# Patient Record
Sex: Male | Born: 1955 | Race: White | Hispanic: No | Marital: Married | Smoking: Former smoker
Health system: Southern US, Community
[De-identification: ages and names within clinical notes are randomized; demographics above are authoritative.]

## PROBLEM LIST (undated history)

## (undated) DIAGNOSIS — N2889 Other specified disorders of kidney and ureter: Secondary | ICD-10-CM

## (undated) DIAGNOSIS — C801 Malignant (primary) neoplasm, unspecified: Secondary | ICD-10-CM

## (undated) DIAGNOSIS — Z8739 Personal history of other diseases of the musculoskeletal system and connective tissue: Secondary | ICD-10-CM

## (undated) DIAGNOSIS — K5730 Diverticulosis of large intestine without perforation or abscess without bleeding: Secondary | ICD-10-CM

## (undated) DIAGNOSIS — Z9889 Other specified postprocedural states: Secondary | ICD-10-CM

## (undated) DIAGNOSIS — J410 Simple chronic bronchitis: Secondary | ICD-10-CM

## (undated) DIAGNOSIS — R112 Nausea with vomiting, unspecified: Secondary | ICD-10-CM

## (undated) DIAGNOSIS — R300 Dysuria: Secondary | ICD-10-CM

## (undated) DIAGNOSIS — Z973 Presence of spectacles and contact lenses: Secondary | ICD-10-CM

## (undated) DIAGNOSIS — R319 Hematuria, unspecified: Secondary | ICD-10-CM

## (undated) DIAGNOSIS — Z972 Presence of dental prosthetic device (complete) (partial): Secondary | ICD-10-CM

## (undated) HISTORY — PX: WISDOM TOOTH EXTRACTION: SHX21

## (undated) HISTORY — PX: HERNIA REPAIR: SHX51

## (undated) HISTORY — PX: CARPAL TUNNEL RELEASE: SHX101

---

## 1995-07-10 HISTORY — PX: SHOULDER SURGERY: SHX246

## 2006-01-07 ENCOUNTER — Ambulatory Visit (HOSPITAL_COMMUNITY): Admission: RE | Admit: 2006-01-07 | Discharge: 2006-01-07 | Payer: Self-pay | Admitting: Orthopaedic Surgery

## 2007-09-18 ENCOUNTER — Ambulatory Visit (HOSPITAL_COMMUNITY): Admission: RE | Admit: 2007-09-18 | Discharge: 2007-09-18 | Payer: Self-pay | Admitting: Family Medicine

## 2010-11-24 NOTE — Op Note (Signed)
NAME:  Michael Brady, Michael Brady NO.:  [id number]   MEDICAL RECORD NO.:  [id number]          PATIENT TYPE:  AMB   LOCATION:  SDS                          FACILITY:  [facility]   PHYSICIAN:  Mark C. Ophelia Charter, M.D.    DATE OF BIRTH:  May 06, 1956   DATE OF PROCEDURE:  01/07/2006  DATE OF DISCHARGE:                                 OPERATIVE REPORT   PREOPERATIVE DIAGNOSIS:  Left hand crush injury with FPL laceration.   POSTOPERATIVE DIAGNOSIS:  Stenosing tenosynovitis left thumb.   PROCEDURE:  Tenolysis, A1 pulley release, and carpal tunnel release, left  hand.   SURGEON:  Mark C. Ophelia Charter, M.D.   ANESTHESIA:  GOT.   ASSISTANT:  Ninfa Linden, P.A.-C.   TOURNIQUET TIME:  Less than 30 minutes.   This 55 year old male was injured on December 24, 2005, when he had a severe  crush injury and had a fracture of the distal metacarpal, second, involving  the articular surface which was reduced and fixed with percutaneous screw  fixation.  He was sent to the hand therapist for a splint.  It was noted at  that time the patient did not have FPL excursion.  Just presented with the  laceration over the thenar eminence, had cut the FPL somehow outside the  machinery.  He had been examined previously by me but it was not noted that  the FPL ws not working.  Neurovascularly he was intact with intact two-point  sensation and closure was done at an outpatient urgent care who had not  noticed significant bleeding, which should be expected with superficial arch  connection off the radial artery above the FPL tendon due to the location of  the laceration.   The hand was prepped up to the tourniquet in preparation for possible tendon  transfer if needed.  Carpal incision was done first.  Median nerve was  evaluated.  There was a distal branch at the takeoff of the thenar motor  branch.  There was extensive scar tissue present in the canal and FPL was  identified, pulled on what they closed, hemostat  looping around it and  immediately there was excursion at the IP joint of the thumb.  This was  pulled slightly harder, there was some slight triggering or catching  sensation.  Incision was made over the A1 pulley with a V-shaped incision,  and A1 pulley was released.  There was some scar tissue pressure on the  tendon and once this was performed, when the FPL was pulled in the carpal  canal, the thumb would flex at the IP joint and then would extend on its own  without catching or triggering.  There was noted scar tissue around the  index profundus also with the wrist flexed and extended.  It appeared that  the DIP of the index finger remains straight.  This tendon was grasped by  hooking underneath it, the rag now applying tension without crushing or  grabbing the tendon and the DIP joint of the index finger had normal  excursion.  After this was pulled and some scar  tissue was released from the  adjacent tendon, the fingers all had a normal cascade.  The wounds were  irrigated.  The old thenar laceration did not have to be opened.  The wounds  were irrigated and tourniquet deflated.  Hemostasis had been obtained going  in with bipolar cautery and limited bipolar __________ operative field.  Simple nylon sutures were placed in the skin.  Xeroform, Klinger's and a  splint with the wrist in extension.  MCPs at the long and index finger were  flexed at 90 degrees, DIP straight and thumb was left so he could have  excursion.  In the recovery room, the patient was able to demonstrate some  excursion, although the FPL still had some mild difficulty flexing more than  40 degrees.  He appeared to still have some problems with initiation  secondary to the crush.  The patient will be discharged home and followed up  with the hand therapist later this week.      Mark C. Ophelia Charter, M.D.  Electronically Signed     MCY/MEDQ  D:  01/07/2006  T:  01/07/2006  Job:  [id number]

## 2014-06-08 DIAGNOSIS — Z8739 Personal history of other diseases of the musculoskeletal system and connective tissue: Secondary | ICD-10-CM

## 2014-06-08 HISTORY — DX: Personal history of other diseases of the musculoskeletal system and connective tissue: Z87.39

## 2014-06-30 ENCOUNTER — Emergency Department (HOSPITAL_COMMUNITY)
Admission: EM | Admit: 2014-06-30 | Discharge: 2014-07-01 | Disposition: A | Discharge status: Home / Self Care | Payer: 59 | Attending: Emergency Medicine | Admitting: Emergency Medicine

## 2014-06-30 ENCOUNTER — Emergency Department (HOSPITAL_COMMUNITY): Payer: 59

## 2014-06-30 ENCOUNTER — Encounter (HOSPITAL_COMMUNITY): Payer: Self-pay | Admitting: *Deleted

## 2014-06-30 DIAGNOSIS — R52 Pain, unspecified: Secondary | ICD-10-CM

## 2014-06-30 DIAGNOSIS — Y9389 Activity, other specified: Secondary | ICD-10-CM | POA: Insufficient documentation

## 2014-06-30 DIAGNOSIS — Y998 Other external cause status: Secondary | ICD-10-CM | POA: Insufficient documentation

## 2014-06-30 DIAGNOSIS — Y92019 Unspecified place in single-family (private) house as the place of occurrence of the external cause: Secondary | ICD-10-CM | POA: Insufficient documentation

## 2014-06-30 DIAGNOSIS — Z880 Allergy status to penicillin: Secondary | ICD-10-CM | POA: Diagnosis not present

## 2014-06-30 DIAGNOSIS — S4992XA Unspecified injury of left shoulder and upper arm, initial encounter: Secondary | ICD-10-CM | POA: Diagnosis present

## 2014-06-30 DIAGNOSIS — Z791 Long term (current) use of non-steroidal anti-inflammatories (NSAID): Secondary | ICD-10-CM | POA: Diagnosis not present

## 2014-06-30 DIAGNOSIS — S43015A Anterior dislocation of left humerus, initial encounter: Secondary | ICD-10-CM

## 2014-06-30 DIAGNOSIS — S43016A Anterior dislocation of unspecified humerus, initial encounter: Secondary | ICD-10-CM | POA: Insufficient documentation

## 2014-06-30 DIAGNOSIS — Z720 Tobacco use: Secondary | ICD-10-CM | POA: Insufficient documentation

## 2014-06-30 DIAGNOSIS — W19XXXA Unspecified fall, initial encounter: Secondary | ICD-10-CM

## 2014-06-30 DIAGNOSIS — W108XXA Fall (on) (from) other stairs and steps, initial encounter: Secondary | ICD-10-CM | POA: Insufficient documentation

## 2014-06-30 LAB — CBC WITH DIFFERENTIAL/PLATELET
Basophils Absolute: 0 10*3/uL (ref 0.0–0.1)
Basophils Relative: 0 % (ref 0–1)
EOS PCT: 1 % (ref 0–5)
Eosinophils Absolute: 0.1 10*3/uL (ref 0.0–0.7)
HEMATOCRIT: 45.4 % (ref 39.0–52.0)
HEMOGLOBIN: 15.1 g/dL (ref 13.0–17.0)
LYMPHS ABS: 3.9 10*3/uL (ref 0.7–4.0)
LYMPHS PCT: 36 % (ref 12–46)
MCH: 31 pg (ref 26.0–34.0)
MCHC: 33.3 g/dL (ref 30.0–36.0)
MCV: 93.2 fL (ref 78.0–100.0)
MONO ABS: 0.9 10*3/uL (ref 0.1–1.0)
Monocytes Relative: 8 % (ref 3–12)
Neutro Abs: 5.8 10*3/uL (ref 1.7–7.7)
Neutrophils Relative %: 55 % (ref 43–77)
Platelets: 274 10*3/uL (ref 150–400)
RBC: 4.87 MIL/uL (ref 4.22–5.81)
RDW: 13.2 % (ref 11.5–15.5)
WBC: 10.7 10*3/uL — AB (ref 4.0–10.5)

## 2014-06-30 LAB — BASIC METABOLIC PANEL
Anion gap: 7 (ref 5–15)
BUN: 18 mg/dL (ref 6–23)
CALCIUM: 9.1 mg/dL (ref 8.4–10.5)
CO2: 29 mmol/L (ref 19–32)
CREATININE: 1.12 mg/dL (ref 0.50–1.35)
Chloride: 101 mEq/L (ref 96–112)
GFR calc Af Amer: 83 mL/min — ABNORMAL LOW (ref >90)
GFR calc non Af Amer: 72 mL/min — ABNORMAL LOW (ref >90)
GLUCOSE: 176 mg/dL — AB (ref 70–99)
Potassium: 3.9 mmol/L (ref 3.5–5.1)
SODIUM: 137 mmol/L (ref 135–145)

## 2014-06-30 MED ORDER — SODIUM CHLORIDE 0.9 % IV BOLUS (SEPSIS)
500.0000 mL | Freq: Once | INTRAVENOUS | Status: AC
  Administered 2014-06-30: 500 mL via INTRAVENOUS

## 2014-06-30 MED ORDER — FENTANYL CITRATE 0.05 MG/ML IJ SOLN
50.0000 ug | Freq: Once | INTRAMUSCULAR | Status: AC
  Administered 2014-06-30: 50 ug via INTRAVENOUS

## 2014-06-30 MED ORDER — OXYCODONE-ACETAMINOPHEN 5-325 MG PO TABS: 1.0000 | ORAL_TABLET | ORAL | Status: DC | PRN

## 2014-06-30 MED ORDER — FENTANYL CITRATE 0.05 MG/ML IJ SOLN
50.0000 ug | Freq: Once | INTRAMUSCULAR | Status: AC
  Administered 2014-06-30: 50 ug via INTRAVENOUS
  Filled 2014-06-30: qty 2

## 2014-06-30 MED ORDER — ONDANSETRON HCL 4 MG/2ML IJ SOLN
4.0000 mg | Freq: Once | INTRAMUSCULAR | Status: AC
  Administered 2014-06-30: 4 mg via INTRAMUSCULAR
  Filled 2014-06-30: qty 2

## 2014-06-30 MED ORDER — MIDAZOLAM HCL 5 MG/5ML IJ SOLN
4.0000 mg | Freq: Once | INTRAMUSCULAR | Status: AC
  Administered 2014-06-30: 4 mg via INTRAVENOUS
  Filled 2014-06-30: qty 5

## 2014-06-30 MED ORDER — PROPOFOL 10 MG/ML IV BOLUS
1.0000 mg/kg | Freq: Once | INTRAVENOUS | Status: AC
  Administered 2014-06-30: 42 mg via INTRAVENOUS

## 2014-06-30 MED ORDER — HYDROMORPHONE HCL 1 MG/ML IJ SOLN
1.0000 mg | Freq: Once | INTRAMUSCULAR | Status: AC
  Administered 2014-06-30 (×2): 1 mg via INTRAVENOUS

## 2014-06-30 MED ORDER — PROPOFOL 10 MG/ML IV BOLUS
INTRAVENOUS | Status: AC | PRN
  Administered 2014-06-30: 42 ug via INTRAVENOUS

## 2014-06-30 MED ORDER — HYDROMORPHONE HCL 1 MG/ML IJ SOLN
INTRAMUSCULAR | Status: AC
  Administered 2014-06-30: 1 mg via INTRAVENOUS
  Filled 2014-06-30: qty 1

## 2014-06-30 MED ORDER — FENTANYL CITRATE 0.05 MG/ML IJ SOLN
INTRAMUSCULAR | Status: AC
  Administered 2014-06-30: 50 ug via INTRAVENOUS
  Filled 2014-06-30: qty 2

## 2014-06-30 MED ORDER — HYDROMORPHONE HCL 1 MG/ML IJ SOLN
1.0000 mg | Freq: Once | INTRAMUSCULAR | Status: DC
  Filled 2014-06-30: qty 1

## 2014-06-30 MED ORDER — PROPOFOL 10 MG/ML IV BOLUS
0.5000 mg/kg | Freq: Once | INTRAVENOUS | Status: DC
  Filled 2014-06-30: qty 1

## 2014-06-30 MED ORDER — SODIUM CHLORIDE 0.9 % IV SOLN
INTRAVENOUS | Status: DC
  Administered 2014-06-30: 19:00:00 via INTRAVENOUS

## 2014-06-30 NOTE — Discharge Instructions (Signed)
Keep sling on  Remove sling for sling but keep arm close to body

## 2014-06-30 NOTE — ED Notes (Signed)
Pt slipped on steps today and fell, landed left shoulder, left shoulder deformity noted

## 2014-06-30 NOTE — ED Notes (Signed)
Pt. Alert. Updated pt. No distress noted.

## 2014-06-30 NOTE — Consult Note (Signed)
Reason for Consult: Unreducible left shoulder dislocation Referring Physician: Dr. Janeal Holmes is an 58 y.o. male.  HPI: This 58 year old male with no previous history of shoulder problems on the left side fell injured his left shoulder with or dislocation. He had a closed reduction under conscious sedation in the emergency room but was unsuccessful. He complained of some numbness in his fingers initially but that resolved. Just complains of pain when I saw him and deformity.  Review of systems unremarkable  Note right shoulder history of dislocation as a teenager which was followed by electrocution and subsequent need for surgery for stabilization    History reviewed. No pertinent past medical history.  Past Surgical History  Procedure Laterality Date  . Shoulder surgery      History reviewed. No pertinent family history.  Social History:  reports that he has been smoking Cigarettes.  He has been smoking about 0.00 packs per day. He does not have any smokeless tobacco history on file. He reports that he does not drink alcohol or use illicit drugs.  Allergies:  Allergies  Allergen Reactions  . Penicillins     Medications: I have reviewed the patient's current medications.  Results for orders placed or performed during the hospital encounter of 06/30/14 (from the past 48 hour(s))  CBC with Differential     Status: Abnormal   Collection Time: 06/30/14  6:48 PM  Result Value Ref Range   WBC 10.7 (H) 4.0 - 10.5 K/uL   RBC 4.87 4.22 - 5.81 MIL/uL   Hemoglobin 15.1 13.0 - 17.0 g/dL   HCT 45.4 39.0 - 52.0 %   MCV 93.2 78.0 - 100.0 fL   MCH 31.0 26.0 - 34.0 pg   MCHC 33.3 30.0 - 36.0 g/dL   RDW 13.2 11.5 - 15.5 %   Platelets 274 150 - 400 K/uL   Neutrophils Relative % 55 43 - 77 %   Neutro Abs 5.8 1.7 - 7.7 K/uL   Lymphocytes Relative 36 12 - 46 %   Lymphs Abs 3.9 0.7 - 4.0 K/uL   Monocytes Relative 8 3 - 12 %   Monocytes Absolute 0.9 0.1 - 1.0 K/uL   Eosinophils  Relative 1 0 - 5 %   Eosinophils Absolute 0.1 0.0 - 0.7 K/uL   Basophils Relative 0 0 - 1 %   Basophils Absolute 0.0 0.0 - 0.1 K/uL  Basic metabolic panel     Status: Abnormal   Collection Time: 06/30/14  6:48 PM  Result Value Ref Range   Sodium 137 135 - 145 mmol/L    Comment: Please note change in reference range.   Potassium 3.9 3.5 - 5.1 mmol/L    Comment: Please note change in reference range.   Chloride 101 96 - 112 mEq/L   CO2 29 19 - 32 mmol/L   Glucose, Bld 176 (H) 70 - 99 mg/dL   BUN 18 6 - 23 mg/dL   Creatinine, Ser 1.12 0.50 - 1.35 mg/dL   Calcium 9.1 8.4 - 10.5 mg/dL   GFR calc non Af Amer 72 (L) >90 mL/min   GFR calc Af Amer 83 (L) >90 mL/min    Comment: (NOTE) The eGFR has been calculated using the CKD EPI equation. This calculation has not been validated in all clinical situations. eGFR's persistently <90 mL/min signify possible Chronic Kidney Disease.    Anion gap 7 5 - 15    Dg Shoulder Left  06/30/2014   CLINICAL DATA:  [address]  downstairs coming out of the house. Unable to elevate arm.  EXAM: LEFT SHOULDER - 2+ VIEW  COMPARISON:  None.  FINDINGS: Anterior inferior LEFT shoulder dislocation. No fracture deformity. No destructive bony lesions. Soft tissue planes are nonsuspicious.  IMPRESSION: LEFT anterior inferior shoulder dislocation without fracture deformity.   Electronically Signed   By: Elon Alas   On: 06/30/2014 18:31    ROS Blood pressure 134/83, pulse 91, temperature 98.2 F (36.8 C), temperature source Oral, resp. rate 16, height '5\' 9"'  (1.753 m), weight 185 lb (83.915 kg), SpO2 96 %. Physical Exam He is awake alert and oriented 3 most of the sedative medicine had worn off and I saw him. His general appearance with normal. His grooming and hygiene were normal.  He was not ambulatory but that was because of his injury.  He had no neurovascular deficits. Lymph nodes were negative in the cervical and axillary regions.  His lower extremities  were normal. His right upper extremity was normal.  In deformity of the left shoulder painful range of motion neurovascular exam again intact muscle tone normal.   Assessment/Plan: Left shoulder dislocation   Closed reduction under sedation   F/u Monday 28th at [place] 06/30/2014, 9:57 PM

## 2014-06-30 NOTE — ED Provider Notes (Signed)
CSN: [id number]     Arrival date & time 06/30/14  1738 History   First MD Initiated Contact with Patient 06/30/14 1753     Chief Complaint  Patient presents with  . Shoulder Injury     (Consider location/radiation/quality/duration/timing/severity/associated sxs/prior Treatment) Patient is a 58 y.o. male presenting with shoulder injury. The history is provided by the patient.  Shoulder Injury Pertinent negatives include no chest pain, no abdominal pain, no headaches and no shortness of breath.   patient with a fall outside on some steps. The landed on left shoulder instant pain and deformity to left shoulder. Patient's had a history of right shoulders locations in the past his pressure is dislocated. Pain is 10 out of 10. Did no other injuries. No loss of consciousness. Pain is sharp and aching nature. No numbness to his arm.  History reviewed. No pertinent past medical history. Past Surgical History  Procedure Laterality Date  . Shoulder surgery     History reviewed. No pertinent family history. History  Substance Use Topics  . Smoking status: Current Every Day Smoker    Types: Cigarettes  . Smokeless tobacco: Not on file  . Alcohol Use: No    Review of Systems  Constitutional: Negative for fever.  HENT: Negative for congestion.   Eyes: Negative for visual disturbance.  Respiratory: Negative for shortness of breath.   Cardiovascular: Negative for chest pain.  Gastrointestinal: Negative for abdominal pain.  Genitourinary: Negative for dysuria.  Musculoskeletal: Negative for back pain.  Skin: Negative for rash.  Neurological: Negative for headaches.  Hematological: Does not bruise/bleed easily.  Psychiatric/Behavioral: Negative for confusion.      Allergies  Penicillins  Home Medications   Prior to Admission medications   Medication Sig Start Date End Date Taking? Authorizing Provider  Multiple Vitamins-Minerals (MENS MULTIVITAMIN PLUS PO) Take 1 tablet by mouth  daily.   Yes Historical Provider, MD  naproxen sodium (ALEVE) 220 MG tablet Take 220 mg by mouth daily.   Yes Historical Provider, MD  oxyCODONE-acetaminophen (ROXICET) 5-325 MG per tablet Take 1 tablet by mouth every 4 (four) hours as needed for severe pain. 06/30/14   Carole Civil, MD   BP 134/83 mmHg  Pulse 91  Temp(Src) 98.2 F (36.8 C) (Oral)  Resp 16  Ht 5\' 9"  (1.753 m)  Wt 185 lb (83.915 kg)  BMI 27.31 kg/m2  SpO2 96% Physical Exam  Constitutional: He is oriented to person, place, and time. He appears well-developed and well-nourished. He appears distressed.  HENT:  Head: Normocephalic and atraumatic.  Eyes: Conjunctivae and EOM are normal. Pupils are equal, round, and reactive to light.  Neck: Normal range of motion.  Cardiovascular: Normal rate, regular rhythm and normal heart sounds.   No murmur heard. Pulmonary/Chest: Effort normal and breath sounds normal. No respiratory distress.  Abdominal: Soft. Bowel sounds are normal. There is no tenderness.  Musculoskeletal: He exhibits tenderness.  Deformity to the left shoulder no numbness distally no numbness to the lateral aspect of the arm. Good radial pulse 2+. Sensation the fingers intact. Good movement of fingers and elbow but the pain with movement at the shoulder and obvious deformity.  Neurological: He is alert and oriented to person, place, and time. No cranial nerve deficit. He exhibits normal muscle tone. Coordination normal.  Skin: Skin is warm. No rash noted.  Nursing note and vitals reviewed.   ED Course  Procedures (including critical care time) Labs Review Labs Reviewed  CBC WITH DIFFERENTIAL - Abnormal; Notable  for the following:    WBC 10.7 (*)    All other components within normal limits  BASIC METABOLIC PANEL - Abnormal; Notable for the following:    Glucose, Bld 176 (*)    GFR calc non Af Amer 72 (*)    GFR calc Af Amer 83 (*)    All other components within normal limits   Results for orders  placed or performed during the hospital encounter of 06/30/14  CBC with Differential  Result Value Ref Range   WBC 10.7 (H) 4.0 - 10.5 K/uL   RBC 4.87 4.22 - 5.81 MIL/uL   Hemoglobin 15.1 13.0 - 17.0 g/dL   HCT 45.4 39.0 - 52.0 %   MCV 93.2 78.0 - 100.0 fL   MCH 31.0 26.0 - 34.0 pg   MCHC 33.3 30.0 - 36.0 g/dL   RDW 13.2 11.5 - 15.5 %   Platelets 274 150 - 400 K/uL   Neutrophils Relative % 55 43 - 77 %   Neutro Abs 5.8 1.7 - 7.7 K/uL   Lymphocytes Relative 36 12 - 46 %   Lymphs Abs 3.9 0.7 - 4.0 K/uL   Monocytes Relative 8 3 - 12 %   Monocytes Absolute 0.9 0.1 - 1.0 K/uL   Eosinophils Relative 1 0 - 5 %   Eosinophils Absolute 0.1 0.0 - 0.7 K/uL   Basophils Relative 0 0 - 1 %   Basophils Absolute 0.0 0.0 - 0.1 K/uL  Basic metabolic panel  Result Value Ref Range   Sodium 137 135 - 145 mmol/L   Potassium 3.9 3.5 - 5.1 mmol/L   Chloride 101 96 - 112 mEq/L   CO2 29 19 - 32 mmol/L   Glucose, Bld 176 (H) 70 - 99 mg/dL   BUN 18 6 - 23 mg/dL   Creatinine, Ser 1.12 0.50 - 1.35 mg/dL   Calcium 9.1 8.4 - 10.5 mg/dL   GFR calc non Af Amer 72 (L) >90 mL/min   GFR calc Af Amer 83 (L) >90 mL/min   Anion gap 7 5 - 15     Imaging Review Dg Shoulder Left  06/30/2014   CLINICAL DATA:  [address] downstairs coming out of the house. Unable to elevate arm.  EXAM: LEFT SHOULDER - 2+ VIEW  COMPARISON:  None.  FINDINGS: Anterior inferior LEFT shoulder dislocation. No fracture deformity. No destructive bony lesions. Soft tissue planes are nonsuspicious.  IMPRESSION: LEFT anterior inferior shoulder dislocation without fracture deformity.   Electronically Signed   By: Elon Alas   On: 06/30/2014 18:31     EKG Interpretation   Date/Time:  Wednesday June 30 2014 18:57:45 EST Ventricular Rate:  64 PR Interval:  159 QRS Duration: 75 QT Interval:  392 QTC Calculation: 404 R Axis:   32 Text Interpretation:  Sinus rhythm No previous ECGs available Confirmed by  Falon Flinchum  MD, Khaden Gater [phone]) on  06/30/2014 7:11:19 PM       Reduction of dislocation Date/Time: 10:14 PM Performed by: Fredia Sorrow Authorized by: Fredia Sorrow Consent: Verbal consent obtained. Risks and benefits: risks, benefits and alternatives were discussed Consent given by: patient Required items: required blood products, implants, devices, and special equipment available Time out: Immediately prior to procedure a "time out" was called to verify the correct patient, procedure, equipment, support staff and site/side marked as required.  Patient sedated: with propofol.   Vitals: Vital signs were monitored during sedation. Patient tolerance: Patient tolerated the procedure well with no immediate complications. Joint: left shoulder Reduction technique:  reduction was not successful. Plan to contact orthopedics we will redo conscious sedation with me doing the conscious sedation part and have orthopedics do the reduction.     Procedural sedation Performed by: Fredia Sorrow Consent: Verbal consent obtained. Risks and benefits: risks, benefits and alternatives were discussed Required items: required blood products, implants, devices, and special equipment available Patient identity confirmed: arm band and provided demographic data Time out: Immediately prior to procedure a "time out" was called to verify the correct patient, procedure, equipment, support staff and site/side marked as required.  Sedation type: moderate (conscious) sedation NPO time confirmed and considedered  Sedatives: PROPOFOL  Physician Time at Bedside: 45  Vitals: Vital signs were monitored during sedation. Cardiac Monitor, pulse oximeter Patient tolerance: PatieAs stated below procedure sedation was done with 2 rounds. Total bedside time was 45 minutes. Initially patient received 84 mg total of propofol. Bed with a reduction of the shoulder was not successful. Patient was then allowed to recover. He returned back to baseline. And  then patient was given a total of 2 mg of hydromorphone and 4 mg of Versed and then 42 mg of propofol became very relaxed and the reduction of the left shoulder by orthopedics was done with with these. Patient tolerated the procedure fine.nt tolerated the procedure well with no immediate complications. Comments: Pt with uneventful recovered. Returned to pre-procedural sedation baseline     MDM   Final diagnoses:  Pain  Anterior shoulder dislocation, left, initial encounter    Patient with left shoulder dislocation anterior inferior. Initial try with propofol conscious sedation after having fentanyl onboard without any success patient wasn't very relaxed. At one point it almost went in but didn't stay in. Called the orthopedic surgery Dr. Aline Brochure we redid the conscious sedation this time gave for Versed he had a total of 2 of hydromorphone. And then he got just of 42 mg of propofol. Patient became extremely relaxed shoulder went in without any difficulty. Patient never dropped his oxygen saturations. He was on of 4 L nasal cannula oxygen. Blood pressure never dropped. Patient was placed in a sling. Follow-up with Dr. Aline Brochure from orthopedics arranged.    Fredia Sorrow, MD 06/30/14 2215

## 2014-06-30 NOTE — ED Notes (Signed)
Left hand warm and radial pulses stong. Ice applied to shoulder

## 2014-06-30 NOTE — ED Notes (Signed)
Pt. Moved from 4L to 2L O2. Will continue to monitor pt.

## 2014-07-01 NOTE — ED Notes (Signed)
Pt. Placed on room air. Will continue to monitor.

## 2014-07-05 ENCOUNTER — Ambulatory Visit: Payer: 59 | Admitting: Orthopedic Surgery

## 2014-07-08 ENCOUNTER — Ambulatory Visit (INDEPENDENT_AMBULATORY_CARE_PROVIDER_SITE_OTHER): Payer: Self-pay | Admitting: Orthopedic Surgery

## 2014-07-08 VITALS — BP 132/93 | Ht 69.0 in | Wt 185.0 lb

## 2014-07-08 DIAGNOSIS — M5489 Other dorsalgia: Secondary | ICD-10-CM

## 2014-07-08 DIAGNOSIS — S43005D Unspecified dislocation of left shoulder joint, subsequent encounter: Secondary | ICD-10-CM

## 2014-07-08 NOTE — Progress Notes (Signed)
Patient ID: Michael Brady, male   DOB: 1956/03/12, 58 y.o.   MRN: [id number] Chief Complaint  Patient presents with  . Follow-up    hosp follow up Left shoulder dislocation, DOI 06/30/14     The patient closed reduction under anesthesia left shoulder for first-time dislocation. Age over 61. Doing well. Complains of some left lower thoracic back pain. No evidence of respiratory distress or shortness of breath.    passive range of motion of the shoulder normal clinically shoulder remains reduced  Neurovascular exam intact   recommend Codman exercises. He can remove his sling. Low risk of dislocating again.  Chance of rotator cuff tear. Patient made aware. Recheck for rotator cuff injury 4 weeks.

## 2014-08-05 ENCOUNTER — Ambulatory Visit (INDEPENDENT_AMBULATORY_CARE_PROVIDER_SITE_OTHER): Payer: Self-pay | Admitting: Orthopedic Surgery

## 2014-08-05 ENCOUNTER — Encounter: Payer: Self-pay | Admitting: Orthopedic Surgery

## 2014-08-05 DIAGNOSIS — S43005D Unspecified dislocation of left shoulder joint, subsequent encounter: Secondary | ICD-10-CM

## 2014-08-05 MED ORDER — HYDROCODONE-ACETAMINOPHEN 5-325 MG PO TABS: 1.0000 | ORAL_TABLET | Freq: Four times a day (QID) | ORAL | Status: DC | PRN

## 2014-08-05 NOTE — Progress Notes (Signed)
Patient ID: Michael Brady, male   DOB: 30-Mar-1956, 59 y.o.   MRN: [id number] Recheck left shoulder dislocation  hosp follow up Left shoulder dislocation, DOI 06/30/14      The patient closed reduction under anesthesia left shoulder for first-time dislocation. Age over 72. Doing well. Complains of some left lower thoracic back pain. No evidence of respiratory distress or shortness of breath.    The patient notes he was placing his arm behind him about 2 days ago and felt something give in the shoulder and then developed a bruise over the proximal humeral area but he's had a previous biceps tendon rupture so we know that that has already been done and this is something new perhaps coracobrachialis. He has a bruise over the skin in that area.  No other complaints other than mild shoulder discomfort  At this point his exam shows he stable in abduction external rotation his rotator cuff strength is equal to his opposite shoulder his passive range of motion is normal he's neurovascularly intact skin with bruises as stated.  Shoulder dislocation no rotator cuff tear clinically  Follow-up as needed  Okay to refill Norco for last time. If he needs more medication Tylenol or Advil.  Meds ordered this encounter  Medications  . HYDROcodone-acetaminophen (NORCO/VICODIN) 5-325 MG per tablet    Sig: Take 1 tablet by mouth every 6 (six) hours as needed for moderate pain.    Dispense:  60 tablet    Refill:  0

## 2017-03-14 ENCOUNTER — Telehealth: Payer: Self-pay | Admitting: Family Medicine

## 2017-03-14 DIAGNOSIS — Z0000 Encounter for general adult medical examination without abnormal findings: Secondary | ICD-10-CM

## 2017-03-14 NOTE — Telephone Encounter (Signed)
Last labs 06/2014 bmp, cbc

## 2017-03-14 NOTE — Telephone Encounter (Signed)
CBC, lipid, liver, metabolic 7, PSA

## 2017-03-14 NOTE — Telephone Encounter (Signed)
Patient has physical on 9/26 and needing labs done. [phone]

## 2017-03-15 NOTE — Addendum Note (Signed)
Addended by: Ofilia Neas R on: 03/15/2017 10:28 AM   Modules accepted: Orders

## 2017-03-15 NOTE — Telephone Encounter (Signed)
Spoke with patient and informed him that labs have been ordered. Patient verbalized understanding.

## 2017-04-03 ENCOUNTER — Encounter: Payer: Self-pay | Admitting: Family Medicine

## 2017-04-03 ENCOUNTER — Ambulatory Visit (INDEPENDENT_AMBULATORY_CARE_PROVIDER_SITE_OTHER): Payer: 59 | Admitting: Family Medicine

## 2017-04-03 VITALS — BP 124/80 | Ht 66.5 in | Wt 172.0 lb

## 2017-04-03 DIAGNOSIS — E785 Hyperlipidemia, unspecified: Secondary | ICD-10-CM | POA: Insufficient documentation

## 2017-04-03 DIAGNOSIS — E875 Hyperkalemia: Secondary | ICD-10-CM | POA: Diagnosis not present

## 2017-04-03 DIAGNOSIS — Z0000 Encounter for general adult medical examination without abnormal findings: Secondary | ICD-10-CM

## 2017-04-03 DIAGNOSIS — Z1211 Encounter for screening for malignant neoplasm of colon: Secondary | ICD-10-CM | POA: Diagnosis not present

## 2017-04-03 LAB — CBC WITH DIFFERENTIAL/PLATELET
BASOS: 0 %
Basophils Absolute: 0 10*3/uL (ref 0.0–0.2)
EOS (ABSOLUTE): 0.1 10*3/uL (ref 0.0–0.4)
EOS: 1 %
HEMOGLOBIN: 16.9 g/dL (ref 13.0–17.7)
Hematocrit: 50.1 % (ref 37.5–51.0)
IMMATURE GRANS (ABS): 0 10*3/uL (ref 0.0–0.1)
Immature Granulocytes: 0 %
LYMPHS: 28 %
Lymphocytes Absolute: 2 10*3/uL (ref 0.7–3.1)
MCH: 31 pg (ref 26.6–33.0)
MCHC: 33.7 g/dL (ref 31.5–35.7)
MCV: 92 fL (ref 79–97)
MONOCYTES: 7 %
Monocytes Absolute: 0.5 10*3/uL (ref 0.1–0.9)
NEUTROS ABS: 4.7 10*3/uL (ref 1.4–7.0)
Neutrophils: 64 %
Platelets: 246 10*3/uL (ref 150–379)
RBC: 5.45 x10E6/uL (ref 4.14–5.80)
RDW: 13.2 % (ref 12.3–15.4)
WBC: 7.3 10*3/uL (ref 3.4–10.8)

## 2017-04-03 LAB — BASIC METABOLIC PANEL
BUN/Creatinine Ratio: 22 — ABNORMAL HIGH (ref 9–20)
BUN: 21 mg/dL (ref 6–24)
CALCIUM: 10 mg/dL (ref 8.7–10.2)
CO2: 26 mmol/L (ref 20–29)
Chloride: 100 mmol/L (ref 96–106)
Creatinine, Ser: 0.94 mg/dL (ref 0.76–1.27)
GFR, EST AFRICAN AMERICAN: 102 mL/min/{1.73_m2} (ref >59)
GFR, EST NON AFRICAN AMERICAN: 88 mL/min/{1.73_m2} (ref >59)
GLUCOSE: 94 mg/dL (ref 65–99)
POTASSIUM: 5.9 mmol/L — AB (ref 3.5–5.2)
SODIUM: 140 mmol/L (ref 134–144)

## 2017-04-03 LAB — HEPATIC FUNCTION PANEL
ALBUMIN: 5 g/dL (ref 3.5–5.5)
ALT: 16 IU/L (ref 0–44)
AST: 22 IU/L (ref 0–40)
Alkaline Phosphatase: 78 IU/L (ref 39–117)
Bilirubin Total: 0.4 mg/dL (ref 0.0–1.2)
Bilirubin, Direct: 0.11 mg/dL (ref 0.00–0.40)
TOTAL PROTEIN: 7.2 g/dL (ref 6.0–8.5)

## 2017-04-03 LAB — LIPID PANEL
Chol/HDL Ratio: 5.3 ratio — ABNORMAL HIGH (ref 0.0–5.0)
Cholesterol, Total: 232 mg/dL — ABNORMAL HIGH (ref 100–199)
HDL: 44 mg/dL (ref >39)
LDL CALC: 150 mg/dL — AB (ref 0–99)
Triglycerides: 190 mg/dL — ABNORMAL HIGH (ref 0–149)
VLDL CHOLESTEROL CAL: 38 mg/dL (ref 5–40)

## 2017-04-03 LAB — PSA: Prostate Specific Ag, Serum: 0.9 ng/mL (ref 0.0–4.0)

## 2017-04-03 MED ORDER — TRIAMCINOLONE ACETONIDE 0.1 % EX CREA: 1.0000 "application " | TOPICAL_CREAM | Freq: Two times a day (BID) | CUTANEOUS | 0 refills | Status: DC

## 2017-04-03 NOTE — Progress Notes (Signed)
Subjective:    Patient ID: Michael Brady, male    DOB: [birth year], 61 y.o.   MRN: [id number]  HPI The patient comes in today for a wellness visit.    A review of their health history was completed.  A review of medications was also completed.  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   Additional concerns: poison ivy on right hand and arm/  Smokes a pack per day  Results for orders placed or performed in visit on 03/14/17  CBC with Differential/Platelet  Result Value Ref Range   WBC 7.3 3.4 - 10.8 x10E3/uL   RBC 5.45 4.14 - 5.80 x10E6/uL   Hemoglobin 16.9 13.0 - 17.7 g/dL   Hematocrit 50.1 37.5 - 51.0 %   MCV 92 79 - 97 fL   MCH 31.0 26.6 - 33.0 pg   MCHC 33.7 31.5 - 35.7 g/dL   RDW 13.2 12.3 - 15.4 %   Platelets 246 150 - 379 x10E3/uL   Neutrophils 64 Not Estab. %   Lymphs 28 Not Estab. %   Monocytes 7 Not Estab. %   Eos 1 Not Estab. %   Basos 0 Not Estab. %   Neutrophils Absolute 4.7 1.4 - 7.0 x10E3/uL   Lymphocytes Absolute 2.0 0.7 - 3.1 x10E3/uL   Monocytes Absolute 0.5 0.1 - 0.9 x10E3/uL   EOS (ABSOLUTE) 0.1 0.0 - 0.4 x10E3/uL   Basophils Absolute 0.0 0.0 - 0.2 x10E3/uL   Immature Granulocytes 0 Not Estab. %   Immature Grans (Abs) 0.0 0.0 - 0.1 x10E3/uL  Lipid panel  Result Value Ref Range   Cholesterol, Total 232 (H) 100 - 199 mg/dL   Triglycerides 190 (H) 0 - 149 mg/dL   HDL 44 >39 mg/dL   VLDL Cholesterol Cal 38 5 - 40 mg/dL   LDL Calculated 150 (H) 0 - 99 mg/dL   Chol/HDL Ratio 5.3 (H) 0.0 - 5.0 ratio  Hepatic function panel  Result Value Ref Range   Total Protein 7.2 6.0 - 8.5 g/dL   Albumin 5.0 3.5 - 5.5 g/dL   Bilirubin Total 0.4 0.0 - 1.2 mg/dL   Bilirubin, Direct 0.11 0.00 - 0.40 mg/dL   Alkaline Phosphatase 78 39 - 117 IU/L   AST 22 0 - 40 IU/L   ALT 16 0 - 44 IU/L  Basic metabolic panel  Result Value Ref Range   Glucose 94 65 - 99 mg/dL   BUN 21 6 - 24 mg/dL   Creatinine, Ser 0.94 0.76 - 1.27 mg/dL   GFR calc non Af Amer 88 >59 mL/min/1.73   GFR calc Af Amer 102 >59 mL/min/1.73   BUN/Creatinine Ratio 22 (H) 9 - 20   Sodium 140 134 - 144 mmol/L   Potassium 5.9 (H) 3.5 - 5.2 mmol/L   Chloride 100 96 - 106 mmol/L   CO2 26 20 - 29 mmol/L   Calcium 10.0 8.7 - 10.2 mg/dL  PSA  Result Value Ref Range   Prostate Specific Ag, Serum 0.9 0.0 - 4.0 ng/mL   faather had prostate cancer in his 3s  Declines flu shot     No hx of recent meds for anything   Pt stays active and on the move, working a lot   Pt tries to eat the right  thing, grills     Declined flu vaccine.    Review of Systems  Constitutional: Negative for activity change, appetite change and fever.  HENT: Negative for congestion and rhinorrhea.   Eyes: Negative for discharge.  Respiratory: Negative for cough and wheezing.   Cardiovascular: Negative for chest pain.  Gastrointestinal: Negative for abdominal pain, blood in stool and vomiting.  Genitourinary: Negative for difficulty urinating and frequency.  Musculoskeletal: Negative for neck pain.  Skin: Negative for rash.  Allergic/Immunologic: Negative for environmental allergies and food allergies.  Neurological: Negative for weakness and headaches.  Psychiatric/Behavioral: Negative for agitation.  All other systems reviewed and are negative.      Objective:   Physical Exam  Constitutional: He appears well-developed and well-nourished.  HENT:  Head: Normocephalic and atraumatic.  Right Ear: External ear normal.  Left Ear: External ear normal.  Nose: Nose normal.  Mouth/Throat: Oropharynx is clear and moist.  Eyes: Pupils are equal, round, and reactive to light. EOM are normal.  Neck: Normal range of motion. Neck supple. No thyromegaly present.  Cardiovascular: Normal rate,  regular rhythm and normal heart sounds.   No murmur heard. Pulmonary/Chest: Effort normal and breath sounds normal. No respiratory distress. He has no wheezes.  Abdominal: Soft. Bowel sounds are normal. He exhibits no distension and no mass. There is no tenderness.  Genitourinary: Penis normal.  Genitourinary Comments: Prostate gland  Within normal limits  Musculoskeletal: Normal range of motion. He exhibits no edema.  Lymphadenopathy:    He has no cervical adenopathy.  Neurological: He is alert. He exhibits normal muscle tone.  Skin: Skin is warm and dry. No erythema.  Patchy conta dermatitis on both arms  Psychiatric: He has a normal mood and affect. His behavior is normal. Judgment normal.  Vitals reviewed.         Assessment & Plan:  Impression 1 wellness exam. Diet discussed exercise discussed. Colonoscopy due. We'll set up through Dr. Arnoldo Morale per patient request. #2 skin dermatitis #3 chronic smoker strongly encouraged to quit. Blood work reviewed. Elevated cholesterol. Educational information given. Patient to work on diet will check yearly. Potassium up, no note of hemolysis in sample, will repeat.

## 2017-04-03 NOTE — Patient Instructions (Signed)
Results for orders placed or performed in visit on 03/14/17  CBC with Differential/Platelet  Result Value Ref Range   WBC 7.3 3.4 - 10.8 x10E3/uL   RBC 5.45 4.14 - 5.80 x10E6/uL   Hemoglobin 16.9 13.0 - 17.7 g/dL   Hematocrit 50.1 37.5 - 51.0 %   MCV 92 79 - 97 fL   MCH 31.0 26.6 - 33.0 pg   MCHC 33.7 31.5 - 35.7 g/dL   RDW 13.2 12.3 - 15.4 %   Platelets 246 150 - 379 x10E3/uL   Neutrophils 64 Not Estab. %   Lymphs 28 Not Estab. %   Monocytes 7 Not Estab. %   Eos 1 Not Estab. %   Basos 0 Not Estab. %   Neutrophils Absolute 4.7 1.4 - 7.0 x10E3/uL   Lymphocytes Absolute 2.0 0.7 - 3.1 x10E3/uL   Monocytes Absolute 0.5 0.1 - 0.9 x10E3/uL   EOS (ABSOLUTE) 0.1 0.0 - 0.4 x10E3/uL   Basophils Absolute 0.0 0.0 - 0.2 x10E3/uL   Immature Granulocytes 0 Not Estab. %   Immature Grans (Abs) 0.0 0.0 - 0.1 x10E3/uL  Lipid panel  Result Value Ref Range   Cholesterol, Total 232 (H) 100 - 199 mg/dL   Triglycerides 190 (H) 0 - 149 mg/dL   HDL 44 >39 mg/dL   VLDL Cholesterol Cal 38 5 - 40 mg/dL   LDL Calculated 150 (H) 0 - 99 mg/dL   Chol/HDL Ratio 5.3 (H) 0.0 - 5.0 ratio  Hepatic function panel  Result Value Ref Range   Total Protein 7.2 6.0 - 8.5 g/dL   Albumin 5.0 3.5 - 5.5 g/dL   Bilirubin Total 0.4 0.0 - 1.2 mg/dL   Bilirubin, Direct 0.11 0.00 - 0.40 mg/dL   Alkaline Phosphatase 78 39 - 117 IU/L   AST 22 0 - 40 IU/L   ALT 16 0 - 44 IU/L  Basic metabolic panel  Result Value Ref Range   Glucose 94 65 - 99 mg/dL   BUN 21 6 - 24 mg/dL   Creatinine, Ser 0.94 0.76 - 1.27 mg/dL   GFR calc non Af Amer 88 >59 mL/min/1.73   GFR calc Af Amer 102 >59 mL/min/1.73   BUN/Creatinine Ratio 22 (H) 9 - 20   Sodium 140 134 - 144 mmol/L   Potassium 5.9 (H) 3.5 - 5.2 mmol/L   Chloride 100 96 - 106 mmol/L   CO2 26 20 - 29 mmol/L   Calcium 10.0 8.7 - 10.2 mg/dL  PSA  Result Value Ref Range   Prostate Specific Ag, Serum 0.9 0.0 - 4.0 ng/mL    Cholesterol Cholesterol is a white, waxy, fat-like  substance that is needed by the human body in small amounts. The liver makes all the cholesterol we need. Cholesterol is carried from the liver by the blood through the blood vessels. Deposits of cholesterol (plaques) may build up on blood vessel (artery) walls. Plaques make the arteries narrower and stiffer. Cholesterol plaques increase the risk for heart attack and stroke. You cannot feel your cholesterol level even if it is very high. The only way to know that it is high is to have a blood test. Once you know your cholesterol levels, you should keep a record of the test results. Work with your health care provider to keep your levels in the desired range. What do the results mean?  Total cholesterol is a rough measure of all the cholesterol in your blood.  LDL (low-density lipoprotein) is the "bad" cholesterol. This is the  type that causes plaque to build up on the artery walls. You want this level to be low.  HDL (high-density lipoprotein) is the "good" cholesterol because it cleans the arteries and carries the LDL away. You want this level to be high.  Triglycerides are fat that the body can either burn for energy or store. High levels are closely linked to heart disease. What are the desired levels of cholesterol?  Total cholesterol below 200.  LDL below 100 for people who are at risk, below 70 for people at very high risk.  HDL above 40 is good. A level of 60 or higher is considered to be protective against heart disease.  Triglycerides below 150. How can I lower my cholesterol? Diet Follow your diet program as told by your health care provider.  Choose fish or white meat chicken and [place], roasted or baked. Limit fatty cuts of red meat, fried foods, and processed meats, such as sausage and lunch meats.  Eat lots of fresh fruits and vegetables.  Choose whole grains, beans, pasta, potatoes, and cereals.  Choose olive oil, corn oil, or canola oil, and use only small  amounts.  Avoid butter, mayonnaise, shortening, or palm kernel oils.  Avoid foods with trans fats.  Drink skim or nonfat milk and eat low-fat or nonfat yogurt and cheeses. Avoid whole milk, cream, ice cream, egg yolks, and full-fat cheeses.  Healthier desserts include angel food cake, ginger snaps, animal crackers, hard candy, popsicles, and low-fat or nonfat frozen yogurt. Avoid pastries, cakes, pies, and cookies.  Exercise  Follow your exercise program as told by your health care provider. A regular program: ? Helps to decrease LDL and raise HDL. ? Helps with weight control.  Do things that increase your activity level, such as gardening, walking, and taking the stairs.  Ask your health care provider about ways that you can be more active in your daily life.  Medicine  Take over-the-counter and prescription medicines only as told by your health care provider. ? Medicine may be prescribed by your health care provider to help lower cholesterol and decrease the risk for heart disease. This is usually done if diet and exercise have failed to bring down cholesterol levels. ? If you have several risk factors, you may need medicine even if your levels are normal.  This information is not intended to replace advice given to you by your health care provider. Make sure you discuss any questions you have with your health care provider. Document Released: 03/20/2001 Document Revised: 01/21/2016 Document Reviewed: 12/24/2015 Elsevier Interactive Patient Education  2017 [employer].

## 2017-04-03 NOTE — Addendum Note (Signed)
Addended by: Dairl Ponder on: 04/03/2017 03:06 PM   Modules accepted: Orders

## 2017-04-10 LAB — BASIC METABOLIC PANEL
BUN / CREAT RATIO: 17 (ref 9–20)
BUN: 18 mg/dL (ref 6–24)
CO2: 29 mmol/L (ref 20–29)
CREATININE: 1.07 mg/dL (ref 0.76–1.27)
Calcium: 10.3 mg/dL — ABNORMAL HIGH (ref 8.7–10.2)
Chloride: 100 mmol/L (ref 96–106)
GFR, EST AFRICAN AMERICAN: 87 mL/min/{1.73_m2} (ref >59)
GFR, EST NON AFRICAN AMERICAN: 76 mL/min/{1.73_m2} (ref >59)
Glucose: 99 mg/dL (ref 65–99)
POTASSIUM: 5.6 mmol/L — AB (ref 3.5–5.2)
Sodium: 143 mmol/L (ref 134–144)

## 2017-05-02 ENCOUNTER — Ambulatory Visit (INDEPENDENT_AMBULATORY_CARE_PROVIDER_SITE_OTHER): Payer: 59 | Admitting: General Surgery

## 2017-05-02 ENCOUNTER — Encounter: Payer: Self-pay | Admitting: General Surgery

## 2017-05-02 VITALS — BP 167/92 | HR 61 | Temp 98.6°F | Resp 18 | Ht 68.0 in | Wt 174.0 lb

## 2017-05-02 DIAGNOSIS — Z1211 Encounter for screening for malignant neoplasm of colon: Secondary | ICD-10-CM | POA: Diagnosis not present

## 2017-05-02 MED ORDER — PEG 3350-KCL-NABCB-NACL-NASULF 236 G PO SOLR: 4000.0000 mL | Freq: Once | ORAL | 0 refills | Status: AC

## 2017-05-02 NOTE — H&P (Signed)
Michael Brady; [id number]; 09/11/1955   HPI Patient is a 61 year old white male who is referred to my care by Dr. Mickie Hillier for screening colonoscopy. Patient has never had a colonoscopy. He denies any family history of colon cancer. He denies any abdominal pain, weight loss, blood in stools, diarrhea, or constipation. He currently has no pain. No past medical history on file.  Past Surgical History:  Procedure Laterality Date  . SHOULDER SURGERY      No family history on file.  Current Outpatient Prescriptions on File Prior to Visit  Medication Sig Dispense Refill  . Multiple Vitamins-Minerals (MENS MULTIVITAMIN PLUS PO) Take 1 tablet by mouth daily.    Marland Kitchen triamcinolone cream (KENALOG) 0.1 % Apply 1 application topically 2 (two) times daily. (Patient not taking: Reported on 05/02/2017) 30 g 0   No current facility-administered medications on file prior to visit.     Allergies  Allergen Reactions  . Penicillins Swelling    History  Alcohol Use No    History  Smoking Status  . Current Every Day Smoker  . Types: Cigarettes  Smokeless Tobacco  . Never Used    Review of Systems  Constitutional: Negative.   HENT: Positive for sinus pain.   Eyes: Negative.   Respiratory: Positive for cough.   Cardiovascular: Negative.   Gastrointestinal: Negative.   Genitourinary: Negative.   Musculoskeletal: Negative.   Skin: Negative.   Neurological: Negative.   Endo/Heme/Allergies: Negative.   Psychiatric/Behavioral: Negative.     Objective   Vitals:   05/02/17 0924  BP: (!) 167/92  Pulse: 61  Resp: 18  Temp: 98.6 F (37 C)    Physical Exam  Constitutional: He is oriented to person, place, and time and well-developed, well-nourished, and in no distress.  HENT:  Head: Normocephalic and atraumatic.  Cardiovascular: Normal rate, regular rhythm and normal heart sounds.  Exam reveals no gallop and no friction rub.   No murmur heard. Pulmonary/Chest: Effort normal and  breath sounds normal. No respiratory distress. He has no wheezes. He has no rales.  Abdominal: Soft. Bowel sounds are normal. He exhibits no distension. There is no tenderness. There is no rebound.  Neurological: He is alert and oriented to person, place, and time.  Skin: Skin is warm and dry.  Vitals reviewed.   Assessment  Need for screening colonoscopy Plan   Patient scheduled for a screening colonoscopy on 05/07/2017. The risks and benefits of the procedure including bleeding and perforation were fully explained to the patient, who gave informed consent.

## 2017-05-02 NOTE — Progress Notes (Signed)
Michael Brady; [id number]; [birth year]   HPI Patient is a 61 year old white male who is referred to my care by Dr. Mickie Hillier for screening colonoscopy. Patient has never had a colonoscopy. He denies any family history of colon cancer. He denies any abdominal pain, weight loss, blood in stools, diarrhea, or constipation. He currently has no pain. No past medical history on file.  Past Surgical History:  Procedure Laterality Date  . SHOULDER SURGERY      No family history on file.  Current Outpatient Prescriptions on File Prior to Visit  Medication Sig Dispense Refill  . Multiple Vitamins-Minerals (MENS MULTIVITAMIN PLUS PO) Take 1 tablet by mouth daily.    Marland Kitchen triamcinolone cream (KENALOG) 0.1 % Apply 1 application topically 2 (two) times daily. (Patient not taking: Reported on 05/02/2017) 30 g 0   No current facility-administered medications on file prior to visit.     Allergies  Allergen Reactions  . Penicillins Swelling    History  Alcohol Use No    History  Smoking Status  . Current Every Day Smoker  . Types: Cigarettes  Smokeless Tobacco  . Never Used    Review of Systems  Constitutional: Negative.   HENT: Positive for sinus pain.   Eyes: Negative.   Respiratory: Positive for cough.   Cardiovascular: Negative.   Gastrointestinal: Negative.   Genitourinary: Negative.   Musculoskeletal: Negative.   Skin: Negative.   Neurological: Negative.   Endo/Heme/Allergies: Negative.   Psychiatric/Behavioral: Negative.     Objective   Vitals:   05/02/17 0924  BP: (!) 167/92  Pulse: 61  Resp: 18  Temp: 98.6 F (37 C)    Physical Exam  Constitutional: He is oriented to person, place, and time and well-developed, well-nourished, and in no distress.  HENT:  Head: Normocephalic and atraumatic.  Cardiovascular: Normal rate, regular rhythm and normal heart sounds.  Exam reveals no gallop and no friction rub.   No murmur heard. Pulmonary/Chest: Effort normal and  breath sounds normal. No respiratory distress. He has no wheezes. He has no rales.  Abdominal: Soft. Bowel sounds are normal. He exhibits no distension. There is no tenderness. There is no rebound.  Neurological: He is alert and oriented to person, place, and time.  Skin: Skin is warm and dry.  Vitals reviewed.   Assessment  Need for screening colonoscopy Plan   Patient scheduled for a screening colonoscopy on 05/07/2017. The risks and benefits of the procedure including bleeding and perforation were fully explained to the patient, who gave informed consent.

## 2017-05-02 NOTE — Patient Instructions (Signed)
Colonoscopy, Adult A colonoscopy is an exam to look at the entire large intestine. During the exam, a lubricated, bendable tube is inserted into the anus and then passed into the rectum, colon, and other parts of the large intestine. A colonoscopy is often done as a part of normal colorectal screening or in response to certain symptoms, such as anemia, persistent diarrhea, abdominal pain, and blood in the stool. The exam can help screen for and diagnose medical problems, including:  Tumors.  Polyps.  Inflammation.  Areas of bleeding.  Tell a health care provider about:  Any allergies you have.  All medicines you are taking, including vitamins, herbs, eye drops, creams, and over-the-counter medicines.  Any problems you or family members have had with anesthetic medicines.  Any blood disorders you have.  Any surgeries you have had.  Any medical conditions you have.  Any problems you have had passing stool. What are the risks? Generally, this is a safe procedure. However, problems may occur, including:  Bleeding.  A tear in the intestine.  A reaction to medicines given during the exam.  Infection (rare).  What happens before the procedure? Eating and drinking restrictions Follow instructions from your health care provider about eating and drinking, which may include:  A few days before the procedure - follow a low-fiber diet. Avoid nuts, seeds, dried fruit, raw fruits, and vegetables.  1-3 days before the procedure - follow a clear liquid diet. Drink only clear liquids, such as clear broth or bouillon, black coffee or tea, clear juice, clear soft drinks or sports drinks, gelatin dessert, and popsicles. Avoid any liquids that contain red or purple dye.  On the day of the procedure - do not eat or drink anything during the 2 hours before the procedure, or within the time period that your health care provider recommends.  Bowel prep If you were prescribed an oral bowel prep  to clean out your colon:  Take it as told by your health care provider. Starting the day before your procedure, you will need to drink a large amount of medicated liquid. The liquid will cause you to have multiple loose stools until your stool is almost clear or light green.  If your skin or anus gets irritated from diarrhea, you may use these to relieve the irritation: ? Medicated wipes, such as adult wet wipes with aloe and vitamin E. ? A skin soothing-product like petroleum jelly.  If you vomit while drinking the bowel prep, take a break for up to 60 minutes and then begin the bowel prep again. If vomiting continues and you cannot take the bowel prep without vomiting, call your health care provider.  General instructions  Ask your health care provider about changing or stopping your regular medicines. This is especially important if you are taking diabetes medicines or blood thinners.  Plan to have someone take you home from the hospital or clinic. What happens during the procedure?  An IV tube may be inserted into one of your veins.  You will be given medicine to help you relax (sedative).  To reduce your risk of infection: ? Your health care team will wash or sanitize their hands. ? Your anal area will be washed with soap.  You will be asked to lie on your side with your knees bent.  Your health care provider will lubricate a long, thin, flexible tube. The tube will have a camera and a light on the end.  The tube will be inserted into your   anus.  The tube will be gently eased through your rectum and colon.  Air will be delivered into your colon to keep it open. You may feel some pressure or cramping.  The camera will be used to take images during the procedure.  A small tissue sample may be removed from your body to be examined under a microscope (biopsy). If any potential problems are found, the tissue will be sent to a lab for testing.  If small polyps are found, your  health care provider may remove them and have them checked for cancer cells.  The tube that was inserted into your anus will be slowly removed. The procedure may vary among health care providers and hospitals. What happens after the procedure?  Your blood pressure, heart rate, breathing rate, and blood oxygen level will be monitored until the medicines you were given have worn off.  Do not drive for 24 hours after the exam.  You may have a small amount of blood in your stool.  You may pass gas and have mild abdominal cramping or bloating due to the air that was used to inflate your colon during the exam.  It is up to you to get the results of your procedure. Ask your health care provider, or the department performing the procedure, when your results will be ready. This information is not intended to replace advice given to you by your health care provider. Make sure you discuss any questions you have with your health care provider. Document Released: 06/22/2000 Document Revised: 04/25/2016 Document Reviewed: 09/06/2015 Elsevier Interactive Patient Education  2018 [employer]  

## 2017-05-07 ENCOUNTER — Ambulatory Visit (HOSPITAL_COMMUNITY)
Admission: RE | Admit: 2017-05-07 | Discharge: 2017-05-07 | Disposition: A | Discharge status: Home / Self Care | Payer: 59 | Source: Ambulatory Visit | Attending: General Surgery | Admitting: General Surgery

## 2017-05-07 ENCOUNTER — Encounter (HOSPITAL_COMMUNITY): Payer: Self-pay | Admitting: *Deleted

## 2017-05-07 ENCOUNTER — Encounter (HOSPITAL_COMMUNITY)
Admission: RE | Disposition: A | Discharge status: Home / Self Care | Payer: Self-pay | Source: Ambulatory Visit | Attending: General Surgery

## 2017-05-07 DIAGNOSIS — K5730 Diverticulosis of large intestine without perforation or abscess without bleeding: Secondary | ICD-10-CM | POA: Diagnosis not present

## 2017-05-07 DIAGNOSIS — Z1211 Encounter for screening for malignant neoplasm of colon: Secondary | ICD-10-CM | POA: Diagnosis not present

## 2017-05-07 DIAGNOSIS — Z880 Allergy status to penicillin: Secondary | ICD-10-CM | POA: Insufficient documentation

## 2017-05-07 DIAGNOSIS — F17210 Nicotine dependence, cigarettes, uncomplicated: Secondary | ICD-10-CM | POA: Insufficient documentation

## 2017-05-07 DIAGNOSIS — K644 Residual hemorrhoidal skin tags: Secondary | ICD-10-CM

## 2017-05-07 HISTORY — PX: COLONOSCOPY: SHX5424

## 2017-05-07 SURGERY — COLONOSCOPY
Anesthesia: Moderate Sedation

## 2017-05-07 MED ORDER — MEPERIDINE HCL 50 MG/ML IJ SOLN
INTRAMUSCULAR | Status: DC | PRN
  Administered 2017-05-07: 50 mg via INTRAVENOUS

## 2017-05-07 MED ORDER — STERILE WATER FOR IRRIGATION IR SOLN
Status: DC | PRN
  Administered 2017-05-07: 08:00:00

## 2017-05-07 MED ORDER — MIDAZOLAM HCL 5 MG/5ML IJ SOLN
INTRAMUSCULAR | Status: AC
  Filled 2017-05-07: qty 5

## 2017-05-07 MED ORDER — MIDAZOLAM HCL 5 MG/5ML IJ SOLN
INTRAMUSCULAR | Status: DC | PRN
  Administered 2017-05-07: 3 mg via INTRAVENOUS

## 2017-05-07 MED ORDER — SODIUM CHLORIDE 0.9 % IV SOLN
INTRAVENOUS | Status: DC
  Administered 2017-05-07: 08:00:00 via INTRAVENOUS

## 2017-05-07 MED ORDER — MEPERIDINE HCL 50 MG/ML IJ SOLN
INTRAMUSCULAR | Status: AC
  Filled 2017-05-07: qty 1

## 2017-05-07 NOTE — Discharge Instructions (Signed)
Colonoscopy, Adult, Care After This sheet gives you information about how to care for yourself after your procedure. Your health care provider may also give you more specific instructions. If you have problems or questions, contact your health care provider. What can I expect after the procedure? After the procedure, it is common to have:  A small amount of blood in your stool for 24 hours after the procedure.  Some gas.  Mild abdominal cramping or bloating.  Follow these instructions at home: General instructions   For the first 24 hours after the procedure: ? Do not drive or use machinery. ? Do not sign important documents. ? Do not drink alcohol. ? Do your regular daily activities at a slower pace than normal. ? Eat soft, easy-to-digest foods. ? Rest often.  Take over-the-counter or prescription medicines only as told by your health care provider.  It is up to you to get the results of your procedure. Ask your health care provider, or the department performing the procedure, when your results will be ready. Relieving cramping and bloating  Try walking around when you have cramps or feel bloated.  Apply heat to your abdomen as told by your health care provider. Use a heat source that your health care provider recommends, such as a moist heat pack or a heating pad. ? Place a towel between your skin and the heat source. ? Leave the heat on for 20-30 minutes. ? Remove the heat if your skin turns bright red. This is especially important if you are unable to feel pain, heat, or cold. You may have a greater risk of getting burned. Eating and drinking  Drink enough fluid to keep your urine clear or pale yellow.  Resume your normal diet as instructed by your health care provider. Avoid heavy or fried foods that are hard to digest.  Avoid drinking alcohol for as long as instructed by your health care provider. Contact a health care provider if:  You have blood in your stool 2-3  days after the procedure. Get help right away if:  You have more than a small spotting of blood in your stool.  You pass large blood clots in your stool.  Your abdomen is swollen.  You have nausea or vomiting.  You have a fever.  You have increasing abdominal pain that is not relieved with medicine. This information is not intended to replace advice given to you by your health care provider. Make sure you discuss any questions you have with your health care provider. Document Released: 02/07/2004 Document Revised: 03/19/2016 Document Reviewed: 09/06/2015 Elsevier Interactive Patient Education  2018 [employer].   Diverticulosis Diverticulosis is a condition that develops when small pouches (diverticula) form in the wall of the large intestine (colon). The colon is where water is absorbed and stool is formed. The pouches form when the inside layer of the colon pushes through weak spots in the outer layers of the colon. You may have a few pouches or many of them. What are the causes? The cause of this condition is not known. What increases the risk? The following factors may make you more likely to develop this condition:  Being older than age 94. Your risk for this condition increases with age. Diverticulosis is rare among people younger than age 82. By age 19, many people have it.  Eating a low-fiber diet.  Having frequent constipation.  Being overweight.  Not getting enough exercise.  Smoking.  Taking over-the-counter pain medicines, like aspirin and ibuprofen.  Having a family history of diverticulosis.  What are the signs or symptoms? In most people, there are no symptoms of this condition. If you do have symptoms, they may include:  Bloating.  Cramps in the abdomen.  Constipation or diarrhea.  Pain in the lower left side of the abdomen.  How is this diagnosed? This condition is most often diagnosed during an exam for other colon problems. Because  diverticulosis usually has no symptoms, it often cannot be diagnosed independently. This condition may be diagnosed by:  Using a flexible scope to examine the colon (colonoscopy).  Taking an X-ray of the colon after dye has been put into the colon (barium enema).  Doing a CT scan.  How is this treated? You may not need treatment for this condition if you have never developed an infection related to diverticulosis. If you have had an infection before, treatment may include:  Eating a high-fiber diet. This may include eating more fruits, vegetables, and grains.  Taking a fiber supplement.  Taking a live bacteria supplement (probiotic).  Taking medicine to relax your colon.  Taking antibiotic medicines.  Follow these instructions at home:  Drink 6-8 glasses of water or more each day to prevent constipation.  Try not to strain when you have a bowel movement.  If you have had an infection before: ? Eat more fiber as directed by your health care provider or your diet and nutrition specialist (dietitian). ? Take a fiber supplement or probiotic, if your health care provider approves.  Take over-the-counter and prescription medicines only as told by your health care provider.  If you were prescribed an antibiotic, take it as told by your health care provider. Do not stop taking the antibiotic even if you start to feel better.  Keep all follow-up visits as told by your health care provider. This is important. Contact a health care provider if:  You have pain in your abdomen.  You have bloating.  You have cramps.  You have not had a bowel movement in 3 days. Get help right away if:  Your pain gets worse.  Your bloating becomes very bad.  You have a fever or chills, and your symptoms suddenly get worse.  You vomit.  You have bowel movements that are bloody or black.  You have bleeding from your rectum. Summary  Diverticulosis is a condition that develops when small  pouches (diverticula) form in the wall of the large intestine (colon).  You may have a few pouches or many of them.  This condition is most often diagnosed during an exam for other colon problems.  If you have had an infection related to diverticulosis, treatment may include increasing the fiber in your diet, taking supplements, or taking medicines. This information is not intended to replace advice given to you by your health care provider. Make sure you discuss any questions you have with your health care provider. Document Released: 03/22/2004 Document Revised: 05/14/2016 Document Reviewed: 05/14/2016 Elsevier Interactive Patient Education  2017 [employer].

## 2017-05-07 NOTE — Interval H&P Note (Signed)
History and Physical Interval Note:  05/07/2017 8:26 AM  Michael Brady  has presented today for surgery, with the diagnosis of screening  The various methods of treatment have been discussed with the patient and family. After consideration of risks, benefits and other options for treatment, the patient has consented to  Procedure(s): COLONOSCOPY (N/A) as a surgical intervention .  The patient's history has been reviewed, patient examined, no change in status, stable for surgery.  I have reviewed the patient's chart and labs.  Questions were answered to the patient's satisfaction.     Aviva Signs

## 2017-05-07 NOTE — Op Note (Signed)
[facility] Patient Name: Michael Brady Procedure Date: 05/07/2017 8:04 AM MRN: [id number] Date of Birth: [birth year] Attending MD: Aviva Signs , MD CSN: [id number] Age: 61 Admit Type: Outpatient Procedure:                Colonoscopy Indications:              Screening for colorectal malignant neoplasm Providers:                Aviva Signs, MD, Otis Peak B. Gwenlyn Perking RN, RN, Aram Candela Referring MD:              Medicines:                Midazolam 3 mg IV, Meperidine 50 mg IV Complications:            No immediate complications. Estimated blood loss:                            None. Estimated Blood Loss:     Estimated blood loss: none. Procedure:                Pre-Anesthesia Assessment:                           - Prior to the procedure, a History and Physical                            was performed, and patient medications and                            allergies were reviewed. The patient is competent.                            The risks and benefits of the procedure and the                            sedation options and risks were discussed with the                            patient. All questions were answered and informed                            consent was obtained. Patient identification and                            proposed procedure were verified by the physician,                            the nurse and the technician in the procedure room.                            Mental Status Examination: normal. Airway  Examination: normal oropharyngeal airway and neck                            mobility. Respiratory Examination: clear to                            auscultation. CV Examination: RRR, no murmurs, no                            S3 or S4. Prophylactic Antibiotics: The patient                            does not require prophylactic antibiotics. Prior                            Anticoagulants: The patient has  taken no previous                            anticoagulant or antiplatelet agents. ASA Grade                            Assessment: I - A normal, healthy patient. After                            reviewing the risks and benefits, the patient was                            deemed in satisfactory condition to undergo the                            procedure. The anesthesia plan was to use moderate                            sedation / analgesia (conscious sedation).                            Immediately prior to administration of medications,                            the patient was re-assessed for adequacy to receive                            sedatives. The heart rate, respiratory rate, oxygen                            saturations, blood pressure, adequacy of pulmonary                            ventilation, and response to care were monitored                            throughout the procedure. The physical status of  the patient was re-assessed after the procedure.                           After obtaining informed consent, the colonoscope                            was passed under direct vision. Throughout the                            procedure, the patient's blood pressure, pulse, and                            oxygen saturations were monitored continuously. The                            EC-[id number] ([id number]) scope was introduced through                            the anus and advanced to the the cecum, identified                            by the appendiceal orifice, ileocecal valve and                            palpation. No anatomical landmarks were                            photographed. The colonoscopy was performed without                            difficulty. The quality of the bowel preparation                            was adequate. The patient tolerated the procedure                            well. The total duration of the procedure was 14                             minutes. Scope In: 8:29:04 AM Scope Out: 8:41:01 AM Scope Withdrawal Time: 0 hours 5 minutes 59 seconds  Total Procedure Duration: 0 hours 11 minutes 57 seconds  Findings:      The perianal exam findings include non-thrombosed external hemorrhoids.      Many large-mouthed diverticula were found in the sigmoid colon,       descending colon and transverse colon. Estimated blood loss: none.      The exam was otherwise without abnormality on direct and retroflexion       views. Impression:               - Non-thrombosed external hemorrhoids found on                            perianal exam.                           -  Diverticulosis in the sigmoid colon, in the                            descending colon and in the transverse colon.                           - The examination was otherwise normal on direct                            and retroflexion views.                           - No specimens collected. Moderate Sedation:      Moderate (conscious) sedation was administered by the endoscopy nurse       and supervised by the endoscopist. The following parameters were       monitored: oxygen saturation, heart rate, blood pressure, and response       to care. Total physician intraservice time was 14 minutes. Recommendation:           - Written discharge instructions were provided to                            the patient.                           - The signs and symptoms of potential delayed                            complications were discussed with the patient.                           - Patient has a contact number available for                            emergencies.                           - Return to normal activities tomorrow.                           - Resume previous diet.                           - Continue present medications.                           - Repeat colonoscopy in 10 years for screening                             purposes. Procedure Code(s):        --- Professional ---                           Z6109, Colorectal cancer screening; colonoscopy on                            individual not  meeting criteria for high risk                           G0500, Moderate sedation services provided by the                            same physician or other qualified health care                            professional performing a gastrointestinal                            endoscopic service that sedation supports,                            requiring the presence of an independent trained                            observer to assist in the monitoring of the                            patient's level of consciousness and physiological                            status; initial 15 minutes of intra-service time;                            patient age 61 years or older (additional time may                            be reported with [phone], as appropriate) Diagnosis Code(s):        --- Professional ---                           Z12.11, Encounter for screening for malignant                            neoplasm of colon                           K57.30, Diverticulosis of large intestine without                            perforation or abscess without bleeding                           K64.4, Residual hemorrhoidal skin tags CPT copyright 2016 American Medical Association. All rights reserved. The codes documented in this report are preliminary and upon coder review may  be revised to meet current compliance requirements. Aviva Signs, MD Aviva Signs, MD 05/07/2017 8:48:44 AM This report has been signed electronically. Number of Addenda: 0

## 2017-05-09 ENCOUNTER — Encounter (HOSPITAL_COMMUNITY): Payer: Self-pay | Admitting: General Surgery

## 2017-06-01 ENCOUNTER — Encounter (HOSPITAL_COMMUNITY): Payer: Self-pay | Admitting: Emergency Medicine

## 2017-06-01 ENCOUNTER — Other Ambulatory Visit: Payer: Self-pay

## 2017-06-01 ENCOUNTER — Emergency Department (HOSPITAL_COMMUNITY)
Admission: EM | Admit: 2017-06-01 | Discharge: 2017-06-01 | Disposition: A | Discharge status: Home / Self Care | Payer: 59 | Attending: Emergency Medicine | Admitting: Emergency Medicine

## 2017-06-01 DIAGNOSIS — Z79899 Other long term (current) drug therapy: Secondary | ICD-10-CM | POA: Diagnosis not present

## 2017-06-01 DIAGNOSIS — F17210 Nicotine dependence, cigarettes, uncomplicated: Secondary | ICD-10-CM | POA: Diagnosis not present

## 2017-06-01 DIAGNOSIS — M545 Low back pain: Secondary | ICD-10-CM | POA: Diagnosis not present

## 2017-06-01 DIAGNOSIS — R310 Gross hematuria: Secondary | ICD-10-CM

## 2017-06-01 DIAGNOSIS — R319 Hematuria, unspecified: Secondary | ICD-10-CM | POA: Diagnosis present

## 2017-06-01 LAB — URINALYSIS, ROUTINE W REFLEX MICROSCOPIC
Specific Gravity, Urine: 1.02 (ref 1.005–1.030)
pH: 7.5 (ref 5.0–8.0)

## 2017-06-01 LAB — COMPREHENSIVE METABOLIC PANEL
ALBUMIN: 4.4 g/dL (ref 3.5–5.0)
ALT: 21 U/L (ref 17–63)
ANION GAP: 7 (ref 5–15)
AST: 28 U/L (ref 15–41)
Alkaline Phosphatase: 61 U/L (ref 38–126)
BILIRUBIN TOTAL: 0.9 mg/dL (ref 0.3–1.2)
BUN: 20 mg/dL (ref 6–20)
CALCIUM: 9.7 mg/dL (ref 8.9–10.3)
CO2: 27 mmol/L (ref 22–32)
Chloride: 103 mmol/L (ref 101–111)
Creatinine, Ser: 0.83 mg/dL (ref 0.61–1.24)
GFR calc non Af Amer: >60 mL/min (ref >60)
GLUCOSE: 93 mg/dL (ref 65–99)
POTASSIUM: 4.3 mmol/L (ref 3.5–5.1)
SODIUM: 137 mmol/L (ref 135–145)
TOTAL PROTEIN: 7.3 g/dL (ref 6.5–8.1)

## 2017-06-01 LAB — CBC WITH DIFFERENTIAL/PLATELET
BASOS PCT: 0 %
Basophils Absolute: 0 10*3/uL (ref 0.0–0.1)
Eosinophils Absolute: 0.1 10*3/uL (ref 0.0–0.7)
Eosinophils Relative: 1 %
HEMATOCRIT: 44.1 % (ref 39.0–52.0)
Hemoglobin: 14.5 g/dL (ref 13.0–17.0)
LYMPHS ABS: 2.4 10*3/uL (ref 0.7–4.0)
Lymphocytes Relative: 32 %
MCH: 30.6 pg (ref 26.0–34.0)
MCHC: 32.9 g/dL (ref 30.0–36.0)
MCV: 93 fL (ref 78.0–100.0)
MONO ABS: 0.6 10*3/uL (ref 0.1–1.0)
MONOS PCT: 9 %
Neutro Abs: 4.3 10*3/uL (ref 1.7–7.7)
Neutrophils Relative %: 58 %
Platelets: 233 10*3/uL (ref 150–400)
RBC: 4.74 MIL/uL (ref 4.22–5.81)
RDW: 13.1 % (ref 11.5–15.5)
WBC: 7.4 10*3/uL (ref 4.0–10.5)

## 2017-06-01 LAB — URINALYSIS, MICROSCOPIC (REFLEX)
BACTERIA UA: NONE SEEN
SQUAMOUS EPITHELIAL / LPF: NONE SEEN

## 2017-06-01 NOTE — ED Provider Notes (Addendum)
[facility] EMERGENCY DEPARTMENT Provider Note   CSN: [id number] Arrival date & time: 06/01/17  1528     History   Chief Complaint Chief Complaint  Patient presents with  . Hematuria    HPI Michael Brady is a 61 y.o. male.  Urinated blood starting yesterday afternoon.  Also reports that he had clear urine last night but urinated blood again today.  He has no other complaint except for mild lumbar pain at midline which is worse with changing positions and improved with remaining still, typical pain he gets after lifting at work which she is done recently.  No other associated symptoms no fever no incontinence no flank pain no nausea or vomiting no abdominal pain there is no injury no treatment prior to coming here nothing makes symptoms better or worse.  No urgency or feeling of urinary retention  HPI  Past Medical History:  Diagnosis Date  . Medical history non-contributory     Patient Active Problem List   Diagnosis Date Noted  . Special screening for malignant neoplasms, colon   . Diverticulosis of large intestine without diverticulitis   . Residual hemorrhoidal skin tags   . Hyperlipidemia LDL goal <130 04/03/2017  . Anterior shoulder dislocation     Past Surgical History:  Procedure Laterality Date  . COLONOSCOPY N/A 05/07/2017   Procedure: COLONOSCOPY;  Surgeon: Aviva Signs, MD;  Location: [facility] SUITE;  Service: Gastroenterology;  Laterality: N/A;  . HAND SURGERY    . SHOULDER SURGERY         Home Medications    Prior to Admission medications   Medication Sig Start Date End Date Taking? Authorizing Provider  Multiple Vitamins-Minerals (MENS MULTIVITAMIN PLUS PO) Take 1 tablet by mouth daily.    [provider]  naproxen sodium (ANAPROX) 220 MG tablet Take 440 mg by mouth 2 (two) times daily as needed (for pain or headache).    [provider]    Family History Family History  Problem Relation Age of Onset  . Colon cancer Neg Hx      Social History Social History   Tobacco Use  . Smoking status: Current Every Day Smoker    Packs/day: 1.50    Years: 35.00    Pack years: 52.50    Types: Cigarettes  . Smokeless tobacco: Never Used  Substance Use Topics  . Alcohol use: No  . Drug use: No     Allergies   Penicillins   Review of Systems Review of Systems  Constitutional: Negative.   HENT: Negative.   Respiratory: Negative.   Cardiovascular: Negative.   Gastrointestinal: Negative.   Genitourinary: Positive for dysuria and hematuria.  Musculoskeletal: Positive for back pain.  Skin: Negative.   Neurological: Negative.   Psychiatric/Behavioral: Negative.   All other systems reviewed and are negative.    Physical Exam Updated Vital Signs BP 139/89 (BP Location: Right Arm)   Pulse 65   Temp 98.5 F (36.9 C) (Oral)   Resp 19   Ht 5\' 8"  (1.727 m)   Wt 78.9 kg (174 lb)   SpO2 97%   BMI 26.46 kg/m   Physical Exam  Constitutional: He appears well-developed and well-nourished.  HENT:  Head: Normocephalic and atraumatic.  Eyes: Conjunctivae are normal. Pupils are equal, round, and reactive to light.  Neck: Neck supple. No tracheal deviation present. No thyromegaly present.  Cardiovascular: Normal rate and regular rhythm.  No murmur heard. Pulmonary/Chest: Effort normal and breath sounds normal.  Abdominal: Soft. Bowel  sounds are normal. He exhibits no distension. There is no tenderness.  Genitourinary: Penis normal.  Musculoskeletal: Normal range of motion. He exhibits no edema or tenderness.  Neurological: He is alert. Coordination normal.  Skin: Skin is warm and dry. No rash noted.  Psychiatric: He has a normal mood and affect.  Nursing note and vitals reviewed.    ED Treatments / Results  Labs (all labs ordered are listed, but only abnormal results are displayed) Labs Reviewed - No data to display  EKG  EKG Interpretation None       Radiology No results  found.  Procedures Procedures (including critical care time)  Medications Ordered in ED Medications - No data to display Results for orders placed or performed during the hospital encounter of 06/01/17  CBC with Differential/Platelet  Result Value Ref Range   WBC 7.4 4.0 - 10.5 K/uL   RBC 4.74 4.22 - 5.81 MIL/uL   Hemoglobin 14.5 13.0 - 17.0 g/dL   HCT 44.1 39.0 - 52.0 %   MCV 93.0 78.0 - 100.0 fL   MCH 30.6 26.0 - 34.0 pg   MCHC 32.9 30.0 - 36.0 g/dL   RDW 13.1 11.5 - 15.5 %   Platelets 233 150 - 400 K/uL   Neutrophils Relative % 58 %   Neutro Abs 4.3 1.7 - 7.7 K/uL   Lymphocytes Relative 32 %   Lymphs Abs 2.4 0.7 - 4.0 K/uL   Monocytes Relative 9 %   Monocytes Absolute 0.6 0.1 - 1.0 K/uL   Eosinophils Relative 1 %   Eosinophils Absolute 0.1 0.0 - 0.7 K/uL   Basophils Relative 0 %   Basophils Absolute 0.0 0.0 - 0.1 K/uL  Comprehensive metabolic panel  Result Value Ref Range   Sodium 137 135 - 145 mmol/L   Potassium 4.3 3.5 - 5.1 mmol/L   Chloride 103 101 - 111 mmol/L   CO2 27 22 - 32 mmol/L   Glucose, Bld 93 65 - 99 mg/dL   BUN 20 6 - 20 mg/dL   Creatinine, Ser 0.83 0.61 - 1.24 mg/dL   Calcium 9.7 8.9 - 10.3 mg/dL   Total Protein 7.3 6.5 - 8.1 g/dL   Albumin 4.4 3.5 - 5.0 g/dL   AST 28 15 - 41 U/L   ALT 21 17 - 63 U/L   Alkaline Phosphatase 61 38 - 126 U/L   Total Bilirubin 0.9 0.3 - 1.2 mg/dL   GFR calc non Af Amer >60 >60 mL/min   GFR calc Af Amer >60 >60 mL/min   Anion gap 7 5 - 15  Urinalysis, Routine w reflex microscopic  Result Value Ref Range   Color, Urine RED (A) YELLOW   APPearance TURBID (A) CLEAR   Specific Gravity, Urine 1.020 1.005 - 1.030   pH 7.5 5.0 - 8.0   Glucose, UA (A) NEGATIVE mg/dL    TEST NOT REPORTED DUE TO COLOR INTERFERENCE OF URINE PIGMENT   Hgb urine dipstick (A) NEGATIVE    TEST NOT REPORTED DUE TO COLOR INTERFERENCE OF URINE PIGMENT   Bilirubin Urine (A) NEGATIVE    TEST NOT REPORTED DUE TO COLOR INTERFERENCE OF URINE PIGMENT    Ketones, ur (A) NEGATIVE mg/dL    TEST NOT REPORTED DUE TO COLOR INTERFERENCE OF URINE PIGMENT   Protein, ur (A) NEGATIVE mg/dL    TEST NOT REPORTED DUE TO COLOR INTERFERENCE OF URINE PIGMENT   Nitrite (A) NEGATIVE    TEST NOT REPORTED DUE TO COLOR INTERFERENCE OF URINE  PIGMENT   Leukocytes, UA (A) NEGATIVE    TEST NOT REPORTED DUE TO COLOR INTERFERENCE OF URINE PIGMENT  Urinalysis, Microscopic (reflex)  Result Value Ref Range   RBC / HPF TOO NUMEROUS TO COUNT 0 - 5 RBC/hpf   WBC, UA 6-30 0 - 5 WBC/hpf   Bacteria, UA NONE SEEN NONE SEEN   Squamous Epithelial / LPF NONE SEEN NONE SEEN   No results found.  Initial Impression / Assessment and Plan / ED Course  I have reviewed the triage vital signs and the nursing notes.  Pertinent labs & imaging results that were available during my care of the patient were reviewed by me and considered in my medical decision making (see chart for details).     6:20 PM patient remains asymptomatic. No signs of hemodynamic compromise.  Hemoglobin stable.  Plan referral to urology Dr.Eskridge Final Clinical Impressions(s) / ED Diagnoses  Diagnosis  Gross hematuria Final diagnoses:  None    ED Discharge Orders    None       Orlie Dakin, MD 06/01/17 [place], MD 06/01/17 Vernelle Emerald

## 2017-06-01 NOTE — Discharge Instructions (Signed)
Call Dr. Lyndal Rainbow office Monday, June 03, 2017 to schedule next available appointment.  Tell office staff that you were seen here.  Return to the emergency department if you develop lightheadedness or feel faint or are concern for any reason

## 2017-06-01 NOTE — ED Triage Notes (Signed)
Urinating blood on and off since yesterday, mild lower back pain.

## 2017-06-03 LAB — URINE CULTURE
Culture: NO GROWTH
Special Requests: NORMAL

## 2017-06-26 ENCOUNTER — Other Ambulatory Visit: Payer: Self-pay | Admitting: Urology

## 2017-07-03 ENCOUNTER — Encounter (HOSPITAL_BASED_OUTPATIENT_CLINIC_OR_DEPARTMENT_OTHER): Payer: Self-pay | Admitting: *Deleted

## 2017-07-04 ENCOUNTER — Other Ambulatory Visit: Payer: Self-pay

## 2017-07-04 ENCOUNTER — Encounter (HOSPITAL_BASED_OUTPATIENT_CLINIC_OR_DEPARTMENT_OTHER): Payer: Self-pay | Admitting: *Deleted

## 2017-07-04 NOTE — Progress Notes (Signed)
SPOKE W/ PT VIA PHONE FOR PRE-OP INTERVIEW.  NPO AFTER MN W/ EXCEPTION CLEAR LIQUIDS UNTIL 0630 (NO CREAM/ MILK PRODUCTS).  ARRIVE AT 1030.  NEEDS HG.

## 2017-07-15 ENCOUNTER — Ambulatory Visit (HOSPITAL_BASED_OUTPATIENT_CLINIC_OR_DEPARTMENT_OTHER): Payer: No Typology Code available for payment source | Admitting: Anesthesiology

## 2017-07-15 ENCOUNTER — Other Ambulatory Visit: Payer: Self-pay

## 2017-07-15 ENCOUNTER — Encounter (HOSPITAL_BASED_OUTPATIENT_CLINIC_OR_DEPARTMENT_OTHER)
Admission: RE | Disposition: A | Discharge status: Home / Self Care | Payer: Self-pay | Source: Ambulatory Visit | Attending: Urology

## 2017-07-15 ENCOUNTER — Encounter (HOSPITAL_BASED_OUTPATIENT_CLINIC_OR_DEPARTMENT_OTHER): Payer: Self-pay | Admitting: *Deleted

## 2017-07-15 ENCOUNTER — Ambulatory Visit (HOSPITAL_BASED_OUTPATIENT_CLINIC_OR_DEPARTMENT_OTHER)
Admission: RE | Admit: 2017-07-15 | Discharge: 2017-07-15 | Disposition: A | Discharge status: Home / Self Care | Payer: No Typology Code available for payment source | Source: Ambulatory Visit | Attending: Urology | Admitting: Urology

## 2017-07-15 ENCOUNTER — Other Ambulatory Visit: Payer: Self-pay | Admitting: Urology

## 2017-07-15 DIAGNOSIS — Z87891 Personal history of nicotine dependence: Secondary | ICD-10-CM | POA: Diagnosis not present

## 2017-07-15 DIAGNOSIS — Z880 Allergy status to penicillin: Secondary | ICD-10-CM | POA: Insufficient documentation

## 2017-07-15 DIAGNOSIS — N135 Crossing vessel and stricture of ureter without hydronephrosis: Secondary | ICD-10-CM | POA: Insufficient documentation

## 2017-07-15 DIAGNOSIS — R310 Gross hematuria: Secondary | ICD-10-CM | POA: Insufficient documentation

## 2017-07-15 DIAGNOSIS — N289 Disorder of kidney and ureter, unspecified: Secondary | ICD-10-CM

## 2017-07-15 HISTORY — DX: Personal history of other diseases of the musculoskeletal system and connective tissue: Z87.39

## 2017-07-15 HISTORY — DX: Simple chronic bronchitis: J41.0

## 2017-07-15 HISTORY — PX: CYSTOSCOPY/URETEROSCOPY/HOLMIUM LASER/STENT PLACEMENT: SHX6546

## 2017-07-15 HISTORY — DX: Other specified disorders of kidney and ureter: N28.89

## 2017-07-15 HISTORY — DX: Presence of spectacles and contact lenses: Z97.3

## 2017-07-15 HISTORY — DX: Diverticulosis of large intestine without perforation or abscess without bleeding: K57.30

## 2017-07-15 SURGERY — CYSTOSCOPY/URETEROSCOPY/HOLMIUM LASER/STENT PLACEMENT
Anesthesia: General | Laterality: Left

## 2017-07-15 MED ORDER — MEPERIDINE HCL 25 MG/ML IJ SOLN
6.2500 mg | INTRAMUSCULAR | Status: DC | PRN
  Filled 2017-07-15: qty 1

## 2017-07-15 MED ORDER — FENTANYL CITRATE (PF) 100 MCG/2ML IJ SOLN
25.0000 ug | INTRAMUSCULAR | Status: DC | PRN
  Filled 2017-07-15: qty 1

## 2017-07-15 MED ORDER — PROMETHAZINE HCL 25 MG/ML IJ SOLN
6.2500 mg | INTRAMUSCULAR | Status: DC | PRN
  Administered 2017-07-15: 6.25 mg via INTRAVENOUS
  Filled 2017-07-15: qty 1

## 2017-07-15 MED ORDER — SODIUM CHLORIDE 0.9 % IR SOLN
Status: DC | PRN
  Administered 2017-07-15 (×2): 3000 mL via INTRAVESICAL

## 2017-07-15 MED ORDER — PROPOFOL 10 MG/ML IV BOLUS
INTRAVENOUS | Status: DC | PRN
  Administered 2017-07-15: 150 mg via INTRAVENOUS

## 2017-07-15 MED ORDER — SUGAMMADEX SODIUM 200 MG/2ML IV SOLN
INTRAVENOUS | Status: DC | PRN
  Administered 2017-07-15: 150 mg via INTRAVENOUS

## 2017-07-15 MED ORDER — FENTANYL CITRATE (PF) 100 MCG/2ML IJ SOLN
INTRAMUSCULAR | Status: AC
  Filled 2017-07-15: qty 2

## 2017-07-15 MED ORDER — MIDAZOLAM HCL 5 MG/5ML IJ SOLN
INTRAMUSCULAR | Status: DC | PRN
  Administered 2017-07-15: 2 mg via INTRAVENOUS

## 2017-07-15 MED ORDER — SUGAMMADEX SODIUM 200 MG/2ML IV SOLN
INTRAVENOUS | Status: AC
  Filled 2017-07-15: qty 2

## 2017-07-15 MED ORDER — MIDAZOLAM HCL 2 MG/2ML IJ SOLN
0.5000 mg | Freq: Once | INTRAMUSCULAR | Status: DC | PRN
  Filled 2017-07-15: qty 2

## 2017-07-15 MED ORDER — PROPOFOL 10 MG/ML IV BOLUS
INTRAVENOUS | Status: AC
Start: 2017-07-15 — End: 2017-07-15
  Filled 2017-07-15: qty 20

## 2017-07-15 MED ORDER — LACTATED RINGERS IV SOLN
INTRAVENOUS | Status: DC
  Administered 2017-07-15 (×2): via INTRAVENOUS
  Filled 2017-07-15: qty 1000

## 2017-07-15 MED ORDER — LIDOCAINE 2% (20 MG/ML) 5 ML SYRINGE
INTRAMUSCULAR | Status: AC
  Filled 2017-07-15: qty 5

## 2017-07-15 MED ORDER — HYDROCODONE-ACETAMINOPHEN 5-325 MG PO TABS
1.0000 | ORAL_TABLET | ORAL | Status: DC | PRN
  Administered 2017-07-15: 1 via ORAL
  Filled 2017-07-15: qty 1

## 2017-07-15 MED ORDER — CIPROFLOXACIN IN D5W 400 MG/200ML IV SOLN
INTRAVENOUS | Status: AC
  Filled 2017-07-15: qty 200

## 2017-07-15 MED ORDER — CIPROFLOXACIN HCL 500 MG PO TABS: 500.0000 mg | ORAL_TABLET | Freq: Two times a day (BID) | ORAL | 0 refills | Status: AC

## 2017-07-15 MED ORDER — DEXAMETHASONE SODIUM PHOSPHATE 10 MG/ML IJ SOLN
INTRAMUSCULAR | Status: AC
  Filled 2017-07-15: qty 1

## 2017-07-15 MED ORDER — PROMETHAZINE HCL 25 MG/ML IJ SOLN
INTRAMUSCULAR | Status: AC
  Filled 2017-07-15: qty 1

## 2017-07-15 MED ORDER — DEXAMETHASONE SODIUM PHOSPHATE 4 MG/ML IJ SOLN
INTRAMUSCULAR | Status: DC | PRN
  Administered 2017-07-15: 10 mg via INTRAVENOUS

## 2017-07-15 MED ORDER — DEXAMETHASONE SODIUM PHOSPHATE 10 MG/ML IJ SOLN
INTRAMUSCULAR | Status: AC
Start: 2017-07-15 — End: 2017-07-15
  Filled 2017-07-15: qty 1

## 2017-07-15 MED ORDER — KETOROLAC TROMETHAMINE 30 MG/ML IJ SOLN
INTRAMUSCULAR | Status: AC
  Filled 2017-07-15: qty 1

## 2017-07-15 MED ORDER — ONDANSETRON HCL 4 MG/2ML IJ SOLN
INTRAMUSCULAR | Status: AC
  Filled 2017-07-15: qty 2

## 2017-07-15 MED ORDER — FENTANYL CITRATE (PF) 100 MCG/2ML IJ SOLN
INTRAMUSCULAR | Status: DC | PRN
  Administered 2017-07-15 (×2): 50 ug via INTRAVENOUS

## 2017-07-15 MED ORDER — ONDANSETRON HCL 4 MG/2ML IJ SOLN
INTRAMUSCULAR | Status: DC | PRN
  Administered 2017-07-15: 4 mg via INTRAVENOUS

## 2017-07-15 MED ORDER — CIPROFLOXACIN IN D5W 400 MG/200ML IV SOLN
400.0000 mg | INTRAVENOUS | Status: AC
  Administered 2017-07-15: 400 mg via INTRAVENOUS
  Filled 2017-07-15: qty 200

## 2017-07-15 MED ORDER — ROCURONIUM BROMIDE 50 MG/5ML IV SOSY
PREFILLED_SYRINGE | INTRAVENOUS | Status: DC | PRN
  Administered 2017-07-15: 35 mg via INTRAVENOUS

## 2017-07-15 MED ORDER — MIDAZOLAM HCL 2 MG/2ML IJ SOLN
INTRAMUSCULAR | Status: AC
  Filled 2017-07-15: qty 2

## 2017-07-15 MED ORDER — HYDROCODONE-ACETAMINOPHEN 5-325 MG PO TABS
ORAL_TABLET | ORAL | Status: AC
Start: 2017-07-15 — End: 2017-07-15
  Filled 2017-07-15: qty 1

## 2017-07-15 MED ORDER — HYDROCODONE-ACETAMINOPHEN 5-325 MG PO TABS: 1.0000 | ORAL_TABLET | ORAL | 0 refills | Status: DC | PRN

## 2017-07-15 MED ORDER — IOPAMIDOL (ISOVUE-370) INJECTION 76%
INTRAVENOUS | Status: DC | PRN
  Administered 2017-07-15: 10 mL

## 2017-07-15 MED ORDER — ARTIFICIAL TEARS OPHTHALMIC OINT
TOPICAL_OINTMENT | OPHTHALMIC | Status: AC
  Filled 2017-07-15: qty 3.5

## 2017-07-15 MED ORDER — LIDOCAINE 2% (20 MG/ML) 5 ML SYRINGE
INTRAMUSCULAR | Status: DC | PRN
  Administered 2017-07-15: 80 mg via INTRAVENOUS

## 2017-07-15 SURGICAL SUPPLY — 21 items
BAG DRAIN URO-CYSTO SKYTR STRL (DRAIN) ×3 IMPLANT
BASKET ZERO TIP NITINOL 2.4FR (BASKET) ×3 IMPLANT
CATH URET 5FR 28IN OPEN ENDED (CATHETERS) ×3 IMPLANT
CATH URET DUAL LUMEN 6-10FR 50 (CATHETERS) ×6 IMPLANT
CLOTH BEACON ORANGE TIMEOUT ST (SAFETY) ×3 IMPLANT
FIBER LASER FLEXIVA 365 (UROLOGICAL SUPPLIES) ×3 IMPLANT
FIBER LASER TRAC TIP (UROLOGICAL SUPPLIES) IMPLANT
GLOVE BIO SURGEON STRL SZ7.5 (GLOVE) ×3 IMPLANT
GOWN STRL REUS W/ TWL XL LVL3 (GOWN DISPOSABLE) ×1 IMPLANT
GOWN STRL REUS W/TWL XL LVL3 (GOWN DISPOSABLE) ×2
GUIDEWIRE STR DUAL SENSOR (WIRE) ×9 IMPLANT
INFUSOR MANOMETER BAG 3000ML (MISCELLANEOUS) ×3 IMPLANT
IV NS IRRIG 3000ML ARTHROMATIC (IV SOLUTION) ×3 IMPLANT
KIT RM TURNOVER CYSTO AR (KITS) ×3 IMPLANT
MANIFOLD NEPTUNE II (INSTRUMENTS) ×3 IMPLANT
NS IRRIG 500ML POUR BTL (IV SOLUTION) ×3 IMPLANT
PACK CYSTO (CUSTOM PROCEDURE TRAY) ×3 IMPLANT
SHEATH URETERAL 12FRX35CM (MISCELLANEOUS) ×3 IMPLANT
STENT URET 6FRX26 CONTOUR (STENTS) ×3 IMPLANT
TUBE CONNECTING 12'X1/4 (SUCTIONS) ×1
TUBE CONNECTING 12X1/4 (SUCTIONS) ×2 IMPLANT

## 2017-07-15 NOTE — H&P (View-Only) (Signed)
CC: I have blood in my urine.  HPI: Michael Brady is a 62 year-old male established patient who is here for blood in the urine.  He did see the blood in his urine. He has not seen blood clots.   He does not have a burning sensation when he urinates. He is not currently having trouble urinating.   He has not had kidney stones. He is not having pain. He has not recently had unwanted weight loss.   Patient presents today for evaluation of gross hematuria. He was seen in the emergency department recently for this. No imaging was performed. This is the first time he has ever noticed blood in his urine. It has been going on approximately 2-3 days. No clots. He denies the feeling of incomplete bladder emptying. No previous history of kidney stones. Patient is a smoker.   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     ALLERGIES: Penicillin    MEDICATIONS: Aleve  Multivitamin     GU PSH: Locm 300-399Mg /Ml Iodine,1Ml - 06/14/2017    NON-GU PSH: Dental Surgery Procedure Hand/finger Surgery, Left Shoulder Surgery (Unspecified)    GU PMH: Gross hematuria - 06/03/2017    NON-GU PMH: None   FAMILY HISTORY: None   SOCIAL HISTORY: Marital Status: Married Preferred Language: English Current Smoking Status: Patient does not smoke anymore.   Tobacco Use Assessment Completed: Used Tobacco in last 30 days? Has never drank.  Drinks 1 caffeinated drink per day. Patient's occupation Training and development officer.    REVIEW OF SYSTEMS:    GU Review Male:   Patient denies frequent urination, hard to postpone urination, burning/ pain with urination, get up at night to urinate, leakage of urine, stream starts and stops, trouble starting your stream, have to strain to urinate , erection problems, and penile pain.  Gastrointestinal (Upper):   Patient reports indigestion/ heartburn. Patient denies nausea and vomiting.  Gastrointestinal (Lower):   Patient  denies diarrhea and constipation.  Constitutional:   Patient denies fever, night sweats, weight loss, and fatigue.  Skin:   Patient denies skin rash/ lesion and itching.  Eyes:   Patient denies blurred vision and double vision.  Ears/ Nose/ Throat:   Patient reports sinus problems. Patient denies sore throat.  Hematologic/Lymphatic:   Patient denies swollen glands and easy bruising.  Cardiovascular:   Patient denies leg swelling and chest pains.  Respiratory:   Patient denies cough and shortness of breath.  Endocrine:   Patient denies excessive thirst.  Musculoskeletal:   Patient reports joint pain. Patient denies back pain.  Neurological:   Patient denies headaches and dizziness.  Psychologic:   Patient denies depression and anxiety.   VITAL SIGNS:      06/24/2017 02:57 PM  Weight 174 lb / 78.93 kg  Height 69 in / 175.26 cm  BP 128/80 mmHg  Pulse 59 /min  Temperature 98.3 F / 36.8 C  BMI 25.7 kg/m   MULTI-SYSTEM PHYSICAL EXAMINATION:    Constitutional: Well-nourished. No physical deformities. Normally developed. Good grooming.  Neck: Neck symmetrical, not swollen. Normal tracheal position.  Respiratory: No labored breathing, no use of accessory muscles.   Cardiovascular: Normal temperature, normal extremity pulses, no swelling, no varicosities.  Skin: No paleness, no jaundice, no cyanosis. No lesion, no ulcer, no rash.  Gastrointestinal: No mass, no tenderness, no rigidity, non obese abdomen.  Eyes: Normal conjunctivae. Normal eyelids.  Ears, Nose, Mouth, and Throat:  Left ear no scars, no lesions, no masses. Right ear no scars, no lesions, no masses. Nose no scars, no lesions, no masses. Normal hearing. Normal lips.  Musculoskeletal: Normal gait and station of head and neck.     PAST DATA REVIEWED:  Source Of History:  Patient  X-Ray Review: C.T. Hematuria: Reviewed Films. Reviewed Report. Discussed With Patient.     PROCEDURES:         Flexible Cystoscopy - 52000  Risks,  benefits, and some of the potential complications of the procedure were discussed at length with the patient including infection, bleeding, voiding discomfort, urinary retention, fever, chills, sepsis, and others. All questions were answered. Informed consent was obtained. Antibiotic prophylaxis was given. Sterile technique and intraurethral analgesia were used.  Meatus:  Normal size. Normal location. Normal condition.  Urethra:  No strictures.  External Sphincter:  Normal.  Verumontanum:  Normal.  Prostate:  Obstructing. No hyperplasia.  Bladder Neck:  Non-obstructing.  Ureteral Orifices:  Normal location. Normal size. Normal shape. Effluxed clear urine.  Bladder:  No trabeculation. No tumors. Normal mucosa. No stones.      The lower urinary tract was carefully examined. The procedure was well-tolerated and without complications. Antibiotic instructions were given. Instructions were given to call the office immediately for bloody urine, difficulty urinating, urinary retention, painful or frequent urination, fever, chills, nausea, vomiting or other illness. The patient stated that he understood these instructions and would comply with them.         Urinalysis w/Scope Dipstick Dipstick Cont'd Micro  Color: Yellow Bilirubin: Neg WBC/hpf: 0 - 5/hpf  Appearance: Clear Ketones: Neg RBC/hpf: 3 - 10/hpf  Specific Gravity: 1.025 Blood: 1+ Bacteria: NS (Not Seen)  pH: 5.5 Protein: Trace Cystals: NS (Not Seen)  Glucose: Neg Urobilinogen: 0.2 Casts: NS (Not Seen)    Nitrites: Neg Trichomonas: Not Present    Leukocyte Esterase: Neg Mucous: Not Present      Epithelial Cells: 0 - 5/hpf      Yeast: NS (Not Seen)      Sperm: Not Present    ASSESSMENT:      ICD-10 Details  1 GU:   Gross hematuria - R31.0    PLAN:           Orders Labs Urine Culture          Document Letter(s):  Created for W [name], MD   Created for Patient: Clinical Summary         Notes:   I discussed with the  patient and his wife his lesion in the left renal collecting system. We did discuss that this is concerning for malignancy. We discussed the next that would be cystoscopy, left ureteroscopy, tumor biopsy, laser ablation of tumor, left ureteral stent placement. We discussed the risks, benefits, indications and procedure. He understands the risks include but are not limited to bleeding, infection, iatrogenic, need for procedures, need for ureteral stent. All questions were answered. The patient has elected to proceed.  CC: I have blood in my urine.  HPI: Michael Brady is a 62 year-old male established patient who is here for blood in the urine.  He did see the blood in his urine. He has not seen blood clots.   He does not have a burning sensation when he urinates. He is not currently having trouble urinating.   He has not had kidney stones. He is not having pain. He has not recently had unwanted weight loss.   Patient presents today for  evaluation of gross hematuria. He was seen in the emergency department recently for this. No imaging was performed. This is the first time he has ever noticed blood in his urine. It has been going on approximately 2-3 days. No clots. He denies the feeling of incomplete bladder emptying. No previous history of kidney stones. Patient is a smoker.   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     ALLERGIES: Penicillin    MEDICATIONS: Aleve  Multivitamin     GU PSH: Locm 300-399Mg /Ml Iodine,1Ml - 06/14/2017    NON-GU PSH: Dental Surgery Procedure Hand/finger Surgery, Left Shoulder Surgery (Unspecified)    GU PMH: Gross hematuria - 06/03/2017    NON-GU PMH: None   FAMILY HISTORY: None   SOCIAL HISTORY: Marital Status: Married Preferred Language: English Current Smoking Status: Patient does not smoke anymore.   Tobacco Use Assessment Completed: Used Tobacco in last 30 days? Has never drank.   Drinks 1 caffeinated drink per day. Patient's occupation Training and development officer.    REVIEW OF SYSTEMS:    GU Review Male:   Patient denies frequent urination, hard to postpone urination, burning/ pain with urination, get up at night to urinate, leakage of urine, stream starts and stops, trouble starting your stream, have to strain to urinate , erection problems, and penile pain.  Gastrointestinal (Upper):   Patient reports indigestion/ heartburn. Patient denies nausea and vomiting.  Gastrointestinal (Lower):   Patient denies diarrhea and constipation.  Constitutional:   Patient denies fever, night sweats, weight loss, and fatigue.  Skin:   Patient denies skin rash/ lesion and itching.  Eyes:   Patient denies blurred vision and double vision.  Ears/ Nose/ Throat:   Patient reports sinus problems. Patient denies sore throat.  Hematologic/Lymphatic:   Patient denies swollen glands and easy bruising.  Cardiovascular:   Patient denies leg swelling and chest pains.  Respiratory:   Patient denies cough and shortness of breath.  Endocrine:   Patient denies excessive thirst.  Musculoskeletal:   Patient reports joint pain. Patient denies back pain.  Neurological:   Patient denies headaches and dizziness.  Psychologic:   Patient denies depression and anxiety.   VITAL SIGNS:      06/24/2017 02:57 PM  Weight 174 lb / 78.93 kg  Height 69 in / 175.26 cm  BP 128/80 mmHg  Pulse 59 /min  Temperature 98.3 F / 36.8 C  BMI 25.7 kg/m   MULTI-SYSTEM PHYSICAL EXAMINATION:    Constitutional: Well-nourished. No physical deformities. Normally developed. Good grooming.  Neck: Neck symmetrical, not swollen. Normal tracheal position.  Respiratory: No labored breathing, no use of accessory muscles.   Cardiovascular: Normal temperature, normal extremity pulses, no swelling, no varicosities.  Skin: No paleness, no jaundice, no cyanosis. No lesion, no ulcer, no rash.  Gastrointestinal: No mass, no tenderness, no  rigidity, non obese abdomen.  Eyes: Normal conjunctivae. Normal eyelids.  Ears, Nose, Mouth, and Throat: Left ear no scars, no lesions, no masses. Right ear no scars, no lesions, no masses. Nose no scars, no lesions, no masses. Normal hearing. Normal lips.  Musculoskeletal: Normal gait and station of head and neck.     PAST DATA REVIEWED:  Source Of History:  Patient  X-Ray Review: C.T. Hematuria: Reviewed Films. Reviewed Report. Discussed With Patient.     PROCEDURES:         Flexible Cystoscopy - 52000  Risks, benefits, and some of the  potential complications of the procedure were discussed at length with the patient including infection, bleeding, voiding discomfort, urinary retention, fever, chills, sepsis, and others. All questions were answered. Informed consent was obtained. Antibiotic prophylaxis was given. Sterile technique and intraurethral analgesia were used.  Meatus:  Normal size. Normal location. Normal condition.  Urethra:  No strictures.  External Sphincter:  Normal.  Verumontanum:  Normal.  Prostate:  Obstructing. No hyperplasia.  Bladder Neck:  Non-obstructing.  Ureteral Orifices:  Normal location. Normal size. Normal shape. Effluxed clear urine.  Bladder:  No trabeculation. No tumors. Normal mucosa. No stones.      The lower urinary tract was carefully examined. The procedure was well-tolerated and without complications. Antibiotic instructions were given. Instructions were given to call the office immediately for bloody urine, difficulty urinating, urinary retention, painful or frequent urination, fever, chills, nausea, vomiting or other illness. The patient stated that he understood these instructions and would comply with them.         Urinalysis w/Scope Dipstick Dipstick Cont'd Micro  Color: Yellow Bilirubin: Neg WBC/hpf: 0 - 5/hpf  Appearance: Clear Ketones: Neg RBC/hpf: 3 - 10/hpf  Specific Gravity: 1.025 Blood: 1+ Bacteria: NS (Not Seen)  pH: 5.5 Protein:  Trace Cystals: NS (Not Seen)  Glucose: Neg Urobilinogen: 0.2 Casts: NS (Not Seen)    Nitrites: Neg Trichomonas: Not Present    Leukocyte Esterase: Neg Mucous: Not Present      Epithelial Cells: 0 - 5/hpf      Yeast: NS (Not Seen)      Sperm: Not Present    ASSESSMENT:      ICD-10 Details  1 GU:   Gross hematuria - R31.0    PLAN:           Orders Labs Urine Culture          Document Letter(s):  Created for W [name], MD   Created for Patient: Clinical Summary         Notes:   I discussed with the patient and his wife his lesion in the left renal collecting system. We did discuss that this is concerning for malignancy. We discussed the next that would be cystoscopy, left ureteroscopy, tumor biopsy, laser ablation of tumor, left ureteral stent placement. We discussed the risks, benefits, indications and procedure. He understands the risks include but are not limited to bleeding, infection, iatrogenic, need for procedures, need for ureteral stent. All questions were answered. The patient has elected to proceed.

## 2017-07-15 NOTE — H&P (Signed)
CC: I have blood in my urine.  HPI: Michael Brady is a 62 year-old male established patient who is here for blood in the urine.  He did see the blood in his urine. He has not seen blood clots.   He does not have a burning sensation when he urinates. He is not currently having trouble urinating.   He has not had kidney stones. He is not having pain. He has not recently had unwanted weight loss.   Patient presents today for evaluation of gross hematuria. He was seen in the emergency department recently for this. No imaging was performed. This is the first time he has ever noticed blood in his urine. It has been going on approximately 2-3 days. No clots. He denies the feeling of incomplete bladder emptying. No previous history of kidney stones. Patient is a smoker.   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     ALLERGIES: Penicillin    MEDICATIONS: Aleve  Multivitamin     GU PSH: Locm 300-399Mg /Ml Iodine,1Ml - 06/14/2017    NON-GU PSH: Dental Surgery Procedure Hand/finger Surgery, Left Shoulder Surgery (Unspecified)    GU PMH: Gross hematuria - 06/03/2017    NON-GU PMH: None   FAMILY HISTORY: None   SOCIAL HISTORY: Marital Status: Married Preferred Language: English Current Smoking Status: Patient does not smoke anymore.   Tobacco Use Assessment Completed: Used Tobacco in last 30 days? Has never drank.  Drinks 1 caffeinated drink per day. Patient's occupation Training and development officer.    REVIEW OF SYSTEMS:    GU Review Male:   Patient denies frequent urination, hard to postpone urination, burning/ pain with urination, get up at night to urinate, leakage of urine, stream starts and stops, trouble starting your stream, have to strain to urinate , erection problems, and penile pain.  Gastrointestinal (Upper):   Patient reports indigestion/ heartburn. Patient denies nausea and vomiting.  Gastrointestinal (Lower):   Patient  denies diarrhea and constipation.  Constitutional:   Patient denies fever, night sweats, weight loss, and fatigue.  Skin:   Patient denies skin rash/ lesion and itching.  Eyes:   Patient denies blurred vision and double vision.  Ears/ Nose/ Throat:   Patient reports sinus problems. Patient denies sore throat.  Hematologic/Lymphatic:   Patient denies swollen glands and easy bruising.  Cardiovascular:   Patient denies leg swelling and chest pains.  Respiratory:   Patient denies cough and shortness of breath.  Endocrine:   Patient denies excessive thirst.  Musculoskeletal:   Patient reports joint pain. Patient denies back pain.  Neurological:   Patient denies headaches and dizziness.  Psychologic:   Patient denies depression and anxiety.   VITAL SIGNS:      06/24/2017 02:57 PM  Weight 174 lb / 78.93 kg  Height 69 in / 175.26 cm  BP 128/80 mmHg  Pulse 59 /min  Temperature 98.3 F / 36.8 C  BMI 25.7 kg/m   MULTI-SYSTEM PHYSICAL EXAMINATION:    Constitutional: Well-nourished. No physical deformities. Normally developed. Good grooming.  Neck: Neck symmetrical, not swollen. Normal tracheal position.  Respiratory: No labored breathing, no use of accessory muscles.   Cardiovascular: Normal temperature, normal extremity pulses, no swelling, no varicosities.  Skin: No paleness, no jaundice, no cyanosis. No lesion, no ulcer, no rash.  Gastrointestinal: No mass, no tenderness, no rigidity, non obese abdomen.  Eyes: Normal conjunctivae. Normal eyelids.  Ears, Nose, Mouth, and Throat:  Left ear no scars, no lesions, no masses. Right ear no scars, no lesions, no masses. Nose no scars, no lesions, no masses. Normal hearing. Normal lips.  Musculoskeletal: Normal gait and station of head and neck.     PAST DATA REVIEWED:  Source Of History:  Patient  X-Ray Review: C.T. Hematuria: Reviewed Films. Reviewed Report. Discussed With Patient.     PROCEDURES:         Flexible Cystoscopy - 52000  Risks,  benefits, and some of the potential complications of the procedure were discussed at length with the patient including infection, bleeding, voiding discomfort, urinary retention, fever, chills, sepsis, and others. All questions were answered. Informed consent was obtained. Antibiotic prophylaxis was given. Sterile technique and intraurethral analgesia were used.  Meatus:  Normal size. Normal location. Normal condition.  Urethra:  No strictures.  External Sphincter:  Normal.  Verumontanum:  Normal.  Prostate:  Obstructing. No hyperplasia.  Bladder Neck:  Non-obstructing.  Ureteral Orifices:  Normal location. Normal size. Normal shape. Effluxed clear urine.  Bladder:  No trabeculation. No tumors. Normal mucosa. No stones.      The lower urinary tract was carefully examined. The procedure was well-tolerated and without complications. Antibiotic instructions were given. Instructions were given to call the office immediately for bloody urine, difficulty urinating, urinary retention, painful or frequent urination, fever, chills, nausea, vomiting or other illness. The patient stated that he understood these instructions and would comply with them.         Urinalysis w/Scope Dipstick Dipstick Cont'd Micro  Color: Yellow Bilirubin: Neg WBC/hpf: 0 - 5/hpf  Appearance: Clear Ketones: Neg RBC/hpf: 3 - 10/hpf  Specific Gravity: 1.025 Blood: 1+ Bacteria: NS (Not Seen)  pH: 5.5 Protein: Trace Cystals: NS (Not Seen)  Glucose: Neg Urobilinogen: 0.2 Casts: NS (Not Seen)    Nitrites: Neg Trichomonas: Not Present    Leukocyte Esterase: Neg Mucous: Not Present      Epithelial Cells: 0 - 5/hpf      Yeast: NS (Not Seen)      Sperm: Not Present    ASSESSMENT:      ICD-10 Details  1 GU:   Gross hematuria - R31.0    PLAN:           Orders Labs Urine Culture          Document Letter(s):  Created for W [name], MD   Created for Patient: Clinical Summary         Notes:   I discussed with the  patient and his wife his lesion in the left renal collecting system. We did discuss that this is concerning for malignancy. We discussed the next that would be cystoscopy, left ureteroscopy, tumor biopsy, laser ablation of tumor, left ureteral stent placement. We discussed the risks, benefits, indications and procedure. He understands the risks include but are not limited to bleeding, infection, iatrogenic, need for procedures, need for ureteral stent. All questions were answered. The patient has elected to proceed.  CC: I have blood in my urine.  HPI: Michael Brady is a 62 year-old male established patient who is here for blood in the urine.  He did see the blood in his urine. He has not seen blood clots.   He does not have a burning sensation when he urinates. He is not currently having trouble urinating.   He has not had kidney stones. He is not having pain. He has not recently had unwanted weight loss.   Patient presents today for  evaluation of gross hematuria. He was seen in the emergency department recently for this. No imaging was performed. This is the first time he has ever noticed blood in his urine. It has been going on approximately 2-3 days. No clots. He denies the feeling of incomplete bladder emptying. No previous history of kidney stones. Patient is a smoker.   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     ALLERGIES: Penicillin    MEDICATIONS: Aleve  Multivitamin     GU PSH: Locm 300-399Mg /Ml Iodine,1Ml - 06/14/2017    NON-GU PSH: Dental Surgery Procedure Hand/finger Surgery, Left Shoulder Surgery (Unspecified)    GU PMH: Gross hematuria - 06/03/2017    NON-GU PMH: None   FAMILY HISTORY: None   SOCIAL HISTORY: Marital Status: Married Preferred Language: English Current Smoking Status: Patient does not smoke anymore.   Tobacco Use Assessment Completed: Used Tobacco in last 30 days? Has never drank.   Drinks 1 caffeinated drink per day. Patient's occupation Training and development officer.    REVIEW OF SYSTEMS:    GU Review Male:   Patient denies frequent urination, hard to postpone urination, burning/ pain with urination, get up at night to urinate, leakage of urine, stream starts and stops, trouble starting your stream, have to strain to urinate , erection problems, and penile pain.  Gastrointestinal (Upper):   Patient reports indigestion/ heartburn. Patient denies nausea and vomiting.  Gastrointestinal (Lower):   Patient denies diarrhea and constipation.  Constitutional:   Patient denies fever, night sweats, weight loss, and fatigue.  Skin:   Patient denies skin rash/ lesion and itching.  Eyes:   Patient denies blurred vision and double vision.  Ears/ Nose/ Throat:   Patient reports sinus problems. Patient denies sore throat.  Hematologic/Lymphatic:   Patient denies swollen glands and easy bruising.  Cardiovascular:   Patient denies leg swelling and chest pains.  Respiratory:   Patient denies cough and shortness of breath.  Endocrine:   Patient denies excessive thirst.  Musculoskeletal:   Patient reports joint pain. Patient denies back pain.  Neurological:   Patient denies headaches and dizziness.  Psychologic:   Patient denies depression and anxiety.   VITAL SIGNS:      06/24/2017 02:57 PM  Weight 174 lb / 78.93 kg  Height 69 in / 175.26 cm  BP 128/80 mmHg  Pulse 59 /min  Temperature 98.3 F / 36.8 C  BMI 25.7 kg/m   MULTI-SYSTEM PHYSICAL EXAMINATION:    Constitutional: Well-nourished. No physical deformities. Normally developed. Good grooming.  Neck: Neck symmetrical, not swollen. Normal tracheal position.  Respiratory: No labored breathing, no use of accessory muscles.   Cardiovascular: Normal temperature, normal extremity pulses, no swelling, no varicosities.  Skin: No paleness, no jaundice, no cyanosis. No lesion, no ulcer, no rash.  Gastrointestinal: No mass, no tenderness, no  rigidity, non obese abdomen.  Eyes: Normal conjunctivae. Normal eyelids.  Ears, Nose, Mouth, and Throat: Left ear no scars, no lesions, no masses. Right ear no scars, no lesions, no masses. Nose no scars, no lesions, no masses. Normal hearing. Normal lips.  Musculoskeletal: Normal gait and station of head and neck.     PAST DATA REVIEWED:  Source Of History:  Patient  X-Ray Review: C.T. Hematuria: Reviewed Films. Reviewed Report. Discussed With Patient.     PROCEDURES:         Flexible Cystoscopy - 52000  Risks, benefits, and some of the  potential complications of the procedure were discussed at length with the patient including infection, bleeding, voiding discomfort, urinary retention, fever, chills, sepsis, and others. All questions were answered. Informed consent was obtained. Antibiotic prophylaxis was given. Sterile technique and intraurethral analgesia were used.  Meatus:  Normal size. Normal location. Normal condition.  Urethra:  No strictures.  External Sphincter:  Normal.  Verumontanum:  Normal.  Prostate:  Obstructing. No hyperplasia.  Bladder Neck:  Non-obstructing.  Ureteral Orifices:  Normal location. Normal size. Normal shape. Effluxed clear urine.  Bladder:  No trabeculation. No tumors. Normal mucosa. No stones.      The lower urinary tract was carefully examined. The procedure was well-tolerated and without complications. Antibiotic instructions were given. Instructions were given to call the office immediately for bloody urine, difficulty urinating, urinary retention, painful or frequent urination, fever, chills, nausea, vomiting or other illness. The patient stated that he understood these instructions and would comply with them.         Urinalysis w/Scope Dipstick Dipstick Cont'd Micro  Color: Yellow Bilirubin: Neg WBC/hpf: 0 - 5/hpf  Appearance: Clear Ketones: Neg RBC/hpf: 3 - 10/hpf  Specific Gravity: 1.025 Blood: 1+ Bacteria: NS (Not Seen)  pH: 5.5 Protein:  Trace Cystals: NS (Not Seen)  Glucose: Neg Urobilinogen: 0.2 Casts: NS (Not Seen)    Nitrites: Neg Trichomonas: Not Present    Leukocyte Esterase: Neg Mucous: Not Present      Epithelial Cells: 0 - 5/hpf      Yeast: NS (Not Seen)      Sperm: Not Present    ASSESSMENT:      ICD-10 Details  1 GU:   Gross hematuria - R31.0    PLAN:           Orders Labs Urine Culture          Document Letter(s):  Created for W [name], MD   Created for Patient: Clinical Summary         Notes:   I discussed with the patient and his wife his lesion in the left renal collecting system. We did discuss that this is concerning for malignancy. We discussed the next that would be cystoscopy, left ureteroscopy, tumor biopsy, laser ablation of tumor, left ureteral stent placement. We discussed the risks, benefits, indications and procedure. He understands the risks include but are not limited to bleeding, infection, iatrogenic, need for procedures, need for ureteral stent. All questions were answered. The patient has elected to proceed.

## 2017-07-15 NOTE — Transfer of Care (Signed)
  Last Vitals:  Vitals:   07/15/17 1035  BP: (!) 146/82  Pulse: 73  Resp: 16  Temp: 36.6 C  SpO2: 99%    Last Pain:  Vitals:   07/15/17 1035  TempSrc: Oral      Patients Stated Pain Goal: 6 (07/15/17 1051)  Immediate Anesthesia Transfer of Care Note  Patient: Michael Brady  Procedure(s) Performed: Procedure(s) (LRB): CYSTOSCOPY, ATTEMTED URETEROSCOPY/,STENT PLACEMENT (Left)  Patient Location: PACU  Anesthesia Type: General  Level of Consciousness: awake, alert  and oriented  Airway & Oxygen Therapy: Patient Spontanous Breathing and Patient connected to nasal cannula oxygen  Post-op Assessment: Report given to PACU RN and Post -op Vital signs reviewed and stable  Post vital signs: Reviewed and stable  Complications: No apparent anesthesia complications

## 2017-07-15 NOTE — Discharge Instructions (Signed)
Post Anesthesia Home Care Instructions  Activity: Get plenty of rest for the remainder of the day. A responsible individual must stay with you for 24 hours following the procedure.  For the next 24 hours, DO NOT: -Drive a car -Paediatric nurse -Drink alcoholic beverages -Take any medication unless instructed by your physician -Make any legal decisions or sign important papers.  Meals: Start with liquid foods such as gelatin or soup. Progress to regular foods as tolerated. Avoid greasy, spicy, heavy foods. If nausea and/or vomiting occur, drink only clear liquids until the nausea and/or vomiting subsides. Call your physician if vomiting continues.  Special Instructions/Symptoms: Your throat may feel dry or sore from the anesthesia or the breathing tube placed in your throat during surgery. If this causes discomfort, gargle with warm salt water. The discomfort should disappear within 24 hours.  If you had a scopolamine patch placed behind your ear for the management of post- operative nausea and/or vomiting:  1. The medication in the patch is effective for 72 hours, after which it should be removed.  Wrap patch in a tissue and discard in the trash. Wash hands thoroughly with soap and water. 2. You may remove the patch earlier than 72 hours if you experience unpleasant side effects which may include dry mouth, dizziness or visual disturbances. 3. Avoid touching the patch. Wash your hands with soap and water after contact with the patch.        r:        [facility] [phone] Post Ureteroscopy With or Without Stent Instructions  Definitions:  Ureter: The duct that transports urine from the kidney to the bladder. Stent:   A plastic hollow tube that is placed into the ureter, from the kidney to the                 bladder to prevent the ureter from swelling shut.  GENERAL INSTRUCTIONS:  Despite the fact that no skin incisions were used, the area around  the ureter and bladder is raw and irritated. The stent is a foreign body which will further irritate the bladder wall. This irritation is manifested by increased frequency of urination, both day and night, and by an increase in the urge to urinate. In some, the urge to urinate is present almost always. Sometimes the urge is strong enough that you may not be able to stop yourself from urinating. The only real cure is to remove the stent and then give time for the bladder wall to heal which can't be done until the danger of the ureter swelling shut has passed, which varies.  You may see some blood in your urine while the stent is in place and a few days afterwards. Do not be alarmed, even if the urine was clear for a while. Get off your feet and drink lots of fluids until clearing occurs. If you start to pass clots or don't improve, call us.  DIET: You may return to your normal diet immediately. Because of the raw surface of your bladder, alcohol, spicy foods, acid type foods and drinks with caffeine may cause irritation or frequency and should be used in moderation. To keep your urine flowing freely and to avoid constipation, drink plenty of fluids during the day ( 8-10 glasses ). Tip: Avoid cranberry juice because it is very acidic.  ACTIVITY: Your physical activity doesn't need to be restricted. However, if you are very active, you may see some blood in your urine. We suggest that you  reduce your activity under these circumstances until the bleeding has stopped.  BOWELS: It is important to keep your bowels regular during the postoperative period. Straining with bowel movements can cause bleeding. A bowel movement every other day is reasonable. Use a mild laxative if needed, such as Milk of Magnesia 2-3 tablespoons, or 2 Dulcolax tablets. Call if you continue to have problems. If you have been taking narcotics for pain, before, during or after your surgery, you may be constipated. Take a laxative if  necessary.   MEDICATION: You should resume your pre-surgery medications unless told not to. In addition you will often be given an antibiotic to prevent infection. These should be taken as prescribed until the bottles are finished unless you are having an unusual reaction to one of the drugs.  PROBLEMS YOU SHOULD REPORT TO [place]:  Fevers over 100.5 Fahrenheit.  Heavy bleeding, or clots ( See above notes about blood in urine ).  Inability to urinate.  Drug reactions ( hives, rash, nausea, vomiting, diarrhea ).  Severe burning or pain with urination that is not improving.  FOLLOW-UP: You will need a follow-up appointment to monitor your progress. Call for this appointment at the number listed above. Usually the first appointment will be about three to fourteen days after your surgery.

## 2017-07-15 NOTE — Op Note (Signed)
Date of procedure: 07/15/17  Preoperative diagnosis:  1. Left upper urinary tract lesion  Postoperative diagnosis:  1. Left upper urinary tract lesion 2. Left proximal ureteral stricture  Procedure: 1. Cystoscopy 2. Attempted left ureteroscopy 3. Left retrograde pyelogram with interpretation 4. Left ureteral stent placement 6 French by 26 cm  Surgeon: Baruch Gouty, MD  Anesthesia: General  Complications: None  Intraoperative findings: The patient had a stricture of the left proximal ureter the prevented ureteroscopic evaluation of his known left lower pole lesion.  Left retrograde pyelogram did confirm this filling defect is previously seen on CT.  EBL: None  Specimens: None  Drains: 6 French by 26 cm left double-J ureteral stent  Disposition: Stable to the postanesthesia care unit  Indication for procedure: The patient is a 62 y.o. male with history of gross hematuria whose workup revealed a 4 cm left lower pole lesion concerning for TCC presents today for ureteroscopic evaluation and biopsy.  After reviewing the management options for treatment, the patient elected to proceed with the above surgical procedure(s). We have discussed the potential benefits and risks of the procedure, side effects of the proposed treatment, the likelihood of the patient achieving the goals of the procedure, and any potential problems that might occur during the procedure or recuperation. Informed consent has been obtained.  Description of procedure: The patient was met in the preoperative area. All risks, benefits, and indications of the procedure were described in great detail. The patient consented to the procedure. Preoperative antibiotics were given. The patient was taken to the operative theater. General anesthesia was induced per the anesthesia service. The patient was then placed in the dorsal lithotomy position and prepped and draped in the usual sterile fashion. A preoperative timeout was  called.   A 21 French 30 degree cystoscope was inserted into the patient's bladder per urethra atraumatically.  Pan cystoscopy was unremarkable.  Left ureteral orifice was identified with the aid of a dual lumen catheter to sensor wire was advanced to the level of the left renal pelvis under fluoroscopy.  The cystoscope was withdrawn.  Over 1 of the sensor wire is a dual lumen catheter was placed into the mid to proximal ureter atraumatically.  This  flexible ureteroscope was then advanced into the left ureter.  There was a  narrowing of the proximal ureter below the UPJ.  The ureteroscope was  not able to be advanced past this.  A sensor wire was placed through the ureteroscope and the level of the left renal pelvis on fluoroscopy.  Attempt was made to advance the ureteroscope over this sensor wire.  On fluoroscopy and visually this ureteroscope continue to buckle at the stricture.  A small amount of periureteral fat was seen.  This point the decision was made to temporize the patient with a left ureteral stent to allow passive dilation of the stricture in order to not cause iatrogenic injury or risk spreading tumor cells through the ureteral perforation.  Retrograde pyelogram was obtained to identify the collecting system.  The filling defect consistent with the known left urinary upper tract lesion was seen.  The access sheath was removed under direct visualization showing no trauma.  The cystoscope was then reassembled around the remaining sensor wire.  A 6 French by 26 cm double-J ureteral stent was then placed in the sensor wire removed.  A curl was seen in the left renal pelvis under fluoroscopy and in the urinary bladder under direct fixation.  Bladder was drained.  There  is minor oozing from the 6 o'clock position of the prostate which was fulgurated with the Bugbee to prevent clot formation.  The patient was then awoke from anesthesia and transferred in stable condition to the postanesthesia care  unit.  Plan: The patient will follow-up in 2 weeks for repeat attempt at ureteroscopy after passive dilation of the left ureter.  Baruch Gouty, M.D.

## 2017-07-15 NOTE — Anesthesia Preprocedure Evaluation (Signed)
Anesthesia Evaluation  Patient identified by MRN, date of birth, ID band Patient awake    Reviewed: Allergy & Precautions, NPO status , Patient's Chart, lab work & pertinent test results  History of Anesthesia Complications Negative for: history of anesthetic complications  Airway Mallampati: II  TM Distance: >3 FB Neck ROM: Full    Dental  (+) Edentulous Upper, Edentulous Lower   Pulmonary COPD, Current Smoker,    breath sounds clear to auscultation       Cardiovascular (-) anginanegative cardio ROS   Rhythm:Regular Rate:Normal     Neuro/Psych negative neurological ROS     GI/Hepatic negative GI ROS, Neg liver ROS,   Endo/Other  negative endocrine ROS  Renal/GU Renal collecting system mass     Musculoskeletal   Abdominal (+) + obese,   Peds  Hematology negative hematology ROS (+)   Anesthesia Other Findings   Reproductive/Obstetrics                             Anesthesia Physical Anesthesia Plan  ASA: II  Anesthesia Plan: General   Post-op Pain Management:    Induction: Intravenous  PONV Risk Score and Plan: 1 and Ondansetron and Dexamethasone  Airway Management Planned: LMA  Additional Equipment:   Intra-op Plan:   Post-operative Plan:   Informed Consent: I have reviewed the patients History and Physical, chart, labs and discussed the procedure including the risks, benefits and alternatives for the proposed anesthesia with the patient or authorized representative who has indicated his/her understanding and acceptance.     Plan Discussed with: CRNA, Surgeon and Anesthesiologist  Anesthesia Plan Comments: (Plan routine monitors, GA- LMA OK)        Anesthesia Quick Evaluation

## 2017-07-15 NOTE — Anesthesia Procedure Notes (Signed)
Procedure Name: Intubation Date/Time: 07/15/2017 1:12 PM Performed by: Murvin Natal, MD Pre-anesthesia Checklist: Patient identified, Emergency Drugs available, Suction available and Patient being monitored Patient Re-evaluated:Patient Re-evaluated prior to induction Oxygen Delivery Method: Circle system utilized Preoxygenation: Pre-oxygenation with 100% oxygen Induction Type: IV induction Ventilation: Mask ventilation without difficulty Laryngoscope Size: Mac and 3 Grade View: Grade I Tube type: Oral Tube size: 7.5 mm Number of attempts: 1 Airway Equipment and Method: Stylet and Oral airway Placement Confirmation: ETT inserted through vocal cords under direct vision,  positive ETCO2 and breath sounds checked- equal and bilateral Secured at: 22 cm Tube secured with: Tape Dental Injury: Teeth and Oropharynx as per pre-operative assessment

## 2017-07-15 NOTE — Anesthesia Postprocedure Evaluation (Signed)
Anesthesia Post Note  Patient: Michael Brady  Procedure(s) Performed: CYSTOSCOPY, ATTEMTED URETEROSCOPY/,STENT PLACEMENT (Left )     Patient location during evaluation: PACU Anesthesia Type: General Level of consciousness: awake and alert Pain management: pain level controlled Vital Signs Assessment: post-procedure vital signs reviewed and stable Respiratory status: spontaneous breathing, nonlabored ventilation, respiratory function stable and patient connected to nasal cannula oxygen Cardiovascular status: blood pressure returned to baseline and stable Postop Assessment: no apparent nausea or vomiting Anesthetic complications: no    Last Vitals:  Vitals:   07/15/17 1445 07/15/17 1554  BP: (!) 164/79 (!) 157/76  Pulse: 67 (!) 51  Resp: (!) 24 20  Temp:  36.7 C  SpO2: 96% 100%    Last Pain:  Vitals:   07/15/17 1554  TempSrc: Oral  PainSc:                  Naveed Humphres P Quinn Quam

## 2017-07-16 ENCOUNTER — Encounter (HOSPITAL_BASED_OUTPATIENT_CLINIC_OR_DEPARTMENT_OTHER): Payer: Self-pay | Admitting: Urology

## 2017-07-22 ENCOUNTER — Encounter (HOSPITAL_BASED_OUTPATIENT_CLINIC_OR_DEPARTMENT_OTHER): Payer: Self-pay | Admitting: *Deleted

## 2017-07-22 ENCOUNTER — Other Ambulatory Visit: Payer: Self-pay

## 2017-07-22 NOTE — Progress Notes (Signed)
SPOKE W/ PT VIA PHONE FOR PRE-OP INTERVIEW.  NPO AFTER MN.  ARRIVE AT [phone].  NEEDS HG.  MAY TAKE PAIN RX IF NEEDED AM DOS W/ SIPS OF WATER.

## 2017-07-25 LAB — POCT HEMOGLOBIN-HEMACUE: HEMOGLOBIN: 14.8 g/dL (ref 13.0–17.0)

## 2017-07-29 ENCOUNTER — Encounter (HOSPITAL_BASED_OUTPATIENT_CLINIC_OR_DEPARTMENT_OTHER)
Admission: RE | Disposition: A | Discharge status: Home / Self Care | Payer: Self-pay | Source: Ambulatory Visit | Attending: Urology

## 2017-07-29 ENCOUNTER — Encounter (HOSPITAL_BASED_OUTPATIENT_CLINIC_OR_DEPARTMENT_OTHER): Payer: Self-pay

## 2017-07-29 ENCOUNTER — Ambulatory Visit (HOSPITAL_BASED_OUTPATIENT_CLINIC_OR_DEPARTMENT_OTHER): Payer: 59 | Admitting: Anesthesiology

## 2017-07-29 ENCOUNTER — Ambulatory Visit (HOSPITAL_BASED_OUTPATIENT_CLINIC_OR_DEPARTMENT_OTHER)
Admission: RE | Admit: 2017-07-29 | Discharge: 2017-07-29 | Disposition: A | Discharge status: Home / Self Care | Payer: 59 | Source: Ambulatory Visit | Attending: Urology | Admitting: Urology

## 2017-07-29 DIAGNOSIS — Z87891 Personal history of nicotine dependence: Secondary | ICD-10-CM | POA: Insufficient documentation

## 2017-07-29 DIAGNOSIS — R310 Gross hematuria: Secondary | ICD-10-CM | POA: Insufficient documentation

## 2017-07-29 DIAGNOSIS — C652 Malignant neoplasm of left renal pelvis: Secondary | ICD-10-CM | POA: Insufficient documentation

## 2017-07-29 DIAGNOSIS — D3012 Benign neoplasm of left renal pelvis: Secondary | ICD-10-CM

## 2017-07-29 DIAGNOSIS — D49512 Neoplasm of unspecified behavior of left kidney: Secondary | ICD-10-CM | POA: Diagnosis present

## 2017-07-29 HISTORY — PX: THULIUM LASER TURP (TRANSURETHRAL RESECTION OF PROSTATE): SHX6744

## 2017-07-29 HISTORY — PX: CYSTOSCOPY WITH URETEROSCOPY AND STENT PLACEMENT: SHX6377

## 2017-07-29 HISTORY — DX: Dysuria: R30.0

## 2017-07-29 LAB — POCT HEMOGLOBIN-HEMACUE: HEMOGLOBIN: 14.8 g/dL (ref 13.0–17.0)

## 2017-07-29 SURGERY — CYSTOURETEROSCOPY, WITH STENT INSERTION
Anesthesia: General | Laterality: Left

## 2017-07-29 MED ORDER — ONDANSETRON HCL 4 MG/2ML IJ SOLN
4.0000 mg | Freq: Once | INTRAMUSCULAR | Status: AC
  Administered 2017-07-29: 4 mg via INTRAVENOUS
  Filled 2017-07-29: qty 2

## 2017-07-29 MED ORDER — SCOPOLAMINE 1 MG/3DAYS TD PT72
MEDICATED_PATCH | TRANSDERMAL | Status: AC
  Filled 2017-07-29: qty 1

## 2017-07-29 MED ORDER — SCOPOLAMINE 1 MG/3DAYS TD PT72
MEDICATED_PATCH | TRANSDERMAL | Status: DC | PRN
  Administered 2017-07-29: 1 via TRANSDERMAL

## 2017-07-29 MED ORDER — DEXAMETHASONE SODIUM PHOSPHATE 10 MG/ML IJ SOLN
INTRAMUSCULAR | Status: AC
  Filled 2017-07-29: qty 1

## 2017-07-29 MED ORDER — ONDANSETRON HCL 4 MG/2ML IJ SOLN
INTRAMUSCULAR | Status: DC | PRN
  Administered 2017-07-29: 4 mg via INTRAVENOUS

## 2017-07-29 MED ORDER — DEXAMETHASONE SODIUM PHOSPHATE 10 MG/ML IJ SOLN
INTRAMUSCULAR | Status: DC | PRN
  Administered 2017-07-29: 10 mg via INTRAVENOUS

## 2017-07-29 MED ORDER — OXYCODONE HCL 5 MG/5ML PO SOLN
5.0000 mg | Freq: Once | ORAL | Status: DC | PRN
  Filled 2017-07-29: qty 5

## 2017-07-29 MED ORDER — ONDANSETRON HCL 4 MG PO TABS: 4.0000 mg | ORAL_TABLET | Freq: Every day | ORAL | 1 refills | Status: DC | PRN

## 2017-07-29 MED ORDER — KETOROLAC TROMETHAMINE 30 MG/ML IJ SOLN
30.0000 mg | Freq: Once | INTRAMUSCULAR | Status: DC | PRN
  Filled 2017-07-29: qty 1

## 2017-07-29 MED ORDER — ONDANSETRON HCL 4 MG/2ML IJ SOLN
INTRAMUSCULAR | Status: AC
  Filled 2017-07-29: qty 2

## 2017-07-29 MED ORDER — HYDROCODONE-ACETAMINOPHEN 5-325 MG PO TABS: 1.0000 | ORAL_TABLET | ORAL | 0 refills | Status: DC | PRN

## 2017-07-29 MED ORDER — CIPROFLOXACIN HCL 500 MG PO TABS: 500.0000 mg | ORAL_TABLET | Freq: Two times a day (BID) | ORAL | 0 refills | Status: DC

## 2017-07-29 MED ORDER — FENTANYL CITRATE (PF) 100 MCG/2ML IJ SOLN
INTRAMUSCULAR | Status: AC
  Filled 2017-07-29: qty 2

## 2017-07-29 MED ORDER — PROPOFOL 10 MG/ML IV BOLUS
INTRAVENOUS | Status: DC | PRN
  Administered 2017-07-29: 10 mg via INTRAVENOUS
  Administered 2017-07-29: 190 mg via INTRAVENOUS

## 2017-07-29 MED ORDER — LIDOCAINE 2% (20 MG/ML) 5 ML SYRINGE
INTRAMUSCULAR | Status: DC | PRN
  Administered 2017-07-29: 60 mg via INTRAVENOUS

## 2017-07-29 MED ORDER — FENTANYL CITRATE (PF) 100 MCG/2ML IJ SOLN
25.0000 ug | INTRAMUSCULAR | Status: DC | PRN
  Administered 2017-07-29: 25 ug via INTRAVENOUS
  Filled 2017-07-29: qty 1

## 2017-07-29 MED ORDER — HYDROCODONE-ACETAMINOPHEN 5-325 MG PO TABS
ORAL_TABLET | ORAL | Status: AC
  Filled 2017-07-29: qty 1

## 2017-07-29 MED ORDER — LIDOCAINE 2% (20 MG/ML) 5 ML SYRINGE
INTRAMUSCULAR | Status: AC
  Filled 2017-07-29: qty 5

## 2017-07-29 MED ORDER — PROPOFOL 10 MG/ML IV BOLUS
INTRAVENOUS | Status: AC
  Filled 2017-07-29: qty 40

## 2017-07-29 MED ORDER — CIPROFLOXACIN IN D5W 400 MG/200ML IV SOLN
400.0000 mg | INTRAVENOUS | Status: AC
  Administered 2017-07-29: 400 mg via INTRAVENOUS
  Filled 2017-07-29: qty 200

## 2017-07-29 MED ORDER — CIPROFLOXACIN IN D5W 400 MG/200ML IV SOLN
INTRAVENOUS | Status: AC
  Filled 2017-07-29: qty 200

## 2017-07-29 MED ORDER — SODIUM CHLORIDE 0.9 % IR SOLN
Status: DC | PRN
  Administered 2017-07-29 (×2): 3000 mL via INTRAVESICAL

## 2017-07-29 MED ORDER — MIDAZOLAM HCL 5 MG/5ML IJ SOLN
INTRAMUSCULAR | Status: DC | PRN
  Administered 2017-07-29: 1 mg via INTRAVENOUS

## 2017-07-29 MED ORDER — HYDROCODONE-ACETAMINOPHEN 5-325 MG PO TABS
1.0000 | ORAL_TABLET | Freq: Once | ORAL | Status: AC
  Administered 2017-07-29: 1 via ORAL
  Filled 2017-07-29: qty 1

## 2017-07-29 MED ORDER — PROMETHAZINE HCL 25 MG/ML IJ SOLN
6.2500 mg | INTRAMUSCULAR | Status: DC | PRN
  Filled 2017-07-29: qty 1

## 2017-07-29 MED ORDER — OXYCODONE HCL 5 MG PO TABS
5.0000 mg | ORAL_TABLET | Freq: Once | ORAL | Status: DC | PRN
  Filled 2017-07-29: qty 1

## 2017-07-29 MED ORDER — FENTANYL CITRATE (PF) 100 MCG/2ML IJ SOLN
INTRAMUSCULAR | Status: DC | PRN
  Administered 2017-07-29 (×4): 25 ug via INTRAVENOUS
  Administered 2017-07-29: 50 ug via INTRAVENOUS
  Administered 2017-07-29 (×2): 25 ug via INTRAVENOUS

## 2017-07-29 MED ORDER — LACTATED RINGERS IV SOLN
INTRAVENOUS | Status: DC
  Administered 2017-07-29 (×2): via INTRAVENOUS
  Filled 2017-07-29: qty 1000

## 2017-07-29 MED ORDER — MIDAZOLAM HCL 2 MG/2ML IJ SOLN
INTRAMUSCULAR | Status: AC
  Filled 2017-07-29: qty 2

## 2017-07-29 SURGICAL SUPPLY — 38 items
272 MICRON LASER FIBER ×3 IMPLANT
365 MICRON OPTICAL FIBER ×3 IMPLANT
BAG DRAIN URO-CYSTO SKYTR STRL (DRAIN) ×3 IMPLANT
BAG URINE DRAINAGE (UROLOGICAL SUPPLIES) ×3 IMPLANT
BASKET ZERO TIP NITINOL 2.4FR (BASKET) ×6 IMPLANT
CATH COUDE FOLEY 2W 5CC 18FR (CATHETERS) IMPLANT
CATH FOLEY 2WAY SLVR  5CC 18FR (CATHETERS)
CATH FOLEY 2WAY SLVR 30CC 20FR (CATHETERS) IMPLANT
CATH FOLEY 2WAY SLVR 5CC 18FR (CATHETERS) IMPLANT
CATH FOLEY 3WAY 30CC 22F (CATHETERS) IMPLANT
CATH URET 5FR 28IN OPEN ENDED (CATHETERS) ×3 IMPLANT
CATH URET DUAL LUMEN 6-10FR 50 (CATHETERS) ×3 IMPLANT
CLOTH BEACON ORANGE TIMEOUT ST (SAFETY) ×3 IMPLANT
ELECT BIVAP BIPO 22/24 DONUT (ELECTROSURGICAL)
ELECT LOOP MED HF 24F 12D (CUTTING LOOP) IMPLANT
ELECTRD BIVAP BIPO 22/24 DONUT (ELECTROSURGICAL) IMPLANT
FIBER LASER FLEXIVA 365 (UROLOGICAL SUPPLIES) ×3 IMPLANT
FIBER LASER TRAC TIP (UROLOGICAL SUPPLIES) IMPLANT
GLOVE BIO SURGEON STRL SZ7.5 (GLOVE) ×3 IMPLANT
GOWN STRL REUS W/ TWL XL LVL3 (GOWN DISPOSABLE) ×1 IMPLANT
GOWN STRL REUS W/TWL XL LVL3 (GOWN DISPOSABLE) ×2
GUIDEWIRE STR DUAL SENSOR (WIRE) ×6 IMPLANT
HOLDER FOLEY CATH W/STRAP (MISCELLANEOUS) IMPLANT
INFUSOR MANOMETER BAG 3000ML (MISCELLANEOUS) ×3 IMPLANT
IV NS IRRIG 3000ML ARTHROMATIC (IV SOLUTION) ×3 IMPLANT
IV SET EXTENSION GRAVITY 40 LF (IV SETS) ×3 IMPLANT
KIT RM TURNOVER CYSTO AR (KITS) ×3 IMPLANT
LASER REVOLIX PROCEDURE (MISCELLANEOUS) ×3 IMPLANT
LOOP CUT BIPOLAR 24F LRG (ELECTROSURGICAL) IMPLANT
MANIFOLD NEPTUNE II (INSTRUMENTS) ×3 IMPLANT
NS IRRIG 500ML POUR BTL (IV SOLUTION) ×3 IMPLANT
PACK CYSTO (CUSTOM PROCEDURE TRAY) ×3 IMPLANT
SHEATH URETERAL 12FRX35CM (MISCELLANEOUS) ×3 IMPLANT
STENT URET 6FRX26 CONTOUR (STENTS) ×3 IMPLANT
SYR 30ML LL (SYRINGE) IMPLANT
SYRINGE IRR TOOMEY STRL 70CC (SYRINGE) ×3 IMPLANT
TUBE CONNECTING 12'X1/4 (SUCTIONS) ×1
TUBE CONNECTING 12X1/4 (SUCTIONS) ×2 IMPLANT

## 2017-07-29 NOTE — Transfer of Care (Signed)
Immediate Anesthesia Transfer of Care Note  Patient: Michael Brady  Procedure(s) Performed: CYSTOSCOPY WITH URETEROSCOPY AND STENT PLACEMENT (Left ) CYSTOSCOPY, LEFT URETEROSCOPY WITH TUMOR BIOPSYTHULLIUM LASER ABLATION OF TUMOR AND LEFT URETERAL STENT EXCHANGE (Left )  Patient Location: PACU  Anesthesia Type:General  Level of Consciousness: drowsy and responds to stimulation  Airway & Oxygen Therapy: Patient Spontanous Breathing and Patient connected to nasal cannula oxygen  Post-op Assessment: Report given to RN  Post vital signs: Reviewed and stable  Last Vitals: 173/91, 81, 26, 100%, 97.8ax Vitals:   07/29/17 1153  BP: 133/80  Pulse: 87  Resp: 16  Temp: 37.1 C  SpO2: 99%    Last Pain:  Vitals:   07/29/17 1153  TempSrc: Oral      Patients Stated Pain Goal: 6 (52/77/82 4235)  Complications: No apparent anesthesia complications

## 2017-07-29 NOTE — Op Note (Signed)
Date of procedure: 07/29/17  Preoperative diagnosis:  1. Left upper tract tumor 2. Gross hematuria  Postoperative diagnosis:  1. Same  Procedure: 1. Cystoscopy 2. Left ureteroscopy 3. Laser ablation of tumor 4. Left retrograde pyelogram with interpretation 5. Left ureteral stent exchange 6 [place] by 26 cm  Surgeon: Baruch Gouty, MD  Anesthesia: General  Complications: None  Intraoperative findings: The patient had a proximally 4 cm tumor that was mainly located in the left lower pole.  This tumor was biopsied.  It was then ablated with the thulium laser.  Left retrograde Polygram procedure did not show any residual filling defects.  The left ureteral stent was in the correct position at the end of the procedure.  EBL: None  Specimens: Left upper tract tumor for pathology  Drains: Left 6 French by 26 cm double-J ureteral stent  Disposition: Stable to the postanesthesia care unit  Indication for procedure: The patient is a 62 y.o. 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  And previous attempt at management, narrowing of the left ureter prevented ureteroscopy.  He presents today after passive dilation of the left ureteral stent for definitive management..  After reviewing the management options for treatment, the patient elected to proceed with the above surgical procedure(s). We have discussed the potential benefits and risks of the procedure, side effects of the proposed treatment, the likelihood of the patient achieving the goals of the procedure, and any potential problems that might occur during the procedure or recuperation. Informed consent has been obtained.  Description of procedure: The patient was met in the preoperative area. All risks, benefits, and indications of the procedure were described in great detail. The patient consented to the procedure. Preoperative antibiotics were  given. The patient was taken to the operative theater. General anesthesia was induced per the anesthesia service. The patient was then placed in the dorsal lithotomy position and prepped and draped in the usual sterile fashion. A preoperative timeout was called.   A 20 French 30 degree cystoscope inserted to patient's bladder per urethra atraumatically.  The.  The placed left ureteral stent was then removed with flexible graspers.  A sensor wire was then inserted into the stent and advanced level of the left renal pelvis under fluoroscopy.  With the aid of a dual lumen catheter at the second wire was then placed up to the level of the left renal pelvis.  Over 1 of the sensor wires next she was placed up to the level of the left renal pelvis under fluoroscopy.  The flexible ureteroscope was inserted through this and advanced until the known tumor was encountered in the left lower pole.  This tumor was biopsied multiple times with the aid of a 0 tip stone basket.  This was sent to pathology.  Then for approximately 1 hour the thulium laser was then used to ablate this to bring the lower pole until there is no visible tumor remaining.  Pan nephroscopy this point revealed no further viable tumor.  Retrograde Polygram was then used to identify the collecting system.  The ureteral axis was then removed under direct visualization showing no significant tumor or trauma.  With the aid of the cystoscope a 6 French by 26 cm double-J ureteral stent was then placed over the remaining sensor wire.  A sensor wire was removed.  Grossly in the patient's renal pelvis under fluoroscopy and the urinary bladder under direct  visualization.  There is then awoken from anesthesia and transferred in stable condition of the pluses care unit.  Plan: The patient will follow-up next week for biopsy results.  If he has a low-grade tumor, he will need repeat ureteroscopy in approximately 3 months.  If he is a high-grade tumor, he will need to  undergo a left nephroureterectomy.  Baruch Gouty, M.D.

## 2017-07-29 NOTE — Anesthesia Procedure Notes (Signed)
Procedure Name: LMA Insertion Date/Time: 07/29/2017 2:18 PM Performed by: Bonney Aid, CRNA Pre-anesthesia Checklist: Patient identified, Emergency Drugs available, Suction available and Patient being monitored Patient Re-evaluated:Patient Re-evaluated prior to induction Oxygen Delivery Method: Circle system utilized Preoxygenation: Pre-oxygenation with 100% oxygen Induction Type: IV induction Ventilation: Mask ventilation without difficulty LMA: LMA inserted LMA Size: 4.0 Number of attempts: 1 Airway Equipment and Method: Bite block Placement Confirmation: positive ETCO2 Tube secured with: Tape Dental Injury: Teeth and Oropharynx as per pre-operative assessment

## 2017-07-29 NOTE — Anesthesia Preprocedure Evaluation (Signed)
Anesthesia Evaluation  Patient identified by MRN, date of birth, ID band Patient awake    Reviewed: Allergy & Precautions, NPO status , Patient's Chart, lab work & pertinent test results  Airway Mallampati: II  TM Distance: >3 FB Neck ROM: Full    Dental no notable dental hx.    Pulmonary Current Smoker,    Pulmonary exam normal breath sounds clear to auscultation       Cardiovascular negative cardio ROS Normal cardiovascular exam Rhythm:Regular Rate:Normal     Neuro/Psych negative neurological ROS  negative psych ROS   GI/Hepatic negative GI ROS, Neg liver ROS,   Endo/Other  negative endocrine ROS  Renal/GU negative Renal ROS  negative genitourinary   Musculoskeletal negative musculoskeletal ROS (+)   Abdominal   Peds negative pediatric ROS (+)  Hematology negative hematology ROS (+)   Anesthesia Other Findings   Reproductive/Obstetrics negative OB ROS                             Anesthesia Physical Anesthesia Plan  ASA: II  Anesthesia Plan: General   Post-op Pain Management:    Induction: Intravenous  PONV Risk Score and Plan: 1 and Ondansetron, Dexamethasone and Treatment may vary due to age or medical condition  Airway Management Planned: LMA  Additional Equipment:   Intra-op Plan:   Post-operative Plan: Extubation in OR  Informed Consent: I have reviewed the patients History and Physical, chart, labs and discussed the procedure including the risks, benefits and alternatives for the proposed anesthesia with the patient or authorized representative who has indicated his/her understanding and acceptance.   Dental advisory given  Plan Discussed with: CRNA and Surgeon  Anesthesia Plan Comments:         Anesthesia Quick Evaluation

## 2017-07-29 NOTE — Interval H&P Note (Signed)
History and Physical Interval Note:  07/29/2017 1:28 PM  Michael Brady  has presented today for surgery, with the diagnosis of LEFT RENAL COLLECTING SYSTEM MASS  The various methods of treatment have been discussed with the patient and family. After consideration of risks, benefits and other options for treatment, the patient has consented to  Procedure(s): CYSTOSCOPY WITH URETEROSCOPY AND STENT PLACEMENT (Left) CYSTOSCOPY, LEFT URETEROSCOPY WITH TUMOR BIOPSYTHULLIUM LASER ABLATION OF TUMOR AND LEFT URETERAL STENT EXCHANGE (Left) as a surgical intervention .  The patient's history has been reviewed, patient examined, no change in status, stable for surgery.  I have reviewed the patient's chart and labs.  Questions were answered to the patient's satisfaction.     [name]

## 2017-07-29 NOTE — Discharge Instructions (Signed)
°  Post Anesthesia Home Care Instructions ° °Activity: °Get plenty of rest for the remainder of the day. A responsible individual must stay with you for 24 hours following the procedure.  °For the next 24 hours, DO NOT: °-Drive a car °-Operate machinery °-Drink alcoholic beverages °-Take any medication unless instructed by your physician °-Make any legal decisions or sign important papers. ° °Meals: °Start with liquid foods such as gelatin or soup. Progress to regular foods as tolerated. Avoid greasy, spicy, heavy foods. If nausea and/or vomiting occur, drink only clear liquids until the nausea and/or vomiting subsides. Call your physician if vomiting continues. ° °Special Instructions/Symptoms: °Your throat may feel dry or sore from the anesthesia or the breathing tube placed in your throat during surgery. If this causes discomfort, gargle with warm salt water. The discomfort should disappear within 24 hours. ° °If you had a scopolamine patch placed behind your ear for the management of post- operative nausea and/or vomiting: ° °1. The medication in the patch is effective for 72 hours, after which it should be removed.  Wrap patch in a tissue and discard in the trash. Wash hands thoroughly with soap and water. °2. You may remove the patch earlier than 72 hours if you experience unpleasant side effects which may include dry mouth, dizziness or visual disturbances. °3. Avoid touching the patch. Wash your hands with soap and water after contact with the patch. °  °CYSTOSCOPY HOME CARE INSTRUCTIONS ° °Activity: °Rest for the remainder of the day.  Do not drive or operate equipment today.  You may resume normal activities in one to two days as instructed by your physician.  ° °Meals: °Drink plenty of liquids and eat light foods such as gelatin or soup this evening.  You may return to a normal meal plan tomorrow. ° °Return to Work: °You may return to work in one to two days or as instructed by your physician. ° °Special  Instructions / Symptoms: °Call your physician if any of these symptoms occur: ° ° -persistent or heavy bleeding ° -bleeding which continues after first few urination ° -large blood clots that are difficult to pass ° -urine stream diminishes or stops completely ° -fever equal to or higher than 101 degrees Farenheit. ° -cloudy urine with a strong, foul odor ° -severe pain ° °Females should always wipe from front to back after elimination.  You may feel some burning pain when you urinate.  This should disappear with time.  Applying moist heat to the lower abdomen or a hot tub bath may help relieve the pain. \ ° °Follow-Up / Date of Return Visit to Your Physician:   ° °Call for an appointment to arrange follow-up. ° °Patient Signature:  ________________________________________________________ ° °Nurse's Signature:  ________________________________________________________ ° °

## 2017-07-29 NOTE — Progress Notes (Signed)
Spoke with Dr. Pilar Jarvis and he send in prescription for zofran. He stated that the Directions on the bottle will say to take 1 daily, however patient can take it every 8 hours instead. Pt instructed that he can take 1 every 8 hours instead of just 1 daily.

## 2017-07-30 ENCOUNTER — Encounter (HOSPITAL_BASED_OUTPATIENT_CLINIC_OR_DEPARTMENT_OTHER): Payer: Self-pay | Admitting: Urology

## 2017-07-31 ENCOUNTER — Other Ambulatory Visit: Payer: Self-pay

## 2017-07-31 ENCOUNTER — Encounter (HOSPITAL_COMMUNITY): Payer: Self-pay | Admitting: *Deleted

## 2017-07-31 ENCOUNTER — Emergency Department (HOSPITAL_COMMUNITY)
Admission: EM | Admit: 2017-07-31 | Discharge: 2017-07-31 | Disposition: A | Discharge status: Home / Self Care | Payer: 59 | Attending: Emergency Medicine | Admitting: Emergency Medicine

## 2017-07-31 DIAGNOSIS — R339 Retention of urine, unspecified: Secondary | ICD-10-CM | POA: Insufficient documentation

## 2017-07-31 DIAGNOSIS — F17210 Nicotine dependence, cigarettes, uncomplicated: Secondary | ICD-10-CM | POA: Insufficient documentation

## 2017-07-31 DIAGNOSIS — Z79899 Other long term (current) drug therapy: Secondary | ICD-10-CM | POA: Insufficient documentation

## 2017-07-31 DIAGNOSIS — R310 Gross hematuria: Secondary | ICD-10-CM | POA: Insufficient documentation

## 2017-07-31 LAB — URINALYSIS, ROUTINE W REFLEX MICROSCOPIC: Bilirubin Urine: NEGATIVE

## 2017-07-31 LAB — URINALYSIS, MICROSCOPIC (REFLEX)

## 2017-07-31 MED ORDER — CIPROFLOXACIN HCL 250 MG PO TABS
500.0000 mg | ORAL_TABLET | Freq: Once | ORAL | Status: DC
  Filled 2017-07-31: qty 2

## 2017-07-31 NOTE — Anesthesia Postprocedure Evaluation (Signed)
Anesthesia Post Note  Patient: Michael Brady  Procedure(s) Performed: CYSTOSCOPY WITH URETEROSCOPY AND STENT PLACEMENT (Left ) CYSTOSCOPY, LEFT URETEROSCOPY WITH TUMOR BIOPSYTHULLIUM LASER ABLATION OF TUMOR AND LEFT URETERAL STENT EXCHANGE (Left )     Patient location during evaluation: PACU Anesthesia Type: General Level of consciousness: awake and alert Pain management: pain level controlled Vital Signs Assessment: post-procedure vital signs reviewed and stable Respiratory status: spontaneous breathing, nonlabored ventilation, respiratory function stable and patient connected to nasal cannula oxygen Cardiovascular status: blood pressure returned to baseline and stable Postop Assessment: no apparent nausea or vomiting Anesthetic complications: no    Last Vitals:  Vitals:   07/29/17 1630 07/29/17 1854  BP: (!) 189/107 137/80  Pulse: 75 90  Resp: 17 20  Temp:  37.4 C  SpO2: 96% 95%    Last Pain:  Vitals:   07/29/17 1854  TempSrc: Oral  PainSc: [place]

## 2017-07-31 NOTE — Discharge Instructions (Signed)
Try to drink more fluids to the blood will be less likely to clot again. Recheck if the catheter stops draining urine. You have bacteria in your urine so make sure you take the antibiotic Dr Pilar Jarvis ordered for you. Call his office tomorrow to let him know about your ED visit. He may just have you keep your appointment for next Monday the 28th or he may want you to be seen sooner.

## 2017-07-31 NOTE — ED Triage Notes (Signed)
Pt states it has been over 12 hours since he has been able to urinate; pt states when he tries to urinate now only a few drops of blood come out; pt had a urinary procedure done yesterday

## 2017-07-31 NOTE — ED Notes (Signed)
Pt alert & oriented x4, stable gait. Patient given discharge instructions, paperwork & prescription(s). Patient  instructed to stop at the registration desk to finish any additional paperwork. Patient verbalized understanding. Pt left department w/ no further questions. 

## 2017-07-31 NOTE — ED Provider Notes (Signed)
[facility] EMERGENCY DEPARTMENT Provider Note   CSN: [id number] Arrival date & time: 07/31/17  0112     History   Chief Complaint Chief Complaint  Patient presents with  . Urinary Retention    HPI Michael Brady is a 62 y.o. male.  HPI patient reports he had surgery on Monday, January 21.  Reading the op report he had cystoscopy and left ureteroscopy for a tumor in his left lower pole of his kidney.  He had biopsy and then laser removal of the tumor.  Patient states he had a stent placed.  He reports he has been having bloody urine off and on for the couple months however it has been worse since his procedure.  He states the last time he was able to urinate was around lunchtime on January 22.  Around 9 PM he felt the constant urge to go urinate and was only dribbling a few drops of blood.  He states he did not have abdominal pain but just an urge to urinate, he denies nausea, vomiting, or fever.  He has a follow-up appointment with his urologist on the 28th.  PCP Mikey Kirschner, MD Urology Dr Pilar Jarvis  Past Medical History:  Diagnosis Date  . Diverticulosis of colon   . Dysuria   . History of closed dislocation of shoulder 06/2014   LEFT --- POST IV SEDATION CLOSED REDUCTION IN ED  . Renal mass, left    LEFT RENAL COLLECTING SYSTEM MASS  . Smokers' cough ([place])   . Wears glasses     Patient Active Problem List   Diagnosis Date Noted  . Special screening for malignant neoplasms, colon   . Diverticulosis of large intestine without diverticulitis   . Residual hemorrhoidal skin tags   . Hyperlipidemia LDL goal <130 04/03/2017  . Anterior shoulder dislocation     Past Surgical History:  Procedure Laterality Date  . CARPAL TUNNEL RELEASE Left 01-25-2006   dr [facility]   and LEFT THUMB PULLEY RELEASE  . COLONOSCOPY N/A 05/07/2017   Procedure: COLONOSCOPY;  Surgeon: Aviva Signs, MD;  Location: [facility];  Service: Gastroenterology;  Laterality: N/A;  . CYSTOSCOPY  WITH URETEROSCOPY AND STENT PLACEMENT Left 07/29/2017   Procedure: CYSTOSCOPY WITH URETEROSCOPY AND STENT PLACEMENT;  Surgeon: [name], MD;  Location[facility];  Service: Urology;  Laterality: Left;  . CYSTOSCOPY/URETEROSCOPY/HOLMIUM LASER/STENT PLACEMENT Left 07/15/2017   Procedure: CYSTOSCOPY, ATTEMTED URETEROSCOPY/,STENT PLACEMENT;  Surgeon: [name], MD;  Location[facility];  Service: Urology;  Laterality: Left;  . SHOULDER SURGERY Right 1997  . THULIUM LASER TURP (TRANSURETHRAL RESECTION OF PROSTATE) Left 07/29/2017   Procedure: CYSTOSCOPY, LEFT URETEROSCOPY WITH TUMOR BIOPSYTHULLIUM LASER ABLATION OF TUMOR AND LEFT URETERAL STENT EXCHANGE;  Surgeon: [name], MD;  Location: [facility];  Service: Urology;  Laterality: Left;       Home Medications    Prior to Admission medications   Medication Sig Start Date End Date Taking? Authorizing Provider  ciprofloxacin (CIPRO) 500 MG tablet Take 1 tablet (500 mg total) by mouth 2 (two) times daily. 07/29/17   [name], MD  HYDROcodone-acetaminophen (NORCO/VICODIN) 5-325 MG tablet Take 1 tablet by mouth every 4 (four) hours as needed for moderate pain. 07/29/17 07/29/18  [name], MD  Multiple Vitamins-Minerals (MENS MULTIVITAMIN PLUS PO) Take 1 tablet by mouth daily.    [provider]  naproxen sodium (ANAPROX) 220 MG tablet Take 440 mg  by mouth every morning.     [provider]  ondansetron (ZOFRAN) 4 MG tablet Take 1 tablet (4 mg total) by mouth daily as needed for nausea or vomiting. 07/29/17 07/29/18  [name], MD    Family History Family History  Problem Relation Age of Onset  . Colon cancer Neg Hx     Social History Social History   Tobacco Use  . Smoking status: Current Every Day Smoker    Packs/day: 1.50    Years: 40.00    Pack years: 60.00    Types: Cigarettes  . Smokeless tobacco: Never Used    Substance Use Topics  . Alcohol use: No  . Drug use: No  lives with wife   Allergies   Penicillins   Review of Systems Review of Systems  All other systems reviewed and are negative.    Physical Exam Updated Vital Signs BP 128/88 (BP Location: Right Arm)   Pulse 89   Temp 98.4 F (36.9 C) (Oral)   Resp 20   Ht 5' 8.5" (1.74 m)   Wt 72.1 kg (159 lb)   SpO2 99%   BMI 23.82 kg/m   Vital signs normal    Physical Exam  Constitutional: He is oriented to person, place, and time. He appears well-developed and well-nourished. No distress.  Patient examined after Foley catheter placed by nursing staff  HENT:  Head: Normocephalic and atraumatic.  Right Ear: External ear normal.  Left Ear: External ear normal.  Eyes: Conjunctivae and EOM are normal.  Neck: Normal range of motion.  Cardiovascular: Normal rate.  Pulmonary/Chest: Effort normal. No stridor.  Abdominal: Soft. Bowel sounds are normal. He exhibits no distension. There is tenderness.  Patient's Foley catheter has grossly bloody urine  Musculoskeletal: Normal range of motion.  Neurological: He is alert and oriented to person, place, and time. No cranial nerve deficit.  Skin: Skin is warm and dry. No erythema.  Psychiatric: He has a normal mood and affect. His behavior is normal. Thought content normal.  Nursing note and vitals reviewed.    ED Treatments / Results  Labs (all labs ordered are listed, but only abnormal results are displayed) Results for orders placed or performed during the hospital encounter of 07/31/17  Urinalysis, Routine w reflex microscopic  Result Value Ref Range   Color, Urine RED (A) YELLOW   APPearance TURBID (A) CLEAR   Specific Gravity, Urine  1.005 - 1.030    TEST NOT REPORTED DUE TO COLOR INTERFERENCE OF URINE PIGMENT   pH  5.0 - 8.0    TEST NOT REPORTED DUE TO COLOR INTERFERENCE OF URINE PIGMENT   Glucose, UA (A) NEGATIVE mg/dL    TEST NOT REPORTED DUE TO COLOR INTERFERENCE  OF URINE PIGMENT   Hgb urine dipstick (A) NEGATIVE    TEST NOT REPORTED DUE TO COLOR INTERFERENCE OF URINE PIGMENT   Bilirubin Urine NEGATIVE NEGATIVE   Ketones, ur (A) NEGATIVE mg/dL    TEST NOT REPORTED DUE TO COLOR INTERFERENCE OF URINE PIGMENT   Protein, ur (A) NEGATIVE mg/dL    TEST NOT REPORTED DUE TO COLOR INTERFERENCE OF URINE PIGMENT   Nitrite (A) NEGATIVE    TEST NOT REPORTED DUE TO COLOR INTERFERENCE OF URINE PIGMENT   Leukocytes, UA (A) NEGATIVE    TEST NOT REPORTED DUE TO COLOR INTERFERENCE OF URINE PIGMENT  Urinalysis, Microscopic (reflex)  Result Value Ref Range   RBC / HPF TOO NUMEROUS TO COUNT 0 - 5 RBC/hpf  WBC, UA 0-5 0 - 5 WBC/hpf   Bacteria, UA MANY (A) NONE SEEN   Squamous Epithelial / LPF 0-5 (A) NONE SEEN   Laboratory interpretation all normal except gross hematuria and bacteriuria    EKG  EKG Interpretation None       Radiology No results found.  Procedures Procedures (including critical care time)  Medications Ordered in ED Medications - No data to display   Initial Impression / Assessment and Plan / ED Course  I have reviewed the triage vital signs and the nursing notes.  Pertinent labs & imaging results that were available during my care of the patient were reviewed by me and considered in my medical decision making (see chart for details).     Bladder scan showed over 700 cc of urine.  Nursing staff placed a Foley catheter with quick return of over 600 cc of grossly bloody urine.  Patient reports relief of discomfort.  After reviewing his urine I was going to start him on Cipro however patient has already been on Cipro since the 21st.  He states he did take his evening dose at 11 PM.  He was discharged home with his Foley catheter, he was told to drink more fluids to hopefully prevent the blood from clotting again.  He has an appointment of his urologist on the 28th, however he should call and let them know about his ED visit tonight.  He  should return to the ED if his catheter stops draining.  Final Clinical Impressions(s) / ED Diagnoses   Final diagnoses:  Urinary retention  Gross hematuria    ED Discharge Orders    None      Plan discharge  Rolland Porter, MD, Barbette Or, MD 07/31/17 [phone]

## 2017-08-01 ENCOUNTER — Encounter (HOSPITAL_BASED_OUTPATIENT_CLINIC_OR_DEPARTMENT_OTHER): Payer: Self-pay | Admitting: Urology

## 2017-09-03 ENCOUNTER — Other Ambulatory Visit: Payer: Self-pay | Admitting: Urology

## 2017-10-11 ENCOUNTER — Encounter (HOSPITAL_BASED_OUTPATIENT_CLINIC_OR_DEPARTMENT_OTHER): Payer: Self-pay | Admitting: Emergency Medicine

## 2017-10-11 ENCOUNTER — Other Ambulatory Visit: Payer: Self-pay

## 2017-10-11 NOTE — Progress Notes (Signed)
SPOKE WITH: PATIENT  RIDING HOME WITH: WIFE SANDRA [phone] AM MEDICATIONS: NONE  NPO STATUS: NPO AFTER 12AM  LABS: PER ANESTHESIA  COMMENTS/CONCERNS: ADVISED NO SMOKING X24 HOURS BEFORE SURGERY .   DURING PRE-OP CALL INTERVIEW, PATIENT REPORTS HEMATURIA THIS MORNING AFTER NOT HAVING ANY SINCE AFTER SURGERY IN [place]. DENIES ANY OTHER URINARY SYMPTOMS BUT ASKING IF HE NEEDS TO BE PUT BACK ON ANTIBIOTICS. HE ALSO STATES HE JUST HAD BLOOD WORK DONE AT [facility] THIS LAST Monday BUT HASN'T HEARD BACK FROM THEM ABOUT RESULTS. RN ADVISES HE F/U WITH SURGEON OFFICE TO MAKE THEM AWARE OF HEMATURIA. PATIENT VERBALIZED UNDERSTANDING .

## 2017-10-21 ENCOUNTER — Other Ambulatory Visit: Payer: Self-pay

## 2017-10-21 ENCOUNTER — Ambulatory Visit (HOSPITAL_BASED_OUTPATIENT_CLINIC_OR_DEPARTMENT_OTHER): Payer: No Typology Code available for payment source | Admitting: Anesthesiology

## 2017-10-21 ENCOUNTER — Encounter (HOSPITAL_BASED_OUTPATIENT_CLINIC_OR_DEPARTMENT_OTHER)
Admission: RE | Disposition: A | Discharge status: Home / Self Care | Payer: Self-pay | Source: Ambulatory Visit | Attending: Urology

## 2017-10-21 ENCOUNTER — Ambulatory Visit (HOSPITAL_BASED_OUTPATIENT_CLINIC_OR_DEPARTMENT_OTHER)
Admission: RE | Admit: 2017-10-21 | Discharge: 2017-10-21 | Disposition: A | Discharge status: Home / Self Care | Payer: No Typology Code available for payment source | Source: Ambulatory Visit | Attending: Urology | Admitting: Urology

## 2017-10-21 ENCOUNTER — Encounter (HOSPITAL_BASED_OUTPATIENT_CLINIC_OR_DEPARTMENT_OTHER): Payer: Self-pay | Admitting: Anesthesiology

## 2017-10-21 DIAGNOSIS — Z880 Allergy status to penicillin: Secondary | ICD-10-CM | POA: Diagnosis not present

## 2017-10-21 DIAGNOSIS — Z87891 Personal history of nicotine dependence: Secondary | ICD-10-CM | POA: Insufficient documentation

## 2017-10-21 DIAGNOSIS — C642 Malignant neoplasm of left kidney, except renal pelvis: Secondary | ICD-10-CM | POA: Diagnosis not present

## 2017-10-21 DIAGNOSIS — R310 Gross hematuria: Secondary | ICD-10-CM | POA: Insufficient documentation

## 2017-10-21 DIAGNOSIS — C649 Malignant neoplasm of unspecified kidney, except renal pelvis: Secondary | ICD-10-CM

## 2017-10-21 DIAGNOSIS — Z79899 Other long term (current) drug therapy: Secondary | ICD-10-CM | POA: Diagnosis not present

## 2017-10-21 HISTORY — DX: Other specified postprocedural states: Z98.890

## 2017-10-21 HISTORY — PX: CYSTOSCOPY/URETEROSCOPY/HOLMIUM LASER/STENT PLACEMENT: SHX6546

## 2017-10-21 HISTORY — PX: HOLMIUM LASER APPLICATION: SHX5852

## 2017-10-21 HISTORY — DX: Other specified postprocedural states: R11.2

## 2017-10-21 HISTORY — DX: Hematuria, unspecified: R31.9

## 2017-10-21 HISTORY — DX: Presence of dental prosthetic device (complete) (partial): Z97.2

## 2017-10-21 SURGERY — CYSTOSCOPY/URETEROSCOPY/HOLMIUM LASER/STENT PLACEMENT
Anesthesia: General | Site: Renal | Laterality: Left

## 2017-10-21 MED ORDER — KETOROLAC TROMETHAMINE 30 MG/ML IJ SOLN
30.0000 mg | Freq: Once | INTRAMUSCULAR | Status: DC | PRN
  Filled 2017-10-21: qty 1

## 2017-10-21 MED ORDER — PROPOFOL 10 MG/ML IV BOLUS
INTRAVENOUS | Status: DC | PRN
  Administered 2017-10-21: 200 mg via INTRAVENOUS

## 2017-10-21 MED ORDER — DEXAMETHASONE SODIUM PHOSPHATE 10 MG/ML IJ SOLN
INTRAMUSCULAR | Status: DC | PRN
  Administered 2017-10-21: 10 mg via INTRAVENOUS

## 2017-10-21 MED ORDER — FENTANYL CITRATE (PF) 100 MCG/2ML IJ SOLN
INTRAMUSCULAR | Status: DC | PRN
  Administered 2017-10-21: 25 ug via INTRAVENOUS
  Administered 2017-10-21: 50 ug via INTRAVENOUS
  Administered 2017-10-21: 25 ug via INTRAVENOUS

## 2017-10-21 MED ORDER — DEXAMETHASONE SODIUM PHOSPHATE 10 MG/ML IJ SOLN
INTRAMUSCULAR | Status: AC
  Filled 2017-10-21: qty 1

## 2017-10-21 MED ORDER — KETOROLAC TROMETHAMINE 30 MG/ML IJ SOLN
INTRAMUSCULAR | Status: DC | PRN
  Administered 2017-10-21: 30 mg via INTRAVENOUS

## 2017-10-21 MED ORDER — ONDANSETRON HCL 4 MG/2ML IJ SOLN
INTRAMUSCULAR | Status: AC
  Filled 2017-10-21: qty 2

## 2017-10-21 MED ORDER — FENTANYL CITRATE (PF) 100 MCG/2ML IJ SOLN
25.0000 ug | INTRAMUSCULAR | Status: DC | PRN
  Filled 2017-10-21: qty 1

## 2017-10-21 MED ORDER — LIDOCAINE 2% (20 MG/ML) 5 ML SYRINGE
INTRAMUSCULAR | Status: DC | PRN
  Administered 2017-10-21: 60 mg via INTRAVENOUS

## 2017-10-21 MED ORDER — KETOROLAC TROMETHAMINE 30 MG/ML IJ SOLN
INTRAMUSCULAR | Status: AC
  Filled 2017-10-21: qty 1

## 2017-10-21 MED ORDER — ONDANSETRON HCL 4 MG/2ML IJ SOLN
INTRAMUSCULAR | Status: DC | PRN
  Administered 2017-10-21: 4 mg via INTRAVENOUS

## 2017-10-21 MED ORDER — FENTANYL CITRATE (PF) 100 MCG/2ML IJ SOLN
INTRAMUSCULAR | Status: AC
  Filled 2017-10-21: qty 2

## 2017-10-21 MED ORDER — CIPROFLOXACIN IN D5W 400 MG/200ML IV SOLN
400.0000 mg | INTRAVENOUS | Status: AC
  Administered 2017-10-21: 400 mg via INTRAVENOUS
  Filled 2017-10-21: qty 200

## 2017-10-21 MED ORDER — MIDAZOLAM HCL 2 MG/2ML IJ SOLN
INTRAMUSCULAR | Status: DC | PRN
  Administered 2017-10-21: 2 mg via INTRAVENOUS

## 2017-10-21 MED ORDER — CIPROFLOXACIN HCL 500 MG PO TABS: 500.0000 mg | ORAL_TABLET | Freq: Two times a day (BID) | ORAL | 0 refills | Status: DC

## 2017-10-21 MED ORDER — SCOPOLAMINE 1 MG/3DAYS TD PT72
MEDICATED_PATCH | TRANSDERMAL | Status: AC
  Filled 2017-10-21: qty 1

## 2017-10-21 MED ORDER — PROMETHAZINE HCL 25 MG/ML IJ SOLN
6.2500 mg | INTRAMUSCULAR | Status: DC | PRN
  Filled 2017-10-21: qty 1

## 2017-10-21 MED ORDER — SODIUM CHLORIDE 0.9 % IR SOLN
Status: DC | PRN
  Administered 2017-10-21: 4000 mL

## 2017-10-21 MED ORDER — IOHEXOL 300 MG/ML  SOLN
INTRAMUSCULAR | Status: DC | PRN
  Administered 2017-10-21: 13 mL

## 2017-10-21 MED ORDER — MIDAZOLAM HCL 2 MG/2ML IJ SOLN
INTRAMUSCULAR | Status: AC
  Filled 2017-10-21: qty 2

## 2017-10-21 MED ORDER — LIDOCAINE 2% (20 MG/ML) 5 ML SYRINGE
INTRAMUSCULAR | Status: AC
  Filled 2017-10-21: qty 5

## 2017-10-21 MED ORDER — HYDROCODONE-ACETAMINOPHEN 5-325 MG PO TABS: 1.0000 | ORAL_TABLET | ORAL | 0 refills | Status: DC | PRN

## 2017-10-21 MED ORDER — PROPOFOL 10 MG/ML IV BOLUS
INTRAVENOUS | Status: AC
  Filled 2017-10-21: qty 40

## 2017-10-21 MED ORDER — CIPROFLOXACIN IN D5W 400 MG/200ML IV SOLN
INTRAVENOUS | Status: AC
  Filled 2017-10-21: qty 200

## 2017-10-21 MED ORDER — LACTATED RINGERS IV SOLN
INTRAVENOUS | Status: DC
  Administered 2017-10-21: 09:00:00 via INTRAVENOUS
  Filled 2017-10-21: qty 1000

## 2017-10-21 SURGICAL SUPPLY — 26 items
BAG DRAIN URO-CYSTO SKYTR STRL (DRAIN) ×3 IMPLANT
BASKET ZERO TIP NITINOL 2.4FR (BASKET) ×6 IMPLANT
CATH URET 5FR 28IN OPEN ENDED (CATHETERS) ×3 IMPLANT
CATH URET DUAL LUMEN 6-10FR 50 (CATHETERS) ×3 IMPLANT
CLOTH BEACON ORANGE TIMEOUT ST (SAFETY) ×3 IMPLANT
FIBER LASER FLEXIVA 365 (UROLOGICAL SUPPLIES) ×3 IMPLANT
FIBER LASER TRAC TIP (UROLOGICAL SUPPLIES) ×3 IMPLANT
GLOVE BIO SURGEON STRL SZ 6.5 (GLOVE) ×2 IMPLANT
GLOVE BIO SURGEON STRL SZ7.5 (GLOVE) ×3 IMPLANT
GLOVE BIO SURGEONS STRL SZ 6.5 (GLOVE) ×1
GLOVE BIOGEL PI IND STRL 6.5 (GLOVE) ×2 IMPLANT
GLOVE BIOGEL PI INDICATOR 6.5 (GLOVE) ×4
GOWN STRL REUS W/ TWL XL LVL3 (GOWN DISPOSABLE) ×1 IMPLANT
GOWN STRL REUS W/TWL LRG LVL3 (GOWN DISPOSABLE) ×3 IMPLANT
GOWN STRL REUS W/TWL XL LVL3 (GOWN DISPOSABLE) ×2
GUIDEWIRE STR DUAL SENSOR (WIRE) ×6 IMPLANT
INFUSOR MANOMETER BAG 3000ML (MISCELLANEOUS) ×3 IMPLANT
IV NS IRRIG 3000ML ARTHROMATIC (IV SOLUTION) ×3 IMPLANT
KIT TURNOVER CYSTO (KITS) ×3 IMPLANT
MANIFOLD NEPTUNE II (INSTRUMENTS) ×3 IMPLANT
NS IRRIG 500ML POUR BTL (IV SOLUTION) ×3 IMPLANT
PACK CYSTO (CUSTOM PROCEDURE TRAY) ×3 IMPLANT
SHEATH URET ACCESS 12FR/35CM (UROLOGICAL SUPPLIES) ×3 IMPLANT
STENT URET 6FRX26 CONTOUR (STENTS) ×3 IMPLANT
TUBE CONNECTING 12'X1/4 (SUCTIONS) ×1
TUBE CONNECTING 12X1/4 (SUCTIONS) ×2 IMPLANT

## 2017-10-21 NOTE — Discharge Instructions (Signed)
[facility] [phone] Post Ureteroscopy With or Without Stent Instructions  Definitions:  Ureter: The duct that transports urine from the kidney to the bladder. Stent:   A plastic hollow tube that is placed into the ureter, from the kidney to the  bladder to prevent the ureter from swelling shut.  GENERAL INSTRUCTIONS:  Despite the fact that no skin incisions were used, the area around the ureter and bladder is raw and irritated. The stent is a foreign body which will further irritate the bladder wall. This irritation is manifested by increased frequency of urination, both day and night, and by an increase in the urge to urinate. In some, the urge to urinate is present almost always. Sometimes the urge is strong enough that you may not be able to stop yourself from urinating. The only real cure is to remove the stent and then give time for the bladder wall to heal which can't be done until the danger of the ureter swelling shut has passed, which varies.  You may see some blood in your urine while the stent is in place and a few days afterwards. Do not be alarmed, even if the urine was clear for a while. Get off your feet and drink lots of fluids until clearing occurs. If you start to pass clots or don't improve, call us.  DIET: You may return to your normal diet immediately. Because of the raw surface of your bladder, alcohol, spicy foods, acid type foods and drinks with caffeine may cause irritation or frequency and should be used in moderation. To keep your urine flowing freely and to avoid constipation, drink plenty of fluids during the day ( 8-10 glasses ). Tip: Avoid cranberry juice because it is very acidic.  ACTIVITY: Your physical activity doesn't need to be restricted. However, if you are very active, you may see some blood in your urine. We suggest that you reduce your activity under these circumstances until the bleeding has stopped.  BOWELS: It is important to  keep your bowels regular during the postoperative period. Straining with bowel movements can cause bleeding. A bowel movement every other day is reasonable. Use a mild laxative if needed, such as Milk of Magnesia 2-3 tablespoons, or 2 Dulcolax tablets. Call if you continue to have problems. If you have been taking narcotics for pain, before, during or after your surgery, you may be constipated. Take a laxative if necessary.   MEDICATION: You should resume your pre-surgery medications unless told not to. In addition you will often be given an antibiotic to prevent infection. These should be taken as prescribed until the bottles are finished unless you are having an unusual reaction to one of the drugs.  PROBLEMS YOU SHOULD REPORT TO [place]:  Fevers over 100.5 Fahrenheit.  Heavy bleeding, or clots ( See above notes about blood in urine ).  Inability to urinate.  Drug reactions ( hives, rash, nausea, vomiting, diarrhea ).  Severe burning or pain with urination that is not improving.  Post Anesthesia Home Care Instructions  Activity: Get plenty of rest for the remainder of the day. A responsible individual must stay with you for 24 hours following the procedure.  For the next 24 hours, DO NOT: -Drive a car -Paediatric nurse -Drink alcoholic beverages -Take any medication unless instructed by your physician -Make any legal decisions or sign important papers.  Meals: Start with liquid foods such as gelatin or soup. Progress to regular foods as tolerated. Avoid greasy, spicy, heavy foods. If  nausea and/or vomiting occur, drink only clear liquids until the nausea and/or vomiting subsides. Call your physician if vomiting continues.  Special Instructions/Symptoms: Your throat may feel dry or sore from the anesthesia or the breathing tube placed in your throat during surgery. If this causes discomfort, gargle with warm salt water. The discomfort should disappear within 24 hours.  If you had a  scopolamine patch placed behind your ear for the management of post- operative nausea and/or vomiting:  1. The medication in the patch is effective for 72 hours, after which it should be removed.  Wrap patch in a tissue and discard in the trash. Wash hands thoroughly with soap and water. 2. You may remove the patch earlier than 72 hours if you experience unpleasant side effects which may include dry mouth, dizziness or visual disturbances. 3. Avoid touching the patch. Wash your hands with soap and water after contact with the patch.

## 2017-10-21 NOTE — Anesthesia Preprocedure Evaluation (Addendum)
Anesthesia Evaluation  Patient identified by MRN, date of birth, ID band Patient awake    Reviewed: Allergy & Precautions, NPO status , Patient's Chart, lab work & pertinent test results  History of Anesthesia Complications (+) PONV  Airway Mallampati: II  TM Distance: >3 FB Neck ROM: Full    Dental no notable dental hx. (+) Edentulous Lower, Edentulous Upper, Dental Advisory Given   Pulmonary neg pulmonary ROS, Current Smoker,    Pulmonary exam normal breath sounds clear to auscultation       Cardiovascular negative cardio ROS Normal cardiovascular exam Rhythm:Regular Rate:Normal     Neuro/Psych negative neurological ROS  negative psych ROS   GI/Hepatic negative GI ROS, Neg liver ROS,   Endo/Other  negative endocrine ROS  Renal/GU negative Renal ROS  negative genitourinary   Musculoskeletal negative musculoskeletal ROS (+)   Abdominal   Peds negative pediatric ROS (+)  Hematology negative hematology ROS (+)   Anesthesia Other Findings   Reproductive/Obstetrics negative OB ROS                            Anesthesia Physical Anesthesia Plan  ASA: II  Anesthesia Plan: General   Post-op Pain Management:    Induction: Intravenous  PONV Risk Score and Plan: 2 and Ondansetron, Dexamethasone and Treatment may vary due to age or medical condition  Airway Management Planned: LMA  Additional Equipment:   Intra-op Plan:   Post-operative Plan: Extubation in OR  Informed Consent: I have reviewed the patients History and Physical, chart, labs and discussed the procedure including the risks, benefits and alternatives for the proposed anesthesia with the patient or authorized representative who has indicated his/her understanding and acceptance.   Dental advisory given  Plan Discussed with: CRNA and Surgeon  Anesthesia Plan Comments:         Anesthesia Quick Evaluation

## 2017-10-21 NOTE — Interval H&P Note (Signed)
History and Physical Interval Note:  10/21/2017 8:12 AM  Michael Brady  has presented today for surgery, with the diagnosis of LEFT UPPER TRACT TRANSITIONAL CELL CARCINOMA  The various methods of treatment have been discussed with the patient and family. After consideration of risks, benefits and other options for treatment, the patient has consented to  Procedure(s): CYSTOSCOPY/URETEROSCOPY/HOLMIUM LASER/STENT PLACEMENT (Left) as a surgical intervention .  The patient's history has been reviewed, patient examined, no change in status, stable for surgery.  I have reviewed the patient's chart and labs.  Questions were answered to the patient's satisfaction.     [name]

## 2017-10-21 NOTE — Anesthesia Postprocedure Evaluation (Signed)
Anesthesia Post Note  Patient: Michael Brady  Procedure(s) Performed: CYSTOSCOPY, RETROGRADE [name] LEFT UPPER TRACT,BIOPSY, [name] LASER/STENT PLACEMENT (Left Renal) HOLMIUM LASER APPLICATION (Left Renal)     Patient location during evaluation: PACU Anesthesia Type: General Level of consciousness: awake and alert Pain management: pain level controlled Vital Signs Assessment: post-procedure vital signs reviewed and stable Respiratory status: spontaneous breathing, nonlabored ventilation, respiratory function stable and patient connected to nasal cannula oxygen Cardiovascular status: blood pressure returned to baseline and stable Postop Assessment: no apparent nausea or vomiting Anesthetic complications: no    Last Vitals:  Vitals:   10/21/17 1000 10/21/17 1022  BP: (!) 162/97   Pulse: 62 (!) 59  Resp: 18 20  Temp:    SpO2: 100% 98%    Last Pain:  Vitals:   10/21/17 1022  TempSrc:   PainSc: 0-No pain                 Lorelle Macaluso S

## 2017-10-21 NOTE — Transfer of Care (Signed)
Immediate Anesthesia Transfer of Care Note  Patient: Michael Brady  Procedure(s) Performed: CYSTOSCOPY, RETROGRADE [name] LEFT UPPER TRACT,BIOPSY, [name] LASER/STENT PLACEMENT (Left Renal) HOLMIUM LASER APPLICATION (Left Renal)  Patient Location: PACU  Anesthesia Type:General  Level of Consciousness: sedated and patient cooperative  Airway & Oxygen Therapy: Patient Spontanous Breathing and Patient connected to nasal cannula oxygen  Post-op Assessment: Report given to RN and Post -op Vital signs reviewed and stable  Post vital signs: Reviewed and stable  Last Vitals:  Vitals Value Taken Time  BP    Temp    Pulse 71 10/21/2017  9:58 AM  Resp 13 10/21/2017  9:58 AM  SpO2 100 % 10/21/2017  9:58 AM  Vitals shown include unvalidated device data.  Last Pain:  Vitals:   10/21/17 0822  TempSrc:   PainSc: 0-No pain         Complications: No apparent anesthesia complications

## 2017-10-21 NOTE — Anesthesia Procedure Notes (Signed)
Procedure Name: LMA Insertion Date/Time: 10/21/2017 8:56 AM Performed by: Wanita Chamberlain, CRNA Pre-anesthesia Checklist: Patient identified, Emergency Drugs available, Suction available, Patient being monitored and Timeout performed Patient Re-evaluated:Patient Re-evaluated prior to induction Oxygen Delivery Method: Circle system utilized Preoxygenation: Pre-oxygenation with 100% oxygen Induction Type: IV induction Ventilation: Mask ventilation without difficulty LMA: LMA inserted LMA Size: 4.0 Number of attempts: 1 Airway Equipment and Method: Bite block (gauze) Placement Confirmation: breath sounds checked- equal and bilateral,  CO2 detector and positive ETCO2 Tube secured with: Tape Dental Injury: Teeth and Oropharynx as per pre-operative assessment

## 2017-10-21 NOTE — Op Note (Signed)
Date of procedure: 10/21/17  Preoperative diagnosis:  1. 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 Same  Procedure: 1. Cystoscopy 2. Left ureteroscopy 3. Laser ablation of left lower pole upper tract tumor 4. Biopsy of left lower pole upper tract tumor 5. Left retrograde pyelogram with interpretation 6. Left ureteral stent placement 6 French by 26 cm the patient recurrence of his  Surgeon: Baruch Gouty, MD  Anesthesia: General  Complications: None  Intraoperative findings: The patient had recurrence of his left lower tract tumor in the lower pole.  It took up the majority of the lower pole.  This was biopsied with both flexible biopsy graspers and a stone basket.  Pan cystoscopy revealed no tumors within the urinary bladder.  EBL: None  Specimens: Left upper tract tumor to pathology  Drains: 6 French by 26 cm left double-J ureteral stent  Disposition: Stable to the postanesthesia care unit  Indication for procedure: The patient is a 62 y.o. male with history of a left renal pelvis low-grade upper tract TCC presents today for surveillance 3 months after initial laser ablation.  After reviewing the management options for treatment, the patient elected to proceed with the above surgical procedure(s). We have discussed the potential benefits and risks of the procedure, side effects of the proposed treatment, the likelihood of the patient achieving the goals of the procedure, and any potential problems that might occur during the procedure or recuperation. Informed consent has been obtained.  Description of procedure: The patient was met in the preoperative area. All risks, benefits, and indications of the procedure were described in great detail. The patient consented to the procedure. Preoperative antibiotics were given. The patient was taken to the operative theater. General anesthesia was induced per the anesthesia service. The patient was  then placed in the dorsal lithotomy position and prepped and draped in the usual sterile fashion. A preoperative timeout was called.   21 French 30 degree cystoscope was inserted into the patient's bladder per urethra atraumatically.  Pan cystoscopy revealed no tumors within the urinary bladder.  With the aid of a dual lumen open-ended catheter to sensor wires were advanced the level of the renal pelvis under fluoroscopy.  Cystoscope was withdrawn.  Over 1 of the wires, the ureteral access sheath was advanced to the level of the renal pelvis under fluoroscopy without difficulty.  The inner sheath and sensor wire removed.  The flexible ureteroscope was then inserted into the patient's bladder and upper tract via the access sheath.  The patient's upper and middle pole as well as renal pelvis were free of tumor.  However, he did have the bulk of his left lower pole with recurrent disease.  This was biopsied with biopsy forceps as well as the stone basket until sufficient pieces were obtained for pathology.  Next attention was turned to bleeding the tumor within his left lower pole.  This took place for over 1/2-hour.  At this point, the necrotic tumor from the ablation as well as bleeding from the ablation limited visualization.  There was apparently some more tumor however this cannot be easily reached were seen.  At this point the decision was made to make this a stage procedure.  Left retrograde pyelogram was obtained which did show some residual filling defect in the left lower pole.  At this point the ureteral access sheath removed under direct fertilization revealing no tumor within the left ureter.  At this point the cystoscope was reassembled over the  remaining sensor wire and a 6 [place] by 26 cm double-J ureteral stent was placed.  The sensor wire was removed.  A curl seen in the patient's left renal pelvis under fluoroscopy and in the urinary bladder under direct visualization.  Patient's bladder was then  drained.  He was awoken from anesthesia and transferred in stable condition to the post anesthesia care unit.  Plan: Patient will follow-up to discuss his pathology results.  Assuming this is still a low-grade tumor, we will plan for a second look to complete stage ablation of his left lower pole tumor.  Baruch Gouty, M.D.

## 2017-10-21 NOTE — H&P (Signed)
CC: I have blood in my urine.  HPI: Michael Brady is a 62 year-old male established patient who is here for blood in the urine.  He did see the blood in his urine. He has not seen blood clots.   He does not have a burning sensation when he urinates. He is not currently having trouble urinating.   He has not had kidney stones. He is not having pain. He has not recently had unwanted weight loss.   Patient presents today for evaluation of gross hematuria. He was seen in the emergency department recently for this. No imaging was performed. This is the first time he has ever noticed blood in his urine. It has been going on approximately 2-3 days. No clots. He denies the feeling of incomplete bladder emptying. No previous history of kidney stones. Patient is a smoker.   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 He underwent a staged ablation of this due to inability to reach the upper tract in January 2019. Fortunately, his pathology was relatively favorable and showed a low-grade papillary urothelial carcinoma.     ALLERGIES: Penicillin - Swelling    MEDICATIONS: Aleve  Hydrocodone-Acetaminophen  Multivitamin     GU PSH: Cysto Uretero Biopsy Fulgura, Left - 07/29/2017 Cystoscopy - 06/24/2017 Cystoscopy Insert Stent, Left - 07/29/2017, Left - 07/15/2017 Locm 300-399Mg /Ml Iodine,1Ml - 06/14/2017    NON-GU PSH: Dental Surgery Procedure Hand/finger Surgery, Left Shoulder Surgery (Unspecified)    GU PMH: Gross hematuria - 06/24/2017, - 06/03/2017    NON-GU PMH: None   FAMILY HISTORY: None   SOCIAL HISTORY: Marital Status: Married Preferred Language: English Current Smoking Status: Patient does not smoke anymore.   Tobacco Use Assessment Completed: Used Tobacco in last 30 days? Has never drank.  Drinks 1 caffeinated drink per day. Patient's occupation Training and development officer.    REVIEW OF SYSTEMS:    GU Review Male:   Patient  denies frequent urination, hard to postpone urination, burning/ pain with urination, get up at night to urinate, leakage of urine, stream starts and stops, trouble starting your stream, have to strain to urinate , erection problems, and penile pain.  Gastrointestinal (Upper):   Patient denies nausea, vomiting, and indigestion/ heartburn.  Gastrointestinal (Lower):   Patient denies diarrhea and constipation.  Constitutional:   Patient denies fever, night sweats, weight loss, and fatigue.  Skin:   Patient denies skin rash/ lesion and itching.  Eyes:   Patient denies blurred vision and double vision.  Ears/ Nose/ Throat:   Patient denies sore throat and sinus problems.  Hematologic/Lymphatic:   Patient denies swollen glands and easy bruising.  Cardiovascular:   Patient denies leg swelling and chest pains.  Respiratory:   Patient denies shortness of breath and cough.  Endocrine:   Patient denies excessive thirst.  Musculoskeletal:   Patient denies back pain and joint pain.  Neurological:   Patient denies headaches and dizziness.  Psychologic:   Patient denies depression and anxiety.   VITAL SIGNS: None   MULTI-SYSTEM PHYSICAL EXAMINATION:    Constitutional: Well-nourished. No physical deformities. Normally developed. Good grooming.  Neck: Neck symmetrical, not swollen. Normal tracheal position.  Respiratory: No labored breathing, no use of accessory muscles.   Cardiovascular: Normal temperature, normal extremity pulses, no swelling, no varicosities.  Skin: No paleness, no jaundice, no cyanosis. No lesion, no ulcer, no rash.  Gastrointestinal: No mass, no tenderness, no rigidity, non obese abdomen.  Eyes: Normal conjunctivae. Normal eyelids.  Ears, Nose, Mouth, and Throat: Left ear no scars, no lesions, no masses. Right ear no scars, no lesions, no masses. Nose no scars, no lesions, no masses. Normal hearing. Normal lips.  Musculoskeletal: Normal gait and station of head and neck.     PAST  DATA REVIEWED:  Source Of History:  Patient  Records Review:   Pathology Reports   PROCEDURES:         Flexible Cystoscopy Left Stent Removal - 52310  Risks, benefits, and some of the potential complications of the procedure were discussed at length with the patient including infection, bleeding, voiding discomfort, urinary retention, fever, chills, sepsis, and others. All questions were answered. Informed consent was obtained. Antibiotic prophylaxis was given. Sterile technique and intraurethral analgesia were used.  Meatus:  Normal size. Normal location. Normal condition.  Urethra:  No strictures.  External Sphincter:  Normal.  Verumontanum:  Normal.  Prostate:  Non-obstructing. No hyperplasia.  Bladder Neck:  Non-obstructing.  Ureteral Orifices:  Normal location. Normal size. Normal shape. Effluxed clear urine.  Bladder:  No trabeculation. No tumors. Normal mucosa. No stones.  The left ureteral stent was carefully removed with a grasping instrument.    The lower urinary tract was carefully examined. The procedure was well-tolerated and without complications. Antibiotic instructions were given. Instructions were given to call the office immediately for bloody urine, difficulty urinating, urinary retention, painful or frequent urination, fever, chills, nausea, vomiting or other illness. The patient stated that he understood these instructions and would comply with them.   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 Left upper tract low grade TCC  Plan for 3 month surveillance of left upper tract via cysto, L URS, possible laser ablation, possible L ureteral stent.

## 2017-10-22 ENCOUNTER — Encounter (HOSPITAL_BASED_OUTPATIENT_CLINIC_OR_DEPARTMENT_OTHER): Payer: Self-pay | Admitting: Urology

## 2017-11-11 ENCOUNTER — Other Ambulatory Visit: Payer: Self-pay | Admitting: Urology

## 2017-11-20 ENCOUNTER — Encounter: Payer: Self-pay | Admitting: Family Medicine

## 2017-11-20 ENCOUNTER — Ambulatory Visit: Payer: 59 | Admitting: Family Medicine

## 2017-11-20 VITALS — BP 140/80 | Ht 66.5 in | Wt 164.1 lb

## 2017-11-20 DIAGNOSIS — F321 Major depressive disorder, single episode, moderate: Secondary | ICD-10-CM | POA: Diagnosis not present

## 2017-11-20 DIAGNOSIS — R300 Dysuria: Secondary | ICD-10-CM | POA: Diagnosis not present

## 2017-11-20 DIAGNOSIS — J329 Chronic sinusitis, unspecified: Secondary | ICD-10-CM | POA: Diagnosis not present

## 2017-11-20 LAB — POCT URINALYSIS DIPSTICK
Spec Grav, UA: 1.015 (ref 1.010–1.025)
pH, UA: 5 (ref 5.0–8.0)

## 2017-11-20 MED ORDER — CIPROFLOXACIN HCL 500 MG PO TABS: 500.0000 mg | ORAL_TABLET | Freq: Two times a day (BID) | ORAL | 0 refills | Status: AC

## 2017-11-20 MED ORDER — CITALOPRAM HYDROBROMIDE 20 MG PO TABS: 20.0000 mg | ORAL_TABLET | Freq: Every day | ORAL | 5 refills | Status: DC

## 2017-11-20 NOTE — Progress Notes (Signed)
   Subjective:    Patient ID: Michael Brady, male    DOB: 1956/02/03, 62 y.o.   MRN: [id number] Patient presents with complicated concerns.  No suicidal thoughts HPI  Patient is here today to follow up on chronic health issues, and to discuss left nephrectomy scheduled for the 5th of June, due to a tumor on is the kidney. He is having burning and stinging with urinating.He stopped up and head congestion also.    Mentioned to Dr. Tammi Klippel sob and chest pain and set up with a cardiology.They have not heard back from Card,but will not do the surgery until after cleared from cardiology.  Pt not really sure he has true hear t problems, but he did mention sob and som tightness with the congestion, the uro then insisted on the card referral    Also wife feels husband needs to be put on citalopram. I gave Gad 7 to the pt.feeling down and stressed at times, also feeling anxious with it  Pos dysuria, pt took azo for oe day, and  Having some anxiety also     Review of Systems     Results for orders placed or performed in visit on 11/20/17  POCT urinalysis dipstick  Result Value Ref Range   Color, UA     Clarity, UA     Glucose, UA     Bilirubin, UA     Ketones, UA     Spec Grav, UA 1.015 1.010 - 1.025   Blood, UA 2+    pH, UA 5.0 5.0 - 8.0   Protein, UA     Urobilinogen, UA  0.2 or 1.0 E.U./dL   Nitrite, UA     Leukocytes, UA Moderate (2+) (A) Negative   Appearance     Odor      Objective:   Physical Exam Alert and oriented, vitals reviewed and stable, NAD ENT-TM's and ext canals WNL bilat via otoscopic exam Soft palate, tonsils and post pharynx WNL via oropharyngeal exam Neck-symmetric, no masses; thyroid nonpalpable and nontender Pulmonary-no tachypnea or accessory muscle use; Clear without wheezes via auscultation Card--no abnrml murmurs, rhythm reg and rate WNL Carotid pulses symmetric, without bruits No CVA tenderness       Assessment & Plan:  1 impression potential  urinary tract infection.  Antibiotics prescribed symptom care discussed  2.  Rhinosinusitis.  Antibiotics prescribed for this also.  Hopefully Cipro will cover both  3.  Recent diagnosis of renal cancer/in the midst of surgery  4.  Depression with element of anxiety.  Discussed.  Progressive over recent months.  Patient would like to try medications as initiated  Follow-up as scheduled exercise encouraged take all medicines as prescribed

## 2017-11-21 DIAGNOSIS — F321 Major depressive disorder, single episode, moderate: Secondary | ICD-10-CM | POA: Insufficient documentation

## 2017-11-22 LAB — URINE CULTURE

## 2017-11-22 LAB — SPECIMEN STATUS REPORT

## 2017-11-25 ENCOUNTER — Encounter: Payer: Self-pay | Admitting: Cardiovascular Disease

## 2017-11-27 ENCOUNTER — Encounter

## 2017-11-27 ENCOUNTER — Ambulatory Visit (INDEPENDENT_AMBULATORY_CARE_PROVIDER_SITE_OTHER): Payer: 59 | Admitting: Cardiovascular Disease

## 2017-11-27 ENCOUNTER — Encounter: Payer: Self-pay | Admitting: Cardiovascular Disease

## 2017-11-27 VITALS — BP 130/70 | HR 72 | Ht 69.0 in | Wt 164.0 lb

## 2017-11-27 DIAGNOSIS — R0789 Other chest pain: Secondary | ICD-10-CM | POA: Diagnosis not present

## 2017-11-27 DIAGNOSIS — R0989 Other specified symptoms and signs involving the circulatory and respiratory systems: Secondary | ICD-10-CM

## 2017-11-27 NOTE — Progress Notes (Signed)
Cardiology Office Note:    Date:  11/27/2017   ID:  Michael Brady, DOB 07/19/1955, MRN [id number]  PCP:  Mikey Kirschner, MD  Cardiologist:  No primary care provider on file.   Referring MD: Mikey Kirschner, MD   Chief Complaint  Patient presents with  . Pre-op Exam    History of Present Illness:    Michael Brady is a 62 y.o. male with a hx of renal tumor.  He is scheduled to have a left nephrectomy.  We are asked to see him today by Dr. Sharlee Blew for pre op evaluation prior to left nephrectomy .   Pt does not have a hx of cardiac disease  Had robotic surgery several weeks ago.  Had a ureteral  stent placed.  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   ( was very dusty)  He was exposed to lots of dust on the same day that he started Azo.   He called the urologist and he reported the chest congestion / chest pain .    Was like an allergic reaction   Still smoking ,      Past Medical History:  Diagnosis Date  . Diverticulosis of colon    NO ISSUES IN OVER 12 YEARS   . Dysuria   . Hematuria    10-11-17: REPORTS I "NOTICED SOME BLOOD IN MY URINE TODAY AFTER NOT SEEING ANY SINCE MY SURGERY IN [place] . I JUST HAD MY BLOOD DRAWN AT [facility] THIS LAST MONDAY BUT THEY HAVENT CALLED ME ABOUT IT YET"  . History of closed dislocation of shoulder 06/2014   LEFT --- POST IV SEDATION CLOSED REDUCTION IN ED  . PONV (postoperative nausea and vomiting)   . Renal mass, left    LEFT RENAL COLLECTING SYSTEM MASS  . Smokers' cough ([place])   . Wears dentures   . Wears glasses     Past Surgical History:  Procedure Laterality Date  . CARPAL TUNNEL RELEASE Left 01-25-2006   dr Lorin [facility]  [facility]   and LEFT THUMB PULLEY RELEASE  . COLONOSCOPY N/A 05/07/2017   Procedure: COLONOSCOPY;  Surgeon: Aviva Signs, MD;  [facility];  Service: Gastroenterology;  Laterality: N/A;  . CYSTOSCOPY WITH URETEROSCOPY AND STENT PLACEMENT Left 07/29/2017   Procedure:  CYSTOSCOPY WITH URETEROSCOPY AND STENT PLACEMENT;  Surgeon: [name], MD;  Location: [facility];  Service: Urology;  Laterality: Left;  . CYSTOSCOPY/URETEROSCOPY/HOLMIUM LASER/STENT PLACEMENT Left 07/15/2017   Procedure: CYSTOSCOPY, ATTEMTED URETEROSCOPY/,STENT PLACEMENT;  Surgeon: [name], MD;  Location[facility]  Service: Urology;  Laterality: Left;  . CYSTOSCOPY/URETEROSCOPY/HOLMIUM LASER/STENT PLACEMENT Left 10/21/2017   Procedure: CYSTOSCOPY, RETROGRADE /URETEROSCOPY LEFT UPPER TRACT,BIOPSY, [name] LASER/STENT PLACEMENT;  Surgeon: [name], MD;  Location: [facility];  Service: Urology;  Laterality: Left;  . HOLMIUM LASER APPLICATION Left 1/94/1740   Procedure: HOLMIUM LASER APPLICATION;  Surgeon: [name], MD;  Location: [facility];  Service: Urology;  Laterality: Left;  . SHOULDER SURGERY Right 1997   ROTATOR CUFF REPAIR   . THULIUM LASER TURP (TRANSURETHRAL RESECTION OF PROSTATE) Left 07/29/2017   Procedure: CYSTOSCOPY, LEFT URETEROSCOPY WITH TUMOR BIOPSYTHULLIUM LASER ABLATION OF TUMOR AND LEFT URETERAL STENT EXCHANGE;  Surgeon: [name], MD;  Location: [facility];  Service: Urology;  Laterality: Left;    Current Medications: Current Meds  Medication Sig  . acetaminophen (TYLENOL 8 HOUR  ARTHRITIS PAIN) 650 MG CR tablet Take 650-1,300 mg by mouth every 8 (eight) hours as needed for pain.  . ciprofloxacin (CIPRO) 500 MG tablet Take 1 tablet (500 mg total) by mouth 2 (two) times daily for 10 days.  . citalopram (CELEXA) 20 MG tablet Take 1 tablet (20 mg total) by mouth daily. (Patient taking differently: Take 20 mg by mouth at bedtime. )  . HYDROcodone-acetaminophen (NORCO/VICODIN) 5-325 MG tablet Take 1 tablet by mouth every 4 (four) hours as needed for moderate pain.  . naproxen sodium (ANAPROX) 220 MG tablet Take 440 mg by mouth 2 (two) times daily as  needed (for pain or headache).      Allergies:   Penicillins   Social History   Socioeconomic History  . Marital status: Married    Spouse name: Not on file  . Number of children: Not on file  . Years of education: Not on file  . Highest education level: Not on file  Occupational History  . Not on file  Social Needs  . Financial resource strain: Not on file  . Food insecurity:    Worry: Not on file    Inability: Not on file  . Transportation needs:    Medical: Not on file    Non-medical: Not on file  Tobacco Use  . Smoking status: Current Every Day Smoker    Packs/day: 1.50    Years: 40.00    Pack years: 60.00    Types: Cigarettes  . Smokeless tobacco: Never Used  Substance and Sexual Activity  . Alcohol use: No  . Drug use: No  . Sexual activity: Not on file  Lifestyle  . Physical activity:    Days per week: Not on file    Minutes per session: Not on file  . Stress: Not on file  Relationships  . Social connections:    Talks on phone: Not on file    Gets together: Not on file    Attends religious service: Not on file    Active member of club or organization: Not on file    Attends meetings of clubs or organizations: Not on file    Relationship status: Not on file  Other Topics Concern  . Not on file  Social History Narrative  . Not on file     Family History: The patient's family history is negative for Colon cancer.  ROS:   Please see the history of present illness.     All other systems reviewed and are negative.  EKGs/Labs/Other Studies Reviewed:    The following studies were reviewed today:   EKG:   Nov 27, 2017: Normal sinus rhythm at 72.  No ST or T wave changes.  Normal EKG.  Recent Labs: 06/01/2017: ALT 21; BUN 20; Creatinine, Ser 0.83; Platelets 233; Potassium 4.3; Sodium 137 07/29/2017: Hemoglobin 14.8  Recent Lipid Panel    Component Value Date/Time   CHOL 232 (H) 04/02/2017 0833   TRIG 190 (H) 04/02/2017 0833   HDL 44 04/02/2017  0833   CHOLHDL 5.3 (H) 04/02/2017 0833   LDLCALC 150 (H) 04/02/2017 0833    Physical Exam:    VS:  BP 130/70   Pulse 72   Ht 5\' 9"  (1.753 m)   Wt 164 lb (74.4 kg)   SpO2 97%   BMI 24.22 kg/m     Wt Readings from Last 3 Encounters:  11/27/17 164 lb (74.4 kg)  11/20/17 164 lb 0.8 oz (74.4 kg)  10/21/17 167 lb 3.2  oz (75.8 kg)     GEN:  Well nourished, well developed in no acute distress HEENT: Normal NECK: No JVD; No carotid bruits LYMPHATICS: No lymphadenopathy CARDIAC: RRR, no murmurs, rubs, gallops RESPIRATORY:  Clear to auscultation without rales, wheezing or rhonchi  ABDOMEN: Soft, non-tender, non-distended MUSCULOSKELETAL:  No edema; No deformity  SKIN: Warm and dry NEUROLOGIC:  Alert and oriented x 3 PSYCHIATRIC:  Normal affect   ASSESSMENT:    1. Chest congestion   2. Atypical chest pain    PLAN:    In order of problems listed above:  1. Atypical chest pain: The patient presents with atypical chest pain.  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  He had lots of cough and congestion resulting in atypical pleuritic-like chest pain.  He does not have any history of cardiac problems.  He's had 3 surgeries already this year and did not have any complications with surgery.    He is at low risk of having cardiovascular complications with his upcoming urology surgery.  He can see me on an as-needed basis.  Medication Adjustments/Labs and Tests Ordered: Current medicines are reviewed at length with the patient today.  Concerns regarding medicines are outlined above.  Orders Placed This Encounter  Procedures  . EKG 12-Lead   No orders of the defined types were placed in this encounter.   Patient Instructions  Medication Instructions:  Your physician recommends that you continue on your current medications as directed. Please refer to the Current Medication list given to you today.    Labwork: None  ordered    Testing/Procedures: None ordered     Follow-Up Your physician recommends that you schedule a follow-up appointment in: as needed with Dr. Acie Fredrickson     Any Other Special Instructions Will Be Listed Below (If Applicable).     If you need a refill on your cardiac medications before your next appointment, please call your pharmacy.     Signed, Mertie Moores, MD  11/27/2017 3:04 PM    [place]

## 2017-11-27 NOTE — Patient Instructions (Signed)
Medication Instructions:  Your physician recommends that you continue on your current medications as directed. Please refer to the Current Medication list given to you today.    Labwork: None ordered    Testing/Procedures: None ordered     Follow-Up Your physician recommends that you schedule a follow-up appointment in: as needed with Dr. Acie Fredrickson     Any Other Special Instructions Will Be Listed Below (If Applicable).     If you need a refill on your cardiac medications before your next appointment, please call your pharmacy.

## 2017-12-04 NOTE — Patient Instructions (Addendum)
Michael Brady  12/04/2017   Your procedure is scheduled on: Wednesday 12/11/2017  Report to [facility] Main  Entrance              Report to admitting at   1000  AM    Call this number if you have problems the morning of surgery [phone]    Remember: Do not eat food :After Midnight  On Monday 12/09/2017 as you will be on clear liquids on Tuesday 12/10/2017.   Follow bowel prep instructions from Dr. Zettie Pho office the day before surgery on  Tuesday 12/10/2017 along with a clear liquid diet!              The day before surgery on Tuesday 12/10/2017 , please drink Bottle of Magnesium Citrate by noon and drink a clear liquid diet all day up until midnight!    CLEAR LIQUID DIET   Foods Allowed                                                                     Foods Excluded  Coffee and tea, regular and decaf                             liquids that you cannot  Plain Jell-O in any flavor                                             see through such as: Fruit ices (not with fruit pulp)                                     milk, soups, orange juice  Iced Popsicles                                    All solid food Carbonated beverages, regular and diet                                    Cranberry, grape and apple juices Sports drinks like Gatorade Lightly seasoned clear broth or consume(fat free) Sugar, honey syrup  Sample Menu Breakfast                                Lunch                                     Supper Cranberry juice                    Beef broth  Chicken broth Jell-O                                     Grape juice                           Apple juice Coffee or tea                        Jell-O                                      Popsicle                                                Coffee or tea                        Coffee or  tea  _____________________________________________________________________     Take these medicines the morning of surgery with A SIP OF WATER: none                                You may not have any metal on your body including hair pins and              piercings  Do not wear jewelry, make-up, lotions, powders or perfumes, deodorant                         Men may shave face and neck.   Do not bring valuables to the hospital. [place].  Contacts, dentures or bridgework may not be worn into surgery.  Leave suitcase in the car. After surgery it may be brought to your room.                  Please read over the following fact sheets you were given: _____________________________________________________________________             [facility] - Preparing for Surgery Before surgery, you can play an important role.  Because skin is not sterile, your skin needs to be as free of germs as possible.  You can reduce the number of germs on your skin by washing with CHG (chlorahexidine gluconate) soap before surgery.  CHG is an antiseptic cleaner which kills germs and bonds with the skin to continue killing germs even after washing. Please DO NOT use if you have an allergy to CHG or antibacterial soaps.  If your skin becomes reddened/irritated stop using the CHG and inform your nurse when you arrive at Short Stay. Do not shave (including legs and underarms) for at least 48 hours prior to the first CHG shower.  You may shave your face/neck. Please follow these instructions carefully:  1.  Shower with CHG Soap the night before surgery and the  morning of Surgery.  2.  If you choose to wash your hair, wash your hair first as usual with your  normal  shampoo.  3.  After you shampoo, rinse your hair and body thoroughly to remove the  shampoo.                           4.  Use CHG as you would any other liquid soap.  You can apply chg directly  to  the skin and wash                       Gently with a scrungie or clean washcloth.  5.  Apply the CHG Soap to your body ONLY FROM THE NECK DOWN.   Do not use on face/ open                           Wound or open sores. Avoid contact with eyes, ears mouth and genitals (private parts).                       Wash face,  Genitals (private parts) with your normal soap.             6.  Wash thoroughly, paying special attention to the area where your surgery  will be performed.  7.  Thoroughly rinse your body with warm water from the neck down.  8.  DO NOT shower/wash with your normal soap after using and rinsing off  the CHG Soap.                9.  Pat yourself dry with a clean towel.            10.  Wear clean pajamas.            11.  Place clean sheets on your bed the night of your first shower and do not  sleep with pets. Day of Surgery : Do not apply any lotions/deodorants the morning of surgery.  Please wear clean clothes to the hospital/surgery center.  FAILURE TO FOLLOW THESE INSTRUCTIONS MAY RESULT IN THE CANCELLATION OF YOUR SURGERY PATIENT SIGNATURE_________________________________  NURSE SIGNATURE__________________________________  ________________________________________________________________________  WHAT IS A BLOOD TRANSFUSION? Blood Transfusion Information  A transfusion is the replacement of blood or some of its parts. Blood is made up of multiple cells which provide different functions.  Red blood cells carry oxygen and are used for blood loss replacement.  White blood cells fight against infection.  Platelets control bleeding.  Plasma helps clot blood.  Other blood products are available for specialized needs, such as hemophilia or other clotting disorders. BEFORE THE TRANSFUSION  Who gives blood for transfusions?   Healthy volunteers who are fully evaluated to make sure their blood is safe. This is blood bank blood. Transfusion therapy is the safest it has ever  been in the practice of medicine. Before blood is taken from a donor, a complete history is taken to make sure that person has no history of diseases nor engages in risky social behavior (examples are intravenous drug use or sexual activity with multiple partners). The donor's travel history is screened to minimize risk of transmitting infections, such as malaria. The donated blood is tested for signs of infectious diseases, such as HIV and hepatitis. The blood is then tested to be sure it is compatible with you in order to minimize the chance of a transfusion reaction. If you or a relative donates blood, this is often done in anticipation of surgery and is not appropriate  for emergency situations. It takes many days to process the donated blood. RISKS AND COMPLICATIONS Although transfusion therapy is very safe and saves many lives, the main dangers of transfusion include:   Getting an infectious disease.  Developing a transfusion reaction. This is an allergic reaction to something in the blood you were given. Every precaution is taken to prevent this. The decision to have a blood transfusion has been considered carefully by your caregiver before blood is given. Blood is not given unless the benefits outweigh the risks. AFTER THE TRANSFUSION  Right after receiving a blood transfusion, you will usually feel much better and more energetic. This is especially true if your red blood cells have gotten low (anemic). The transfusion raises the level of the red blood cells which carry oxygen, and this usually causes an energy increase.  The nurse administering the transfusion will monitor you carefully for complications. HOME CARE INSTRUCTIONS  No special instructions are needed after a transfusion. You may find your energy is better. Speak with your caregiver about any limitations on activity for underlying diseases you may have. SEEK MEDICAL CARE IF:   Your condition is not improving after your  transfusion.  You develop redness or irritation at the intravenous (IV) site. SEEK IMMEDIATE MEDICAL CARE IF:  Any of the following symptoms occur over the next 12 hours:  Shaking chills.  You have a temperature by mouth above 102 F (38.9 C), not controlled by medicine.  Chest, back, or muscle pain.  People around you feel you are not acting correctly or are confused.  Shortness of breath or difficulty breathing.  Dizziness and fainting.  You get a rash or develop hives.  You have a decrease in urine output.  Your urine turns a dark color or changes to pink, red, or brown. Any of the following symptoms occur over the next 10 days:  You have a temperature by mouth above 102 F (38.9 C), not controlled by medicine.  Shortness of breath.  Weakness after normal activity.  The white part of the eye turns yellow (jaundice).  You have a decrease in the amount of urine or are urinating less often.  Your urine turns a dark color or changes to pink, red, or brown. Document Released: 06/22/2000 Document Revised: 09/17/2011 Document Reviewed: 02/09/2008 [facility] Patient Information 2014 [place], [place].  _______________________________________________________________________

## 2017-12-06 ENCOUNTER — Encounter (HOSPITAL_COMMUNITY): Payer: Self-pay

## 2017-12-06 ENCOUNTER — Encounter (HOSPITAL_COMMUNITY)
Admission: RE | Admit: 2017-12-06 | Discharge: 2017-12-06 | Disposition: A | Discharge status: Home / Self Care | Payer: 59 | Source: Ambulatory Visit | Attending: Urology | Admitting: Urology

## 2017-12-06 ENCOUNTER — Other Ambulatory Visit: Payer: Self-pay

## 2017-12-06 DIAGNOSIS — C652 Malignant neoplasm of left renal pelvis: Secondary | ICD-10-CM | POA: Diagnosis not present

## 2017-12-06 DIAGNOSIS — Z01812 Encounter for preprocedural laboratory examination: Secondary | ICD-10-CM | POA: Insufficient documentation

## 2017-12-06 HISTORY — DX: Malignant (primary) neoplasm, unspecified: C80.1

## 2017-12-06 LAB — CBC
HCT: 42 % (ref 39.0–52.0)
Hemoglobin: 13.9 g/dL (ref 13.0–17.0)
MCH: 29.3 pg (ref 26.0–34.0)
MCHC: 33.1 g/dL (ref 30.0–36.0)
MCV: 88.4 fL (ref 78.0–100.0)
PLATELETS: 411 10*3/uL — AB (ref 150–400)
RBC: 4.75 MIL/uL (ref 4.22–5.81)
RDW: 14.7 % (ref 11.5–15.5)
WBC: 8.7 10*3/uL (ref 4.0–10.5)

## 2017-12-06 LAB — BASIC METABOLIC PANEL
Anion gap: 8 (ref 5–15)
BUN: 14 mg/dL (ref 6–20)
CALCIUM: 9.7 mg/dL (ref 8.9–10.3)
CO2: 28 mmol/L (ref 22–32)
CREATININE: 0.9 mg/dL (ref 0.61–1.24)
Chloride: 102 mmol/L (ref 101–111)
GFR calc Af Amer: >60 mL/min (ref >60)
GFR calc non Af Amer: >60 mL/min (ref >60)
Glucose, Bld: 109 mg/dL — ABNORMAL HIGH (ref 65–99)
Potassium: 5.2 mmol/L — ABNORMAL HIGH (ref 3.5–5.1)
SODIUM: 138 mmol/L (ref 135–145)

## 2017-12-06 LAB — ABO/RH: ABO/RH(D): B POS

## 2017-12-10 ENCOUNTER — Ambulatory Visit: Payer: 59 | Admitting: Cardiology

## 2017-12-11 ENCOUNTER — Inpatient Hospital Stay (HOSPITAL_COMMUNITY)
Admission: RE | Admit: 2017-12-11 | Discharge: 2017-12-13 | DRG: 657 | Disposition: A | Discharge status: Home / Self Care | Payer: No Typology Code available for payment source | Source: Ambulatory Visit | Attending: Urology | Admitting: Urology

## 2017-12-11 ENCOUNTER — Encounter (HOSPITAL_COMMUNITY): Payer: Self-pay | Admitting: *Deleted

## 2017-12-11 ENCOUNTER — Inpatient Hospital Stay (HOSPITAL_COMMUNITY): Payer: No Typology Code available for payment source | Admitting: Anesthesiology

## 2017-12-11 ENCOUNTER — Encounter (HOSPITAL_COMMUNITY)
Admission: RE | Disposition: A | Discharge status: Home / Self Care | Payer: Self-pay | Source: Ambulatory Visit | Attending: Urology

## 2017-12-11 ENCOUNTER — Other Ambulatory Visit: Payer: Self-pay

## 2017-12-11 DIAGNOSIS — Z79899 Other long term (current) drug therapy: Secondary | ICD-10-CM

## 2017-12-11 DIAGNOSIS — F17210 Nicotine dependence, cigarettes, uncomplicated: Secondary | ICD-10-CM | POA: Diagnosis present

## 2017-12-11 DIAGNOSIS — C652 Malignant neoplasm of left renal pelvis: Secondary | ICD-10-CM | POA: Diagnosis present

## 2017-12-11 DIAGNOSIS — F329 Major depressive disorder, single episode, unspecified: Secondary | ICD-10-CM | POA: Diagnosis present

## 2017-12-11 DIAGNOSIS — N029 Recurrent and persistent hematuria with unspecified morphologic changes: Secondary | ICD-10-CM | POA: Diagnosis present

## 2017-12-11 DIAGNOSIS — Z880 Allergy status to penicillin: Secondary | ICD-10-CM | POA: Diagnosis not present

## 2017-12-11 DIAGNOSIS — N2889 Other specified disorders of kidney and ureter: Secondary | ICD-10-CM | POA: Diagnosis present

## 2017-12-11 HISTORY — PX: ROBOT ASSITED LAPAROSCOPIC NEPHROURETERECTOMY: SHX6077

## 2017-12-11 LAB — HEMOGLOBIN AND HEMATOCRIT, BLOOD
HCT: 40.7 % (ref 39.0–52.0)
Hemoglobin: 13.4 g/dL (ref 13.0–17.0)

## 2017-12-11 LAB — TYPE AND SCREEN
ABO/RH(D): B POS
Antibody Screen: NEGATIVE

## 2017-12-11 SURGERY — NEPHROURETERECTOMY, ROBOT-ASSISTED, LAPAROSCOPIC
Anesthesia: General | Laterality: Left

## 2017-12-11 MED ORDER — ONDANSETRON HCL 4 MG/2ML IJ SOLN: 4.0000 mg | INTRAMUSCULAR | Status: DC | PRN

## 2017-12-11 MED ORDER — BOOST / RESOURCE BREEZE PO LIQD CUSTOM
1.0000 | Freq: Three times a day (TID) | ORAL | Status: DC
  Administered 2017-12-11: 1 via ORAL

## 2017-12-11 MED ORDER — BUPIVACAINE LIPOSOME 1.3 % IJ SUSP
20.0000 mL | Freq: Once | INTRAMUSCULAR | Status: AC
  Administered 2017-12-11: 20 mL
  Filled 2017-12-11: qty 20

## 2017-12-11 MED ORDER — LIDOCAINE 2% (20 MG/ML) 5 ML SYRINGE
INTRAMUSCULAR | Status: DC | PRN
  Administered 2017-12-11: 60 mg via INTRAVENOUS

## 2017-12-11 MED ORDER — HYDROMORPHONE HCL 1 MG/ML IJ SOLN
INTRAMUSCULAR | Status: AC
  Filled 2017-12-11: qty 1

## 2017-12-11 MED ORDER — LACTATED RINGERS IR SOLN
Status: DC | PRN
  Administered 2017-12-11: 1000 mL

## 2017-12-11 MED ORDER — HYDROMORPHONE HCL 1 MG/ML IJ SOLN: 0.5000 mg | INTRAMUSCULAR | Status: DC | PRN

## 2017-12-11 MED ORDER — DEXAMETHASONE SODIUM PHOSPHATE 10 MG/ML IJ SOLN
INTRAMUSCULAR | Status: AC
  Filled 2017-12-11: qty 1

## 2017-12-11 MED ORDER — PROMETHAZINE HCL 25 MG/ML IJ SOLN: 6.2500 mg | INTRAMUSCULAR | Status: DC | PRN

## 2017-12-11 MED ORDER — MIDAZOLAM HCL 2 MG/2ML IJ SOLN
INTRAMUSCULAR | Status: AC
  Filled 2017-12-11: qty 2

## 2017-12-11 MED ORDER — MEPERIDINE HCL 50 MG/ML IJ SOLN: 6.2500 mg | INTRAMUSCULAR | Status: DC | PRN

## 2017-12-11 MED ORDER — LABETALOL HCL 5 MG/ML IV SOLN
10.0000 mg | Freq: Once | INTRAVENOUS | Status: AC
  Administered 2017-12-11: 10 mg via INTRAVENOUS

## 2017-12-11 MED ORDER — DEXTROSE-NACL 5-0.45 % IV SOLN
INTRAVENOUS | Status: DC
  Administered 2017-12-11 – 2017-12-12 (×2): via INTRAVENOUS

## 2017-12-11 MED ORDER — FENTANYL CITRATE (PF) 100 MCG/2ML IJ SOLN
INTRAMUSCULAR | Status: DC | PRN
  Administered 2017-12-11 (×2): 50 ug via INTRAVENOUS
  Administered 2017-12-11: 100 ug via INTRAVENOUS
  Administered 2017-12-11 (×3): 50 ug via INTRAVENOUS

## 2017-12-11 MED ORDER — SUGAMMADEX SODIUM 200 MG/2ML IV SOLN
INTRAVENOUS | Status: DC | PRN
  Administered 2017-12-11: 150 mg via INTRAVENOUS

## 2017-12-11 MED ORDER — DEXAMETHASONE SODIUM PHOSPHATE 10 MG/ML IJ SOLN
INTRAMUSCULAR | Status: DC | PRN
  Administered 2017-12-11: 5 mg via INTRAVENOUS

## 2017-12-11 MED ORDER — ONDANSETRON HCL 4 MG/2ML IJ SOLN
INTRAMUSCULAR | Status: AC
  Filled 2017-12-11: qty 2

## 2017-12-11 MED ORDER — SODIUM CHLORIDE 0.9 % IJ SOLN
INTRAMUSCULAR | Status: DC | PRN
  Administered 2017-12-11: 20 mL

## 2017-12-11 MED ORDER — CITALOPRAM HYDROBROMIDE 20 MG PO TABS
20.0000 mg | ORAL_TABLET | Freq: Every day | ORAL | Status: DC
  Administered 2017-12-11 – 2017-12-12 (×2): 20 mg via ORAL
  Filled 2017-12-11 (×2): qty 1

## 2017-12-11 MED ORDER — PROPOFOL 10 MG/ML IV BOLUS
INTRAVENOUS | Status: AC
  Filled 2017-12-11: qty 20

## 2017-12-11 MED ORDER — CLINDAMYCIN PHOSPHATE 900 MG/50ML IV SOLN
900.0000 mg | INTRAVENOUS | Status: AC
  Administered 2017-12-11: 900 mg via INTRAVENOUS
  Filled 2017-12-11: qty 50

## 2017-12-11 MED ORDER — PHENYLEPHRINE 40 MCG/ML (10ML) SYRINGE FOR IV PUSH (FOR BLOOD PRESSURE SUPPORT)
PREFILLED_SYRINGE | INTRAVENOUS | Status: DC | PRN
  Administered 2017-12-11: 80 ug via INTRAVENOUS

## 2017-12-11 MED ORDER — SCOPOLAMINE 1 MG/3DAYS TD PT72
1.0000 | MEDICATED_PATCH | TRANSDERMAL | Status: DC
  Administered 2017-12-11: 1.5 mg via TRANSDERMAL

## 2017-12-11 MED ORDER — LACTATED RINGERS IV SOLN
INTRAVENOUS | Status: DC
  Administered 2017-12-11 (×4): via INTRAVENOUS

## 2017-12-11 MED ORDER — ONDANSETRON HCL 4 MG/2ML IJ SOLN
INTRAMUSCULAR | Status: DC | PRN
  Administered 2017-12-11: 4 mg via INTRAVENOUS

## 2017-12-11 MED ORDER — HYDROCODONE-ACETAMINOPHEN 5-325 MG PO TABS: 1.0000 | ORAL_TABLET | ORAL | 0 refills | Status: DC | PRN

## 2017-12-11 MED ORDER — FENTANYL CITRATE (PF) 250 MCG/5ML IJ SOLN
INTRAMUSCULAR | Status: AC
  Filled 2017-12-11: qty 5

## 2017-12-11 MED ORDER — STERILE WATER FOR IRRIGATION IR SOLN
Status: DC | PRN
  Administered 2017-12-11: 1000 mL

## 2017-12-11 MED ORDER — PROPOFOL 10 MG/ML IV BOLUS
INTRAVENOUS | Status: DC | PRN
  Administered 2017-12-11: 150 mg via INTRAVENOUS
  Administered 2017-12-11: 30 mg via INTRAVENOUS

## 2017-12-11 MED ORDER — DIPHENHYDRAMINE HCL 50 MG/ML IJ SOLN: 12.5000 mg | Freq: Four times a day (QID) | INTRAMUSCULAR | Status: DC | PRN

## 2017-12-11 MED ORDER — LABETALOL HCL 5 MG/ML IV SOLN
INTRAVENOUS | Status: AC
  Filled 2017-12-11: qty 4

## 2017-12-11 MED ORDER — LIDOCAINE 2% (20 MG/ML) 5 ML SYRINGE
INTRAMUSCULAR | Status: AC
  Filled 2017-12-11: qty 5

## 2017-12-11 MED ORDER — SODIUM CHLORIDE 0.9 % IJ SOLN
INTRAMUSCULAR | Status: AC
  Filled 2017-12-11: qty 20

## 2017-12-11 MED ORDER — ROCURONIUM BROMIDE 10 MG/ML (PF) SYRINGE
PREFILLED_SYRINGE | INTRAVENOUS | Status: DC | PRN
  Administered 2017-12-11 (×3): 10 mg via INTRAVENOUS
  Administered 2017-12-11: 50 mg via INTRAVENOUS

## 2017-12-11 MED ORDER — FENTANYL CITRATE (PF) 100 MCG/2ML IJ SOLN
INTRAMUSCULAR | Status: AC
  Filled 2017-12-11: qty 2

## 2017-12-11 MED ORDER — OXYCODONE HCL 5 MG PO TABS
5.0000 mg | ORAL_TABLET | ORAL | Status: DC | PRN
  Administered 2017-12-11 – 2017-12-13 (×8): 5 mg via ORAL
  Filled 2017-12-11 (×8): qty 1

## 2017-12-11 MED ORDER — CIPROFLOXACIN IN D5W 400 MG/200ML IV SOLN
400.0000 mg | INTRAVENOUS | Status: AC
  Administered 2017-12-11: 400 mg via INTRAVENOUS
  Filled 2017-12-11: qty 200

## 2017-12-11 MED ORDER — MIDAZOLAM HCL 5 MG/5ML IJ SOLN
INTRAMUSCULAR | Status: DC | PRN
  Administered 2017-12-11: 2 mg via INTRAVENOUS

## 2017-12-11 MED ORDER — HYDROMORPHONE HCL 1 MG/ML IJ SOLN
0.2500 mg | INTRAMUSCULAR | Status: DC | PRN
  Administered 2017-12-11 (×4): 0.5 mg via INTRAVENOUS

## 2017-12-11 MED ORDER — SCOPOLAMINE 1 MG/3DAYS TD PT72
MEDICATED_PATCH | TRANSDERMAL | Status: AC
  Filled 2017-12-11: qty 1

## 2017-12-11 MED ORDER — ACETAMINOPHEN 500 MG PO TABS
1000.0000 mg | ORAL_TABLET | Freq: Four times a day (QID) | ORAL | Status: AC
  Administered 2017-12-12 (×2): 1000 mg via ORAL
  Filled 2017-12-11 (×2): qty 2

## 2017-12-11 MED ORDER — DIPHENHYDRAMINE HCL 12.5 MG/5ML PO ELIX: 12.5000 mg | ORAL_SOLUTION | Freq: Four times a day (QID) | ORAL | Status: DC | PRN

## 2017-12-11 SURGICAL SUPPLY — 67 items
BAG LAPAROSCOPIC 12 15 PORT 16 (BASKET) ×1 IMPLANT
BAG RETRIEVAL 12/15 (BASKET) ×2
BAG RETRIEVAL 12/15MM (BASKET) ×1
CHLORAPREP W/TINT 26ML (MISCELLANEOUS) ×3 IMPLANT
CLIP VESOLOCK LG 6/CT PURPLE (CLIP) ×3 IMPLANT
CLIP VESOLOCK MED LG 6/CT (CLIP) ×3 IMPLANT
CLIP VESOLOCK XL 6/CT (CLIP) ×3 IMPLANT
COVER SURGICAL LIGHT HANDLE (MISCELLANEOUS) ×3 IMPLANT
COVER TIP SHEARS 8 DVNC (MISCELLANEOUS) ×1 IMPLANT
COVER TIP SHEARS 8MM DA VINCI (MISCELLANEOUS) ×2
CUTTER ECHEON FLEX ENDO 45 340 (ENDOMECHANICALS) ×3 IMPLANT
DECANTER SPIKE VIAL GLASS SM (MISCELLANEOUS) ×3 IMPLANT
DERMABOND ADVANCED (GAUZE/BANDAGES/DRESSINGS) ×2
DERMABOND ADVANCED .7 DNX12 (GAUZE/BANDAGES/DRESSINGS) ×1 IMPLANT
DRAIN CHANNEL 15F RND FF 3/16 (WOUND CARE) ×3 IMPLANT
DRAPE ARM DVNC X/XI (DISPOSABLE) ×4 IMPLANT
DRAPE COLUMN DVNC XI (DISPOSABLE) ×1 IMPLANT
DRAPE DA VINCI XI ARM (DISPOSABLE) ×8
DRAPE DA VINCI XI COLUMN (DISPOSABLE) ×2
DRAPE INCISE IOBAN 66X45 STRL (DRAPES) ×3 IMPLANT
DRAPE SHEET LG 3/4 BI-LAMINATE (DRAPES) ×3 IMPLANT
DRSG TEGADERM 2-3/8X2-3/4 SM (GAUZE/BANDAGES/DRESSINGS) ×3 IMPLANT
ELECT PENCIL ROCKER SW 15FT (MISCELLANEOUS) ×3 IMPLANT
ELECT REM PT RETURN 15FT ADLT (MISCELLANEOUS) ×3 IMPLANT
EVACUATOR SILICONE 100CC (DRAIN) ×3 IMPLANT
GAUZE SPONGE 2X2 8PLY STRL LF (GAUZE/BANDAGES/DRESSINGS) ×1 IMPLANT
GLOVE BIO SURGEON STRL SZ 6.5 (GLOVE) ×2 IMPLANT
GLOVE BIO SURGEONS STRL SZ 6.5 (GLOVE) ×1
GLOVE BIOGEL M STRL SZ7.5 (GLOVE) ×6 IMPLANT
GOWN STRL REUS W/TWL LRG LVL3 (GOWN DISPOSABLE) ×9 IMPLANT
IRRIG SUCT STRYKERFLOW 2 WTIP (MISCELLANEOUS) ×3
IRRIGATION SUCT STRKRFLW 2 WTP (MISCELLANEOUS) ×1 IMPLANT
KIT BASIN OR (CUSTOM PROCEDURE TRAY) ×3 IMPLANT
LOOP VESSEL MAXI BLUE (MISCELLANEOUS) ×3 IMPLANT
MARKER SKIN DUAL TIP RULER LAB (MISCELLANEOUS) ×3 IMPLANT
NEEDLE INSUFFLATION 14GA 120MM (NEEDLE) ×3 IMPLANT
NS IRRIG 1000ML POUR BTL (IV SOLUTION) IMPLANT
PORT ACCESS TROCAR AIRSEAL 12 (TROCAR) ×1 IMPLANT
PORT ACCESS TROCAR AIRSEAL 5M (TROCAR) ×2
POSITIONER SURGICAL ARM (MISCELLANEOUS) ×6 IMPLANT
RELOAD STAPLER WHITE 60MM (STAPLE) IMPLANT
SEAL CANN UNIV 5-8 DVNC XI (MISCELLANEOUS) ×4 IMPLANT
SEAL XI 5MM-8MM UNIVERSAL (MISCELLANEOUS) ×8
SET TRI-LUMEN FLTR TB AIRSEAL (TUBING) ×3 IMPLANT
SOLUTION ELECTROLUBE (MISCELLANEOUS) ×3 IMPLANT
SPONGE GAUZE 2X2 STER 10/PKG (GAUZE/BANDAGES/DRESSINGS) ×2
STAPLE ECHEON FLEX 60 POW ENDO (STAPLE) IMPLANT
STAPLE RELOAD 45 WHT (STAPLE) ×5 IMPLANT
STAPLE RELOAD 45MM WHITE (STAPLE) ×10
STAPLER RELOAD WHITE 60MM (STAPLE)
SUT ETHILON 3 0 PS 1 (SUTURE) ×3 IMPLANT
SUT MNCRL AB 4-0 PS2 18 (SUTURE) ×6 IMPLANT
SUT PDS AB 1 CT1 27 (SUTURE) ×9 IMPLANT
SUT V-LOC BARB 180 2/0GR6 GS22 (SUTURE) ×3
SUT VIC AB 2-0 SH 27 (SUTURE) ×2
SUT VIC AB 2-0 SH 27X BRD (SUTURE) ×1 IMPLANT
SUT VLOC BARB 180 ABS3/0GR12 (SUTURE) ×3
SUTURE V-LC BRB 180 2/0GR6GS22 (SUTURE) ×1 IMPLANT
SUTURE VLOC BRB 180 ABS3/0GR12 (SUTURE) ×1 IMPLANT
TOWEL OR 17X26 10 PK STRL BLUE (TOWEL DISPOSABLE) IMPLANT
TOWEL OR NON WOVEN STRL DISP B (DISPOSABLE) ×3 IMPLANT
TRAY FOLEY MTR SLVR 16FR STAT (SET/KITS/TRAYS/PACK) ×3 IMPLANT
TRAY LAPAROSCOPIC (CUSTOM PROCEDURE TRAY) ×3 IMPLANT
TROCAR BLADELESS OPT 12M 100M (ENDOMECHANICALS) ×3 IMPLANT
TROCAR BLADELESS OPT 5 100 (ENDOMECHANICALS) IMPLANT
TROCAR XCEL 12X100 BLDLESS (ENDOMECHANICALS) ×3 IMPLANT
WATER STERILE IRR 1000ML POUR (IV SOLUTION) ×3 IMPLANT

## 2017-12-11 NOTE — Transfer of Care (Signed)
Immediate Anesthesia Transfer of Care Note  Patient: Michael Brady  Procedure(s) Performed: XI ROBOT ASSITED LAPAROSCOPIC NEPHROURETERECTOMY (Left )  Patient Location: PACU  Anesthesia Type:General  Level of Consciousness: sedated, patient cooperative and responds to stimulation  Airway & Oxygen Therapy: Patient Spontanous Breathing and Patient connected to face mask oxygen  Post-op Assessment: Report given to RN and Post -op Vital signs reviewed and stable  Post vital signs: Reviewed and stable  Last Vitals:  Vitals Value Taken Time  BP 166/95 12/11/2017  3:52 PM  Temp    Pulse 72 12/11/2017  3:53 PM  Resp 21 12/11/2017  3:53 PM  SpO2 100 % 12/11/2017  3:53 PM  Vitals shown include unvalidated device data.  Last Pain:  Vitals:   12/11/17 1010  TempSrc: Oral         Complications: No apparent anesthesia complications

## 2017-12-11 NOTE — H&P (Signed)
Michael Brady is an 62 y.o. male.    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 1 artery / 1 vein left renovascular anatomy. Cr 1.07. NO contralateral or lower tract tumors.   Recent Course:  10/2017 - Recurrence lower pole tumor, partially ablated, path low grade, 6x26 stent left in place. == > scheduled for left neph-U 12/11/2017.    PMH sig for shoulder surgery, No CV disease / blood thinners. His PCP is [name] MD.   Today " [name]" is seen to proceed with LEFT neph-U. He had cards clearance by Dr. Cathie Olden in interval and felt low risk.    Past Medical History:  Diagnosis Date  . Cancer ([place])    left renal pelvis cnacer-12/05/2017  . Diverticulosis of colon    NO ISSUES IN OVER 12 YEARS   . Dysuria   . Hematuria    10-11-17: REPORTS I "NOTICED SOME BLOOD IN MY URINE TODAY AFTER NOT SEEING ANY SINCE MY SURGERY IN [place] . I JUST HAD MY BLOOD DRAWN AT [facility] THIS LAST MONDAY BUT THEY HAVENT CALLED ME ABOUT IT YET"  . History of closed dislocation of shoulder 06/2014   LEFT --- POST IV SEDATION CLOSED REDUCTION IN ED  . PONV (postoperative nausea and vomiting)   . Renal mass, left    LEFT RENAL COLLECTING SYSTEM MASS  . Smokers' cough ([place])   . Wears dentures   . Wears glasses     Past Surgical History:  Procedure Laterality Date  . CARPAL TUNNEL RELEASE Left 01-25-2006   dr Lorin [facility]  [facility]   and LEFT THUMB PULLEY RELEASE  . COLONOSCOPY N/A 05/07/2017   Procedure: COLONOSCOPY;  Surgeon: Aviva Signs, MD;  [facility];  Service: Gastroenterology;  Laterality: N/A;  . CYSTOSCOPY WITH URETEROSCOPY AND STENT PLACEMENT Left 07/29/2017   Procedure: CYSTOSCOPY WITH URETEROSCOPY AND STENT PLACEMENT;  Surgeon: [name], MD;  Location: [facility];  Service: Urology;  Laterality: Left;  .  CYSTOSCOPY/URETEROSCOPY/HOLMIUM LASER/STENT PLACEMENT Left 07/15/2017   Procedure: CYSTOSCOPY, ATTEMTED URETEROSCOPY/,STENT PLACEMENT;  Surgeon: [name], MD;  Location: [facility];  Service: Urology;  Laterality: Left;  . CYSTOSCOPY/URETEROSCOPY/HOLMIUM LASER/STENT PLACEMENT Left 10/21/2017   Procedure: CYSTOSCOPY, RETROGRADE /URETEROSCOPY LEFT UPPER TRACT,BIOPSY, [name] LASER/STENT PLACEMENT;  Surgeon: [name], MD;  Location: [facility];  Service: Urology;  Laterality: Left;  . HOLMIUM LASER APPLICATION Left 9/62/9528   Procedure: HOLMIUM LASER APPLICATION;  Surgeon: [name], MD;  Location: [facility];  Service: Urology;  Laterality: Left;  . SHOULDER SURGERY Right 1997   ROTATOR CUFF REPAIR   . THULIUM LASER TURP (TRANSURETHRAL RESECTION OF PROSTATE) Left 07/29/2017   Procedure: CYSTOSCOPY, LEFT URETEROSCOPY WITH TUMOR BIOPSYTHULLIUM LASER ABLATION OF TUMOR AND LEFT URETERAL STENT EXCHANGE;  Surgeon: [name], MD;  Location: [facility];  Service: Urology;  Laterality: Left;  . WISDOM TOOTH EXTRACTION      Family History  Problem Relation Age of Onset  . Colon cancer Neg Hx    Social History:  reports that he has been smoking cigarettes.  He has a 60.00 pack-year smoking history. He has never used smokeless tobacco. He reports that he does not drink alcohol or use drugs.  Allergies:  Allergies  Allergen Reactions  . Penicillins Swelling  Has patient had a PCN reaction causing immediate rash, facial/tongue/throat swelling, SOB or lightheadedness with hypotension: Yes Has patient had a PCN reaction causing severe rash involving mucus membranes or skin necrosis: Yes Has patient had a PCN reaction that required hospitalization: No Has patient had a PCN reaction occurring within the last 10 years: No If all of the above answers are "NO", then may proceed with Cephalosporin use.      No medications prior to admission.    No results found for this or any previous visit (from the past 48 hour(s)). No results found.  Review of Systems  Constitutional: Negative.  Negative for chills and fever.  HENT: Negative.   Eyes: Negative.   Respiratory: Negative.   Cardiovascular: Negative.   Gastrointestinal: Negative.   Genitourinary: Positive for hematuria and urgency.  Musculoskeletal: Negative.   Skin: Negative.   Neurological: Negative.   Endo/Heme/Allergies: Negative.   Psychiatric/Behavioral: Negative.     There were no vitals taken for this visit. Physical Exam  Constitutional: He appears well-developed.  HENT:  Head: Normocephalic.  Eyes: Pupils are equal, round, and reactive to light.  Neck: Normal range of motion.  Cardiovascular: Normal rate.  Respiratory: Effort normal.  GI: Soft.  Genitourinary:  Genitourinary Comments: No CVAT at present  Musculoskeletal: Normal range of motion.  Neurological: He is alert.  Skin: Skin is warm.     Assessment/Plan  Proceed as planned with LEFT robotic nephro-ureterectomy. Risks, benefits, alternatives, expected peri-op course discussed previously and reiterated today.   Alexis Frock, MD 12/11/2017, 6:45 AM

## 2017-12-11 NOTE — Discharge Instructions (Signed)

## 2017-12-11 NOTE — Anesthesia Procedure Notes (Signed)
Procedure Name: Intubation Performed by: Gean Maidens, CRNA Pre-anesthesia Checklist: Patient identified, Emergency Drugs available, Suction available, Patient being monitored and Timeout performed Patient Re-evaluated:Patient Re-evaluated prior to induction Oxygen Delivery Method: Circle system utilized Preoxygenation: Pre-oxygenation with 100% oxygen Induction Type: IV induction Ventilation: Mask ventilation without difficulty Laryngoscope Size: Mac and 4 Grade View: Grade I Tube type: Oral Tube size: 7.5 mm Number of attempts: 1 Airway Equipment and Method: Stylet Placement Confirmation: ETT inserted through vocal cords under direct vision,  positive ETCO2,  CO2 detector and breath sounds checked- equal and bilateral Secured at: 23 cm Tube secured with: Tape Dental Injury: Teeth and Oropharynx as per pre-operative assessment

## 2017-12-11 NOTE — Brief Op Note (Signed)
12/11/2017  3:39 PM  PATIENT:  Michael Brady  62 y.o. male  PRE-OPERATIVE DIAGNOSIS:  LEFT RENAL PELVIS CANCER  POST-OPERATIVE DIAGNOSIS:  LEFT RENAL PELVIS CANCER  PROCEDURE:  Procedure(s): XI ROBOT ASSITED LAPAROSCOPIC NEPHROURETERECTOMY (Left)  SURGEON:  Surgeon(s) and Role:    Alexis Frock, MD - Primary  PHYSICIAN ASSISTANT:   ASSISTANTS: Clemetine Marker PA   ANESTHESIA:   local and general  EBL:  150 mL   BLOOD ADMINISTERED:none  DRAINS: 1 - JP to bulb, 2 - Foley to gravity   LOCAL MEDICATIONS USED:  MARCAINE     SPECIMEN:  Source of Specimen:  Left Kindey + ureter + partial adrenal  DISPOSITION OF SPECIMEN:  PATHOLOGY  COUNTS:  YES  TOURNIQUET:  * No tourniquets in log *   DICTATION: .Other Dictation: Dictation Number [id number]  PLAN OF CARE: Admit to inpatient   PATIENT DISPOSITION:  PACU - hemodynamically stable.   Delay start of Pharmacological VTE agent (>24hrs) due to surgical blood loss or risk of bleeding: yes

## 2017-12-11 NOTE — Anesthesia Preprocedure Evaluation (Addendum)
Anesthesia Evaluation  Patient identified by MRN, date of birth, ID band Patient awake    Reviewed: Allergy & Precautions, NPO status , Patient's Chart, lab work & pertinent test results  History of Anesthesia Complications (+) PONV and history of anesthetic complications  Airway Mallampati: II  TM Distance: >3 FB Neck ROM: Full    Dental  (+) Lower Dentures, Upper Dentures   Pulmonary Current Smoker,    Pulmonary exam normal breath sounds clear to auscultation       Cardiovascular negative cardio ROS Normal cardiovascular exam Rhythm:Regular Rate:Normal     Neuro/Psych PSYCHIATRIC DISORDERS Depression negative neurological ROS     GI/Hepatic negative GI ROS, Neg liver ROS,   Endo/Other  negative endocrine ROS  Renal/GU Renal diseaseLeft Renal pelvis Ca  negative genitourinary   Musculoskeletal negative musculoskeletal ROS (+)   Abdominal   Peds  Hematology negative hematology ROS (+)   Anesthesia Other Findings   Reproductive/Obstetrics                            Anesthesia Physical Anesthesia Plan  ASA: II  Anesthesia Plan: General   Post-op Pain Management:    Induction: Intravenous  PONV Risk Score and Plan: 4 or greater and Scopolamine patch - Pre-op, Midazolam, Dexamethasone, Ondansetron and Treatment may vary due to age or medical condition  Airway Management Planned: Oral ETT  Additional Equipment:   Intra-op Plan:   Post-operative Plan: Extubation in OR  Informed Consent: I have reviewed the patients History and Physical, chart, labs and discussed the procedure including the risks, benefits and alternatives for the proposed anesthesia with the patient or authorized representative who has indicated his/her understanding and acceptance.   Dental advisory given  Plan Discussed with: CRNA, Anesthesiologist and Surgeon  Anesthesia Plan Comments:          Anesthesia Quick Evaluation

## 2017-12-11 NOTE — Op Note (Signed)
NAME: Michael Brady, FEUTZ MEDICAL RECORD [id number] ACCOUNT [id number] DATE OF BIRTH:1956/04/24 FACILITY: [facility] LOCATION: [facility] PHYSICIAN:Damarkus Balis, MD  OPERATIVE REPORT  DATE OF PROCEDURE:  12/11/2017  PREOPERATIVE DIAGNOSIS:  Large volume left renal pelvis cancer with  recurrent hematuria.  PROCEDURE PERFORMED:  Robotic-assisted laparoscopic left nephroureterectomy.  ESTIMATED BLOOD LOSS:  100 mL.  COMPLICATIONS:  None.  SPECIMENS:  Left kidney plus ureter plus portion of adrenal gland en bloc.  ASSISTANT:   Judithann Graves.  FINDINGS: 1.  Single artery, single vein, left renal vascular anatomy with large lumbar vein as anticipated. 2.  Significant desmoplastic reaction of the middle half of the ureter, likely related to stenting.  INDICATIONS:  The patient is a pleasant 62 year old gentleman who was initially found on workup of gross hematuria to have a very large volume left renal pelvis cancer.  He was initially on a path of staged endoscopic ablation and had several stages of  this performed.  However, his tumor was much more extensive than previously thought and he tolerated the staged procedures very poorly.  Also discussed management including staged ablation versus more definitive therapy with left nephroureterectomy as  the disease is essentially nonmanageable endoscopically and he wished to proceed with the latter.  Informed consent was obtained and placed in medical record.    DESCRIPTION OF PROCEDURE:  Patient was identified as being Michael Brady and bridging left ureteronephrectomy was confirmed.  Using excellent general endotracheal anesthesia introduced.  The patient was doing the left side up, full flank position  and placed in 15 degrees table flexed and superior elevator axillary roll,  sequential compression devices.  The bottom leg bent, top leg straight.  He was further fastened to operative table with 3 inch tape with foam padding across the superior  xiphoid  chest and his pelvis after Foley catheter was placed for it to straight drain.  A sterile field was created by preparing and draping the entire left flank and abdomen using chlorhexidine gluconate.  In the high-flow, low pressure pneumoperitoneum was  taken using Veress technique.  In the left lower quadrant having passed the aspiration and drop test an 8 mm robotic camera port was then placed in position approximately 4 fingerbreadths superolateral to the umbilicus.  Laparoscopy was admitted to the  peritoneal cavity and no significant adhesions and no visceral injury.  Distal portion placement was as follows:  Left subcostal 8 mm robotic port.  This was exaggerated superolaterally, left.  Far lateral 8 mm robotic port approximately 2 fingerbreadths  superomedial to the anterior iliac spine, left inferior paramedian robotic port approximately 4 fingerbreadths superior to the pubic ramus.  This was exaggerated inferomedially and two 12 mm assistant port sites in the midline, 1 in the infraumbilical  crease and another approximately 1 fingerbreadth above the level of the camera port.  The superior 1 being air seal type.  Robot was docked and past electronic checks.  The incision was then developed in the retroperitoneum 1 cm lateral to the descending  colon from the area of the splenic flexure towards the area of the internal ring.  The colon was carefully separated medially.  The plane was chosen anterior to Gerota's fascia.  The lower pole of the kidney, an area was identified including gentle  lateral traction and lateral splenic attachments were also released up the spleen to rotate medially away from the anterior surface of Gerota's fascia and some additional dissection was performed releasing the posterior aspect of the pancreas away from  the anterior aspect to Gerota's fascia.  The ureter and gonadals were encountered just above the level of the psoas muscle.  There was significant  desmoplastic reaction around the middle half of the ureter.  Dissection proceeded with this triangle of the  ureter, gonadal and psoas musculature, superiorly to the area of the renal hilum.  Renal hilum consisted of a single artery, single vein and renovascular anatomy as anticipated.  There was quite a large lumbar vein noted at the inferior aspect of the  renal vein.  This obscured a successful safe window to the artery.  Therefore, the lumbar vein was controlled using vascular stapler which resulted in excellent hemostatic control of the structure and allowed the renal vein to rotate more medially  allowing much better visualization of the artery which was single.  The artery was controlled using an extra-large Hem-o-Lok clip proximally followed by the vascular stapler distally.  The vein was controlled using a vascular stapler choosing a plane  just medial to the inferior adrenal branches.  Additional vascular stapler was applied at the superior medial border of the kidney which had inherently resected approximately 2/3 of the left adrenal gland.  Superior and lateral attachments were taken  down using cautery scissors and completely freed up the kidney portion of the nephroureterectomy specimen.  The ureter was then circumferentially mobilized distally towards the area of the iliac vessels.  The gonadal vein was doubly clipped and ligated.   Again, there was significant desmoplastic reaction around approximately the mid half of the left ureter.  This will likely be due to stenting.  There is no obvious extraluminal disease noted near the risk further dissected into the deep pelvis by  dissecting laterally to the left medial umbilical ligament forming a peritoneal shelf lateral to the bladder.  Dissection proceeded circumferentially down to the level of the ureterovesical junction and intramural ureter area.  At this point, an  extra-large clip was placed across the left ureter and stent and block to  prevent downward spillage and formal distal ureteral dissection was performed circumferentially but performing minimal bladder cuff dissection.  The bladder neck interface was then  oversewn using a running 2-0 V-Loc which showed an excellent apposition of the bladder.  The left kidney and ureter specimen was placed in an EndoCatch bag for later retrieval.  All sponge and needle counts were correct.  Hemostasis appeared excellent.   A closed suction drain was brought through the previous left lateral most port site into the peritoneal cavity.  The robot was then undocked and was retrieved by connecting the 2 previous assistant port sites essentially in the midline airing on the  left side of the umbilicus.  The left kidney and ureter specimen with part of the adrenal gland en bloc was set aside for pathology extraction and then closed the fascia using figure-of-eight PDS x5.  Further reapproximation of Scarpa's with a running  Vicryl.  All incision sites were infiltrated with dilute lipolyzed Marcaine and closed the skin using subcuticular Monocryl and Dermabond.  The procedure was terminated.    The patient tolerated procedure well.  No immediate apparent complications.  The patient in the Postanesthesia Care Unit in stable condition.    Please note  Debbrah Alar was crucial for all robotic portions of the procedure today.  She provided invaluable vascular stapling, vascular clipping, specimen manipulation, retraction, suctioning.  AN/NUANCE  D:12/11/2017 T:12/11/2017 JOB:[id number]

## 2017-12-12 ENCOUNTER — Encounter (HOSPITAL_COMMUNITY): Payer: Self-pay | Admitting: Urology

## 2017-12-12 LAB — BASIC METABOLIC PANEL
Anion gap: 7 (ref 5–15)
BUN: 17 mg/dL (ref 6–20)
CHLORIDE: 99 mmol/L — AB (ref 101–111)
CO2: 27 mmol/L (ref 22–32)
CREATININE: 1.37 mg/dL — AB (ref 0.61–1.24)
Calcium: 8.5 mg/dL — ABNORMAL LOW (ref 8.9–10.3)
GFR, EST NON AFRICAN AMERICAN: 55 mL/min — AB (ref >60)
Glucose, Bld: 146 mg/dL — ABNORMAL HIGH (ref 65–99)
Potassium: 4.5 mmol/L (ref 3.5–5.1)
SODIUM: 133 mmol/L — AB (ref 135–145)

## 2017-12-12 LAB — HEMOGLOBIN AND HEMATOCRIT, BLOOD
HEMATOCRIT: 38 % — AB (ref 39.0–52.0)
HEMOGLOBIN: 12.4 g/dL — AB (ref 13.0–17.0)

## 2017-12-12 MED ORDER — ENSURE ENLIVE PO LIQD
237.0000 mL | Freq: Two times a day (BID) | ORAL | Status: DC
  Administered 2017-12-13: 237 mL via ORAL

## 2017-12-12 NOTE — Progress Notes (Signed)
Initial Nutrition Assessment  DOCUMENTATION CODES:   Non-severe (moderate) malnutrition in context of chronic illness  INTERVENTION:   Ensure Enlive po BID, each supplement provides 350 kcal and 20 grams of protein  NUTRITION DIAGNOSIS:   Moderate Malnutrition related to chronic illness, cancer and cancer related treatments as evidenced by energy intake < 75% for > or equal to 1 month, mild fat depletion, moderate muscle depletion.  GOAL:   Patient will meet greater than or equal to 90% of their needs  MONITOR:   PO intake, Supplement acceptance, Weight trends, Labs  REASON FOR ASSESSMENT:   Malnutrition Screening Tool    ASSESSMENT:   Patient with PMH significant for left renal pelvis cancer, left renal mass, and diverticulosis. Presents this admission for left robotic nephroureterectomy on 6/5.    Pt reports his PO intake decreased 2-3 months leading up to this admission. States he didn't have much of an appetite and food wasn't appealing He would often eat a biscuit in the morning and graze on crackers/nabs the rest of the day. Pt does not use supplementation at home but reports drinking a [employer] energy drink that had protein in it. Discussed the importance of high calorie high protein intake for preservation of lean body mass. Talked about possible options pt could have at home if appetite remains poor. Pt amenable to trying Ensure this stay.   Pt endorses a UBW of 175 lb. Records indicate pt weighed 174 lb on 06/01/18 and 160 lb this admission (8% wt loss in 8 months, insignificant for time frame). Nutrition-Focused physical exam completed.   Medications reviewed.  Labs reviewed: Na 133 (L)   NUTRITION - FOCUSED PHYSICAL EXAM:    Most Recent Value  Orbital Region  No depletion  Upper Arm Region  Moderate depletion  Thoracic and Lumbar Region  Unable to assess  Buccal Region  Mild depletion  Temple Region  Mild depletion  Clavicle Bone Region  Moderate depletion   Clavicle and Acromion Bone Region  Moderate depletion  Scapular Bone Region  Unable to assess  Dorsal Hand  No depletion  Patellar Region  Mild depletion  Anterior Thigh Region  Mild depletion  Posterior Calf Region  Mild depletion  Edema (RD Assessment)  None  Hair  Reviewed  Eyes  Reviewed  Mouth  Reviewed  Skin  Reviewed  Nails  Reviewed       Diet Order:   Diet Order           Diet regular Room service appropriate? Yes; Fluid consistency: Thin  Diet effective now          EDUCATION NEEDS:   Education needs have been addressed  Skin:  Skin Assessment: Skin Integrity Issues: Skin Integrity Issues:: Incisions Incisions: left abdomen  Last BM:  12/11/17  Height:   Ht Readings from Last 1 Encounters:  12/11/17 5\' 9"  (1.753 m)    Weight:   Wt Readings from Last 1 Encounters:  12/11/17 160 lb (72.6 kg)    Ideal Body Weight:  72.7 kg  BMI:  Body mass index is 23.63 kg/m.  Estimated Nutritional Needs:   Kcal:  2150-2350 kcal  Protein:  110-120 g  Fluid:  >2.1 L/day    Mariana Single RD, LDN Clinical Nutrition Pager # - [phone]

## 2017-12-12 NOTE — Anesthesia Postprocedure Evaluation (Signed)
Anesthesia Post Note  Patient: Michael Brady  Procedure(s) Performed: XI ROBOT ASSITED LAPAROSCOPIC NEPHROURETERECTOMY (Left )     Patient location during evaluation: PACU Anesthesia Type: General Level of consciousness: awake and alert and oriented Pain management: pain level controlled Vital Signs Assessment: post-procedure vital signs reviewed and stable Respiratory status: spontaneous breathing, nonlabored ventilation, respiratory function stable and patient connected to nasal cannula oxygen Cardiovascular status: blood pressure returned to baseline and stable Postop Assessment: no apparent nausea or vomiting Anesthetic complications: no                  Neah Sporrer A.

## 2017-12-13 LAB — CREATININE, FLUID (PLEURAL, PERITONEAL, JP DRAINAGE): CREAT FL: 1.6 mg/dL

## 2017-12-13 MED ORDER — SULFAMETHOXAZOLE-TRIMETHOPRIM 400-80 MG PO TABS: 1.0000 | ORAL_TABLET | Freq: Two times a day (BID) | ORAL | 0 refills | Status: DC

## 2017-12-13 MED ORDER — SENNOSIDES-DOCUSATE SODIUM 8.6-50 MG PO TABS: 1.0000 | ORAL_TABLET | Freq: Two times a day (BID) | ORAL | 0 refills | Status: DC

## 2017-12-13 NOTE — Progress Notes (Cosign Needed)
Urology Progress Note   2 Days Post-Op  Subjective: NAEON.  Tolerating diet  Ambulating  No n/v   Objective: Vital signs in last 24 hours: Temp:  [98.4 F (36.9 C)-99.3 F (37.4 C)] 98.5 F (36.9 C) (06/07 0525) Pulse Rate:  [72-79] 72 (06/07 0525) Resp:  [16-18] 16 (06/07 0525) BP: (113-149)/(73-87) 149/82 (06/07 0525) SpO2:  [92 %-97 %] 92 % (06/07 0525)  Intake/Output from previous day: 06/06 0701 - 06/07 0700 In: 240 [P.O.:240] Out: 1331 [Urine:1301; Drains:30] Intake/Output this shift: No intake/output data recorded.  Physical Exam:  General: Alert and oriented CV: RRR Lungs: Clear Abdomen: Soft, appropriately tender. Incisions c/d/i. Slightly indurated area superior to incision with no overlying erythema. JP in place GU: Foley in place draining clear yellow urine  Ext: NT, No erythema  Lab Results: Recent Labs    12/11/17 1601 12/12/17 0432  HGB 13.4 12.4*  HCT 40.7 38.0*   BMET Recent Labs    12/12/17 0432  NA 133*  K 4.5  CL 99*  CO2 27  GLUCOSE 146*  BUN 17  CREATININE 1.37*  CALCIUM 8.5*     Studies/Results: No results found.  Assessment/Plan:  62 y.o. male s/p left Nephu.  Overall doing well post-op.   - Regular diet - medlock - JP cr this am  - Likely dc in afternoon.  - Will remove drain, pending study, prior to discharge   Dispo: likely today    LOS: 2 days   Alla Feeling, MD 12/13/2017, 7:34 AM

## 2017-12-13 NOTE — Discharge Summary (Signed)
[facility] Discharge Summary  Admit date: 12/11/2017  Discharge date and time: 12/13/17   Discharge to: Home  Discharge Service: Urology  Discharge Attending Physician:  Tresa Moore  Discharge  Diagnoses: <principal problem not specified>  Secondary Diagnosis: Active Problems:   Renal mass   OR Procedures: Procedure(s): XI ROBOT ASSITED LAPAROSCOPIC NEPHROURETERECTOMY 12/11/2017   Ancillary Procedures: None   Discharge Day Services: The patient was seen and examined by the Urology team both in the morning and immediately prior to discharge.  Vital signs and laboratory values were stable and within normal limits.  The physical exam was benign and unchanged and all surgical wounds were examined.  Discharge instructions were explained and all questions answered.  Subjective  No acute events overnight. Pain Controlled. No fever or chills.  Objective Patient Vitals for the past 8 hrs:  BP Temp Temp src Pulse Resp SpO2  12/13/17 0525 (!) 149/82 98.5 F (36.9 C) Oral 72 16 92 %   No intake/output data recorded.  General Appearance:        No acute distress Lungs:                       Normal work of breathing on room air Heart:                                Regular rate and rhythm Abdomen:                         Soft, non-tender, non-distended, incisions c/d/i  Extremities:                      Warm and well perfused   Hospital Course:  The patient underwent Left Nephroureterectomy with bladder cuff on 12/11/2017.  The patient tolerated the procedure well, was extubated in the OR, and afterwards was taken to the PACU for routine post-surgical care. When stable the patient was transferred to the floor.   The patient did well postoperatively.  The patient's diet was slowly advanced and at the time of discharge was tolerating a regular diet.  The patient was discharged home 2 Days Post-Op, at which point was tolerating a regular solid diet, was able to void spontaneously, have adequate  pain control with P.O. pain medication, and could ambulate without difficulty. The patient will follow up with [place] for post op check.  POD1 - tolerated clears and advanced to regular diet, pain controlled,  POD2 - ambulating, tolerating regular diet, JP cr serum, JP removed prior to [place] with foley with plans to follow up as outpatient for cystogram and removal    Condition at Discharge: Improved  Discharge Medications:  Allergies as of 12/13/2017      Reactions   Penicillins Swelling   Has patient had a PCN reaction causing immediate rash, facial/tongue/throat swelling, SOB or lightheadedness with hypotension: Yes Has patient had a PCN reaction causing severe rash involving mucus membranes or skin necrosis: Yes Has patient had a PCN reaction that required hospitalization: No Has patient had a PCN reaction occurring within the last 10 years: No If all of the above answers are "NO", then may proceed with Cephalosporin use.      Medication List    STOP taking these medications   multivitamin with minerals Tabs tablet   naproxen sodium 220 MG tablet Commonly known as:  ALEVE  TYLENOL 8 HOUR ARTHRITIS PAIN 650 MG CR tablet Generic drug:  acetaminophen     TAKE these medications   citalopram 20 MG tablet Commonly known as:  CELEXA Take 1 tablet (20 mg total) by mouth daily. What changed:  when to take this   HYDROcodone-acetaminophen 5-325 MG tablet Commonly known as:  NORCO/VICODIN Take 1-2 tablets by mouth every 4 (four) hours as needed for moderate pain. What changed:  how much to take

## 2018-02-17 ENCOUNTER — Ambulatory Visit: Payer: 59 | Admitting: Family Medicine

## 2018-02-24 ENCOUNTER — Encounter: Payer: Self-pay | Admitting: Family Medicine

## 2018-02-24 ENCOUNTER — Ambulatory Visit: Payer: 59 | Admitting: Family Medicine

## 2018-02-24 VITALS — BP 128/72 | Ht 69.0 in | Wt 160.4 lb

## 2018-02-24 DIAGNOSIS — F321 Major depressive disorder, single episode, moderate: Secondary | ICD-10-CM

## 2018-02-24 MED ORDER — CITALOPRAM HYDROBROMIDE 20 MG PO TABS: 20.0000 mg | ORAL_TABLET | Freq: Every day | ORAL | 5 refills | Status: DC

## 2018-02-24 NOTE — Progress Notes (Signed)
   Subjective:    Patient ID: Michael Brady, male    DOB: 1956-06-02, 62 y.o.   MRN: [id number]  HPI Pt here today for 3 month follow up. Pt states he had kidney removed in June. Pt states he is doing ok on the Celexa.   Flank pain overall a lot better   Back to work, in the ofice,   Patient notes ongoing compliance with antidepressant medication. No obvious side effects. Reports does not miss a dose. Overall continues to help depression substantially. No thoughts of homicide or suicide. Would like to maintain medication.  Med help s pt calm down, pts spouse encourage to stay on  exrecise golf back into it      Review of Systems No headache, no major weight loss or weight gain, no chest pain no back pain abdominal pain no change in bowel habits complete ROS otherwise negative     Objective:   Physical Exam Alert vitals stable, NAD. Blood pressure good on repeat. HEENT normal. Lungs clear. Heart regular rate and rhythm.        Assessment & Plan:  1 impression depression with element of anxiety.  Patient notes overall doing much better on medication.  Not exercising.  This is discussed.  Follow-up in 6 months for wellness plus chronic

## 2018-03-07 ENCOUNTER — Encounter: Payer: Self-pay | Admitting: Family Medicine

## 2018-06-17 DIAGNOSIS — C678 Malignant neoplasm of overlapping sites of bladder: Secondary | ICD-10-CM | POA: Diagnosis not present

## 2018-06-23 ENCOUNTER — Ambulatory Visit (HOSPITAL_COMMUNITY)
Admission: RE | Admit: 2018-06-23 | Discharge: 2018-06-23 | Disposition: A | Discharge status: Home / Self Care | Payer: 59 | Source: Ambulatory Visit | Attending: Urology | Admitting: Urology

## 2018-06-23 ENCOUNTER — Other Ambulatory Visit: Payer: Self-pay | Admitting: Urology

## 2018-06-23 DIAGNOSIS — C652 Malignant neoplasm of left renal pelvis: Secondary | ICD-10-CM | POA: Diagnosis present

## 2018-06-23 DIAGNOSIS — C651 Malignant neoplasm of right renal pelvis: Secondary | ICD-10-CM | POA: Diagnosis not present

## 2018-06-26 ENCOUNTER — Other Ambulatory Visit: Payer: Self-pay | Admitting: Urology

## 2018-06-26 DIAGNOSIS — C678 Malignant neoplasm of overlapping sites of bladder: Secondary | ICD-10-CM | POA: Diagnosis not present

## 2018-07-10 ENCOUNTER — Encounter (HOSPITAL_BASED_OUTPATIENT_CLINIC_OR_DEPARTMENT_OTHER): Payer: Self-pay | Admitting: *Deleted

## 2018-07-10 ENCOUNTER — Other Ambulatory Visit: Payer: Self-pay

## 2018-07-10 NOTE — Progress Notes (Signed)
SPOKE [place] PHONE. NPO AFTER MIDNIGHT ARRIVE 645 AM 07-25-18 [facility] NO LABS NEEDED. EKG 11-28-17 ON CHART. 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 HAS SURGERY ORDERS IN EPIC

## 2018-07-24 NOTE — Anesthesia Preprocedure Evaluation (Addendum)
Anesthesia Evaluation  Patient identified by MRN, date of birth, ID band Patient awake    Reviewed: Allergy & Precautions, NPO status , Patient's Chart, lab work & pertinent test results  History of Anesthesia Complications (+) PONV  Airway Mallampati: I  TM Distance: >3 FB Neck ROM: Full    Dental  (+) Edentulous Lower, Edentulous Upper, Dental Advisory Given   Pulmonary neg pulmonary ROS, Current Smoker,    Pulmonary exam normal breath sounds clear to auscultation       Cardiovascular negative cardio ROS Normal cardiovascular exam Rhythm:Regular Rate:Normal     Neuro/Psych PSYCHIATRIC DISORDERS Depression negative neurological ROS     GI/Hepatic negative GI ROS, Neg liver ROS,   Endo/Other  negative endocrine ROS  Renal/GU negative Renal ROS  negative genitourinary   Musculoskeletal negative musculoskeletal ROS (+)   Abdominal   Peds negative pediatric ROS (+)  Hematology negative hematology ROS (+)   Anesthesia Other Findings   Reproductive/Obstetrics negative OB ROS                           Anesthesia Physical  Anesthesia Plan  ASA: II  Anesthesia Plan: General   Post-op Pain Management:    Induction: Intravenous  PONV Risk Score and Plan: 2 and Ondansetron, Dexamethasone, Treatment may vary due to age or medical condition and Scopolamine patch - Pre-op  Airway Management Planned: LMA  Additional Equipment:   Intra-op Plan:   Post-operative Plan: Extubation in OR  Informed Consent: I have reviewed the patients History and Physical, chart, labs and discussed the procedure including the risks, benefits and alternatives for the proposed anesthesia with the patient or authorized representative who has indicated his/her understanding and acceptance.     Dental advisory given  Plan Discussed with: CRNA and Anesthesiologist  Anesthesia Plan Comments:         Anesthesia Quick Evaluation

## 2018-07-25 ENCOUNTER — Ambulatory Visit (HOSPITAL_BASED_OUTPATIENT_CLINIC_OR_DEPARTMENT_OTHER)
Admission: RE | Admit: 2018-07-25 | Discharge: 2018-07-25 | Disposition: A | Discharge status: Home / Self Care | Payer: 59 | Source: Ambulatory Visit | Attending: Urology | Admitting: Urology

## 2018-07-25 ENCOUNTER — Ambulatory Visit (HOSPITAL_BASED_OUTPATIENT_CLINIC_OR_DEPARTMENT_OTHER): Payer: 59 | Admitting: Anesthesiology

## 2018-07-25 ENCOUNTER — Other Ambulatory Visit: Payer: Self-pay

## 2018-07-25 ENCOUNTER — Encounter (HOSPITAL_BASED_OUTPATIENT_CLINIC_OR_DEPARTMENT_OTHER)
Admission: RE | Disposition: A | Discharge status: Home / Self Care | Payer: Self-pay | Source: Ambulatory Visit | Attending: Urology

## 2018-07-25 ENCOUNTER — Encounter (HOSPITAL_BASED_OUTPATIENT_CLINIC_OR_DEPARTMENT_OTHER): Payer: Self-pay | Admitting: *Deleted

## 2018-07-25 DIAGNOSIS — C679 Malignant neoplasm of bladder, unspecified: Secondary | ICD-10-CM | POA: Diagnosis not present

## 2018-07-25 DIAGNOSIS — N3090 Cystitis, unspecified without hematuria: Secondary | ICD-10-CM | POA: Insufficient documentation

## 2018-07-25 DIAGNOSIS — Z8553 Personal history of malignant neoplasm of renal pelvis: Secondary | ICD-10-CM | POA: Insufficient documentation

## 2018-07-25 DIAGNOSIS — E785 Hyperlipidemia, unspecified: Secondary | ICD-10-CM | POA: Diagnosis not present

## 2018-07-25 DIAGNOSIS — N3080 Other cystitis without hematuria: Secondary | ICD-10-CM | POA: Diagnosis not present

## 2018-07-25 DIAGNOSIS — F17210 Nicotine dependence, cigarettes, uncomplicated: Secondary | ICD-10-CM | POA: Insufficient documentation

## 2018-07-25 DIAGNOSIS — Z905 Acquired absence of kidney: Secondary | ICD-10-CM | POA: Insufficient documentation

## 2018-07-25 DIAGNOSIS — Z79899 Other long term (current) drug therapy: Secondary | ICD-10-CM | POA: Insufficient documentation

## 2018-07-25 DIAGNOSIS — C61 Malignant neoplasm of prostate: Secondary | ICD-10-CM | POA: Diagnosis not present

## 2018-07-25 DIAGNOSIS — D494 Neoplasm of unspecified behavior of bladder: Secondary | ICD-10-CM | POA: Diagnosis not present

## 2018-07-25 HISTORY — PX: TRANSURETHRAL RESECTION OF BLADDER TUMOR: SHX2575

## 2018-07-25 HISTORY — PX: CYSTOSCOPY W/ RETROGRADES: SHX1426

## 2018-07-25 SURGERY — TURBT (TRANSURETHRAL RESECTION OF BLADDER TUMOR)
Anesthesia: General | Site: Urethra | Laterality: Right

## 2018-07-25 MED ORDER — FENTANYL CITRATE (PF) 100 MCG/2ML IJ SOLN
INTRAMUSCULAR | Status: DC | PRN
  Administered 2018-07-25: 50 ug via INTRAVENOUS

## 2018-07-25 MED ORDER — CIPROFLOXACIN IN D5W 400 MG/200ML IV SOLN
400.0000 mg | INTRAVENOUS | Status: AC
  Administered 2018-07-25: 400 mg via INTRAVENOUS
  Filled 2018-07-25: qty 200

## 2018-07-25 MED ORDER — DEXAMETHASONE SODIUM PHOSPHATE 10 MG/ML IJ SOLN
INTRAMUSCULAR | Status: DC | PRN
  Administered 2018-07-25: 10 mg via INTRAVENOUS

## 2018-07-25 MED ORDER — SODIUM CHLORIDE 0.9 % IR SOLN
Status: DC | PRN
  Administered 2018-07-25: 6000 mL

## 2018-07-25 MED ORDER — LACTATED RINGERS IV SOLN
INTRAVENOUS | Status: DC
  Administered 2018-07-25 (×2): via INTRAVENOUS
  Filled 2018-07-25: qty 1000

## 2018-07-25 MED ORDER — SCOPOLAMINE 1 MG/3DAYS TD PT72
1.0000 | MEDICATED_PATCH | TRANSDERMAL | Status: DC
  Administered 2018-07-25: 1.5 mg via TRANSDERMAL
  Filled 2018-07-25: qty 1

## 2018-07-25 MED ORDER — PROMETHAZINE HCL 25 MG/ML IJ SOLN
6.2500 mg | INTRAMUSCULAR | Status: DC | PRN
  Filled 2018-07-25: qty 1

## 2018-07-25 MED ORDER — IOHEXOL 300 MG/ML  SOLN
INTRAMUSCULAR | Status: DC | PRN
  Administered 2018-07-25: 6 mL

## 2018-07-25 MED ORDER — FENTANYL CITRATE (PF) 100 MCG/2ML IJ SOLN
25.0000 ug | INTRAMUSCULAR | Status: DC | PRN
  Filled 2018-07-25: qty 1

## 2018-07-25 MED ORDER — TRAMADOL HCL 50 MG PO TABS: 50.0000 mg | ORAL_TABLET | Freq: Four times a day (QID) | ORAL | 0 refills | Status: DC | PRN

## 2018-07-25 MED ORDER — LIDOCAINE 2% (20 MG/ML) 5 ML SYRINGE
INTRAMUSCULAR | Status: DC | PRN
  Administered 2018-07-25: 80 mg via INTRAVENOUS

## 2018-07-25 MED ORDER — PROPOFOL 10 MG/ML IV BOLUS
INTRAVENOUS | Status: DC | PRN
  Administered 2018-07-25: 160 mg via INTRAVENOUS

## 2018-07-25 MED ORDER — MIDAZOLAM HCL 2 MG/2ML IJ SOLN
INTRAMUSCULAR | Status: DC | PRN
  Administered 2018-07-25: 1 mg via INTRAVENOUS

## 2018-07-25 MED ORDER — ONDANSETRON HCL 4 MG/2ML IJ SOLN
INTRAMUSCULAR | Status: DC | PRN
  Administered 2018-07-25: 4 mg via INTRAVENOUS

## 2018-07-25 MED ORDER — EPHEDRINE SULFATE-NACL 50-0.9 MG/10ML-% IV SOSY
PREFILLED_SYRINGE | INTRAVENOUS | Status: DC | PRN
  Administered 2018-07-25: 7.5 mg via INTRAVENOUS

## 2018-07-25 SURGICAL SUPPLY — 29 items
BAG DRAIN URO-CYSTO SKYTR STRL (DRAIN) ×3 IMPLANT
BAG URINE DRAINAGE (UROLOGICAL SUPPLIES) IMPLANT
BAG URINE LEG 19OZ MD ST LTX (BAG) IMPLANT
BAG URINE LEG 500ML (DRAIN) IMPLANT
CATH FOLEY 2WAY SLVR  5CC 22FR (CATHETERS)
CATH FOLEY 2WAY SLVR 30CC 20FR (CATHETERS) IMPLANT
CATH FOLEY 2WAY SLVR 5CC 22FR (CATHETERS) IMPLANT
CATH INTERMIT  6FR 70CM (CATHETERS) ×3 IMPLANT
CLOTH BEACON ORANGE TIMEOUT ST (SAFETY) ×3 IMPLANT
ELECT REM PT RETURN 9FT ADLT (ELECTROSURGICAL)
ELECTRODE REM PT RTRN 9FT ADLT (ELECTROSURGICAL) ×2 IMPLANT
EVACUATOR MICROVAS BLADDER (UROLOGICAL SUPPLIES) IMPLANT
GLOVE BIO SURGEON STRL SZ7.5 (GLOVE) ×3 IMPLANT
GLOVE INDICATOR 6.5 STRL GRN (GLOVE) ×1 IMPLANT
GOWN STRL REUS W/ TWL LRG LVL3 (GOWN DISPOSABLE) ×2 IMPLANT
GOWN STRL REUS W/TWL LRG LVL3 (GOWN DISPOSABLE) ×4 IMPLANT
GUIDEWIRE ANG ZIPWIRE 038X150 (WIRE) IMPLANT
GUIDEWIRE STR DUAL SENSOR (WIRE) ×1 IMPLANT
IV NS 1000ML (IV SOLUTION)
IV NS 1000ML BAXH (IV SOLUTION) ×2 IMPLANT
IV NS IRRIG 3000ML ARTHROMATIC (IV SOLUTION) ×4 IMPLANT
KIT TURNOVER CYSTO (KITS) ×3 IMPLANT
LOOP CUT BIPOLAR 24F LRG (ELECTROSURGICAL) ×1 IMPLANT
MANIFOLD NEPTUNE II (INSTRUMENTS) ×3 IMPLANT
NS IRRIG 500ML POUR BTL (IV SOLUTION) ×3 IMPLANT
PACK CYSTO (CUSTOM PROCEDURE TRAY) ×3 IMPLANT
SYR 10ML LL (SYRINGE) ×1 IMPLANT
SYRINGE IRR TOOMEY STRL 70CC (SYRINGE) ×1 IMPLANT
TUBE CONNECTING 12X1/4 (SUCTIONS) ×1 IMPLANT

## 2018-07-25 NOTE — Op Note (Signed)
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  PROCEDURES: 1.  Cystoscopy, right retrograde pyelogram, interpretation. 2.  Transection of bladder tumor, volume small.  ESTIMATED BLOOD LOSS:  Nil.  COMPLICATIONS:  None.  SPECIMENS: 1.  Bladder neck tumor. 2.  Prostatic urethral biopsy.  FINDINGS: 1.  Very small papillary tumor at the bladder neck less than 1 cm. 2.  Very subtle possible papillary changes to distal prostatic urethra.  This area was biopsied. 3.  Unremarkable right pyelogram. 4.  Absent right ureter and kidney.  INDICATIONS:  The patient is a very pleasant 63 year old gentleman with a history of large volume left renal pelvis cancer status post left nephroureterectomy in the past.  He has been very compliant with surveillance postoperatively.  He was found to  have a small bladder tumor at this most recent surveillance was clinically localized.  Options were discussed including recommended path of elective transurethral resection for diagnostic and staging purposes and he wished to proceed.  Informed consent  was obtained and placed in medical record,  patient still.  DESCRIPTION OF PROCEDURE:  The patient being Kejon Feild, procedure being transurethral resection of bladder tumor was confirmed.  PROCEDURE:  After informed administered, general LMA anesthesia induced.  The patient was placed into a low lithotomy position, sterile field was created prepped and draped base of the penis, perineum and proximal thighs using iodine.  Cystourethroscopy  was performed using a 22-French rigid cystoscope with offset lens.  Inspection of the anterior and posterior urethra did reveal some subtle papillary  and nodular changes to the prostatic urethra consistent with possible post-instrumentation changes  versus urothelial lesion and also very small bladder neck tumor as per prior.  No additional lesions seen within the urinary bladder.  The right ureteral orifice singleton and solitary, status post left nephroureterectomy.  The right ureteral orifice was  cannulated with a 6-French end-hole catheter and right pyelogram was obtained.  Right intravenous pyelogram shows a single right ureter, single system right kidney.  No filling defects or narrowing noted.  The cystoscope was exchanged for a 26-French resectoscope sheath with visual obturator and using a large sized resectoscope  loop, a single swipe was taken to the area of bladder neck tumor which completely removed the tumor.  This was set aside and labeled as such.  The same resectoscope loop was then used to obtain a prostatic urethral biopsy of the area of subtle nodular  and papillary changes and set aside labeled as prostatic urethra.  The base of these areas was fulgurated resulted in excellent hemostasis.  Bladder was partially empty per cystoscope.  Procedure terminated.    The patient tolerated the procedure well.  No immediate complications.  The patient was taken to postanesthesia care in stable condition.  AN/NUANCE  D:07/25/2018 T:07/25/2018 JOB:[id number]

## 2018-07-25 NOTE — Transfer of Care (Signed)
Immediate Anesthesia Transfer of Care Note  Patient: Michael Brady  Procedure(s) Performed: TRANSURETHRAL RESECTION OF BLADDER TUMOR (TURBT) (N/A Bladder) CYSTOSCOPY WITH RETROGRADE PYELOGRAM (Right Urethra)  Patient Location: PACU  Anesthesia Type:General  Level of Consciousness: sedated  Airway & Oxygen Therapy: Patient Spontanous Breathing and Patient connected to face mask oxygen  Post-op Assessment: Report given to RN and Post -op Vital signs reviewed and stable  Post vital signs: Reviewed and stable  Last Vitals:  Vitals Value Taken Time  BP 143/90 07/25/2018  9:24 AM  Temp    Pulse 56 07/25/2018  9:25 AM  Resp 17 07/25/2018  9:25 AM  SpO2 100 % 07/25/2018  9:25 AM  Vitals shown include unvalidated device data.  Last Pain:  Vitals:   07/25/18 0656  TempSrc:   PainSc: 0-No pain      Patients Stated Pain Goal: 6 (56/81/27 5170)  Complications: No apparent anesthesia complications and OA in placed with good resp effort

## 2018-07-25 NOTE — Anesthesia Procedure Notes (Signed)
Date/Time: 07/25/2018 9:15 AM Performed by: Cynda Familia, CRNA Oxygen Delivery Method: Simple face mask Placement Confirmation: positive ETCO2 and breath sounds checked- equal and bilateral Dental Injury: Teeth and Oropharynx as per pre-operative assessment

## 2018-07-25 NOTE — Discharge Instructions (Signed)
1 - You may have urinary urgency (bladder spasms) and bloody urine on / off for up to 2 weeks. This is normal.  2 - Call MD or go to ER for fever >102, severe pain / nausea / vomiting not relieved by medications, or acute change in medical status                                             Transurethral Procedure  Medications: Resume all your other meds from home  Activity: 1. No heavy lifting > 10 pounds for 2 weeks. 2. No sexual activity for 2 weeks. 3. No strenuous activity for 2 weeks. 4. No driving while on narcotic pain medications. 5. Drink plenty of water. 6. Continue to walk at home - you can still get blood clots when you are at home so keep     active but don't over do it. 7. Your urine may have some blood in it - make sure you drink plenty of water. Call or           come to the ER immediately if you catheter stops draining or you are unable to urinate.  Bathing: You can shower. You can take a bath unless you have a foley catheter in place.  Signs / Symptoms to call: 1. Call if you have a fever greater than 101.5  2. Uncontrolled nausea / vomiting, uncontrolled pain / dizziness, unable to urinate, leg         swelling / leg pain, or any other concerns.   You can reach [place] at [place] Instructions  Activity: Get plenty of rest for the remainder of the day. A responsible individual must stay with you for 24 hours following the procedure.  For the next 24 hours, DO NOT: -Drive a car -Paediatric nurse -Drink alcoholic beverages -Take any medication unless instructed by your physician -Make any legal decisions or sign important papers.  Meals: Start with liquid foods such as gelatin or soup. Progress to regular foods as tolerated. Avoid greasy, spicy, heavy foods. If nausea and/or vomiting occur, drink only clear liquids until the nausea and/or vomiting subsides. Call your physician if vomiting continues.  Special  Instructions/Symptoms: Your throat may feel dry or sore from the anesthesia or the breathing tube placed in your throat during surgery. If this causes discomfort, gargle with warm salt water. The discomfort should disappear within 24 hours.  If you had a scopolamine patch placed behind your ear for the management of post- operative nausea and/or vomiting:  1. The medication in the patch is effective for 72 hours, after which it should be removed.  Wrap patch in a tissue and discard in the trash. Wash hands thoroughly with soap and water. 2. You may remove the patch earlier than 72 hours if you experience unpleasant side effects which may include dry mouth, dizziness or visual disturbances. 3. Avoid touching the patch. Wash your hands with soap and water after contact with the patch.

## 2018-07-25 NOTE — Anesthesia Postprocedure Evaluation (Signed)
Anesthesia Post Note  Patient: Michael Brady  Procedure(s) Performed: TRANSURETHRAL RESECTION OF BLADDER TUMOR (TURBT) (N/A Bladder) CYSTOSCOPY WITH RETROGRADE PYELOGRAM (Right Urethra)     Patient location during evaluation: PACU Anesthesia Type: General Level of consciousness: sedated Pain management: pain level controlled Vital Signs Assessment: post-procedure vital signs reviewed and stable Respiratory status: spontaneous breathing and respiratory function stable Cardiovascular status: stable Postop Assessment: no apparent nausea or vomiting Anesthetic complications: no    Last Vitals:  Vitals:   07/25/18 1000 07/25/18 1040  BP: 133/88 121/78  Pulse: (!) 59 (!) 55  Resp: 17 20  Temp: 36.4 C 36.4 C  SpO2: 100% 100%    Last Pain:  Vitals:   07/25/18 1040  TempSrc:   PainSc: 0-No pain                 Carolee Channell DANIEL

## 2018-07-25 NOTE — H&P (Signed)
Michael Brady is an 63 y.o. male.    Chief Complaint: Pre-Op Transurethral Resection Small BLadder Tumor  HPI:   1 - Left Renal Pelvis Cancer / Bladder Cancer- s/p left robotic nephroureterectomy for large volume (not endoscxopically managable) TaG3 cancer with negative margins. Staing imaging and eval clincally localized.   Post-op Course:  06/2018 - CMP, CXR, CT, Cysto - 37mm left prostatic urethra papillary tumor, Cr 1.3.   2 -Solitary Rt Kidney - s/p left nephroureterectomy 12/2017. Most recetn Cr < 1.5.   PMH sig for shoulder surgery, No CV disease / blood thinners. His PCP is Michael Holms MD.   Today "Michael Brady" is seen to proceed with transurethral resection of small bladder tumor and Rt retrograde. No interval fevers. Most recent UA without infectious parameters.    Past Medical History:  Diagnosis Date  . Cancer ([place])    left renal pelvis cnacer-12/05/2017  . Diverticulosis of colon    NO ISSUES IN OVER 12 YEARS   . Dysuria   . Hematuria    10-11-17: REPORTS I "NOTICED SOME BLOOD IN MY URINE TODAY AFTER NOT SEEING ANY SINCE MY SURGERY IN [place] . I JUST HAD MY BLOOD DRAWN AT [facility] THIS LAST MONDAY BUT THEY HAVENT CALLED ME ABOUT IT YET"  . History of closed dislocation of shoulder 06/2014   LEFT --- POST IV SEDATION CLOSED REDUCTION IN ED  . PONV (postoperative nausea and vomiting)   . Renal mass, left    LEFT RENAL COLLECTING SYSTEM MASS  . Smokers' cough ([place])    OCC  . Wears dentures   . Wears glasses     Past Surgical History:  Procedure Laterality Date  . CARPAL TUNNEL RELEASE Left 01-25-2006   dr Michael Brady  [facility]   and LEFT THUMB PULLEY RELEASE  . COLONOSCOPY N/A 05/07/2017   Procedure: COLONOSCOPY;  Surgeon: Michael Signs, MD;  Location: [facility];  Service: Gastroenterology;  Laterality: N/A;  . CYSTOSCOPY WITH URETEROSCOPY AND STENT PLACEMENT Left 07/29/2017   Procedure: CYSTOSCOPY WITH URETEROSCOPY AND STENT PLACEMENT;  Surgeon: Michael Retort, MD;   Location[facility];  Service: Urology;  Laterality: Left;  . CYSTOSCOPY/URETEROSCOPY/HOLMIUM LASER/STENT PLACEMENT Left 07/15/2017   Procedure: CYSTOSCOPY, ATTEMTED URETEROSCOPY/,STENT PLACEMENT;  Surgeon: Michael Retort, MD;  Location: [facility];  Service: Urology;  Laterality: Left;  . CYSTOSCOPY/URETEROSCOPY/HOLMIUM LASER/STENT PLACEMENT Left 10/21/2017   Procedure: CYSTOSCOPY, RETROGRADE /URETEROSCOPY LEFT UPPER TRACT,BIOPSY, [name] LASER/STENT PLACEMENT;  Surgeon: Michael Retort, MD;  Location: [facility];  Service: Urology;  Laterality: Left;  . HOLMIUM LASER APPLICATION Left 5/00/9381   Procedure: HOLMIUM LASER APPLICATION;  Surgeon: Michael Retort, MD;  Location: [facility];  Service: Urology;  Laterality: Left;  . ROBOT ASSITED LAPAROSCOPIC NEPHROURETERECTOMY Left 12/11/2017   Procedure: XI ROBOT ASSITED LAPAROSCOPIC NEPHROURETERECTOMY;  Surgeon: Michael Frock, MD;  Location: [facility];  Service: Urology;  Laterality: Left;  . SHOULDER SURGERY Right 1997   ROTATOR CUFF REPAIR   . THULIUM LASER TURP (TRANSURETHRAL RESECTION OF PROSTATE) Left 07/29/2017   Procedure: CYSTOSCOPY, LEFT URETEROSCOPY WITH TUMOR BIOPSYTHULLIUM LASER ABLATION OF TUMOR AND LEFT URETERAL STENT EXCHANGE;  Surgeon: Michael Retort, MD;  Location: [facility];  Service: Urology;  Laterality: Left;  . WISDOM TOOTH EXTRACTION      Family History  Problem Relation Age of Onset  . Colon cancer Neg Hx    Social History:  reports that he has been smoking cigarettes. He  has a 60.00 pack-year smoking history. He has never used smokeless tobacco. He reports that he does not drink alcohol or use drugs.  Allergies:  Allergies  Allergen Reactions  . Penicillins Swelling    Has patient had a PCN reaction causing immediate rash, facial/tongue/throat swelling, SOB or lightheadedness with hypotension: Yes Has patient had a PCN  reaction causing severe rash involving mucus membranes or skin necrosis: Yes Has patient had a PCN reaction that required hospitalization: No Has patient had a PCN reaction occurring within the last 10 years: No If all of the above answers are "NO", then may proceed with Cephalosporin use. ARM SWELLED    Medications Prior to Admission  Medication Sig Dispense Refill  . Naproxen Sodium (ALEVE) 220 MG CAPS Take by mouth. 2 IN AM 2 IN PM    . citalopram (CELEXA) 20 MG tablet Take 1 tablet (20 mg total) by mouth at bedtime. 30 tablet 5    No results found for this or any previous visit (from the past 48 hour(s)). No results found.  Review of Systems  Constitutional: Negative.  Negative for chills and fever.  HENT: Negative.   Eyes: Negative.   Respiratory: Negative.   Cardiovascular: Negative.   Gastrointestinal: Negative.   Genitourinary: Negative for dysuria, flank pain, frequency, hematuria and urgency.  Musculoskeletal: Negative.   Skin: Negative.   Neurological: Negative.   Endo/Heme/Allergies: Negative.   Psychiatric/Behavioral: Negative.     Blood pressure (!) 144/80, pulse (!) 56, temperature 99.5 F (37.5 C), temperature source Oral, resp. rate 18, height 5\' 9"  (1.753 m), weight 73.3 kg, SpO2 99 %. Physical Exam  Constitutional: He appears well-developed.  Eyes: Pupils are equal, round, and reactive to light.  Neck: Normal range of motion.  Respiratory: Effort normal.  GI: Soft.  Prior scars w/o henrias.   Genitourinary:    Genitourinary Comments: No CVAT   Musculoskeletal: Normal range of motion.  Skin: Skin is warm.  Psychiatric: He has a normal mood and affect.     Assessment/Plan  Proceed as planned with TURBT / Rt Retrograde for diagnostic and therapeutic intent. Risks, benefits, expected peri-op course discussed previously and reiterated today.   Michael Frock, MD 07/25/2018, 7:04 AM

## 2018-07-25 NOTE — Brief Op Note (Signed)
07/25/2018  9:12 AM  PATIENT:  Michael Brady  63 y.o. male  PRE-OPERATIVE DIAGNOSIS:  SMALL BLADDER CANCER  POST-OPERATIVE DIAGNOSIS:  SMALL BLADDER CANCER  PROCEDURE:  Procedure(s): TRANSURETHRAL RESECTION OF BLADDER TUMOR (TURBT) (N/A) CYSTOSCOPY WITH RETROGRADE PYELOGRAM (Right)  SURGEON:  Surgeon(s) and Role:    * Alexis Frock, MD - Primary  PHYSICIAN ASSISTANT:   ASSISTANTS: none   ANESTHESIA:   general  EBL:  minimal   BLOOD ADMINISTERED:none  DRAINS: none   LOCAL MEDICATIONS USED:  NONE  SPECIMEN:  Source of Specimen:  1 - bladder neck tumor; 2 - prostatic urethra  DISPOSITION OF SPECIMEN:  PATHOLOGY  COUNTS:  YES  TOURNIQUET:  * No tourniquets in log *  DICTATION: .Other Dictation: Dictation Number [id number]  PLAN OF CARE: Discharge to home after PACU  PATIENT DISPOSITION:  PACU - hemodynamically stable.   Delay start of Pharmacological VTE agent (>24hrs) due to surgical blood loss or risk of bleeding: yes

## 2018-07-25 NOTE — Anesthesia Procedure Notes (Signed)
Procedure Name: LMA Insertion Date/Time: 07/25/2018 8:50 AM Performed by: Cynda Familia, CRNA Pre-anesthesia Checklist: Patient identified, Emergency Drugs available, Suction available and Patient being monitored Patient Re-evaluated:Patient Re-evaluated prior to induction Oxygen Delivery Method: Circle System Utilized Preoxygenation: Pre-oxygenation with 100% oxygen Induction Type: IV induction Ventilation: Mask ventilation without difficulty LMA: LMA inserted LMA Size: 4.0 Tube type: Oral Number of attempts: 1 Placement Confirmation: positive ETCO2 Tube secured with: Tape Dental Injury: Teeth and Oropharynx as per pre-operative assessment  Comments: IV induction Michael Brady--- LMA AM CRNA atraumatic--  Mouth as preop--20 cc air bilat BS Michael Brady

## 2018-07-28 ENCOUNTER — Encounter (HOSPITAL_BASED_OUTPATIENT_CLINIC_OR_DEPARTMENT_OTHER): Payer: Self-pay | Admitting: Urology

## 2018-08-04 ENCOUNTER — Ambulatory Visit: Payer: 59 | Admitting: Family Medicine

## 2018-08-04 ENCOUNTER — Encounter: Payer: Self-pay | Admitting: Family Medicine

## 2018-08-04 VITALS — BP 122/84 | Temp 98.4°F | Wt 165.2 lb

## 2018-08-04 DIAGNOSIS — J4520 Mild intermittent asthma, uncomplicated: Secondary | ICD-10-CM | POA: Diagnosis not present

## 2018-08-04 DIAGNOSIS — J329 Chronic sinusitis, unspecified: Secondary | ICD-10-CM | POA: Diagnosis not present

## 2018-08-04 DIAGNOSIS — J310 Chronic rhinitis: Secondary | ICD-10-CM

## 2018-08-04 MED ORDER — DOXYCYCLINE HYCLATE 100 MG PO TABS: 100.0000 mg | ORAL_TABLET | Freq: Two times a day (BID) | ORAL | 0 refills | Status: DC

## 2018-08-04 MED ORDER — ALBUTEROL SULFATE HFA 108 (90 BASE) MCG/ACT IN AERS: 2.0000 | INHALATION_SPRAY | Freq: Four times a day (QID) | RESPIRATORY_TRACT | 2 refills | Status: DC | PRN

## 2018-08-04 NOTE — Progress Notes (Signed)
   Subjective:    Patient ID: Michael Brady, male    DOB: 1955-08-05, 63 y.o.   MRN: [id number]  Cough  This is a new problem. The current episode started in the past 7 days. Associated symptoms include a fever, headaches, nasal congestion and wheezing. He has tried OTC cough suppressant for the symptoms.   Hit fairly hard in the frontin  Came hom  Cold headache  bodycaches  No fever   Sleeping all the time   Appetite    h a better  Cough worse in the day goes on   Review of Systems  Constitutional: Positive for fever.  Respiratory: Positive for cough and wheezing.   Neurological: Positive for headaches.       Objective:   Physical Exam  Alert, mild malaise. Hydration good Vitals stable. frontal/ maxillary tenderness evident positive nasal congestion. pharynx normal neck supple  lungs clear/no crackles however positive wheezes. heart regular in rhythm       Assessment & Plan:  Impression rhinosinusitis likely post viral, almost certainly status post flu discussed with patient. plan antibiotics prescribed. Questions answered. Symptomatic care discussed. warning signs discussed. WSL Albuterol 2 sprays 4 times daily as needed for wheezing.  Encouraged to stop smoking.

## 2018-08-11 DIAGNOSIS — Z905 Acquired absence of kidney: Secondary | ICD-10-CM | POA: Diagnosis not present

## 2018-08-11 DIAGNOSIS — C678 Malignant neoplasm of overlapping sites of bladder: Secondary | ICD-10-CM | POA: Diagnosis not present

## 2018-08-11 DIAGNOSIS — C652 Malignant neoplasm of left renal pelvis: Secondary | ICD-10-CM | POA: Diagnosis not present

## 2018-08-27 ENCOUNTER — Ambulatory Visit: Payer: 59 | Admitting: Family Medicine

## 2018-08-27 ENCOUNTER — Encounter: Payer: Self-pay | Admitting: Family Medicine

## 2018-08-27 VITALS — BP 132/80 | Ht 69.0 in | Wt 165.0 lb

## 2018-08-27 DIAGNOSIS — Z23 Encounter for immunization: Secondary | ICD-10-CM | POA: Diagnosis not present

## 2018-08-27 DIAGNOSIS — Z79899 Other long term (current) drug therapy: Secondary | ICD-10-CM | POA: Diagnosis not present

## 2018-08-27 DIAGNOSIS — Z125 Encounter for screening for malignant neoplasm of prostate: Secondary | ICD-10-CM | POA: Diagnosis not present

## 2018-08-27 DIAGNOSIS — E785 Hyperlipidemia, unspecified: Secondary | ICD-10-CM

## 2018-08-27 DIAGNOSIS — F321 Major depressive disorder, single episode, moderate: Secondary | ICD-10-CM | POA: Diagnosis not present

## 2018-08-27 DIAGNOSIS — Z0000 Encounter for general adult medical examination without abnormal findings: Secondary | ICD-10-CM | POA: Diagnosis not present

## 2018-08-27 MED ORDER — CITALOPRAM HYDROBROMIDE 20 MG PO TABS: 20.0000 mg | ORAL_TABLET | Freq: Every day | ORAL | 5 refills | Status: DC

## 2018-08-27 NOTE — Progress Notes (Signed)
   Subjective:    Patient ID: Michael Brady, male    DOB: 1956/04/11, 63 y.o.   MRN: [id number]  HPI Patient is here today for chronic health issues and wellness.  Hyperlipidemia:Diet controlled  Depression:Celexa 20 mg Qhs  Patient notes ongoing compliance with antidepressant medication. No obvious side effects. Reports does not miss a dose. Overall continues to help depression substantially. No thoughts of homicide or suicide. Would like to maintain medication.  Pt stats overall has helped     Still coghin fof and on with status post the flu   The patient comes in today for a wellness visit.  A review of their health history was completed.  A review of medications was also completed.  Any needed refills; No  Eating habits: Good  Falls/  MVA accidents in past few months: No  Regular exercise: Yes  Specialist pt sees on regular basis: Urologist at [facility].  Preventative health issues were discussed.   Additional concerns: None    Review of Systems  Constitutional: Negative for activity change, appetite change and fever.  HENT: Negative for congestion and rhinorrhea.   Eyes: Negative for discharge.  Respiratory: Negative for cough and wheezing.   Cardiovascular: Negative for chest pain.  Gastrointestinal: Negative for abdominal pain, blood in stool and vomiting.  Genitourinary: Negative for difficulty urinating and frequency.  Musculoskeletal: Negative for neck pain.  Skin: Negative for rash.  Allergic/Immunologic: Negative for environmental allergies and food allergies.  Neurological: Negative for weakness and headaches.  Psychiatric/Behavioral: Negative for agitation.  All other systems reviewed and are negative.      Objective:   Physical Exam Vitals signs reviewed.  Constitutional:      Appearance: He is well-developed.  HENT:     Head: Normocephalic and atraumatic.     Right Ear: External ear normal.     Left Ear: External ear normal.   Nose: Nose normal.  Eyes:     Pupils: Pupils are equal, round, and reactive to light.  Neck:     Musculoskeletal: Normal range of motion and neck supple.     Thyroid: No thyromegaly.  Cardiovascular:     Rate and Rhythm: Normal rate and regular rhythm.     Heart sounds: Normal heart sounds. No murmur.  Pulmonary:     Effort: Pulmonary effort is normal. No respiratory distress.     Breath sounds: Normal breath sounds. No wheezing.  Abdominal:     General: Bowel sounds are normal. There is no distension.     Palpations: Abdomen is soft. There is no mass.     Tenderness: There is no abdominal tenderness.  Genitourinary:    Penis: Normal.      Prostate: Normal.  Musculoskeletal: Normal range of motion.  Lymphadenopathy:     Cervical: No cervical adenopathy.  Skin:    General: Skin is warm and dry.     Findings: No erythema.  Neurological:     Mental Status: He is alert.     Motor: No abnormal muscle tone.  Psychiatric:        Behavior: Behavior normal.        Judgment: Judgment normal.           Assessment & Plan:  Impression wellness exam.  Colonoscopy discussed.  Next colon due in 2028.  Diet discussed.  Exercise discussed vaccines discussed and administered  2.  Depression clinically stable.  Overall improved mood.  Handling medications well.  Patient to maintain same rationale discussed

## 2018-09-02 DIAGNOSIS — E785 Hyperlipidemia, unspecified: Secondary | ICD-10-CM | POA: Diagnosis not present

## 2018-09-02 DIAGNOSIS — Z79899 Other long term (current) drug therapy: Secondary | ICD-10-CM | POA: Diagnosis not present

## 2018-09-02 DIAGNOSIS — Z125 Encounter for screening for malignant neoplasm of prostate: Secondary | ICD-10-CM | POA: Diagnosis not present

## 2018-09-03 LAB — HEPATIC FUNCTION PANEL
ALT: 17 IU/L (ref 0–44)
AST: 25 IU/L (ref 0–40)
Albumin: 4.4 g/dL (ref 3.8–4.9)
Alkaline Phosphatase: 84 IU/L (ref 39–117)
Bilirubin Total: 0.3 mg/dL (ref 0.0–1.2)
Bilirubin, Direct: 0.09 mg/dL (ref 0.00–0.40)
Total Protein: 6.9 g/dL (ref 6.0–8.5)

## 2018-09-03 LAB — LIPID PANEL
CHOL/HDL RATIO: 4.6 ratio (ref 0.0–5.0)
Cholesterol, Total: 215 mg/dL — ABNORMAL HIGH (ref 100–199)
HDL: 47 mg/dL (ref >39)
LDL CALC: 142 mg/dL — AB (ref 0–99)
Triglycerides: 132 mg/dL (ref 0–149)
VLDL Cholesterol Cal: 26 mg/dL (ref 5–40)

## 2018-09-03 LAB — BASIC METABOLIC PANEL
BUN/Creatinine Ratio: 13 (ref 10–24)
BUN: 22 mg/dL (ref 8–27)
CALCIUM: 9.7 mg/dL (ref 8.6–10.2)
CHLORIDE: 101 mmol/L (ref 96–106)
CO2: 23 mmol/L (ref 20–29)
Creatinine, Ser: 1.64 mg/dL — ABNORMAL HIGH (ref 0.76–1.27)
GFR calc Af Amer: 52 mL/min/{1.73_m2} — ABNORMAL LOW (ref >59)
GFR calc non Af Amer: 45 mL/min/{1.73_m2} — ABNORMAL LOW (ref >59)
Glucose: 88 mg/dL (ref 65–99)
Potassium: 5.5 mmol/L — ABNORMAL HIGH (ref 3.5–5.2)
Sodium: 137 mmol/L (ref 134–144)

## 2018-09-03 LAB — PSA: Prostate Specific Ag, Serum: 1.2 ng/mL (ref 0.0–4.0)

## 2018-09-23 ENCOUNTER — Ambulatory Visit: Payer: 59 | Admitting: Family Medicine

## 2018-09-30 ENCOUNTER — Ambulatory Visit: Payer: 59 | Admitting: Family Medicine

## 2018-09-30 ENCOUNTER — Other Ambulatory Visit: Payer: Self-pay

## 2018-09-30 VITALS — BP 122/82 | Ht 69.0 in | Wt 166.2 lb

## 2018-09-30 DIAGNOSIS — N183 Chronic kidney disease, stage 3 unspecified: Secondary | ICD-10-CM

## 2018-09-30 DIAGNOSIS — N289 Disorder of kidney and ureter, unspecified: Secondary | ICD-10-CM | POA: Diagnosis not present

## 2018-09-30 DIAGNOSIS — N189 Chronic kidney disease, unspecified: Secondary | ICD-10-CM | POA: Insufficient documentation

## 2018-09-30 NOTE — Progress Notes (Addendum)
   Subjective:    Patient ID: Michael Brady, male    DOB: 03-23-1956, 63 y.o.   MRN: [id number]  HPI  Patient arrives to discuss results of recent lab work. No problems or concerns.  Results for orders placed or performed in visit on 08/27/18  Lipid panel  Result Value Ref Range   Cholesterol, Total 215 (H) 100 - 199 mg/dL   Triglycerides 132 0 - 149 mg/dL   HDL 47 >39 mg/dL   VLDL Cholesterol Cal 26 5 - 40 mg/dL   LDL Calculated 142 (H) 0 - 99 mg/dL   Chol/HDL Ratio 4.6 0.0 - 5.0 ratio  Hepatic function panel  Result Value Ref Range   Total Protein 6.9 6.0 - 8.5 g/dL   Albumin 4.4 3.8 - 4.9 g/dL   Bilirubin Total 0.3 0.0 - 1.2 mg/dL   Bilirubin, Direct 0.09 0.00 - 0.40 mg/dL   Alkaline Phosphatase 84 39 - 117 IU/L   AST 25 0 - 40 IU/L   ALT 17 0 - 44 IU/L  Basic metabolic panel  Result Value Ref Range   Glucose 88 65 - 99 mg/dL   BUN 22 8 - 27 mg/dL   Creatinine, Ser 1.64 (H) 0.76 - 1.27 mg/dL   GFR calc non Af Amer 45 (L) >59 mL/min/1.73   GFR calc Af Amer 52 (L) >59 mL/min/1.73   BUN/Creatinine Ratio 13 10 - 24   Sodium 137 134 - 144 mmol/L   Potassium 5.5 (H) 3.5 - 5.2 mmol/L   Chloride 101 96 - 106 mmol/L   CO2 23 20 - 29 mmol/L   Calcium 9.7 8.6 - 10.2 mg/dL  PSA  Result Value Ref Range   Prostate Specific Ag, Serum 1.2 0.0 - 4.0 ng/mL     Review of Systems No headache, no major weight loss or weight gain, no chest pain no back pain abdominal pain no change in bowel habits complete ROS otherwise negative     Objective:   Physical Exam   Alert vitals stable, NAD. Blood pressure good on repeat. HEENT normal. Lungs clear. Heart regular rate and rhythm.      Assessment & Plan:  Impression chronic renal insufficiency.  Worsening.  Discussed at length.  Patient takes 2 Aleve twice per day.  Needs to stop this.  Start Tylenol 2 tablets twice daily.  Liver enzymes per day.  Long-term implications discussed.  Multiple questions answered.  Recheck met 7 in a couple  months  Greater than 50% of this 25 minute face to face visit was spent in counseling and discussion and coordination of care regarding the above diagnosis/diagnosies

## 2018-12-08 ENCOUNTER — Ambulatory Visit (HOSPITAL_COMMUNITY)
Admission: RE | Admit: 2018-12-08 | Discharge: 2018-12-08 | Disposition: A | Discharge status: Home / Self Care | Payer: 59 | Source: Ambulatory Visit | Attending: Urology | Admitting: Urology

## 2018-12-08 ENCOUNTER — Other Ambulatory Visit (HOSPITAL_COMMUNITY): Payer: Self-pay | Admitting: Urology

## 2018-12-08 ENCOUNTER — Other Ambulatory Visit: Payer: Self-pay

## 2018-12-08 DIAGNOSIS — C652 Malignant neoplasm of left renal pelvis: Secondary | ICD-10-CM | POA: Diagnosis present

## 2019-02-19 ENCOUNTER — Other Ambulatory Visit: Payer: Self-pay | Admitting: Family Medicine

## 2019-02-24 ENCOUNTER — Other Ambulatory Visit: Payer: Self-pay

## 2019-02-24 ENCOUNTER — Ambulatory Visit (INDEPENDENT_AMBULATORY_CARE_PROVIDER_SITE_OTHER): Payer: 59 | Admitting: Family Medicine

## 2019-02-24 DIAGNOSIS — F321 Major depressive disorder, single episode, moderate: Secondary | ICD-10-CM | POA: Diagnosis not present

## 2019-02-24 MED ORDER — CITALOPRAM HYDROBROMIDE 20 MG PO TABS: ORAL_TABLET | ORAL | 5 refills | Status: DC

## 2019-02-24 NOTE — Progress Notes (Signed)
   Subjective:  Audio plus video  Patient ID: Michael Brady, male    DOB: 31-Aug-1955, 63 y.o.   MRN: [id number]  HPI  Patient calls for a follow up on Celexa. Patient states he is doing well with no problems or concerns and just refills of medication.  Virtual Visit via Video Note  I connected with [name] on 02/24/19 at  9:30 AM EDT by a video enabled telemedicine application and verified that I am speaking with the correct person using two identifiers.  Location: Patient: home Provider: office   I discussed the limitations of evaluation and management by telemedicine and the availability of in person appointments. The patient expressed understanding and agreed to proceed.  History of Present Illness:    Observations/Objective:   Assessment and Plan:   Follow Up Instructions:    I discussed the assessment and treatment plan with the patient. The patient was provided an opportunity to ask questions and all were answered. The patient agreed with the plan and demonstrated an understanding of the instructions.   The patient was advised to call back or seek an in-person evaluation if the symptoms worsen or if the condition fails to improve as anticipated.  I provided 20 minutes of non-face-to-face time during this encounter.  Compliant with the medication.  Reports overall helping his mood still.  Would like to stay on it.  Exercising some.    Review of Systems No headache, no major weight loss or weight gain, no chest pain no back pain abdominal pain no change in bowel habits complete ROS otherwise negative     Objective:   Physical Exam Virtual       Assessment & Plan:  Impression depression with element of anxiety and at times irritability.  Discussed.  Maintain same meds.  Diet exercise discussed compliance discussed  Follow-up in 6 months for wellness plus chronic

## 2019-02-25 ENCOUNTER — Ambulatory Visit: Payer: 59 | Admitting: Family Medicine

## 2019-02-28 ENCOUNTER — Encounter: Payer: Self-pay | Admitting: Family Medicine

## 2019-06-12 ENCOUNTER — Other Ambulatory Visit (HOSPITAL_COMMUNITY): Payer: Self-pay | Admitting: Urology

## 2019-06-12 ENCOUNTER — Ambulatory Visit (HOSPITAL_COMMUNITY)
Admission: RE | Admit: 2019-06-12 | Discharge: 2019-06-12 | Disposition: A | Discharge status: Home / Self Care | Payer: 59 | Source: Ambulatory Visit | Attending: Urology | Admitting: Urology

## 2019-06-12 ENCOUNTER — Other Ambulatory Visit: Payer: Self-pay

## 2019-06-12 DIAGNOSIS — C652 Malignant neoplasm of left renal pelvis: Secondary | ICD-10-CM

## 2019-06-19 ENCOUNTER — Other Ambulatory Visit: Payer: Self-pay

## 2019-06-19 ENCOUNTER — Ambulatory Visit (INDEPENDENT_AMBULATORY_CARE_PROVIDER_SITE_OTHER): Payer: 59 | Admitting: Family Medicine

## 2019-06-19 DIAGNOSIS — F321 Major depressive disorder, single episode, moderate: Secondary | ICD-10-CM | POA: Diagnosis not present

## 2019-06-19 MED ORDER — CITALOPRAM HYDROBROMIDE 20 MG PO TABS: ORAL_TABLET | ORAL | 5 refills | Status: DC

## 2019-06-19 NOTE — Progress Notes (Signed)
   Subjective:  Audio only  Patient ID: Michael Brady, male    DOB: [birth year], 63 y.o.   MRN: [id number]  Depression        This is a chronic problem.  Treatments tried: Celexa 20 mg at bedtime.  Compliance with treatment is good.  Pt states he is doing well, no issues.  (Pt did see [facility] last week and had lab work done and a chest x-ray.)  Virtual Visit via Telephone Note  I connected with Michael Brady on 06/19/19 at  8:30 AM EST by telephone and verified that I am speaking with the correct person using two identifiers.  Location: Patient: home Provider: office   I discussed the limitations, risks, security and privacy concerns of performing an evaluation and management service by telephone and the availability of in person appointments. I also discussed with the patient that there may be a patient responsible charge related to this service. The patient expressed understanding and agreed to proceed.   History of Present Illness:    Observations/Objective:   Assessment and Plan:   Follow Up Instructions:    I discussed the assessment and treatment plan with the patient. The patient was provided an opportunity to ask questions and all were answered. The patient agreed with the plan and demonstrated an understanding of the instructions.   The patient was advised to call back or seek an in-person evaluation if the symptoms worsen or if the condition fails to improve as anticipated.  I provided 18 minutes of non-face-to-face time during this encounter.   Vicente Males, LPN  Patient notes ongoing compliance with antidepressant medication. No obvious side effects. Reports does not miss a dose. Overall continues to help depression substantially. No thoughts of homicide or suicide. Would like to maintain medication.   Review of Systems  Psychiatric/Behavioral: Positive for depression.       Objective:   Physical Exam   Virtual     Assessment & Plan:   Impression depression clinically stable.  Discussed.  Meds initiated and have helps definitely.  Patient wishes to stay on.  I think this is appropriate exercise encouraged compliance discussed

## 2019-06-19 NOTE — Progress Notes (Signed)
   Subjective:    Patient ID: Michael Brady, male    DOB: 1955-10-27, 63 y.o.   MRN: [id number]  HPI    Review of Systems     Objective:   Physical Exam        Assessment & Plan:

## 2019-06-21 ENCOUNTER — Encounter: Payer: Self-pay | Admitting: Family Medicine

## 2019-08-13 ENCOUNTER — Encounter: Payer: Self-pay | Admitting: Family Medicine

## 2019-10-08 DIAGNOSIS — Z029 Encounter for administrative examinations, unspecified: Secondary | ICD-10-CM

## 2019-10-11 ENCOUNTER — Other Ambulatory Visit: Payer: Self-pay | Admitting: Family Medicine

## 2019-10-12 NOTE — Telephone Encounter (Signed)
6 mo worth  

## 2020-03-15 ENCOUNTER — Encounter (HOSPITAL_COMMUNITY): Payer: Self-pay

## 2020-03-15 ENCOUNTER — Other Ambulatory Visit: Payer: Self-pay

## 2020-03-15 ENCOUNTER — Emergency Department (HOSPITAL_COMMUNITY): Payer: 59

## 2020-03-15 ENCOUNTER — Emergency Department (HOSPITAL_COMMUNITY)
Admission: EM | Admit: 2020-03-15 | Discharge: 2020-03-15 | Disposition: A | Discharge status: Home / Self Care | Payer: 59 | Attending: Emergency Medicine | Admitting: Emergency Medicine

## 2020-03-15 DIAGNOSIS — Z8541 Personal history of malignant neoplasm of cervix uteri: Secondary | ICD-10-CM | POA: Insufficient documentation

## 2020-03-15 DIAGNOSIS — Z8553 Personal history of malignant neoplasm of renal pelvis: Secondary | ICD-10-CM | POA: Diagnosis not present

## 2020-03-15 DIAGNOSIS — R05 Cough: Secondary | ICD-10-CM | POA: Diagnosis present

## 2020-03-15 DIAGNOSIS — R000 Tachycardia, unspecified: Secondary | ICD-10-CM | POA: Diagnosis not present

## 2020-03-15 DIAGNOSIS — U071 COVID-19: Secondary | ICD-10-CM | POA: Diagnosis not present

## 2020-03-15 DIAGNOSIS — F17210 Nicotine dependence, cigarettes, uncomplicated: Secondary | ICD-10-CM | POA: Diagnosis not present

## 2020-03-15 DIAGNOSIS — Z955 Presence of coronary angioplasty implant and graft: Secondary | ICD-10-CM | POA: Diagnosis not present

## 2020-03-15 LAB — CBC WITH DIFFERENTIAL/PLATELET
Abs Immature Granulocytes: 0.07 10*3/uL (ref 0.00–0.07)
Basophils Absolute: 0 10*3/uL (ref 0.0–0.1)
Basophils Relative: 0 %
Eosinophils Absolute: 0 10*3/uL (ref 0.0–0.5)
Eosinophils Relative: 0 %
HCT: 44.2 % (ref 39.0–52.0)
Hemoglobin: 14.2 g/dL (ref 13.0–17.0)
Immature Granulocytes: 1 %
Lymphocytes Relative: 5 %
Lymphs Abs: 0.5 10*3/uL — ABNORMAL LOW (ref 0.7–4.0)
MCH: 30.5 pg (ref 26.0–34.0)
MCHC: 32.1 g/dL (ref 30.0–36.0)
MCV: 94.8 fL (ref 80.0–100.0)
Monocytes Absolute: 0.9 10*3/uL (ref 0.1–1.0)
Monocytes Relative: 8 %
Neutro Abs: 10.1 10*3/uL — ABNORMAL HIGH (ref 1.7–7.7)
Neutrophils Relative %: 86 %
Platelets: 245 10*3/uL (ref 150–400)
RBC: 4.66 MIL/uL (ref 4.22–5.81)
RDW: 13.2 % (ref 11.5–15.5)
WBC: 11.6 10*3/uL — ABNORMAL HIGH (ref 4.0–10.5)
nRBC: 0 % (ref 0.0–0.2)

## 2020-03-15 LAB — BASIC METABOLIC PANEL
Anion gap: 14 (ref 5–15)
BUN: 28 mg/dL — ABNORMAL HIGH (ref 8–23)
CO2: 23 mmol/L (ref 22–32)
Calcium: 8.9 mg/dL (ref 8.9–10.3)
Chloride: 101 mmol/L (ref 98–111)
Creatinine, Ser: 1.33 mg/dL — ABNORMAL HIGH (ref 0.61–1.24)
GFR calc Af Amer: >60 mL/min (ref >60)
GFR calc non Af Amer: 57 mL/min — ABNORMAL LOW (ref >60)
Glucose, Bld: 133 mg/dL — ABNORMAL HIGH (ref 70–99)
Potassium: 4.2 mmol/L (ref 3.5–5.1)
Sodium: 138 mmol/L (ref 135–145)

## 2020-03-15 LAB — SARS CORONAVIRUS 2 BY RT PCR (HOSPITAL ORDER, PERFORMED IN ~~LOC~~ HOSPITAL LAB): SARS Coronavirus 2: POSITIVE — AB

## 2020-03-15 MED ORDER — ONDANSETRON HCL 4 MG/2ML IJ SOLN
4.0000 mg | Freq: Once | INTRAMUSCULAR | Status: AC
  Administered 2020-03-15: 4 mg via INTRAVENOUS
  Filled 2020-03-15: qty 2

## 2020-03-15 MED ORDER — ALBUTEROL SULFATE HFA 108 (90 BASE) MCG/ACT IN AERS
2.0000 | INHALATION_SPRAY | Freq: Once | RESPIRATORY_TRACT | Status: AC
  Administered 2020-03-15: 2 via RESPIRATORY_TRACT
  Filled 2020-03-15: qty 6.7

## 2020-03-15 MED ORDER — IPRATROPIUM BROMIDE HFA 17 MCG/ACT IN AERS: 2.0000 | INHALATION_SPRAY | Freq: Once | RESPIRATORY_TRACT | Status: DC

## 2020-03-15 MED ORDER — ONDANSETRON 4 MG PO TBDP: 4.0000 mg | ORAL_TABLET | Freq: Three times a day (TID) | ORAL | 0 refills | Status: DC | PRN

## 2020-03-15 MED ORDER — BENZONATATE 100 MG PO CAPS: 100.0000 mg | ORAL_CAPSULE | Freq: Three times a day (TID) | ORAL | 0 refills | Status: DC

## 2020-03-15 MED ORDER — SODIUM CHLORIDE 0.9 % IV BOLUS
500.0000 mL | Freq: Once | INTRAVENOUS | Status: AC
  Administered 2020-03-15: 500 mL via INTRAVENOUS

## 2020-03-15 NOTE — Discharge Instructions (Signed)
You have tested POSITIVE for COVID 19 today. Please stay home and self isolate for 14 days starting today (cleared: 03/30/20).   I have prescribed cough medication as well as nausea medication. Take as prescribed.   Drink plenty of fluids to stay hydrated.   Follow up with your PCP regarding your ED visit today.   Return to the ED for any worsening symptoms

## 2020-03-15 NOTE — ED Triage Notes (Signed)
Pt presents to ED with productive cough started Thursday and decreased appetite. Pt denies fever.

## 2020-03-15 NOTE — ED Provider Notes (Signed)
[facility] EMERGENCY DEPARTMENT Provider Note   CSN: [id number] Arrival date & time: 03/15/20  1028     History Chief Complaint  Patient presents with  . Cough    Michael Brady is a 64 y.o. male who presents to the ED today with complaint of a "chest cold" for the past 1.5 weeks. Pt states he has a productive cough, mild SOB, decreased appetite, nausea, NBNB emesis, and diarrhea. He is not vaccinated against COVID 19. He has been taking Dayquil and Nyquil without relief. He states he mostly came in today due to not being able to eat or drink anything without dry heaving. Pt denies any recent sick contacts. He has not been taking his temperature at home however states he has had chills intermittently. Denies chest pain, abdominal pain, blood in emesis/stool, urinary sx, or any other associated symptoms.   The history is provided by the patient and medical records.       Past Medical History:  Diagnosis Date  . 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 Diverticulosis of colon    NO ISSUES IN OVER 12 YEARS   . Dysuria   . Hematuria    10-11-17: REPORTS I "NOTICED SOME BLOOD IN MY URINE TODAY AFTER NOT SEEING ANY SINCE MY SURGERY IN [place] . I JUST HAD MY BLOOD DRAWN AT [facility] THIS LAST MONDAY BUT THEY HAVENT CALLED ME ABOUT IT YET"  . History of closed dislocation of shoulder 06/2014   LEFT --- POST IV SEDATION CLOSED REDUCTION IN ED  . PONV (postoperative nausea and vomiting)   . Renal mass, left    LEFT RENAL COLLECTING SYSTEM MASS  . Smokers' cough ([place])    OCC  . Wears dentures   . Wears glasses     Patient Active Problem List   Diagnosis Date Noted  . Chronic renal insufficiency 09/30/2018  . Renal mass 12/11/2017  . Depression, major, single episode, moderate ([place]) 11/21/2017  . Special screening for malignant neoplasms, colon   . Diverticulosis of large intestine without diverticulitis   . Residual hemorrhoidal skin tags   . Hyperlipidemia LDL goal  <130 04/03/2017  . Anterior shoulder dislocation     Past Surgical History:  Procedure Laterality Date  . CARPAL TUNNEL RELEASE Left 01-25-2006   dr Lorin Mercy  [facility]   and LEFT THUMB PULLEY RELEASE  . COLONOSCOPY N/A 05/07/2017   Procedure: COLONOSCOPY;  Surgeon: Aviva Signs, MD;  [facility];  Service: Gastroenterology;  Laterality: N/A;  . CYSTOSCOPY W/ RETROGRADES Right 07/25/2018   Procedure: CYSTOSCOPY WITH RETROGRADE PYELOGRAM;  Surgeon: Alexis Frock, MD;  Location[facility];  Service: Urology;  Laterality: Right;  . CYSTOSCOPY WITH URETEROSCOPY AND STENT PLACEMENT Left 07/29/2017   Procedure: CYSTOSCOPY WITH URETEROSCOPY AND STENT PLACEMENT;  Surgeon: [name], MD;  Location: [facility];  Service: Urology;  Laterality: Left;  . CYSTOSCOPY/URETEROSCOPY/HOLMIUM LASER/STENT PLACEMENT Left 07/15/2017   Procedure: CYSTOSCOPY, ATTEMTED URETEROSCOPY/,STENT PLACEMENT;  Surgeon: [name], MD;  Location: [facility];  Service: Urology;  Laterality: Left;  . CYSTOSCOPY/URETEROSCOPY/HOLMIUM LASER/STENT PLACEMENT Left 10/21/2017   Procedure: CYSTOSCOPY, RETROGRADE /URETEROSCOPY LEFT UPPER TRACT,BIOPSY, [name] LASER/STENT PLACEMENT;  Surgeon: [name], MD;  Location: [facility];  Service: Urology;  Laterality: Left;  . HOLMIUM LASER APPLICATION Left 3/81/8299   Procedure: HOLMIUM LASER APPLICATION;  Surgeon: [name], MD;  Location: [facility];  Service: Urology;  Laterality:  Left;  . ROBOT ASSITED LAPAROSCOPIC NEPHROURETERECTOMY Left 12/11/2017   Procedure: XI ROBOT ASSITED LAPAROSCOPIC NEPHROURETERECTOMY;  Surgeon: Alexis Frock, MD;  Location: [facility];  Service: Urology;  Laterality: Left;  . SHOULDER SURGERY Right 1997   ROTATOR CUFF REPAIR   . THULIUM LASER TURP (TRANSURETHRAL RESECTION OF PROSTATE) Left 07/29/2017   Procedure: CYSTOSCOPY, LEFT URETEROSCOPY WITH  TUMOR BIOPSYTHULLIUM LASER ABLATION OF TUMOR AND LEFT URETERAL STENT EXCHANGE;  Surgeon: [name], MD;  Location: [facility];  Service: Urology;  Laterality: Left;  . TRANSURETHRAL RESECTION OF BLADDER TUMOR N/A 07/25/2018   Procedure: TRANSURETHRAL RESECTION OF BLADDER TUMOR (TURBT);  Surgeon: Alexis Frock, MD;  Location: [facility];  Service: Urology;  Laterality: N/A;  . WISDOM TOOTH EXTRACTION         Family History  Problem Relation Age of Onset  . Colon cancer Neg Hx     Social History   Tobacco Use  . Smoking status: Current Every Day Smoker    Packs/day: 1.50    Years: 40.00    Pack years: 60.00    Types: Cigarettes  . Smokeless tobacco: Never Used  Vaping Use  . Vaping Use: Never used  Substance Use Topics  . Alcohol use: No  . Drug use: No    Home Medications Prior to Admission medications   Medication Sig Start Date End Date Taking? Authorizing Provider  acetaminophen (TYLENOL) 325 MG tablet Take 650 mg by mouth every 6 (six) hours as needed.   Yes [provider]  citalopram (CELEXA) 20 MG tablet TAKE 1 TABLET(20 MG) BY MOUTH AT BEDTIME 10/12/19  Yes Mikey Kirschner, MD  Multiple Vitamins-Minerals (MULTIVITAMIN WITH MINERALS) tablet Take 1 tablet by mouth daily.   Yes [provider]  benzonatate (TESSALON) 100 MG capsule Take 1 capsule (100 mg total) by mouth every 8 (eight) hours. 03/15/20   Alroy Bailiff, Veda Arrellano, PA-C  Naproxen Sodium (ALEVE) 220 MG CAPS Take by mouth. 2 IN AM 2 IN PM Patient not taking: Reported on 03/15/2020    [provider]  ondansetron (ZOFRAN ODT) 4 MG disintegrating tablet Take 1 tablet (4 mg total) by mouth every 8 (eight) hours as needed for nausea or vomiting. 03/15/20   Alroy Bailiff, Nirvaan Frett, PA-C  traMADol (ULTRAM) 50 MG tablet Take 1-2 tablets (50-100 mg total) by mouth every 6 (six) hours as needed for moderate pain or severe pain. Post-operatively Patient not taking: Reported  on 08/27/2018 07/25/18   Alexis Frock, MD    Allergies    Penicillins  Review of Systems   Review of Systems  Constitutional: Positive for chills and fatigue.  Respiratory: Positive for cough and shortness of breath.   Cardiovascular: Negative for chest pain.  Gastrointestinal: Positive for diarrhea, nausea and vomiting. Negative for abdominal pain.  All other systems reviewed and are negative.   Physical Exam Updated Vital Signs BP (!) 148/100 (BP Location: Right Arm)   Pulse (!) 102   Temp 98.9 F (37.2 C) (Oral)   Resp 20   Ht 5\' 9"  (1.753 m)   Wt 77.1 kg   SpO2 93%   BMI 25.10 kg/m   Physical Exam Vitals and nursing note reviewed.  Constitutional:      Appearance: He is ill-appearing. He is not diaphoretic.  HENT:     Head: Normocephalic and atraumatic.     Mouth/Throat:     Mouth: Mucous membranes are dry.  Eyes:     Conjunctiva/sclera: Conjunctivae normal.  Cardiovascular:  Rate and Rhythm: Regular rhythm. Tachycardia present.  Pulmonary:     Effort: Tachypnea present.     Breath sounds: Decreased breath sounds present. No wheezing, rhonchi or rales.  Abdominal:     Palpations: Abdomen is soft.     Tenderness: There is no abdominal tenderness. There is no guarding or rebound.  Musculoskeletal:     Cervical back: Neck supple.  Skin:    General: Skin is warm and dry.  Neurological:     Mental Status: He is alert.     ED Results / Procedures / Treatments   Labs (all labs ordered are listed, but only abnormal results are displayed) Labs Reviewed  SARS CORONAVIRUS 2 BY RT PCR ([facility]) - Abnormal; Notable for the following components:      Result Value   SARS Coronavirus 2 POSITIVE (*)    All other components within normal limits  BASIC METABOLIC PANEL - Abnormal; Notable for the following components:   Glucose, Bld 133 (*)    BUN 28 (*)    Creatinine, Ser 1.33 (*)    GFR calc non Af Amer 57 (*)     All other components within normal limits  CBC WITH DIFFERENTIAL/PLATELET - Abnormal; Notable for the following components:   WBC 11.6 (*)    Neutro Abs 10.1 (*)    Lymphs Abs 0.5 (*)    All other components within normal limits    EKG EKG Interpretation  Date/Time:  Tuesday March 15 2020 10:44:35 EDT Ventricular Rate:  95 PR Interval:  136 QRS Duration: 66 QT Interval:  306 QTC Calculation: 384 R Axis:   76 Text Interpretation: Sinus rhythm Right atrial enlargement Nonspecific ST abnormality Abnormal ECG Poor data quality Confirmed by Virgel Manifold [phone]) on 03/15/2020 1:49:22 PM   Radiology DG Chest Portable 1 View  Result Date: 03/15/2020 CLINICAL DATA:  Productive cough, shortness of breath EXAM: PORTABLE CHEST 1 VIEW COMPARISON:  06/12/2019 FINDINGS: The heart size and mediastinal contours are within normal limits. Atherosclerotic calcification of the aortic knob. Bibasilar interstitial opacities, left slightly greater than right. No pleural effusion or pneumothorax. Chronic posttraumatic deformity of the right humeral head with large 2.5 cm ossified loose body projecting within the axillary pouch. IMPRESSION: Bibasilar interstitial opacities, left slightly greater than right, which may reflect atelectasis versus atypical/viral infection. Electronically Signed   By: Davina Poke D.O.   On: 03/15/2020 11:14    Procedures Procedures (including critical care time)  Medications Ordered in ED Medications  sodium chloride 0.9 % bolus 500 mL (500 mLs Intravenous New Bag/Given 03/15/20 1420)  ondansetron (ZOFRAN) injection 4 mg (4 mg Intravenous Given 03/15/20 1420)  albuterol (VENTOLIN HFA) 108 (90 Base) MCG/ACT inhaler 2 puff (2 puffs Inhalation Given 03/15/20 1421)    ED Course  I have reviewed the triage vital signs and the nursing notes.  Pertinent labs & imaging results that were available during my care of the patient were reviewed by me and considered in my medical  decision making (see chart for details).  Clinical Course as of Mar 15 1648  Tue Mar 15, 2020  1349 SARS Coronavirus 2(!): POSITIVE [MV]    Clinical Course User Index [MV] Eustaquio Maize, [place]   MDM Rules/Calculators/A&P                           64 year old male who presents to the ED today with complaint of cough, shortness  of breath, nausea, vomiting, diarrhea for the past 1.5 weeks.  He is unvaccinated against COVID-19.  On arrival to the ED patient is afebrile, nontachypneic.  He is tachycardic in the low 100s.  Satting 93% on room air.  He had a Covid test while he was in the waiting room which has returned positive.  Chest x-ray findings concerning for viral pneumonia.  On my exam patient is actively coughing.  He does appear mildly tachypneic.  He has diminished breath sounds throughout.  He also appears dry on exam.  He does report his main concern today was the dry heaving associated with eating or drinking anything.  We will plan to start IV, provide Zofran, provide small amount of fluids.  Will check labs to ensure there are no electrolyte derangements given vomiting and diarrhea at home.  We will also provide albuterol inhaler given tachypnea and reassess.  We will plan to ambulate patient with a pulse ox to ensure he does not desaturate.  If everything looks okay plan to discharge patient home with instructions to self isolate for 14 days.   Pt ambulated well, O2 stayed at 96%.   CBC with leukocytosis 11.6. Hgb 14.2.  BMP with creatinine 1.33. Do not have baseline to compare to. BUN slightly elevated at 28 consistent with dehydration. Fluids provided.   Labwork reassuring at this time. O2 sats have not decreased prompting need for admission. Will plan to discharge pt home at this time with Rx zofran for his nausea/vomiting and tessalon perles for his cough. He is instructed to self isolate for 14 days starting today. He is to follow up with his PCP. He is in agreement with plan and  stable for discharge home.   This note was prepared using Dragon voice recognition software and may include unintentional dictation errors due to the inherent limitations of voice recognition software.  Michael Brady was evaluated in Emergency Department on 03/15/2020 for the symptoms described in the history of present illness. He was evaluated in the context of the global COVID-19 pandemic, which necessitated consideration that the patient might be at risk for infection with the SARS-CoV-2 virus that causes COVID-19. Institutional protocols and algorithms that pertain to the evaluation of patients at risk for COVID-19 are in a state of rapid change based on information released by regulatory bodies including the CDC and federal and state organizations. These policies and algorithms were followed during the patient's care in the ED.   Final Clinical Impression(s) / ED Diagnoses Final diagnoses:  [id number]    Rx / DC Orders ED Discharge Orders         Ordered    benzonatate (TESSALON) 100 MG capsule  Every 8 hours        03/15/20 1647    ondansetron (ZOFRAN ODT) 4 MG disintegrating tablet  Every 8 hours PRN        03/15/20 1647           Discharge Instructions     You have tested POSITIVE for COVID 19 today. Please stay home and self isolate for 14 days starting today (cleared: 03/30/20).   I have prescribed cough medication as well as nausea medication. Take as prescribed.   Drink plenty of fluids to stay hydrated.   Follow up with your PCP regarding your ED visit today.   Return to the ED for any worsening symptoms       Eustaquio Maize, Hershal Coria 03/15/20 1649    Milton Ferguson, MD 04/22/20 1105

## 2020-03-15 NOTE — ED Notes (Signed)
Pt ambulated well, O2 stayed at 96%.

## 2020-03-15 NOTE — ED Notes (Signed)
Date and time results received: 03/15/20 1252 (use smartphrase ".now" to insert current time)  Test: Covid Critical Value:Positive Name of Provider Notified: Dr Wilson Singer Orders Received? Or Actions Taken?:NA

## 2020-04-27 ENCOUNTER — Telehealth: Payer: Self-pay | Admitting: Family Medicine

## 2020-04-28 NOTE — Telephone Encounter (Signed)
error 

## 2020-05-31 ENCOUNTER — Other Ambulatory Visit: Payer: Self-pay

## 2020-05-31 ENCOUNTER — Encounter: Payer: Self-pay | Admitting: Family Medicine

## 2020-05-31 ENCOUNTER — Ambulatory Visit: Payer: 59 | Admitting: Family Medicine

## 2020-05-31 VITALS — BP 128/80 | HR 82 | Temp 98.5°F | Ht 69.0 in | Wt 168.0 lb

## 2020-05-31 DIAGNOSIS — Z8616 Personal history of COVID-19: Secondary | ICD-10-CM

## 2020-05-31 DIAGNOSIS — Z23 Encounter for immunization: Secondary | ICD-10-CM

## 2020-05-31 DIAGNOSIS — F321 Major depressive disorder, single episode, moderate: Secondary | ICD-10-CM | POA: Diagnosis not present

## 2020-05-31 DIAGNOSIS — F17200 Nicotine dependence, unspecified, uncomplicated: Secondary | ICD-10-CM | POA: Insufficient documentation

## 2020-05-31 DIAGNOSIS — E785 Hyperlipidemia, unspecified: Secondary | ICD-10-CM | POA: Diagnosis not present

## 2020-05-31 DIAGNOSIS — R0982 Postnasal drip: Secondary | ICD-10-CM

## 2020-05-31 MED ORDER — FLUTICASONE PROPIONATE 50 MCG/ACT NA SUSP: 2.0000 | Freq: Every day | NASAL | 1 refills | Status: DC

## 2020-05-31 MED ORDER — CITALOPRAM HYDROBROMIDE 20 MG PO TABS: ORAL_TABLET | ORAL | 1 refills | Status: DC

## 2020-05-31 NOTE — Patient Instructions (Signed)
Generic claritin or zytrect daily and flonase daily for nasal congestion.

## 2020-05-31 NOTE — Progress Notes (Signed)
Patient ID: Michael Brady, male    DOB: 1956/03/08, 64 y.o.   MRN: [id number]   Chief Complaint  Patient presents with  . Depression   Subjective:    HPI  F/u depression. Takes celexa 20mg  daily. Pt states no concerns today.  Pt stating doing well with this dosage.  Has been on it since 2018.  Would like a flu vaccine.   Had covid 2 months ago.  Mild case. Had to ER and got fluids.  Had renal surgery in 2019- cancerous and had kidney removed. Has been on celexa since then in 2018.   After showering noticing the nose was congested.  Then feeling anxiety when it gets congested.  Medical History Michael Brady has a past medical history of Cancer [facility]), Diverticulosis of colon, Dysuria, Hematuria, History of closed dislocation of shoulder (06/2014), PONV (postoperative nausea and vomiting), Renal mass, left, Smokers' cough ([place]), Wears dentures, and Wears glasses.   Outpatient Encounter Medications as of 05/31/2020  Medication Sig  . citalopram (CELEXA) 20 MG tablet Take 1 tab p.o. daily.  . Multiple Vitamins-Minerals (MULTIVITAMIN WITH MINERALS) tablet Take 1 tablet by mouth daily.  . [DISCONTINUED] citalopram (CELEXA) 20 MG tablet TAKE 1 TABLET(20 MG) BY MOUTH AT BEDTIME  . fluticasone (FLONASE) 50 MCG/ACT nasal spray Place 2 sprays into both nostrils daily.  . [DISCONTINUED] acetaminophen (TYLENOL) 325 MG tablet Take 650 mg by mouth every 6 (six) hours as needed.  . [DISCONTINUED] benzonatate (TESSALON) 100 MG capsule Take 1 capsule (100 mg total) by mouth every 8 (eight) hours.  . [DISCONTINUED] Naproxen Sodium (ALEVE) 220 MG CAPS Take by mouth. 2 IN AM 2 IN PM (Patient not taking: Reported on 03/15/2020)  . [DISCONTINUED] ondansetron (ZOFRAN ODT) 4 MG disintegrating tablet Take 1 tablet (4 mg total) by mouth every 8 (eight) hours as needed for nausea or vomiting.  . [DISCONTINUED] traMADol (ULTRAM) 50 MG tablet Take 1-2 tablets (50-100 mg total) by mouth every 6 (six) hours as needed for  moderate pain or severe pain. Post-operatively (Patient not taking: Reported on 08/27/2018)   No facility-administered encounter medications on file as of 05/31/2020.     Review of Systems  Constitutional: Negative for chills and fever.  HENT: Positive for congestion. Negative for rhinorrhea and sore throat.   Respiratory: Negative for cough, shortness of breath and wheezing.   Cardiovascular: Negative for chest pain and leg swelling.  Gastrointestinal: Negative for abdominal pain, diarrhea, nausea and vomiting.  Genitourinary: Negative for dysuria and frequency.  Skin: Negative for rash.  Neurological: Negative for dizziness, weakness and headaches.     Vitals BP 128/80   Pulse 82   Temp 98.5 F (36.9 C)   Ht 5\' 9"  (1.753 m)   Wt 168 lb (76.2 kg)   SpO2 100%   BMI 24.81 kg/m   Objective:   Physical Exam Vitals and nursing note reviewed.  Constitutional:      General: He is not in acute distress.    Appearance: Normal appearance. He is not ill-appearing.  HENT:     Head: Normocephalic.     Nose: Nose normal. No congestion.     Mouth/Throat:     Mouth: Mucous membranes are moist.     Pharynx: No oropharyngeal exudate.  Eyes:     Extraocular Movements: Extraocular movements intact.     Conjunctiva/sclera: Conjunctivae normal.     Pupils: Pupils are equal, round, and reactive to light.  Cardiovascular:     Rate and Rhythm: Normal  rate and regular rhythm.     Pulses: Normal pulses.     Heart sounds: Normal heart sounds. No murmur heard.   Pulmonary:     Effort: Pulmonary effort is normal.     Breath sounds: Normal breath sounds. No wheezing, rhonchi or rales.  Musculoskeletal:        General: Normal range of motion.     Right lower leg: No edema.     Left lower leg: No edema.  Skin:    General: Skin is warm and dry.     Findings: No rash.  Neurological:     General: No focal deficit present.     Mental Status: He is alert and oriented to person, place, and  time.     Cranial Nerves: No cranial nerve deficit.  Psychiatric:        Mood and Affect: Mood normal.        Behavior: Behavior normal.        Thought Content: Thought content normal.        Judgment: Judgment normal.      Assessment and Plan   1. Depression, major, single episode, moderate ([facility]) - citalopram (CELEXA) 20 MG tablet; Take 1 tab p.o. daily.  Dispense: 90 tablet; Refill: 1  2. Hyperlipidemia LDL goal <130  3. Post-nasal drip - fluticasone (FLONASE) 50 MCG/ACT nasal spray; Place 2 sprays into both nostrils daily.  Dispense: 16 g; Refill: 1  4. Need for vaccination  5. History of COVID-19  6. Smoker   Depression-stable. Cont celexa 20mg .   Pt had labs in 9/21. Slight inc in Cr at 1.33. pt was dehydrated from covid illness at that time. Pt has recovered from covid 19 and no residual issues.  Pt decided to cancel flu vaccine today and get it next week.  Post nasal drip and allergic rhinitis- use flonase and generic allergy medication daily  F/u 38mo to f/u depression.

## 2020-07-28 ENCOUNTER — Other Ambulatory Visit: Payer: Self-pay | Admitting: Family Medicine

## 2020-07-28 DIAGNOSIS — R0982 Postnasal drip: Secondary | ICD-10-CM

## 2020-11-28 ENCOUNTER — Other Ambulatory Visit: Payer: Self-pay

## 2020-11-28 ENCOUNTER — Encounter: Payer: Self-pay | Admitting: Family Medicine

## 2020-11-28 ENCOUNTER — Ambulatory Visit: Payer: 59 | Admitting: Family Medicine

## 2020-11-28 VITALS — BP 126/81 | HR 70 | Temp 97.2°F | Ht 69.0 in | Wt 165.0 lb

## 2020-11-28 DIAGNOSIS — R0982 Postnasal drip: Secondary | ICD-10-CM | POA: Diagnosis not present

## 2020-11-28 DIAGNOSIS — J302 Other seasonal allergic rhinitis: Secondary | ICD-10-CM | POA: Diagnosis not present

## 2020-11-28 DIAGNOSIS — F321 Major depressive disorder, single episode, moderate: Secondary | ICD-10-CM | POA: Diagnosis not present

## 2020-11-28 MED ORDER — CITALOPRAM HYDROBROMIDE 20 MG PO TABS: ORAL_TABLET | ORAL | 1 refills | Status: DC

## 2020-11-28 MED ORDER — FLUTICASONE PROPIONATE 50 MCG/ACT NA SUSP: NASAL | 2 refills | Status: DC

## 2020-11-28 NOTE — Progress Notes (Signed)
Patient ID: Michael Brady, male    DOB: 15-Mar-1956, 65 y.o.   MRN: [id number]   Chief Complaint  Patient presents with  . Follow-up    Depression- reports no concerns   Subjective:    HPI  Pt seen for f/u depression.  Pt doing well.  Having h/o allergies.   celexa 20mg  daily. No new concerns with meds.  Feels it's helping.   Medical History [name] has a past medical history of Cancer [facility]), Diverticulosis of colon, Dysuria, Hematuria, History of closed dislocation of shoulder (06/2014), PONV (postoperative nausea and vomiting), Renal mass, left, Smokers' cough ([place]), Wears dentures, and Wears glasses.   Outpatient Encounter Medications as of 11/28/2020  Medication Sig  . Multiple Vitamins-Minerals (MULTIVITAMIN WITH MINERALS) tablet Take 1 tablet by mouth daily.  . [DISCONTINUED] citalopram (CELEXA) 20 MG tablet Take 1 tab p.o. daily.  . [DISCONTINUED] fluticasone (FLONASE) 50 MCG/ACT nasal spray SHAKE LIQUID AND USE 2 SPRAYS IN EACH NOSTRIL DAILY  . citalopram (CELEXA) 20 MG tablet Take 1 tab p.o. daily.  . fluticasone (FLONASE) 50 MCG/ACT nasal spray SHAKE LIQUID AND USE 2 SPRAYS IN EACH NOSTRIL DAILY   No facility-administered encounter medications on file as of 11/28/2020.     Review of Systems  Constitutional: Negative for chills and fever.  HENT: Negative for congestion, rhinorrhea and sore throat.   Respiratory: Negative for cough, shortness of breath and wheezing.   Cardiovascular: Negative for chest pain and leg swelling.  Gastrointestinal: Negative for abdominal pain, diarrhea, nausea and vomiting.  Genitourinary: Negative for dysuria and frequency.  Skin: Negative for rash.  Neurological: Negative for dizziness, weakness and headaches.  Psychiatric/Behavioral: Negative for decreased concentration, dysphoric mood, self-injury, sleep disturbance and suicidal ideas. The patient is not nervous/anxious.      Vitals BP 126/81   Pulse 70   Temp (!) 97.2 F (36.2  C)   Ht 5\' 9"  (1.753 m)   Wt 165 lb (74.8 kg)   SpO2 97%   BMI 24.37 kg/m   Objective:   Physical Exam Vitals and nursing note reviewed.  Constitutional:      General: He is not in acute distress.    Appearance: Normal appearance. He is not ill-appearing.  Cardiovascular:     Rate and Rhythm: Normal rate and regular rhythm.     Pulses: Normal pulses.     Heart sounds: Normal heart sounds.  Pulmonary:     Effort: Pulmonary effort is normal. No respiratory distress.     Breath sounds: Normal breath sounds.  Musculoskeletal:        General: Normal range of motion.  Skin:    General: Skin is warm and dry.     Findings: No rash.  Neurological:     General: No focal deficit present.     Mental Status: He is alert and oriented to person, place, and time.  Psychiatric:        Mood and Affect: Mood normal.        Behavior: Behavior normal.        Thought Content: Thought content normal.        Judgment: Judgment normal.      Assessment and Plan   1. Depression, major, single episode, moderate (HCC) - citalopram (CELEXA) 20 MG tablet; Take 1 tab p.o. daily.  Dispense: 90 tablet; Refill: 1  2. Post-nasal drip - fluticasone (FLONASE) 50 MCG/ACT nasal spray; SHAKE LIQUID AND USE 2 SPRAYS IN EACH NOSTRIL DAILY  Dispense: 16 g;  Refill: 2  3. Seasonal allergic rhinitis, unspecified trigger    Depression- stable.  Cont meds.  Allergies- flonase and otc allergy meds prn.  Return in about 6 months (around 05/31/2021) for f/u depression.   12/12/2020

## 2021-05-28 ENCOUNTER — Telehealth: Payer: Self-pay | Admitting: Family Medicine

## 2021-05-28 DIAGNOSIS — F321 Major depressive disorder, single episode, moderate: Secondary | ICD-10-CM

## 2021-05-29 NOTE — Telephone Encounter (Signed)
Sent my chart message 05/29/21

## 2021-06-26 NOTE — Telephone Encounter (Signed)
Second request to schedule appointment 06/26/21

## 2021-06-28 ENCOUNTER — Ambulatory Visit: Payer: BC Managed Care – PPO | Admitting: Family Medicine

## 2021-06-28 ENCOUNTER — Encounter: Payer: Self-pay | Admitting: Family Medicine

## 2021-06-28 ENCOUNTER — Other Ambulatory Visit: Payer: Self-pay

## 2021-06-28 VITALS — BP 140/82 | HR 82 | Temp 98.3°F | Ht 69.0 in | Wt 163.0 lb

## 2021-06-28 DIAGNOSIS — F321 Major depressive disorder, single episode, moderate: Secondary | ICD-10-CM

## 2021-06-28 DIAGNOSIS — Z125 Encounter for screening for malignant neoplasm of prostate: Secondary | ICD-10-CM | POA: Diagnosis not present

## 2021-06-28 DIAGNOSIS — Z859 Personal history of malignant neoplasm, unspecified: Secondary | ICD-10-CM | POA: Diagnosis not present

## 2021-06-28 DIAGNOSIS — N1831 Chronic kidney disease, stage 3a: Secondary | ICD-10-CM | POA: Diagnosis not present

## 2021-06-28 DIAGNOSIS — Z130 Encounter for screening for diseases of the blood and blood-forming organs and certain disorders involving the immune mechanism: Secondary | ICD-10-CM | POA: Diagnosis not present

## 2021-06-28 DIAGNOSIS — N183 Chronic kidney disease, stage 3 unspecified: Secondary | ICD-10-CM | POA: Insufficient documentation

## 2021-06-28 DIAGNOSIS — E785 Hyperlipidemia, unspecified: Secondary | ICD-10-CM

## 2021-06-28 DIAGNOSIS — R739 Hyperglycemia, unspecified: Secondary | ICD-10-CM

## 2021-06-28 NOTE — Progress Notes (Signed)
Subjective:  Patient ID: COE ANGELOS, male    DOB: [birth year]  Age: 65 y.o. MRN: [id number]  CC: Chief Complaint  Patient presents with   follow up depression     No current concerns    HPI:  65 year old male with a past medical history of left renal pelvis cancer as well as bladder cancer previously followed by urology, CKD, depression, hyperlipidemia, tobacco abuse presents for follow-up.  Patient reports that his depression is well controlled on Celexa. Just refilled.  Continues to smoke.   Has a history of cancer of the renal pelvis and bladder s/p left nephroureterectomy.  Has CKD as well.  Has not seen urology in over 6 months.  Unsure why.  Denies any hematuria or urinary symptoms at this time.  Has hyperlipidemia.  Has not had a lipid panel since 2020.  Needs labs.  Patient Active Problem List   Diagnosis Date Noted   CKD (chronic kidney disease) stage 3, GFR 30-59 ml/min (HCC) 06/28/2021   History of cancer 06/28/2021   Smoker 05/31/2020   Depression, major, single episode, moderate ([place]) 11/21/2017   Hyperlipidemia 04/03/2017    Social Hx   Social History   Socioeconomic History   Marital status: Married    Spouse name: Not on file   Number of children: Not on file   Years of education: Not on file   Highest education level: Not on file  Occupational History   Not on file  Tobacco Use   Smoking status: Every Day    Packs/day: 1.50    Years: 40.00    Pack years: 60.00    Types: Cigarettes   Smokeless tobacco: Never  Vaping Use   Vaping Use: Never used  Substance and Sexual Activity   Alcohol use: No   Drug use: No   Sexual activity: Not on file  Other Topics Concern   Not on file  Social History Narrative   Not on file   Social Determinants of Health   Financial Resource Strain: Not on file  Food Insecurity: Not on file  Transportation Needs: Not on file  Physical Activity: Not on file  Stress: Not on file  Social Connections: Not on file     Review of Systems Per HPI  Objective:  BP 140/82    Pulse 82    Temp 98.3 F (36.8 C)    Ht '5\' 9"'  (1.753 m)    Wt 163 lb (73.9 kg)    SpO2 95%    BMI 24.07 kg/m   BP/Weight 06/28/2021 11/28/2020 28/41/3244  Systolic BP 010 272 536  Diastolic BP 82 81 80  Wt. (Lbs) 163 165 168  BMI 24.07 24.37 24.81    Physical Exam Vitals and nursing note reviewed.  Constitutional:      General: He is not in acute distress.    Appearance: Normal appearance. He is not ill-appearing.  HENT:     Head: Normocephalic and atraumatic.  Eyes:     General:        Right eye: No discharge.        Left eye: No discharge.     Conjunctiva/sclera: Conjunctivae normal.  Cardiovascular:     Rate and Rhythm: Normal rate and regular rhythm.  Pulmonary:     Effort: Pulmonary effort is normal.     Breath sounds: Normal breath sounds.  Neurological:     Mental Status: He is alert.  Psychiatric:        Mood and Affect:  Mood normal.        Behavior: Behavior normal.    Lab Results  Component Value Date   WBC 11.6 (H) 03/15/2020   HGB 14.2 03/15/2020   HCT 44.2 03/15/2020   PLT 245 03/15/2020   GLUCOSE 133 (H) 03/15/2020   CHOL 215 (H) 09/02/2018   TRIG 132 09/02/2018   HDL 47 09/02/2018   LDLCALC 142 (H) 09/02/2018   ALT 17 09/02/2018   AST 25 09/02/2018   NA 138 03/15/2020   K 4.2 03/15/2020   CL 101 03/15/2020   CREATININE 1.33 (H) 03/15/2020   BUN 28 (H) 03/15/2020   CO2 23 03/15/2020     Assessment & Plan:   Problem List Items Addressed This Visit       Genitourinary   CKD (chronic kidney disease) stage 3, GFR 30-59 ml/min (HCC)   Relevant Orders   CMP14+EGFR     Other   Depression, major, single episode, moderate ([place])    Doing well.  Continue Celexa.      History of cancer   Relevant Orders   CBC   PSA   Hyperlipidemia    Lab ordered.  Will discuss treatment options once lipid panel returns.      Relevant Orders   Lipid panel   Other Visit Diagnoses      Screening for prostate cancer    -  Primary   Relevant Orders   PSA   Screening for deficiency anemia       Relevant Orders   CBC   Hyperglycemia       Relevant Orders   Hemoglobin A1c      Follow-up:  Return in about 3 months (around 09/26/2021) for Needs lab work prior.  [place]

## 2021-06-28 NOTE — Assessment & Plan Note (Signed)
Lab ordered.  Will discuss treatment options once lipid panel returns.

## 2021-06-28 NOTE — Assessment & Plan Note (Signed)
Doing well.  Continue Celexa.

## 2021-06-28 NOTE — Patient Instructions (Signed)
Labs early next year.  Continue your medication.  Take care  Dr. Lacinda Axon

## 2021-07-21 NOTE — Telephone Encounter (Signed)
Had appointment 06/28/21 for medication followup

## 2021-08-26 ENCOUNTER — Inpatient Hospital Stay (HOSPITAL_COMMUNITY): Payer: BC Managed Care – PPO | Admitting: Anesthesiology

## 2021-08-26 ENCOUNTER — Inpatient Hospital Stay (HOSPITAL_COMMUNITY)
Admission: EM | Admit: 2021-08-26 | Discharge: 2021-08-31 | DRG: 326 | Disposition: A | Discharge status: Home / Self Care | Payer: BC Managed Care – PPO | Attending: Internal Medicine | Admitting: Internal Medicine

## 2021-08-26 ENCOUNTER — Inpatient Hospital Stay (HOSPITAL_COMMUNITY): Payer: BC Managed Care – PPO

## 2021-08-26 ENCOUNTER — Other Ambulatory Visit: Payer: Self-pay

## 2021-08-26 ENCOUNTER — Emergency Department (HOSPITAL_COMMUNITY): Payer: BC Managed Care – PPO

## 2021-08-26 ENCOUNTER — Encounter (HOSPITAL_COMMUNITY)
Admission: EM | Disposition: A | Discharge status: Home / Self Care | Payer: Self-pay | Source: Home / Self Care | Attending: Family Medicine

## 2021-08-26 ENCOUNTER — Encounter (HOSPITAL_COMMUNITY): Payer: Self-pay

## 2021-08-26 DIAGNOSIS — R748 Abnormal levels of other serum enzymes: Secondary | ICD-10-CM | POA: Diagnosis present

## 2021-08-26 DIAGNOSIS — Z880 Allergy status to penicillin: Secondary | ICD-10-CM

## 2021-08-26 DIAGNOSIS — Z79899 Other long term (current) drug therapy: Secondary | ICD-10-CM

## 2021-08-26 DIAGNOSIS — D72829 Elevated white blood cell count, unspecified: Secondary | ICD-10-CM | POA: Diagnosis present

## 2021-08-26 DIAGNOSIS — F32A Depression, unspecified: Secondary | ICD-10-CM | POA: Diagnosis present

## 2021-08-26 DIAGNOSIS — N179 Acute kidney failure, unspecified: Secondary | ICD-10-CM | POA: Diagnosis not present

## 2021-08-26 DIAGNOSIS — K430 Incisional hernia with obstruction, without gangrene: Secondary | ICD-10-CM | POA: Diagnosis not present

## 2021-08-26 DIAGNOSIS — Z5320 Procedure and treatment not carried out because of patient's decision for unspecified reasons: Secondary | ICD-10-CM

## 2021-08-26 DIAGNOSIS — F17210 Nicotine dependence, cigarettes, uncomplicated: Secondary | ICD-10-CM | POA: Diagnosis not present

## 2021-08-26 DIAGNOSIS — R109 Unspecified abdominal pain: Secondary | ICD-10-CM | POA: Diagnosis not present

## 2021-08-26 DIAGNOSIS — N289 Disorder of kidney and ureter, unspecified: Secondary | ICD-10-CM

## 2021-08-26 DIAGNOSIS — J410 Simple chronic bronchitis: Secondary | ICD-10-CM | POA: Diagnosis not present

## 2021-08-26 DIAGNOSIS — K420 Umbilical hernia with obstruction, without gangrene: Secondary | ICD-10-CM | POA: Diagnosis present

## 2021-08-26 DIAGNOSIS — Z8551 Personal history of malignant neoplasm of bladder: Secondary | ICD-10-CM

## 2021-08-26 DIAGNOSIS — K275 Chronic or unspecified peptic ulcer, site unspecified, with perforation: Secondary | ICD-10-CM | POA: Diagnosis present

## 2021-08-26 DIAGNOSIS — R1013 Epigastric pain: Secondary | ICD-10-CM | POA: Diagnosis present

## 2021-08-26 DIAGNOSIS — Z8659 Personal history of other mental and behavioral disorders: Secondary | ICD-10-CM

## 2021-08-26 DIAGNOSIS — J984 Other disorders of lung: Secondary | ICD-10-CM | POA: Diagnosis not present

## 2021-08-26 DIAGNOSIS — N1832 Chronic kidney disease, stage 3b: Secondary | ICD-10-CM | POA: Diagnosis present

## 2021-08-26 DIAGNOSIS — E785 Hyperlipidemia, unspecified: Secondary | ICD-10-CM | POA: Diagnosis not present

## 2021-08-26 DIAGNOSIS — K631 Perforation of intestine (nontraumatic): Secondary | ICD-10-CM | POA: Diagnosis not present

## 2021-08-26 DIAGNOSIS — Z905 Acquired absence of kidney: Secondary | ICD-10-CM

## 2021-08-26 DIAGNOSIS — R918 Other nonspecific abnormal finding of lung field: Secondary | ICD-10-CM | POA: Diagnosis not present

## 2021-08-26 DIAGNOSIS — I700 Atherosclerosis of aorta: Secondary | ICD-10-CM | POA: Diagnosis not present

## 2021-08-26 DIAGNOSIS — Z8553 Personal history of malignant neoplasm of renal pelvis: Secondary | ICD-10-CM

## 2021-08-26 DIAGNOSIS — D75839 Thrombocytosis, unspecified: Secondary | ICD-10-CM | POA: Diagnosis present

## 2021-08-26 DIAGNOSIS — M19011 Primary osteoarthritis, right shoulder: Secondary | ICD-10-CM | POA: Diagnosis not present

## 2021-08-26 DIAGNOSIS — K265 Chronic or unspecified duodenal ulcer with perforation: Secondary | ICD-10-CM

## 2021-08-26 DIAGNOSIS — K668 Other specified disorders of peritoneum: Secondary | ICD-10-CM

## 2021-08-26 DIAGNOSIS — K3189 Other diseases of stomach and duodenum: Secondary | ICD-10-CM | POA: Diagnosis not present

## 2021-08-26 DIAGNOSIS — R0982 Postnasal drip: Secondary | ICD-10-CM

## 2021-08-26 DIAGNOSIS — K5720 Diverticulitis of large intestine with perforation and abscess without bleeding: Principal | ICD-10-CM | POA: Diagnosis present

## 2021-08-26 DIAGNOSIS — K469 Unspecified abdominal hernia without obstruction or gangrene: Secondary | ICD-10-CM | POA: Diagnosis not present

## 2021-08-26 DIAGNOSIS — R9431 Abnormal electrocardiogram [ECG] [EKG]: Secondary | ICD-10-CM | POA: Diagnosis not present

## 2021-08-26 DIAGNOSIS — J020 Streptococcal pharyngitis: Secondary | ICD-10-CM | POA: Diagnosis not present

## 2021-08-26 DIAGNOSIS — R531 Weakness: Secondary | ICD-10-CM

## 2021-08-26 DIAGNOSIS — K5790 Diverticulosis of intestine, part unspecified, without perforation or abscess without bleeding: Secondary | ICD-10-CM | POA: Diagnosis not present

## 2021-08-26 DIAGNOSIS — F17200 Nicotine dependence, unspecified, uncomplicated: Secondary | ICD-10-CM | POA: Diagnosis present

## 2021-08-26 DIAGNOSIS — Z0189 Encounter for other specified special examinations: Secondary | ICD-10-CM

## 2021-08-26 DIAGNOSIS — Z4682 Encounter for fitting and adjustment of non-vascular catheter: Secondary | ICD-10-CM | POA: Diagnosis not present

## 2021-08-26 DIAGNOSIS — N183 Chronic kidney disease, stage 3 unspecified: Secondary | ICD-10-CM | POA: Diagnosis present

## 2021-08-26 DIAGNOSIS — Z20822 Contact with and (suspected) exposure to covid-19: Secondary | ICD-10-CM | POA: Diagnosis not present

## 2021-08-26 DIAGNOSIS — M7989 Other specified soft tissue disorders: Secondary | ICD-10-CM | POA: Diagnosis not present

## 2021-08-26 HISTORY — PX: GASTRORRHAPHY: SHX6263

## 2021-08-26 HISTORY — PX: LAPAROTOMY: SHX154

## 2021-08-26 HISTORY — PX: INCISIONAL HERNIA REPAIR: SHX193

## 2021-08-26 LAB — CBC WITH DIFFERENTIAL/PLATELET
Abs Immature Granulocytes: 0.06 10*3/uL (ref 0.00–0.07)
Basophils Absolute: 0.1 10*3/uL (ref 0.0–0.1)
Basophils Relative: 1 %
Eosinophils Absolute: 0.1 10*3/uL (ref 0.0–0.5)
Eosinophils Relative: 1 %
HCT: 49.1 % (ref 39.0–52.0)
Hemoglobin: 15.7 g/dL (ref 13.0–17.0)
Immature Granulocytes: 1 %
Lymphocytes Relative: 42 %
Lymphs Abs: 4.6 10*3/uL — ABNORMAL HIGH (ref 0.7–4.0)
MCH: 30.5 pg (ref 26.0–34.0)
MCHC: 32 g/dL (ref 30.0–36.0)
MCV: 95.3 fL (ref 80.0–100.0)
Monocytes Absolute: 0.6 10*3/uL (ref 0.1–1.0)
Monocytes Relative: 6 %
Neutro Abs: 5.4 10*3/uL (ref 1.7–7.7)
Neutrophils Relative %: 49 %
Platelets: 431 10*3/uL — ABNORMAL HIGH (ref 150–400)
RBC: 5.15 MIL/uL (ref 4.22–5.81)
RDW: 12.5 % (ref 11.5–15.5)
WBC: 10.8 10*3/uL — ABNORMAL HIGH (ref 4.0–10.5)
nRBC: 0 % (ref 0.0–0.2)

## 2021-08-26 LAB — COMPREHENSIVE METABOLIC PANEL
ALT: 15 U/L (ref 0–44)
AST: 21 U/L (ref 15–41)
Albumin: 4.2 g/dL (ref 3.5–5.0)
Alkaline Phosphatase: 84 U/L (ref 38–126)
Anion gap: 13 (ref 5–15)
BUN: 28 mg/dL — ABNORMAL HIGH (ref 8–23)
CO2: 23 mmol/L (ref 22–32)
Calcium: 9.3 mg/dL (ref 8.9–10.3)
Chloride: 99 mmol/L (ref 98–111)
Creatinine, Ser: 1.44 mg/dL — ABNORMAL HIGH (ref 0.61–1.24)
GFR, Estimated: 55 mL/min — ABNORMAL LOW (ref >60)
Glucose, Bld: 135 mg/dL — ABNORMAL HIGH (ref 70–99)
Potassium: 4.3 mmol/L (ref 3.5–5.1)
Sodium: 135 mmol/L (ref 135–145)
Total Bilirubin: 0.5 mg/dL (ref 0.3–1.2)
Total Protein: 7.9 g/dL (ref 6.5–8.1)

## 2021-08-26 LAB — LACTIC ACID, PLASMA: Lactic Acid, Venous: 1.3 mmol/L (ref 0.5–1.9)

## 2021-08-26 LAB — RESP PANEL BY RT-PCR (FLU A&B, COVID) ARPGX2
Influenza A by PCR: NEGATIVE
Influenza B by PCR: NEGATIVE
SARS Coronavirus 2 by RT PCR: NEGATIVE

## 2021-08-26 LAB — TROPONIN I (HIGH SENSITIVITY)
Troponin I (High Sensitivity): 5 ng/L (ref <18)
Troponin I (High Sensitivity): 4 ng/L (ref <18)

## 2021-08-26 LAB — LIPASE, BLOOD: Lipase: 61 U/L — ABNORMAL HIGH (ref 11–51)

## 2021-08-26 LAB — HIV ANTIBODY (ROUTINE TESTING W REFLEX): HIV Screen 4th Generation wRfx: NONREACTIVE

## 2021-08-26 SURGERY — LAPAROTOMY, EXPLORATORY
Anesthesia: General | Laterality: Right

## 2021-08-26 MED ORDER — ROCURONIUM BROMIDE 100 MG/10ML IV SOLN
INTRAVENOUS | Status: DC | PRN
  Administered 2021-08-26: 50 mg via INTRAVENOUS

## 2021-08-26 MED ORDER — FENTANYL CITRATE (PF) 100 MCG/2ML IJ SOLN
INTRAMUSCULAR | Status: AC
  Filled 2021-08-26: qty 2

## 2021-08-26 MED ORDER — FENTANYL CITRATE (PF) 100 MCG/2ML IJ SOLN
INTRAMUSCULAR | Status: DC | PRN
  Administered 2021-08-26 (×2): 50 ug via INTRAVENOUS

## 2021-08-26 MED ORDER — LACTATED RINGERS IV SOLN: INTRAVENOUS | Status: DC

## 2021-08-26 MED ORDER — ACETAMINOPHEN 120 MG RE SUPP
120.0000 mg | RECTAL | Status: DC | PRN
  Filled 2021-08-26: qty 1

## 2021-08-26 MED ORDER — LACTATED RINGERS IV SOLN: INTRAVENOUS | Status: DC | PRN

## 2021-08-26 MED ORDER — ENOXAPARIN SODIUM 30 MG/0.3ML IJ SOSY: 30.0000 mg | PREFILLED_SYRINGE | INTRAMUSCULAR | Status: DC

## 2021-08-26 MED ORDER — MORPHINE SULFATE (PF) 4 MG/ML IV SOLN
4.0000 mg | Freq: Once | INTRAVENOUS | Status: AC
  Administered 2021-08-26: 4 mg via INTRAVENOUS
  Filled 2021-08-26: qty 1

## 2021-08-26 MED ORDER — PANTOPRAZOLE SODIUM 40 MG IV SOLR
40.0000 mg | Freq: Two times a day (BID) | INTRAVENOUS | Status: DC
  Administered 2021-08-26 – 2021-08-31 (×10): 40 mg via INTRAVENOUS
  Filled 2021-08-26 (×11): qty 10

## 2021-08-26 MED ORDER — MIDAZOLAM HCL 5 MG/5ML IJ SOLN
INTRAMUSCULAR | Status: DC | PRN
  Administered 2021-08-26: 2 mg via INTRAVENOUS

## 2021-08-26 MED ORDER — ONDANSETRON HCL 4 MG/2ML IJ SOLN
INTRAMUSCULAR | Status: AC
Start: 2021-08-26 — End: ?
  Filled 2021-08-26: qty 2

## 2021-08-26 MED ORDER — HYDROMORPHONE HCL 1 MG/ML IJ SOLN
0.5000 mg | INTRAMUSCULAR | Status: DC | PRN
  Administered 2021-08-26 – 2021-08-28 (×7): 1 mg via INTRAVENOUS
  Administered 2021-08-28: 0.5 mg via INTRAVENOUS
  Administered 2021-08-28 (×2): 1 mg via INTRAVENOUS
  Administered 2021-08-28: 0.5 mg via INTRAVENOUS
  Administered 2021-08-29 (×2): 1 mg via INTRAVENOUS
  Administered 2021-08-29 – 2021-08-30 (×3): 0.5 mg via INTRAVENOUS
  Filled 2021-08-26 (×2): qty 1
  Filled 2021-08-26: qty 0.5
  Filled 2021-08-26 (×6): qty 1
  Filled 2021-08-26 (×2): qty 0.5
  Filled 2021-08-26 (×2): qty 1
  Filled 2021-08-26 (×2): qty 0.5
  Filled 2021-08-26 (×2): qty 1

## 2021-08-26 MED ORDER — BUPIVACAINE HCL 0.25 % IJ SOLN
INTRAMUSCULAR | Status: DC | PRN
  Administered 2021-08-26: 20 mL

## 2021-08-26 MED ORDER — ONDANSETRON HCL 4 MG/2ML IJ SOLN: 4.0000 mg | Freq: Once | INTRAMUSCULAR | Status: DC | PRN

## 2021-08-26 MED ORDER — METRONIDAZOLE 500 MG/100ML IV SOLN
INTRAVENOUS | Status: AC
  Administered 2021-08-26: 500 mg via INTRAVENOUS
  Filled 2021-08-26: qty 100

## 2021-08-26 MED ORDER — MIDAZOLAM HCL 2 MG/2ML IJ SOLN
INTRAMUSCULAR | Status: AC
  Filled 2021-08-26: qty 2

## 2021-08-26 MED ORDER — ONDANSETRON HCL 4 MG/2ML IJ SOLN
4.0000 mg | Freq: Once | INTRAMUSCULAR | Status: AC
  Administered 2021-08-26: 4 mg via INTRAVENOUS
  Filled 2021-08-26: qty 2

## 2021-08-26 MED ORDER — FLUCONAZOLE IN SODIUM CHLORIDE 200-0.9 MG/100ML-% IV SOLN
200.0000 mg | INTRAVENOUS | Status: DC
  Administered 2021-08-27 – 2021-08-30 (×4): 200 mg via INTRAVENOUS
  Filled 2021-08-26 (×4): qty 100

## 2021-08-26 MED ORDER — ONDANSETRON HCL 4 MG/2ML IJ SOLN: 4.0000 mg | Freq: Four times a day (QID) | INTRAMUSCULAR | Status: DC | PRN

## 2021-08-26 MED ORDER — SODIUM CHLORIDE 0.9 % IV SOLN
2.0000 g | Freq: Once | INTRAVENOUS | Status: AC
  Administered 2021-08-26: 2 g via INTRAVENOUS
  Filled 2021-08-26: qty 20

## 2021-08-26 MED ORDER — PROPOFOL 10 MG/ML IV BOLUS
INTRAVENOUS | Status: DC | PRN
  Administered 2021-08-26: 100 mg via INTRAVENOUS

## 2021-08-26 MED ORDER — ONDANSETRON HCL 4 MG PO TABS: 4.0000 mg | ORAL_TABLET | Freq: Four times a day (QID) | ORAL | Status: DC | PRN

## 2021-08-26 MED ORDER — PANTOPRAZOLE SODIUM 40 MG IV SOLR: 40.0000 mg | Freq: Two times a day (BID) | INTRAVENOUS | Status: DC

## 2021-08-26 MED ORDER — PANTOPRAZOLE 80MG IVPB - SIMPLE MED
80.0000 mg | Freq: Once | INTRAVENOUS | Status: AC
  Administered 2021-08-26: 80 mg via INTRAVENOUS
  Filled 2021-08-26: qty 100

## 2021-08-26 MED ORDER — METRONIDAZOLE 500 MG/100ML IV SOLN
500.0000 mg | Freq: Three times a day (TID) | INTRAVENOUS | Status: DC
  Administered 2021-08-26 – 2021-08-31 (×15): 500 mg via INTRAVENOUS
  Filled 2021-08-26 (×14): qty 100

## 2021-08-26 MED ORDER — METRONIDAZOLE 500 MG/100ML IV SOLN
500.0000 mg | Freq: Once | INTRAVENOUS | Status: AC
  Administered 2021-08-26: 500 mg via INTRAVENOUS
  Filled 2021-08-26: qty 100

## 2021-08-26 MED ORDER — NICOTINE 21 MG/24HR TD PT24: 21.0000 mg | MEDICATED_PATCH | Freq: Every day | TRANSDERMAL | Status: DC | PRN

## 2021-08-26 MED ORDER — ROCURONIUM BROMIDE 10 MG/ML (PF) SYRINGE
PREFILLED_SYRINGE | INTRAVENOUS | Status: AC
  Filled 2021-08-26: qty 10

## 2021-08-26 MED ORDER — IOHEXOL 300 MG/ML  SOLN
75.0000 mL | Freq: Once | INTRAMUSCULAR | Status: AC | PRN
  Administered 2021-08-26: 75 mL via INTRAVENOUS

## 2021-08-26 MED ORDER — BUPIVACAINE HCL (PF) 0.25 % IJ SOLN
INTRAMUSCULAR | Status: AC
  Filled 2021-08-26: qty 30

## 2021-08-26 MED ORDER — FLUCONAZOLE IN SODIUM CHLORIDE 400-0.9 MG/200ML-% IV SOLN
800.0000 mg | INTRAVENOUS | Status: DC
Start: 2021-08-26 — End: 2021-08-26

## 2021-08-26 MED ORDER — SUCCINYLCHOLINE CHLORIDE 200 MG/10ML IV SOSY
PREFILLED_SYRINGE | INTRAVENOUS | Status: AC
  Filled 2021-08-26: qty 10

## 2021-08-26 MED ORDER — LACTATED RINGERS IV BOLUS
1000.0000 mL | Freq: Once | INTRAVENOUS | Status: AC
  Administered 2021-08-26: 1000 mL via INTRAVENOUS

## 2021-08-26 MED ORDER — ALBUTEROL SULFATE (2.5 MG/3ML) 0.083% IN NEBU
2.5000 mg | INHALATION_SOLUTION | RESPIRATORY_TRACT | Status: DC | PRN
  Administered 2021-08-26 – 2021-08-29 (×2): 2.5 mg via RESPIRATORY_TRACT
  Filled 2021-08-26 (×2): qty 3

## 2021-08-26 MED ORDER — FENTANYL CITRATE PF 50 MCG/ML IJ SOSY
25.0000 ug | PREFILLED_SYRINGE | INTRAMUSCULAR | Status: DC | PRN
  Administered 2021-08-26: 50 ug via INTRAVENOUS
  Filled 2021-08-26: qty 1

## 2021-08-26 MED ORDER — SODIUM CHLORIDE 0.9 % IR SOLN
Status: DC | PRN
Start: 2021-08-26 — End: 2021-08-26
  Administered 2021-08-26 (×2): 1000 mL

## 2021-08-26 MED ORDER — PROPOFOL 10 MG/ML IV BOLUS
INTRAVENOUS | Status: AC
  Filled 2021-08-26: qty 20

## 2021-08-26 MED ORDER — ONDANSETRON HCL 4 MG/2ML IJ SOLN
INTRAMUSCULAR | Status: DC | PRN
Start: 2021-08-26 — End: 2021-08-26
  Administered 2021-08-26: 4 mg via INTRAVENOUS

## 2021-08-26 MED ORDER — SODIUM CHLORIDE 0.9 % IV SOLN
1.0000 g | INTRAVENOUS | Status: DC
  Administered 2021-08-27 – 2021-08-29 (×3): 1 g via INTRAVENOUS
  Filled 2021-08-26 (×4): qty 10

## 2021-08-26 MED ORDER — SUGAMMADEX SODIUM 200 MG/2ML IV SOLN
INTRAVENOUS | Status: DC | PRN
  Administered 2021-08-26: 200 mg via INTRAVENOUS

## 2021-08-26 MED ORDER — SODIUM CHLORIDE 0.9 % IV SOLN: INTRAVENOUS | Status: DC

## 2021-08-26 MED ORDER — SUCCINYLCHOLINE CHLORIDE 200 MG/10ML IV SOSY
PREFILLED_SYRINGE | INTRAVENOUS | Status: DC | PRN
  Administered 2021-08-26: 100 mg via INTRAVENOUS

## 2021-08-26 SURGICAL SUPPLY — 59 items
APPLIER CLIP 11 MED OPEN (CLIP)
APPLIER CLIP 13 LRG OPEN (CLIP)
BARRIER SKIN 2 3/4 (OSTOMY) IMPLANT
BARRIER SKIN OD2.25 2 3/4 FLNG (OSTOMY) IMPLANT
CHLORAPREP W/TINT 26 (MISCELLANEOUS) ×3 IMPLANT
CLAMP POUCH DRAINAGE QUIET (OSTOMY) IMPLANT
CLIP APPLIE 11 MED OPEN (CLIP) IMPLANT
CLIP APPLIE 13 LRG OPEN (CLIP) IMPLANT
CLOTH BEACON ORANGE TIMEOUT ST (SAFETY) ×3 IMPLANT
COVER LIGHT HANDLE STERIS (MISCELLANEOUS) ×6 IMPLANT
DRAPE WARM FLUID 44X44 (DRAPES) ×3 IMPLANT
DRSG OPSITE POSTOP 4X10 (GAUZE/BANDAGES/DRESSINGS) ×1 IMPLANT
DRSG OPSITE POSTOP 4X8 (GAUZE/BANDAGES/DRESSINGS) IMPLANT
ELECT BLADE 6 FLAT ULTRCLN (ELECTRODE) IMPLANT
ELECT REM PT RETURN 9FT ADLT (ELECTROSURGICAL) ×3
ELECTRODE REM PT RTRN 9FT ADLT (ELECTROSURGICAL) ×2 IMPLANT
GLOVE SURG ENC MOIS LTX SZ6.5 (GLOVE) ×5 IMPLANT
GLOVE SURG POLYISO LF SZ7.5 (GLOVE) ×3 IMPLANT
GLOVE SURG UNDER POLY LF SZ7 (GLOVE) ×8 IMPLANT
GOWN STRL REUS W/TWL LRG LVL3 (GOWN DISPOSABLE) ×10 IMPLANT
HANDLE SUCTION POOLE (INSTRUMENTS) ×2 IMPLANT
INST SET MAJOR GENERAL (KITS) ×3 IMPLANT
KIT REMOVER STAPLE SKIN (MISCELLANEOUS) IMPLANT
KIT TURNOVER KIT A (KITS) ×3 IMPLANT
LIGASURE IMPACT 36 18CM CVD LR (INSTRUMENTS) ×1 IMPLANT
MANIFOLD NEPTUNE II (INSTRUMENTS) ×3 IMPLANT
NDL HYPO 18GX1.5 BLUNT FILL (NEEDLE) ×2 IMPLANT
NDL HYPO 21X1.5 SAFETY (NEEDLE) ×2 IMPLANT
NEEDLE HYPO 18GX1.5 BLUNT FILL (NEEDLE) ×3 IMPLANT
NEEDLE HYPO 21X1.5 SAFETY (NEEDLE) ×3 IMPLANT
NS IRRIG 1000ML POUR BTL (IV SOLUTION) ×6 IMPLANT
PACK MAJOR ABDOMINAL (CUSTOM PROCEDURE TRAY) ×3 IMPLANT
PAD ARMBOARD 7.5X6 YLW CONV (MISCELLANEOUS) ×3 IMPLANT
PENCIL SMOKE EVACUATOR COATED (MISCELLANEOUS) ×3 IMPLANT
POUCH OSTOMY 2 3/4  H 3804 (WOUND CARE)
POUCH OSTOMY 2 PC DRNBL 2.75 (WOUND CARE) IMPLANT
RELOAD LINEAR CUT PROX 55 BLUE (ENDOMECHANICALS) IMPLANT
RELOAD PROXIMATE 75MM BLUE (ENDOMECHANICALS) IMPLANT
RELOAD STAPLE 55 3.8 BLU REG (ENDOMECHANICALS) IMPLANT
RELOAD STAPLE 75 3.8 BLU REG (ENDOMECHANICALS) IMPLANT
RETRACTOR WND ALEXIS-O 25 LRG (MISCELLANEOUS) IMPLANT
RETRACTOR WOUND ALXS 18CM MED (MISCELLANEOUS) IMPLANT
RTRCTR WOUND ALEXIS O 18CM MED (MISCELLANEOUS) ×3
RTRCTR WOUND ALEXIS O 25CM LRG (MISCELLANEOUS)
SET BASIN LINEN APH (SET/KITS/TRAYS/PACK) ×3 IMPLANT
SPONGE T-LAP 18X18 ~~LOC~~+RFID (SPONGE) ×3 IMPLANT
STAPLER GUN LINEAR PROX 60 (STAPLE) IMPLANT
STAPLER PROXIMATE 55 BLUE (STAPLE) IMPLANT
STAPLER PROXIMATE 75MM BLUE (STAPLE) IMPLANT
STAPLER VISISTAT (STAPLE) ×3 IMPLANT
SUCTION POOLE HANDLE (INSTRUMENTS) ×3
SUT CHROMIC 0 SH (SUTURE) IMPLANT
SUT CHROMIC 2 0 SH (SUTURE) IMPLANT
SUT CHROMIC 3 0 SH 27 (SUTURE) IMPLANT
SUT PDS AB CT VIOLET #0 27IN (SUTURE) ×4 IMPLANT
SUT PROLENE 2 0 SH 30 (SUTURE) IMPLANT
SUT SILK 3 0 SH CR/8 (SUTURE) ×3 IMPLANT
SYR 20ML LL LF (SYRINGE) ×6 IMPLANT
TRAY FOLEY MTR SLVR 16FR STAT (SET/KITS/TRAYS/PACK) ×3 IMPLANT

## 2021-08-26 NOTE — Assessment & Plan Note (Addendum)
WBC trending down to 10.9.  No fever, cultures no growth.  Recheck in outpatient setting.

## 2021-08-26 NOTE — H&P (Signed)
History and Physical    Patient: Michael Brady BSJ:[id number] DOB: March 08, 1956 DOA: 08/26/2021 DOS: the patient was seen and examined on 08/26/2021 PCP: Coral Spikes, DO  Patient coming from: Home  Chief Complaint:  Chief Complaint  Patient presents with   Abdominal Pain    HPI: Michael Brady is a 66 y/o male smoker with history of left renal pelvis malignancy and bladder tumor treated with resection in 2019 and 2020, depression, stage 3b CKD, ongoing tobacco use, remote history of hyperlipidemia (not on meds), allergic rhinitis who presented to ED early this morning after awakening with severe epigastric pain and chest pain without radiation.  There was no SOB. No nausea, vomiting or diarrhea.  This was a severe frightening pain that he has never experienced and it continues.  Any movement exacerbates the pain.  He has not been able to eat or drink. He has been evaluated in the ED his EKG did not show acute changes.  He had a mild leukocytosis and mild lipase elevation.  CT scan shows perforated viscus with free air present and an incarcerated umbilical hernia with inflammatory changes around the gastroduodenal junction.  Pt was started on IV protonix and IV antibiotics and IV fluids.  Surgery consulted and requested medical admission and plan is for patient to go for exploratory lap later today.     Review of Systems: Review of Systems  Constitutional:  Positive for malaise/fatigue. Negative for chills, diaphoresis and fever.  HENT: Negative.    Eyes: Negative.   Respiratory: Negative.    Cardiovascular:  Positive for chest pain. Negative for claudication, leg swelling and PND.  Gastrointestinal:  Positive for abdominal pain, constipation and heartburn.  Genitourinary: Negative.   Musculoskeletal: Negative.   Skin: Negative.   Neurological: Negative.   Endo/Heme/Allergies: Negative.   Psychiatric/Behavioral:  Positive for depression.   All other systems reviewed and are  negative.  Past Medical History:  Diagnosis Date   Cancer ([place])    left renal pelvis cnacer-12/05/2017   Diverticulosis of colon    NO ISSUES IN OVER 12 YEARS    Dysuria    Hematuria    10-11-17: REPORTS I "NOTICED SOME BLOOD IN MY URINE TODAY AFTER NOT SEEING ANY SINCE MY SURGERY IN [place] . I JUST HAD MY BLOOD DRAWN AT [facility] THIS LAST MONDAY BUT THEY [place] ME ABOUT IT YET"   History of closed dislocation of shoulder 06/2014   LEFT --- POST IV SEDATION CLOSED REDUCTION IN ED   PONV (postoperative nausea and vomiting)    Renal mass, left    LEFT RENAL COLLECTING SYSTEM MASS   Smokers' cough ([place])    [place]   Wears dentures    Wears glasses    Past Surgical History:  Procedure Laterality Date   CARPAL TUNNEL RELEASE Left 01-25-2006   dr Lorin [facility]  [facility]   and LEFT THUMB PULLEY RELEASE   COLONOSCOPY N/A 05/07/2017   Procedure: COLONOSCOPY;  Surgeon: Aviva Signs, MD;  [facility];  Service: Gastroenterology;  Laterality: N/A;   CYSTOSCOPY W/ RETROGRADES Right 07/25/2018   Procedure: CYSTOSCOPY WITH RETROGRADE PYELOGRAM;  Surgeon: Alexis Frock, MD;  Location: [facility];  Service: Urology;  Laterality: Right;   CYSTOSCOPY WITH URETEROSCOPY AND STENT PLACEMENT Left 07/29/2017   Procedure: CYSTOSCOPY WITH URETEROSCOPY AND STENT PLACEMENT;  Surgeon: [name], MD;  Location: [facility];  Service: Urology;  Laterality: Left;   CYSTOSCOPY/URETEROSCOPY/HOLMIUM LASER/STENT PLACEMENT Left 07/15/2017  Procedure: CYSTOSCOPY, ATTEMTED URETEROSCOPY/,STENT PLACEMENT;  Surgeon: [name], MD;  Location: [facility];  Service: Urology;  Laterality: Left;   CYSTOSCOPY/URETEROSCOPY/HOLMIUM LASER/STENT PLACEMENT Left 10/21/2017   Procedure: CYSTOSCOPY, RETROGRADE /URETEROSCOPY LEFT UPPER TRACT,BIOPSY, [name] LASER/STENT PLACEMENT;  Surgeon: [name], MD;  Location: [facility];  Service: Urology;   Laterality: Left;   HOLMIUM LASER APPLICATION Left 7/35/3299   Procedure: HOLMIUM LASER APPLICATION;  Surgeon: [name], MD;  Location: [facility];  Service: Urology;  Laterality: Left;   ROBOT ASSITED LAPAROSCOPIC NEPHROURETERECTOMY Left 12/11/2017   Procedure: XI ROBOT ASSITED LAPAROSCOPIC NEPHROURETERECTOMY;  Surgeon: Alexis Frock, MD;  Location: [facility];  Service: Urology;  Laterality: Left;   SHOULDER SURGERY Right 1997   ROTATOR CUFF REPAIR    THULIUM LASER TURP (TRANSURETHRAL RESECTION OF PROSTATE) Left 07/29/2017   Procedure: CYSTOSCOPY, LEFT URETEROSCOPY WITH TUMOR BIOPSYTHULLIUM LASER ABLATION OF TUMOR AND LEFT URETERAL STENT EXCHANGE;  Surgeon: [name], MD;  Location: [facility];  Service: Urology;  Laterality: Left;   TRANSURETHRAL RESECTION OF BLADDER TUMOR N/A 07/25/2018   Procedure: TRANSURETHRAL RESECTION OF BLADDER TUMOR (TURBT);  Surgeon: Alexis Frock, MD;  Location: [facility];  Service: Urology;  Laterality: N/A;   WISDOM TOOTH EXTRACTION     Social History:  reports that he has been smoking cigarettes. He has a 60.00 pack-year smoking history. He has never used smokeless tobacco. He reports that he does not drink alcohol and does not use drugs.  Allergies  Allergen Reactions   Penicillins Swelling    Has patient had a PCN reaction causing immediate rash, facial/tongue/throat swelling, SOB or lightheadedness with hypotension: Yes Has patient had a PCN reaction causing severe rash involving mucus membranes or skin necrosis: Yes Has patient had a PCN reaction that required hospitalization: No Has patient had a PCN reaction occurring within the last 10 years: No If all of the above answers are "NO", then may proceed with Cephalosporin use. ARM SWELLED    Family History  Problem Relation Age of Onset   Colon cancer Neg Hx     Prior to Admission medications   Medication Sig Start Date End Date  Taking? Authorizing Provider  citalopram (CELEXA) 20 MG tablet TAKE 1 TABLET BY MOUTH DAILY APPT REQUIRED FOR FUTURE REFILLS 05/29/21   Nilda Simmer, NP  fluticasone [facility]) 50 MCG/ACT nasal spray SHAKE LIQUID AND USE 2 SPRAYS IN EACH NOSTRIL DAILY 11/28/20   Elvia Collum M, DO  Multiple Vitamins-Minerals (MULTIVITAMIN WITH MINERALS) tablet Take 1 tablet by mouth daily.    [provider]    Physical Exam: Vitals:   08/26/21 0400 08/26/21 0500 08/26/21 0530 08/26/21 0600  BP: 113/81 101/69 117/73 110/74  Pulse: 74 80 90 91  Resp: (!) 28   (!) 24  Temp: 98.1 F (36.7 C)     SpO2: 91% 94% 92% (!) 89%   Physical Exam Vitals and nursing note reviewed.  Constitutional:      General: He is not in acute distress.    Appearance: He is normal weight. He is ill-appearing. He is not toxic-appearing or diaphoretic.  Cardiovascular:     Rate and Rhythm: Normal rate and regular rhythm.     Heart sounds: Normal heart sounds.  Pulmonary:     Effort: Pulmonary effort is normal. No respiratory distress.     Breath sounds: Normal breath sounds. No stridor. No wheezing or rales.  Abdominal:  General: Abdomen is flat. There is no abdominal bruit. There are no signs of injury.     Palpations: Abdomen is soft.     Tenderness: There is generalized abdominal tenderness and tenderness in the epigastric area, periumbilical area and left upper quadrant.     Hernia: A hernia is present. Hernia is present in the umbilical area.  Musculoskeletal:        General: Normal range of motion.  Skin:    General: Skin is warm and dry.  Neurological:     General: No focal deficit present.     Mental Status: He is alert.  Psychiatric:        Mood and Affect: Mood normal.     Data Reviewed:  Results for orders placed or performed during the hospital encounter of 08/26/21  Resp Panel by RT-PCR (Flu A&B, Covid) Nasopharyngeal Swab   Specimen: Nasopharyngeal Swab; Nasopharyngeal(NP) swabs in  vial transport medium  Result Value Ref Range   SARS Coronavirus 2 by RT PCR NEGATIVE NEGATIVE   Influenza A by PCR NEGATIVE NEGATIVE   Influenza B by PCR NEGATIVE NEGATIVE  Comprehensive metabolic panel  Result Value Ref Range   Sodium 135 135 - 145 mmol/L   Potassium 4.3 3.5 - 5.1 mmol/L   Chloride 99 98 - 111 mmol/L   CO2 23 22 - 32 mmol/L   Glucose, Bld 135 (H) 70 - 99 mg/dL   BUN 28 (H) 8 - 23 mg/dL   Creatinine, Ser 1.44 (H) 0.61 - 1.24 mg/dL   Calcium 9.3 8.9 - 10.3 mg/dL   Total Protein 7.9 6.5 - 8.1 g/dL   Albumin 4.2 3.5 - 5.0 g/dL   AST 21 15 - 41 U/L   ALT 15 0 - 44 U/L   Alkaline Phosphatase 84 38 - 126 U/L   Total Bilirubin 0.5 0.3 - 1.2 mg/dL   GFR, Estimated 55 (L) >60 mL/min   Anion gap 13 5 - 15  Lipase, blood  Result Value Ref Range   Lipase 61 (H) 11 - 51 U/L  CBC with Differential  Result Value Ref Range   WBC 10.8 (H) 4.0 - 10.5 K/uL   RBC 5.15 4.22 - 5.81 MIL/uL   Hemoglobin 15.7 13.0 - 17.0 g/dL   HCT 49.1 39.0 - 52.0 %   MCV 95.3 80.0 - 100.0 fL   MCH 30.5 26.0 - 34.0 pg   MCHC 32.0 30.0 - 36.0 g/dL   RDW 12.5 11.5 - 15.5 %   Platelets 431 (H) 150 - 400 K/uL   nRBC 0.0 0.0 - 0.2 %   Neutrophils Relative % 49 %   Neutro Abs 5.4 1.7 - 7.7 K/uL   Lymphocytes Relative 42 %   Lymphs Abs 4.6 (H) 0.7 - 4.0 K/uL   Monocytes Relative 6 %   Monocytes Absolute 0.6 0.1 - 1.0 K/uL   Eosinophils Relative 1 %   Eosinophils Absolute 0.1 0.0 - 0.5 K/uL   Basophils Relative 1 %   Basophils Absolute 0.1 0.0 - 0.1 K/uL   Immature Granulocytes 1 %   Abs Immature Granulocytes 0.06 0.00 - 0.07 K/uL  Troponin I (High Sensitivity)  Result Value Ref Range   Troponin I (High Sensitivity) 5 <18 ng/L  Troponin I (High Sensitivity)  Result Value Ref Range   Troponin I (High Sensitivity) 4 <18 ng/L    Assessment and Plan: * Perforated bowel ([place])- (present on admission) Surgery team consulted Keep NPO IV pain management IV fluids Check  lactic acid Exp lap  planned for later today   Pneumoperitoneum- (present on admission) Pt going to OR today.   Sudden onset of severe abdominal pain- (present on admission) IV pain management pending.  Pt to OR later today for exp lap.   CKD (chronic kidney disease) stage 3, GFR 30-59 ml/min (HCC)- (present on admission) Stable from prior tests.    History of renal pelvis cancer S/p left nephroureterectomy in 2020 by Dr. Tresa Moore  History of depression Stable on home meds.   Leukocytosis- (present on admission) Likely reactive to acute presentation of bowel perforation  Smoker- (present on admission) Nicotine patch ordered  Advance Care Planning:   Code Status: Full Code   Consults: surgery   Family Communication: 08/26/2021   Severity of Illness: The appropriate patient status for this patient is INPATIENT. Inpatient status is judged to be reasonable and necessary in order to provide the required intensity of service to ensure the patient's safety. The patient's presenting symptoms, physical exam findings, and initial radiographic and laboratory data in the context of their chronic comorbidities is felt to place them at high risk for further clinical deterioration. Furthermore, it is not anticipated that the patient will be medically stable for discharge from the hospital within 2 midnights of admission.   * I certify that at the point of admission it is my clinical judgment that the patient will require inpatient hospital care spanning beyond 2 midnights from the point of admission due to high intensity of service, high risk for further deterioration and high frequency of surveillance required.*   CRITICAL CARE Performed by: Irwin Brakeman   Total critical care time: 65 minutes  Critical care time was exclusive of separately billable procedures and treating other patients.  Critical care was necessary to treat or prevent imminent or life-threatening deterioration.  Critical care was time  spent personally by me on the following activities: development of treatment plan with patient and/or surrogate as well as nursing, discussions with consultants, evaluation of patient's response to treatment, examination of patient, obtaining history from patient or surrogate, ordering and performing treatments and interventions, ordering and review of laboratory studies, ordering and review of radiographic studies, pulse oximetry and re-evaluation of patient's condition.   Author: Irwin Brakeman, MD 08/26/2021 7:44 AM  For on call review [email or web address].

## 2021-08-26 NOTE — Progress Notes (Signed)
Date and time results received: 08/26/21  (use smartphrase ".now" to insert current time)  Test: Aerobic Bottle Gram positive cocci Critical Value: Positive  Name of Provider Notified:  Dr. Alford Highland  Orders Received? Or Actions Taken?: pt already on Antibiotics

## 2021-08-26 NOTE — Anesthesia Preprocedure Evaluation (Signed)
Anesthesia Evaluation  Patient identified by MRN, date of birth, ID band Patient awake    Reviewed: Allergy & Precautions, H&P , NPO status , Patient's Chart, lab work & pertinent test results, reviewed documented beta blocker date and time   History of Anesthesia Complications (+) PONV and history of anesthetic complications  Airway Mallampati: II  TM Distance: >3 FB Neck ROM: full    Dental no notable dental hx.    Pulmonary neg pulmonary ROS, Current Smoker,    Pulmonary exam normal breath sounds clear to auscultation       Cardiovascular Exercise Tolerance: Good negative cardio ROS   Rhythm:regular Rate:Normal     Neuro/Psych PSYCHIATRIC DISORDERS Depression negative neurological ROS     GI/Hepatic negative GI ROS, Neg liver ROS,   Endo/Other  negative endocrine ROS  Renal/GU CRFRenal disease  negative genitourinary   Musculoskeletal   Abdominal   Peds  Hematology negative hematology ROS (+)   Anesthesia Other Findings   Reproductive/Obstetrics negative OB ROS                             Anesthesia Physical Anesthesia Plan  ASA: 4 and emergent  Anesthesia Plan: General and General ETT   Post-op Pain Management:    Induction:   PONV Risk Score and Plan: Ondansetron  Airway Management Planned:   Additional Equipment:   Intra-op Plan:   Post-operative Plan: Possible Post-op intubation/ventilation  Informed Consent: I have reviewed the patients History and Physical, chart, labs and discussed the procedure including the risks, benefits and alternatives for the proposed anesthesia with the patient or authorized representative who has indicated his/her understanding and acceptance.     Dental Advisory Given  Plan Discussed with: CRNA  Anesthesia Plan Comments:         Anesthesia Quick Evaluation

## 2021-08-26 NOTE — Assessment & Plan Note (Signed)
S/p left nephroureterectomy in 2020 by Dr. Tresa Moore

## 2021-08-26 NOTE — Anesthesia Procedure Notes (Signed)
Procedure Name: Intubation Date/Time: 08/26/2021 8:24 AM Performed by: Louann Sjogren, MD Pre-anesthesia Checklist: Patient identified, Patient being monitored, Timeout performed, Emergency Drugs available and Suction available Patient Re-evaluated:Patient Re-evaluated prior to induction Oxygen Delivery Method: Circle System Utilized Preoxygenation: Pre-oxygenation with 100% oxygen Induction Type: IV induction, Rapid sequence and Cricoid Pressure applied Ventilation: Mask ventilation without difficulty Laryngoscope Size: Mac and 3 Grade View: Grade II Tube type: Oral Tube size: 7.0 mm Number of attempts: 1 Airway Equipment and Method: Stylet Placement Confirmation: ETT inserted through vocal cords under direct vision, positive ETCO2 and breath sounds checked- equal and bilateral Secured at: 21 cm Tube secured with: Tape Dental Injury: Teeth and Oropharynx as per pre-operative assessment

## 2021-08-26 NOTE — Assessment & Plan Note (Addendum)
Creatinine trended down to approximately 1.

## 2021-08-26 NOTE — ED Triage Notes (Signed)
Pt arrived via POV w c/o epi gastric pain 10/10. Pt states that the pain woke him from sleep

## 2021-08-26 NOTE — Assessment & Plan Note (Addendum)
Surgery team consulted IV pain management IV fluids reduced to [facility].  POD #5 s/p Exp lap on 2/18 Upper GI study 2/20 reassuring and NG removed and clears started Tolerating soft diet. Okay for discharge with outpatient general surgery follow-up on 3/7.

## 2021-08-26 NOTE — Assessment & Plan Note (Addendum)
Pt is postop day #5  s/p gastroduodenal repair.  Upper GI study on 2/20 reassuring and diet started.  Tolerating soft diet.

## 2021-08-26 NOTE — Progress Notes (Signed)
Perforated viscous. Spoke to surgeon, and she would like him admitted to the hospitalist. History of hyperlipidemia, bladder cancer and more. Plan is for ex lap today. This is a carry over.

## 2021-08-26 NOTE — Op Note (Signed)
[facility] Operative Note  08/26/21  Preoperative Diagnosis: Pneumoperitoneum   Postoperative Diagnosis: Perforated gastroduodenal ulcer, incarcerated incisional hernia   Procedure(s) Performed: Exploratory laparotomy with gastrorrhaphy and Phillip Heal patch repair, repair of incarcerated incisional hernia   Surgeon: Graciella Freer, DO    Assistants: Aviva Signs, MD   Anesthesia: General endotracheal   Anesthesiologist: Mertha Finders, MD   Specimens: Hernia sac   Estimated Blood Loss: 25 cc   Blood Replacement: None    Complications: None   Wound Class: Contaminated   Operative Indications: Patient is a 66 year old male who presented to the hospital with a few hour history of worsening abdominal pain.  He underwent a CT abdomen and pelvis which demonstrated pneumoperitoneum with inflammation of the gastroduodenal junction and an incarcerated supraumbilical hernia containing transverse mesocolon.  Given imaging findings and patient presentation, decision was made to take the patient to the operating room for exploratory laparotomy with possible Phillip Heal patch repair, possible bowel resection, possible ostomy.  All risks, benefits, and alternatives to exploratory laparotomy with possible Phillip Heal patch, possible bowel resection, possible ostomy were discussed with the patient and his family, all of their questions were answered to their expressed satisfaction. The patient expresses he wishes to proceed, and informed consent was obtained.  Findings: Perforation of the gastroduodenal ulcer, incarcerated incisional hernia containing omentum (2 cm in size)   Procedure: The patient was taken to the operating room and placed supine. General endotracheal anesthesia was induced. Intravenous antibiotics were administered per protocol.  An nasogastric tube positioned to decompress the stomach.  Foley was placed.  The abdomen was prepared and draped in the usual sterile  fashion.  Timeout was performed.  The vertical midline incision was made from the xiphoid to just above the umbilicus.  This was deepened through the subcutaneous tissues with hemostasis achieved by electrocautery.  The linea alba was identified and incised, and the peritoneal cavity was entered.  The abdomen was entered at the superior aspect of the incision, and an incarcerated incisional hernia at the inferior aspect of the incision was reduced.  The hernia was noted to be 2 cm in size.  Adhesions to the abdominal wall were taken down, and the incision was fully opened.  Upon entering the abdomen (organ space), I encountered inflammation of the gastroduodenal junction with spillage of gastric contents.  Culture was obtained of the peritoneal fluid.  The falciform ligament was taken down using LigaSure, and an Vanderbilt wound protector was inserted.  There was no evidence of malignancy noted on exploration of the abdomen.  The stomach and duodenum were inspected and palpated, and a small (<1 cm) anterior perforation of a gastroduodenal ulcer was found.  Four interrupted sutures of 2-0 silk were placed through healthy duodenal tissue in such a fashion to span the perforation.  These were left untied for now.  A viable tongue of omentum was created using LigaSure, and was brought up and placed over the perforation.  It was secured in place by tying the previously placed sutures over it in such a manner as to completely close the hole in the duodenum.  Care was taken to avoid excess tension.  The transverse colon was inspected given its close proximity to the incarcerated incisional hernia, and there was no evidence of perforation or inflammation noted.  Hemostasis was checked.  The position of the NG tube was verified.  The abdomen was then copiously irrigated with warm saline until the fluid returned clear.  The hernia sac was excised  and sent to pathology for evaluation. The fascia was closed in a running fashion  with 0 looped PDS from the superior and inferior aspects of the incision.  The skin was irrigated.  The skin was then closed with skin staples. A sterile dressing was applied.  Dr. Arnoldo Morale was assisting throughout the procedure and was present for the critical portions of the case.   All counts were correct at the end of the case. The patient was awakened from anesthesia and extubated without complication.  The patient went to the PACU in stable condition.   Graciella Freer, DO  [facility] [address] [place], [place] [address] [phone] (office)

## 2021-08-26 NOTE — Assessment & Plan Note (Addendum)
Pain is well managed on oral medications Continue Tylenol as scheduled with oxycodone as needed

## 2021-08-26 NOTE — Transfer of Care (Signed)
Immediate Anesthesia Transfer of Care Note  Patient: Michael Brady  Procedure(s) Performed: EXPLORATORY LAPAROTOMY GASTRORRHAPHY  Patient Location: PACU  Anesthesia Type:General  Level of Consciousness: awake and drowsy  Airway & Oxygen Therapy: Patient Spontanous Breathing  Post-op Assessment: Report given to RN and Post -op Vital signs reviewed and stable  Post vital signs: Reviewed and stable  Last Vitals:  Vitals Value Taken Time  BP 157/78 08/26/21 0945  Temp    Pulse    Resp 28 08/26/21 0945  SpO2    Vitals shown include unvalidated device data.  Last Pain:  Vitals:   08/26/21 0709  PainSc: 10-Worst pain ever         Complications: No notable events documented.

## 2021-08-26 NOTE — Hospital Course (Addendum)
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  There was no SOB. No nausea, vomiting or diarrhea.  This was a severe frightening pain that he has never experienced and it continues.  Any movement exacerbates the pain.  He has not been able to eat or drink. He has been evaluated in the ED his EKG did not show acute changes.  He had a mild leukocytosis and mild lipase elevation.  CT scan shows perforated viscus with free air present and an incarcerated umbilical hernia with inflammatory changes around the gastroduodenal junction.  Pt was started on IV protonix and IV antibiotics and IV fluids.  Surgery consulted and requested medical admission and plan is for patient to go for exploratory lap later today.    08/27/2021:  POD #1 - Pt having some pain, but controlled with medication.  No BS. NG remains in place.  Surgery planning for upper GI study on 2/20 to evaluate repair.   08/28/2021: POD #2 - Pt continues to feel better.  Abd pain improving. No BM. Bowel sounds heard.  Upper GI study ordered today by surgery team.  NG remain in place.    08/29/2021: POD #3 - NGT out since 2/20. Tolerating clears.  No bowel movement yet. Pain improving.  WBC coming down.    08/30/2021: POD #4-diet advanced to soft today and weaning off oxygen.  PT to evaluate patient to determine safety on discharge.  Anticipate discharge in a.m.   09/01/2031: POD #5-tolerating dietary advancement and has weaned off oxygen.  PT recommends outpatient physical therapy as needed, otherwise doing well.  He will be discharged on treatment for H. pylori as noted as well as some pain medications as needed and close follow-up with general surgery on  3/7.

## 2021-08-26 NOTE — Assessment & Plan Note (Signed)
Stable on home meds.

## 2021-08-26 NOTE — Progress Notes (Signed)
Update Note:  Called to update the patient's wife, Michael Brady, to update her on the surgery.  It was explained that he had a gastroduodenal ulcer perforation, and that this area was repaired.  It was further explained that his incisional hernia was taken down and also repaired primarily.  He did well during the surgery and was hemodynamically stable throughout.  He was also able to be extubated with plan for transfer to the floor.  All of her questions were answered to her expressed satisfaction.  Plan: -Maintain NG to LIS -NPO -IVF -Continue Rocephin, Flagyl, and Diflucan -Await peritoneal fluid cultures -PRN pain medications and antiemetics -Plan for upper GI study on either Monday or Tuesday pending patient's clinical course to evaluate for adequacy of repair  Graciella Freer, DO [facility] [address] [place], [place] [address] [phone] (office)

## 2021-08-26 NOTE — Progress Notes (Signed)
Prior to transport to room 334, patient able to to move self from stretcher to hospital bed, Tolerated well then transported to floor with help from 300 RN.

## 2021-08-26 NOTE — ED Provider Notes (Signed)
[facility] Provider Note   CSN: [id number] Arrival date & time: 08/26/21  0250     History  Chief Complaint  Patient presents with   Abdominal Pain    Michael Brady is a 66 y.o. male.  The history is provided by the patient.  Abdominal Pain He has history of chronic kidney disease, hyperlipidemia, bladder cancer and comes in after being awakened by severe epigastric/lower chest pain without radiation.  There is associated nausea but no vomiting.  He denies any dyspnea or diaphoresis.  He has never had pain like this before.  He has not anything to treat it.  Nothing makes it better, nothing makes it worse.  He had not eaten anything unusual and denies any alcoholic ingestion.  He is a cigarette smoker but denies history of hypertension or diabetes.   Home Medications Prior to Admission medications   Medication Sig Start Date End Date Taking? Authorizing Provider  citalopram (CELEXA) 20 MG tablet TAKE 1 TABLET BY MOUTH DAILY APPT REQUIRED FOR FUTURE REFILLS 05/29/21   Nilda Simmer, NP  fluticasone [facility]) 50 MCG/ACT nasal spray SHAKE LIQUID AND USE 2 SPRAYS IN EACH NOSTRIL DAILY 11/28/20   Elvia Collum M, DO  Multiple Vitamins-Minerals (MULTIVITAMIN WITH MINERALS) tablet Take 1 tablet by mouth daily.    [provider]      Allergies    Penicillins    Review of Systems   Review of Systems  Gastrointestinal:  Positive for abdominal pain.  All other systems reviewed and are negative.  Physical Exam Updated Vital Signs BP 110/74    Pulse 91    Temp 98.1 F (36.7 C)    Resp (!) 24    SpO2 94%  Physical Exam Vitals and nursing note reviewed.  66 year old male, resting comfortably and in no acute distress. Vital signs are significant for elevated respiratory rate. Oxygen saturation is 94%, which is normal. Head is normocephalic and atraumatic. PERRLA, EOMI. Oropharynx is clear. Neck is nontender and supple without adenopathy or JVD. Back  is nontender and there is no CVA tenderness. Lungs are clear without rales, wheezes, or rhonchi. Chest is nontender. Heart has regular rate and rhythm without murmur. Abdomen is soft, flat, with moderate epigastric and right upper quadrant tenderness and a +/- Murphy sign.  There are no masses or hepatosplenomegaly and peristalsis is hypoactive. Extremities have no cyanosis or edema, full range of motion is present. Skin is warm and dry without rash. Neurologic: Mental status is normal, cranial nerves are intact, moves all extremities equally.  ED Results / Procedures / Treatments   Labs (all labs ordered are listed, but only abnormal results are displayed) Labs Reviewed  COMPREHENSIVE METABOLIC PANEL - Abnormal; Notable for the following components:      Result Value   Glucose, Bld 135 (*)    BUN 28 (*)    Creatinine, Ser 1.44 (*)    GFR, Estimated 55 (*)    All other components within normal limits  LIPASE, BLOOD - Abnormal; Notable for the following components:   Lipase 61 (*)    All other components within normal limits  CBC WITH DIFFERENTIAL/PLATELET - Abnormal; Notable for the following components:   WBC 10.8 (*)    Platelets 431 (*)    Lymphs Abs 4.6 (*)    All other components within normal limits  RESP PANEL BY RT-PCR (FLU A&B, COVID) ARPGX2  TROPONIN I (HIGH SENSITIVITY)  TROPONIN I (HIGH SENSITIVITY)  EKG EKG Interpretation  Date/Time:  Saturday August 26 2021 03:42:02 EST Ventricular Rate:  79 PR Interval:  141 QRS Duration: 96 QT Interval:  378 QTC Calculation: 434 R Axis:   82 Text Interpretation: Sinus rhythm Borderline right axis deviation Low voltage, extremity and precordial leads Baseline wander in lead(s) II III aVL aVF V1 V3 V4 V5 V6 When compared with ECG of 03/15/2020, No significant change was found Confirmed by Delora Fuel ([id number]) on 08/26/2021 3:58:41 AM  Radiology CT ABDOMEN PELVIS W CONTRAST  Result Date: 08/26/2021 CLINICAL DATA:   66 year old male with acute 10/10 epigastric abdominal pain. Pancreatitis suspected. EXAM: CT ABDOMEN AND PELVIS WITH CONTRAST TECHNIQUE: Multidetector CT imaging of the abdomen and pelvis was performed using the standard protocol following bolus administration of intravenous contrast. RADIATION DOSE REDUCTION: This exam was performed according to the departmental dose-optimization program which includes automated exposure control, adjustment of the mA and/or kV according to patient size and/or use of iterative reconstruction technique. CONTRAST:  42mL OMNIPAQUE IOHEXOL 300 MG/ML  SOLN COMPARISON:  CT Abdomen and Pelvis [facility] 06/12/2019 and earlier. FINDINGS: Lower chest: Negative. No pericardial or pleural effusion. Hepatobiliary: Small volume pneumoperitoneum and free fluid around the liver. Mildly complex fluid density. Liver enhancement maintained. Gallbladder within normal limits. No bile duct enlargement. Pancreas: Within normal limits. Spleen: Small volume perisplenic fluid. Otherwise negative. Adrenals/Urinary Tract: Chronically absent left kidney. Stable postoperative appearance of the left renal fossa since 2020. Normal adrenal glands. Nonobstructed solitary right kidney. Normal right renal contrast excretion. Diminutive bladder. Stomach/Bowel: Severe diverticulosis of the descending and sigmoid colon, bowel walls of those segments appear highly indistinct. Small volume pneumoperitoneum under the diaphragm and situated adjacent to the liver. No retroperitoneal gas is identified. Complex fluid layering in both pericolic gutters. Small volume free fluid elsewhere in the abdomen. Superimposed chronic but enlarged and inflamed appearing umbilical hernia containing transverse mesocolon (series 2, image 40). No free air within the hernia, but underlying transverse colon diverticula and irregular bowel wall appearance there also. Additionally, there is a large gas containing 6.4 cm  diverticulum versus contained perforation immediately subjacent to the herniated mesentery (coronal image 64) which is in proximity to both the transverse colon and inflamed gastroduodenal junction. Axial images however suggest this communicates with the transverse colon (series 2 images 35 through 39). Right: Mostly decompressed and fluid containing. No superimposed dilated small bowel. Small gastric hiatal hernia, and small volume of pneumoperitoneum in that hernia sac. Inflamed appearance of both the gastric antrum and the duodenal C loop as seen on coronal image 38. Duodenum is decompressed. The distal duodenum and ligament of Treitz have a more normal appearance. No other inflamed small bowel loops identified. Cecum is located in the midline, and midline appendix appears normal on coronal image 49. Vascular/Lymphatic: Widespread Aortoiliac calcified atherosclerosis. Tortuous infrarenal abdominal aorta, but no infrarenal aneurysm considering less than 1.5 times the normal caliber. Major arterial structures remain patent. No lymphadenopathy identified. Portal venous system patent. Reproductive: Negative. Other: Complex free fluid in the pelvis, moderate to large volume. Musculoskeletal: Chronic grade 1 spondylolisthesis at L4-L5. No acute osseous abnormality identified. IMPRESSION: 1. Positive for Acutely Perforated Bowel. In the epigastrium both a transverse mesocolon containing umbilical hernia and the gastroduodenal junction appear acutely inflamed. Just deep to the ventral abdominal hernia a 6.4 cm pocket of gas could be a giant diverticulum, or perforation site, and is favored to arise from the Transverse Colon. But the possibility of a perforated peptic ulcer disease  remains. 2. Associated pneumoperitoneum and mildly complex free fluid in the abdomen and pelvis. 3. Superimposed severe diverticulosis of both the descending and sigmoid colon - both of which have indistinct bowel walls, but an epigastric  source of perforation is favored as in #1. 4. No bowel obstruction.  No evidence of pancreatitis. 5. Chronically left nephrectomy. Advanced Aortic Atherosclerosis (ICD10-I70.0), tortuous aorta. Study discussed by telephone with Dr. Delora Fuel on 12/13/3708 at 05:56 . Electronically Signed   By: Genevie Ann M.D.   On: 08/26/2021 06:05    Procedures Procedures  Cardiac monitor, per my interpretation, shows normal sinus rhythm.  Medications Ordered in ED Medications  cefTRIAXone (ROCEPHIN) 2 g in sodium chloride 0.9 % 100 mL IVPB (has no administration in time range)  metroNIDAZOLE (FLAGYL) IVPB 500 mg (has no administration in time range)  morphine (PF) 4 MG/ML injection 4 mg (has no administration in time range)  ondansetron (ZOFRAN) injection 4 mg (4 mg Intravenous Given 08/26/21 0340)  morphine (PF) 4 MG/ML injection 4 mg (4 mg Intravenous Given 08/26/21 0340)  lactated ringers bolus 1,000 mL (1,000 mLs Intravenous New Bag/Given 08/26/21 0339)  morphine (PF) 4 MG/ML injection 4 mg (4 mg Intravenous Given 08/26/21 0452)  iohexol (OMNIPAQUE) 300 MG/ML solution 75 mL (75 mLs Intravenous Contrast Given 08/26/21 0511)    ED Course/ Medical Decision Making/ A&P                           Medical Decision Making Amount and/or Complexity of Data Reviewed Labs: ordered. Radiology: ordered.  Risk Prescription drug management.   Epigastric/chest pain.  Differential is broad and includes multiple conditions with significant morbidity and mortality.  Need to rule out ACS, consider biliary colic, peptic ulcer disease, pancreatitis, diverticulitis, abdominal aneurysm.  Old records are reviewed, and he has no relevant past visits, no prior abdominal imaging.  He will be given IV fluids, morphine for pain, ondansetron for nausea.  We will check screening labs including lipase and troponin.  Will check ECG.  ECG shows no acute changes.  Labs show mild renal insufficiency not significantly changed from baseline,  mildly elevated lipase of uncertain significance, minimal leukocytosis without a left shift.  In setting of severe epigastric pain, elevated lipase is concerning for possible underlying pancreatitis.  He is sent for CT of abdomen and pelvis.  CT scan shows evidence of perforated viscus with free air present.  There is an incarcerated umbilical hernia with inflammatory changes around it, and also inflammatory changes around the gastroduodenal junction.  I have independently viewed the images, and discussed the case extensively with the radiologist, and I agree with this interpretation.  Intravenous antibiotics were ordered.  Because of concern for possible peptic ulcer disease, he is started on intravenous pantoprazole.  Case is discussed with Dr. Okey Dupre, on-call for general surgery.  I have discussed the radiology findings and labs with her and she will come to take the patient to the operating room but requests admission to the hospitalist service.  Case is discussed with Dr. Clearence Ped of [facility], who agrees to admit the patient.  Patient has required additional doses of morphine, and is starting to have borderline low oxygen saturations of 89% on room air.  He is given additional morphine and is placed on supplemental nasal oxygen.  CRITICAL CARE Performed by: Delora Fuel Total critical care time: 50 minutes Critical care time was exclusive of separately billable procedures and treating other patients.  Critical care was necessary to treat or prevent imminent or life-threatening deterioration. Critical care was time spent personally by me on the following activities: development of treatment plan with patient and/or surrogate as well as nursing, discussions with consultants, evaluation of patient's response to treatment, examination of patient, obtaining history from patient or surrogate, ordering and performing treatments and interventions, ordering and review of laboratory studies,  ordering and review of radiographic studies, pulse oximetry and re-evaluation of patient's condition.       Final Clinical Impression(s) / ED Diagnoses Final diagnoses:  Pneumoperitoneum  Renal insufficiency    Rx / DC Orders ED Discharge Orders     None         Delora Fuel, MD 49/20/10 [phone]

## 2021-08-26 NOTE — Brief Op Note (Signed)
08/26/2021  9:52 AM  PATIENT:  Michael Brady  66 y.o. male  PRE-OPERATIVE DIAGNOSIS:  Pneumoperitoneum  POST-OPERATIVE DIAGNOSIS:  PERFORATED GASTRODUODENAL JUNCTION, INCARCERATED HERNIA   PROCEDURE:  Procedure(s): EXPLORATORY LAPAROTOMY (N/A) GASTRORRHAPHY WITH GRAHAM PATCH REPAIR (N/A) HERNIA REPAIR INCISIONAL (Right)  SURGEON:  Surgeon(s) and Role:    * Paulla Mcclaskey, Flint Melter, DO - Primary    * Aviva Signs, MD - Assisting  PHYSICIAN ASSISTANT: Aviva Signs, MD  ASSISTANTS: none   ANESTHESIA:   general  EBL:  25 mL   BLOOD ADMINISTERED:none  DRAINS: none   LOCAL MEDICATIONS USED:  MARCAINE     SPECIMEN:  Source of Specimen:  hernia sac  DISPOSITION OF SPECIMEN:  PATHOLOGY  COUNTS:  YES  DICTATION: .Note written in EPIC  PLAN OF CARE: Admit to inpatient   PATIENT DISPOSITION:  PACU - hemodynamically stable.

## 2021-08-26 NOTE — Assessment & Plan Note (Addendum)
Nicotine patch ordered Counseled on urgent need for tobacco cessation at bedside, pt verbalized understanding

## 2021-08-26 NOTE — Consult Note (Signed)
[facility] Consult  Reason for Consult: Pneumoperitoneum Referring Physician: Delora Fuel, MD  Chief Complaint   Abdominal Pain     HPI: Michael Brady is a 66 y.o. male who presents for a few hour history of abdominal pain that awoke him from sleep.  He had a similar pain a couple of days last week, and the pain resolved on its own.  He described the pain as being in the epigastric region and it did not radiate anywhere.  He then started having the same pain that awoke him from sleep last night which prompted him to come to the emergency department.  He again denies any radiation of the pain.  He took two 81 mg aspirins to try and help alleviate his pain without significant help.  He denies any nausea or vomiting.  He last had a bowel movement yesterday evening, and it was normal for him.  He denies fever and chills.  He denies use of PPIs in the past, and has never undergone an EGD.  In the emergency department he underwent a CT abdomen and pelvis which demonstrates pneumoperitoneum with inflammation of the gastroduodenal junction in addition to an incarcerated incisional hernia which is in close proximity to the transverse colon, pocket of air possibly communicating with transverse colon.  He has a very mild leukocytosis of 10.8.  He has an AKI, though he only has a right kidney, as he underwent a nephrectomy in 2019.  He has a history of left kidney cancer.  He denies use of blood thinning medications other than the aspirin he took this morning.  He smokes 1.5 packs/day and has done so since the age of 26.  He denies use of alcohol or illicit drugs.  Past Medical History:  Diagnosis Date   Cancer ([place])    left renal pelvis cnacer-12/05/2017   Diverticulosis of colon    NO ISSUES IN OVER 12 YEARS    Dysuria    Hematuria    10-11-17: REPORTS I "NOTICED SOME BLOOD IN MY URINE TODAY AFTER NOT SEEING ANY SINCE MY SURGERY IN [place] . I JUST HAD MY BLOOD DRAWN AT [facility] THIS LAST  MONDAY BUT THEY [place] ME ABOUT IT YET"   History of closed dislocation of shoulder 06/2014   LEFT --- POST IV SEDATION CLOSED REDUCTION IN ED   PONV (postoperative nausea and vomiting)    Renal mass, left    LEFT RENAL COLLECTING SYSTEM MASS   Smokers' cough ([place])    [place]   Wears dentures    Wears glasses     Past Surgical History:  Procedure Laterality Date   CARPAL TUNNEL RELEASE Left 01-25-2006   dr Lorin [facility]   and LEFT THUMB PULLEY RELEASE   COLONOSCOPY N/A 05/07/2017   Procedure: COLONOSCOPY;  Surgeon: Aviva Signs, MD;  [facility];  Service: Gastroenterology;  Laterality: N/A;   CYSTOSCOPY W/ RETROGRADES Right 07/25/2018   Procedure: CYSTOSCOPY WITH RETROGRADE PYELOGRAM;  Surgeon: Alexis Frock, MD;  Location: [facility];  Service: Urology;  Laterality: Right;   CYSTOSCOPY WITH URETEROSCOPY AND STENT PLACEMENT Left 07/29/2017   Procedure: CYSTOSCOPY WITH URETEROSCOPY AND STENT PLACEMENT;  Surgeon: [name], MD;  Location: [facility];  Service: Urology;  Laterality: Left;   CYSTOSCOPY/URETEROSCOPY/HOLMIUM LASER/STENT PLACEMENT Left 07/15/2017   Procedure: CYSTOSCOPY, ATTEMTED URETEROSCOPY/,STENT PLACEMENT;  Surgeon: [name], MD;  Location: [facility];  Service: Urology;  Laterality: Left;   CYSTOSCOPY/URETEROSCOPY/HOLMIUM  LASER/STENT PLACEMENT Left 10/21/2017   Procedure: CYSTOSCOPY, RETROGRADE /URETEROSCOPY LEFT UPPER TRACT,BIOPSY, [name] LASER/STENT PLACEMENT;  Surgeon: [name], MD;  Location: [facility];  Service: Urology;  Laterality: Left;   HOLMIUM LASER APPLICATION Left 8/58/8502   Procedure: HOLMIUM LASER APPLICATION;  Surgeon: [name], MD;  Location: [facility];  Service: Urology;  Laterality: Left;   ROBOT ASSITED LAPAROSCOPIC NEPHROURETERECTOMY Left 12/11/2017   Procedure: XI ROBOT ASSITED LAPAROSCOPIC NEPHROURETERECTOMY;   Surgeon: Alexis Frock, MD;  Location: [facility];  Service: Urology;  Laterality: Left;   SHOULDER SURGERY Right 1997   ROTATOR CUFF REPAIR    THULIUM LASER TURP (TRANSURETHRAL RESECTION OF PROSTATE) Left 07/29/2017   Procedure: CYSTOSCOPY, LEFT URETEROSCOPY WITH TUMOR BIOPSYTHULLIUM LASER ABLATION OF TUMOR AND LEFT URETERAL STENT EXCHANGE;  Surgeon: [name], MD;  Location: [facility];  Service: Urology;  Laterality: Left;   TRANSURETHRAL RESECTION OF BLADDER TUMOR N/A 07/25/2018   Procedure: TRANSURETHRAL RESECTION OF BLADDER TUMOR (TURBT);  Surgeon: Alexis Frock, MD;  Location: [facility];  Service: Urology;  Laterality: N/A;   WISDOM TOOTH EXTRACTION      Family History  Problem Relation Age of Onset   Colon cancer Neg Hx     Social History   Tobacco Use   Smoking status: Every Day    Packs/day: 1.50    Years: 40.00    Pack years: 60.00    Types: Cigarettes   Smokeless tobacco: Never  Vaping Use   Vaping Use: Never used  Substance Use Topics   Alcohol use: No   Drug use: No    Medications: I have reviewed the patient's current medications.  Allergies  Allergen Reactions   Penicillins Swelling    Has patient had a PCN reaction causing immediate rash, facial/tongue/throat swelling, SOB or lightheadedness with hypotension: Yes Has patient had a PCN reaction causing severe rash involving mucus membranes or skin necrosis: Yes Has patient had a PCN reaction that required hospitalization: No Has patient had a PCN reaction occurring within the last 10 years: No If all of the above answers are "NO", then may proceed with Cephalosporin use. ARM SWELLED     ROS:  Constitutional: negative for chills, fatigue, and fevers Respiratory: negative for cough, wheezing, and shortness of breath Cardiovascular: negative for chest pain and palpitations Gastrointestinal: positive for abdominal pain, negative for nausea and  vomiting Musculoskeletal:negative for back pain and neck pain  Blood pressure 110/74, pulse 91, temperature 98.1 F (36.7 C), resp. rate (!) 24, SpO2 (!) 89 %. Physical Exam Vitals reviewed.  Constitutional:      General: He is in acute distress.     Appearance: He is ill-appearing.  HENT:     Head: Normocephalic and atraumatic.  Cardiovascular:     Rate and Rhythm: Normal rate.  Pulmonary:     Effort: Pulmonary effort is normal.     Breath sounds: Normal breath sounds.  Abdominal:     Comments: Abdomen is soft, nondistended, percussion tenderness in epigastrium, tenderness to palpation in the epigastrium; voluntary guarding, no rigidity or rebound tenderness; midline periumbilical scar noted with underlying palpable hernia, unable to reduce secondary to patient discomfort  Skin:    General: Skin is warm and dry.  Neurological:     General: No focal deficit present.     Mental Status: He is alert.  Psychiatric:        Mood and Affect: Mood normal.  Behavior: Behavior normal.    Results: Results for orders placed or performed during the hospital encounter of 08/26/21 (from the past 48 hour(s))  Comprehensive metabolic panel     Status: Abnormal   Collection Time: 08/26/21  3:00 AM  Result Value Ref Range   Sodium 135 135 - 145 mmol/L   Potassium 4.3 3.5 - 5.1 mmol/L   Chloride 99 98 - 111 mmol/L   CO2 23 22 - 32 mmol/L   Glucose, Bld 135 (H) 70 - 99 mg/dL    Comment: Glucose reference range applies only to samples taken after fasting for at least 8 hours.   BUN 28 (H) 8 - 23 mg/dL   Creatinine, Ser 1.44 (H) 0.61 - 1.24 mg/dL   Calcium 9.3 8.9 - 10.3 mg/dL   Total Protein 7.9 6.5 - 8.1 g/dL   Albumin 4.2 3.5 - 5.0 g/dL   AST 21 15 - 41 U/L   ALT 15 0 - 44 U/L   Alkaline Phosphatase 84 38 - 126 U/L   Total Bilirubin 0.5 0.3 - 1.2 mg/dL   GFR, Estimated 55 (L) >60 mL/min    Comment: (NOTE) Calculated using the CKD-EPI Creatinine Equation (2021)    Anion gap 13 5  - 15    Comment: Performed at [facility], [address] [place], [place] [address]  Lipase, blood     Status: Abnormal   Collection Time: 08/26/21  3:00 AM  Result Value Ref Range   Lipase 61 (H) 11 - 51 U/L    Comment: Performed at [facility], [address] [place], [place] [address]  CBC with Differential     Status: Abnormal   Collection Time: 08/26/21  3:00 AM  Result Value Ref Range   WBC 10.8 (H) 4.0 - 10.5 K/uL   RBC 5.15 4.22 - 5.81 MIL/uL   Hemoglobin 15.7 13.0 - 17.0 g/dL   HCT 49.1 39.0 - 52.0 %   MCV 95.3 80.0 - 100.0 fL   MCH 30.5 26.0 - 34.0 pg   MCHC 32.0 30.0 - 36.0 g/dL   RDW 12.5 11.5 - 15.5 %   Platelets 431 (H) 150 - 400 K/uL   nRBC 0.0 0.0 - 0.2 %   Neutrophils Relative % 49 %   Neutro Abs 5.4 1.7 - 7.7 K/uL   Lymphocytes Relative 42 %   Lymphs Abs 4.6 (H) 0.7 - 4.0 K/uL   Monocytes Relative 6 %   Monocytes Absolute 0.6 0.1 - 1.0 K/uL   Eosinophils Relative 1 %   Eosinophils Absolute 0.1 0.0 - 0.5 K/uL   Basophils Relative 1 %   Basophils Absolute 0.1 0.0 - 0.1 K/uL   Immature Granulocytes 1 %   Abs Immature Granulocytes 0.06 0.00 - 0.07 K/uL    Comment: Performed at [facility], [address] [place], [place] [address]  Troponin I (High Sensitivity)     Status: None   Collection Time: 08/26/21  3:00 AM  Result Value Ref Range   Troponin I (High Sensitivity) 5 <18 ng/L    Comment: (NOTE) Elevated high sensitivity troponin I (hsTnI) values and significant  changes across serial measurements may suggest ACS but many other  chronic and acute conditions are known to elevate hsTnI results.  Refer to the "Links" section for chest pain algorithms and additional  guidance. Performed at [facility], [address] [place], [place] [address]   Troponin I (High Sensitivity)     Status: None   Collection Time:  08/26/21  5:59 AM  Result Value Ref Range   Troponin I (High Sensitivity) 4 <18 ng/L    Comment: (NOTE) Elevated high sensitivity troponin I  (hsTnI) values and significant  changes across serial measurements may suggest ACS but many other  chronic and acute conditions are known to elevate hsTnI results.  Refer to the "Links" section for chest pain algorithms and additional  guidance. Performed at [facility], [address] [place], [place] [address]   Resp Panel by RT-PCR (Flu A&B, Covid) Nasopharyngeal Swab     Status: None   Collection Time: 08/26/21  6:11 AM   Specimen: Nasopharyngeal Swab; Nasopharyngeal(NP) swabs in vial transport medium  Result Value Ref Range   SARS Coronavirus 2 by RT PCR NEGATIVE NEGATIVE    Comment: (NOTE) SARS-CoV-2 target nucleic acids are NOT DETECTED.  The SARS-CoV-2 RNA is generally detectable in upper respiratory specimens during the acute phase of infection. The lowest concentration of SARS-CoV-2 viral copies this assay can detect is 138 copies/mL. A negative result does not preclude SARS-Cov-2 infection and should not be used as the sole basis for treatment or other patient management decisions. A negative result may occur with  improper specimen collection/handling, submission of specimen other than nasopharyngeal swab, presence of viral mutation(s) within the areas targeted by this assay, and inadequate number of viral copies(<138 copies/mL). A negative result must be combined with clinical observations, patient history, and epidemiological information. The expected result is Negative.  Fact Sheet for Patients:  EntrepreneurPulse.com.au  Fact Sheet for Healthcare Providers:  IncredibleEmployment.be  This test is no t yet approved or cleared by the [place] FDA and  has been authorized for detection and/or diagnosis of SARS-CoV-2 by FDA under an Emergency Use Authorization (EUA). This EUA will remain  in effect (meaning this test can be used) for the duration of the COVID-19 declaration under Section 564(b)(1) of the Act, 21 U.S.C.section  360bbb-3(b)(1), unless the authorization is terminated  or revoked sooner.       Influenza A by PCR NEGATIVE NEGATIVE   Influenza B by PCR NEGATIVE NEGATIVE    Comment: (NOTE) The Xpert Xpress SARS-CoV-2/FLU/RSV plus assay is intended as an aid in the diagnosis of influenza from Nasopharyngeal swab specimens and should not be used as a sole basis for treatment. Nasal washings and aspirates are unacceptable for Xpert Xpress SARS-CoV-2/FLU/RSV testing.  Fact Sheet for Patients: EntrepreneurPulse.com.au  Fact Sheet for Healthcare Providers: IncredibleEmployment.be  This test is not yet approved or cleared by the [place] FDA and has been authorized for detection and/or diagnosis of SARS-CoV-2 by FDA under an Emergency Use Authorization (EUA). This EUA will remain in effect (meaning this test can be used) for the duration of the COVID-19 declaration under Section 564(b)(1) of the Act, 21 U.S.C. section 360bbb-3(b)(1), unless the authorization is terminated or revoked.  Performed at [facility], [address], [place], [place] [address]     CT ABDOMEN PELVIS W CONTRAST  Result Date: 08/26/2021 CLINICAL DATA:  66 year old male with acute 10/10 epigastric abdominal pain. Pancreatitis suspected. EXAM: CT ABDOMEN AND PELVIS WITH CONTRAST TECHNIQUE: Multidetector CT imaging of the abdomen and pelvis was performed using the standard protocol following bolus administration of intravenous contrast. RADIATION DOSE REDUCTION: This exam was performed according to the departmental dose-optimization program which includes automated exposure control, adjustment of the mA and/or kV according to patient size and/or use of iterative reconstruction technique. CONTRAST:  53mL OMNIPAQUE IOHEXOL 300 MG/ML  SOLN COMPARISON:  CT  Abdomen and Pelvis [facility] 06/12/2019 and earlier. FINDINGS: Lower chest: Negative. No pericardial or pleural effusion.  Hepatobiliary: Small volume pneumoperitoneum and free fluid around the liver. Mildly complex fluid density. Liver enhancement maintained. Gallbladder within normal limits. No bile duct enlargement. Pancreas: Within normal limits. Spleen: Small volume perisplenic fluid. Otherwise negative. Adrenals/Urinary Tract: Chronically absent left kidney. Stable postoperative appearance of the left renal fossa since 2020. Normal adrenal glands. Nonobstructed solitary right kidney. Normal right renal contrast excretion. Diminutive bladder. Stomach/Bowel: Severe diverticulosis of the descending and sigmoid colon, bowel walls of those segments appear highly indistinct. Small volume pneumoperitoneum under the diaphragm and situated adjacent to the liver. No retroperitoneal gas is identified. Complex fluid layering in both pericolic gutters. Small volume free fluid elsewhere in the abdomen. Superimposed chronic but enlarged and inflamed appearing umbilical hernia containing transverse mesocolon (series 2, image 40). No free air within the hernia, but underlying transverse colon diverticula and irregular bowel wall appearance there also. Additionally, there is a large gas containing 6.4 cm diverticulum versus contained perforation immediately subjacent to the herniated mesentery (coronal image 64) which is in proximity to both the transverse colon and inflamed gastroduodenal junction. Axial images however suggest this communicates with the transverse colon (series 2 images 35 through 39). Right: Mostly decompressed and fluid containing. No superimposed dilated small bowel. Small gastric hiatal hernia, and small volume of pneumoperitoneum in that hernia sac. Inflamed appearance of both the gastric antrum and the duodenal C loop as seen on coronal image 38. Duodenum is decompressed. The distal duodenum and ligament of Treitz have a more normal appearance. No other inflamed small bowel loops identified. Cecum is located in the midline,  and midline appendix appears normal on coronal image 49. Vascular/Lymphatic: Widespread Aortoiliac calcified atherosclerosis. Tortuous infrarenal abdominal aorta, but no infrarenal aneurysm considering less than 1.5 times the normal caliber. Major arterial structures remain patent. No lymphadenopathy identified. Portal venous system patent. Reproductive: Negative. Other: Complex free fluid in the pelvis, moderate to large volume. Musculoskeletal: Chronic grade 1 spondylolisthesis at L4-L5. No acute osseous abnormality identified. IMPRESSION: 1. Positive for Acutely Perforated Bowel. In the epigastrium both a transverse mesocolon containing umbilical hernia and the gastroduodenal junction appear acutely inflamed. Just deep to the ventral abdominal hernia a 6.4 cm pocket of gas could be a giant diverticulum, or perforation site, and is favored to arise from the Transverse Colon. But the possibility of a perforated peptic ulcer disease remains. 2. Associated pneumoperitoneum and mildly complex free fluid in the abdomen and pelvis. 3. Superimposed severe diverticulosis of both the descending and sigmoid colon - both of which have indistinct bowel walls, but an epigastric source of perforation is favored as in #1. 4. No bowel obstruction.  No evidence of pancreatitis. 5. Chronically left nephrectomy. Advanced Aortic Atherosclerosis (ICD10-I70.0), tortuous aorta. Study discussed by telephone with Dr. Delora Fuel on 7/78/2423 at 05:56 . Electronically Signed   By: Genevie Ann M.D.   On: 08/26/2021 06:05     Assessment & Plan:  NYKEEM CITRO is a 66 y.o. male who presents with a several hour history of worsening abdominal pain.  CT abdomen and pelvis demonstrates pneumoperitoneum consistent with perforated viscus.  -Imaging evaluated by myself, unsure if perforation is related to gastroduodenal inflammatory changes versus perforated transverse colon -Discussed the need for exploratory laparotomy with possible Phillip Heal  patch repair, possible bowel resection, and possible ostomy.  We discussed all the different pathology that could potentially be causing the pneumoperitoneum.  The  risks and benefits of this procedure were discussed, including but not limited to bleeding, infection, need for additional procedure, injury to surrounding structures, need for possible prolonged postoperative intubation, adverse cardiac event, or death.  The patient and his wife understand the risks, and he gives consent for the procedure. -Plan to proceed to operating room this morning -Rocephin, Flagyl, Diflucan -NG tube to LIS -NPO -IVF -Foley catheter ordered -Further recommendations to follow surgery  All questions were answered to the satisfaction of the patient and family.  -- Graciella Freer, DO [facility] [address] [place], [place] [address] [phone] (office)

## 2021-08-26 NOTE — Progress Notes (Signed)
Anaerobic bottle gram positive coxcy Reported by lab

## 2021-08-26 NOTE — Anesthesia Postprocedure Evaluation (Signed)
Anesthesia Post Note  Patient: Michael Brady  Procedure(s) Performed: EXPLORATORY LAPAROTOMY GASTRORRHAPHY HERNIA REPAIR INCISIONAL (Right)  Patient location during evaluation: PACU Anesthesia Type: General Level of consciousness: awake and alert Pain management: pain level controlled Vital Signs Assessment: post-procedure vital signs reviewed and stable Respiratory status: spontaneous breathing, nonlabored ventilation, respiratory function stable and patient connected to nasal cannula oxygen Cardiovascular status: blood pressure returned to baseline and stable Postop Assessment: no apparent nausea or vomiting Anesthetic complications: no   No notable events documented.   Last Vitals:  Vitals:   08/26/21 0600 08/26/21 0745  BP: 110/74   Pulse: 91 93  Resp: (!) 24   Temp:    SpO2: (!) 89% 96%    Last Pain:  Vitals:   08/26/21 0709  PainSc: 10-Worst pain ever                 Louann Sjogren

## 2021-08-27 DIAGNOSIS — N1832 Chronic kidney disease, stage 3b: Secondary | ICD-10-CM | POA: Diagnosis not present

## 2021-08-27 DIAGNOSIS — K430 Incisional hernia with obstruction, without gangrene: Secondary | ICD-10-CM

## 2021-08-27 DIAGNOSIS — K5790 Diverticulosis of intestine, part unspecified, without perforation or abscess without bleeding: Secondary | ICD-10-CM | POA: Diagnosis not present

## 2021-08-27 DIAGNOSIS — Z905 Acquired absence of kidney: Secondary | ICD-10-CM | POA: Diagnosis not present

## 2021-08-27 DIAGNOSIS — K631 Perforation of intestine (nontraumatic): Secondary | ICD-10-CM | POA: Diagnosis not present

## 2021-08-27 DIAGNOSIS — D72829 Elevated white blood cell count, unspecified: Secondary | ICD-10-CM

## 2021-08-27 LAB — COMPREHENSIVE METABOLIC PANEL
ALT: 22 U/L (ref 0–44)
AST: 22 U/L (ref 15–41)
Albumin: 2.9 g/dL — ABNORMAL LOW (ref 3.5–5.0)
Alkaline Phosphatase: 53 U/L (ref 38–126)
Anion gap: 6 (ref 5–15)
BUN: 24 mg/dL — ABNORMAL HIGH (ref 8–23)
CO2: 28 mmol/L (ref 22–32)
Calcium: 8.4 mg/dL — ABNORMAL LOW (ref 8.9–10.3)
Chloride: 102 mmol/L (ref 98–111)
Creatinine, Ser: 1.3 mg/dL — ABNORMAL HIGH (ref 0.61–1.24)
GFR, Estimated: >60 mL/min (ref >60)
Glucose, Bld: 113 mg/dL — ABNORMAL HIGH (ref 70–99)
Potassium: 4.6 mmol/L (ref 3.5–5.1)
Sodium: 136 mmol/L (ref 135–145)
Total Bilirubin: 0.6 mg/dL (ref 0.3–1.2)
Total Protein: 6.1 g/dL — ABNORMAL LOW (ref 6.5–8.1)

## 2021-08-27 LAB — CBC WITH DIFFERENTIAL/PLATELET
Abs Immature Granulocytes: 0.07 10*3/uL (ref 0.00–0.07)
Basophils Absolute: 0 10*3/uL (ref 0.0–0.1)
Basophils Relative: 0 %
Eosinophils Absolute: 0.1 10*3/uL (ref 0.0–0.5)
Eosinophils Relative: 1 %
HCT: 38.3 % — ABNORMAL LOW (ref 39.0–52.0)
Hemoglobin: 12.6 g/dL — ABNORMAL LOW (ref 13.0–17.0)
Immature Granulocytes: 1 %
Lymphocytes Relative: 7 %
Lymphs Abs: 0.9 10*3/uL (ref 0.7–4.0)
MCH: 31.6 pg (ref 26.0–34.0)
MCHC: 32.9 g/dL (ref 30.0–36.0)
MCV: 96 fL (ref 80.0–100.0)
Monocytes Absolute: 0.8 10*3/uL (ref 0.1–1.0)
Monocytes Relative: 6 %
Neutro Abs: 11.2 10*3/uL — ABNORMAL HIGH (ref 1.7–7.7)
Neutrophils Relative %: 85 %
Platelets: 250 10*3/uL (ref 150–400)
RBC: 3.99 MIL/uL — ABNORMAL LOW (ref 4.22–5.81)
RDW: 13 % (ref 11.5–15.5)
WBC: 13.1 10*3/uL — ABNORMAL HIGH (ref 4.0–10.5)
nRBC: 0 % (ref 0.0–0.2)

## 2021-08-27 LAB — MAGNESIUM: Magnesium: 1.6 mg/dL — ABNORMAL LOW (ref 1.7–2.4)

## 2021-08-27 MED ORDER — MAGNESIUM SULFATE 4 GM/100ML IV SOLN
4.0000 g | Freq: Once | INTRAVENOUS | Status: AC
  Administered 2021-08-27: 4 g via INTRAVENOUS
  Filled 2021-08-27: qty 100

## 2021-08-27 NOTE — Assessment & Plan Note (Signed)
Reportedly diet controlled

## 2021-08-27 NOTE — Assessment & Plan Note (Signed)
Stable

## 2021-08-27 NOTE — Progress Notes (Signed)
Pt required PRN pain meds with relief. Michael Brady 08/27/21 1:38 AM

## 2021-08-27 NOTE — Plan of Care (Signed)
  Problem: Education: Goal: Knowledge of General Education information will improve Description: Including pain rating scale, medication(s)/side effects and non-pharmacologic comfort measures Outcome: Progressing   Problem: Clinical Measurements: Goal: Ability to maintain clinical measurements within normal limits will improve Outcome: Progressing   

## 2021-08-27 NOTE — Assessment & Plan Note (Signed)
Reactive but resolved now.

## 2021-08-27 NOTE — Assessment & Plan Note (Addendum)
POD #5 s/p repair on 2/18 by surgical team

## 2021-08-27 NOTE — Assessment & Plan Note (Addendum)
Resolved

## 2021-08-27 NOTE — Plan of Care (Signed)
  Problem: Nutrition: Goal: Adequate nutrition will be maintained Outcome: Progressing   Problem: Coping: Goal: Level of anxiety will decrease Outcome: Progressing   Problem: Pain Managment: Goal: General experience of comfort will improve Outcome: Progressing   Problem: Safety: Goal: Ability to remain free from injury will improve Outcome: Progressing   

## 2021-08-27 NOTE — Progress Notes (Signed)
[facility] Progress Note  1 Day Post-Op  Subjective: Patient seen and examined.  He is resting comfortably in bed.  He does complain of some abdominal soreness at his incision site, but it is better than the pain he experienced preoperatively.  He denies nausea and vomiting.  He also denies flatus or bowel movement since surgery.  His NG tube has had 515 cc of gastric contents out.  Objective: Vital signs in last 24 hours: Temp:  [97.8 F (36.6 C)-98.6 F (37 C)] 98.6 F (37 C) (02/19 0322) Pulse Rate:  [91-94] 92 (02/19 0322) Resp:  [18-22] 20 (02/19 0322) BP: (114-139)/(77-90) 139/85 (02/19 0322) SpO2:  [90 %-98 %] 93 % (02/19 0322) Weight:  [73.3 kg-73.4 kg] 73.3 kg (02/19 0500)    Intake/Output from previous day: 02/18 0701 - 02/19 0700 In: 3530 [I.V.:3530] Out: 2000 [Urine:1460; Emesis/NG output:515; Blood:25] Intake/Output this shift: No intake/output data recorded.  General appearance: alert, cooperative, and no distress Nose: NG tube in place GI: Abdomen soft, nondistended, no percussion tenderness, mild incisional tenderness to palpation, no rigidity, guarding, rebound tenderness; incision C/D/I with dressing in place, minimal dried bloody strikethrough  Lab Results:  Recent Labs    08/26/21 0300 08/27/21 0508  WBC 10.8* 13.1*  HGB 15.7 12.6*  HCT 49.1 38.3*  PLT 431* 250   BMET Recent Labs    08/26/21 0300 08/27/21 0508  NA 135 136  K 4.3 4.6  CL 99 102  CO2 23 28  GLUCOSE 135* 113*  BUN 28* 24*  CREATININE 1.44* 1.30*  CALCIUM 9.3 8.4*   PT/INR No results for input(s): LABPROT, INR in the last 72 hours.  Studies/Results: CT ABDOMEN PELVIS W CONTRAST  Result Date: 08/26/2021 CLINICAL DATA:  66 year old male with acute 10/10 epigastric abdominal pain. Pancreatitis suspected. EXAM: CT ABDOMEN AND PELVIS WITH CONTRAST TECHNIQUE: Multidetector CT imaging of the abdomen and pelvis was performed using the standard protocol following  bolus administration of intravenous contrast. RADIATION DOSE REDUCTION: This exam was performed according to the departmental dose-optimization program which includes automated exposure control, adjustment of the mA and/or kV according to patient size and/or use of iterative reconstruction technique. CONTRAST:  67mL OMNIPAQUE IOHEXOL 300 MG/ML  SOLN COMPARISON:  CT Abdomen and Pelvis [facility] 06/12/2019 and earlier. FINDINGS: Lower chest: Negative. No pericardial or pleural effusion. Hepatobiliary: Small volume pneumoperitoneum and free fluid around the liver. Mildly complex fluid density. Liver enhancement maintained. Gallbladder within normal limits. No bile duct enlargement. Pancreas: Within normal limits. Spleen: Small volume perisplenic fluid. Otherwise negative. Adrenals/Urinary Tract: Chronically absent left kidney. Stable postoperative appearance of the left renal fossa since 2020. Normal adrenal glands. Nonobstructed solitary right kidney. Normal right renal contrast excretion. Diminutive bladder. Stomach/Bowel: Severe diverticulosis of the descending and sigmoid colon, bowel walls of those segments appear highly indistinct. Small volume pneumoperitoneum under the diaphragm and situated adjacent to the liver. No retroperitoneal gas is identified. Complex fluid layering in both pericolic gutters. Small volume free fluid elsewhere in the abdomen. Superimposed chronic but enlarged and inflamed appearing umbilical hernia containing transverse mesocolon (series 2, image 40). No free air within the hernia, but underlying transverse colon diverticula and irregular bowel wall appearance there also. Additionally, there is a large gas containing 6.4 cm diverticulum versus contained perforation immediately subjacent to the herniated mesentery (coronal image 64) which is in proximity to both the transverse colon and inflamed gastroduodenal junction. Axial images however suggest this communicates  with the transverse colon (series 2  images 35 through 39). Right: Mostly decompressed and fluid containing. No superimposed dilated small bowel. Small gastric hiatal hernia, and small volume of pneumoperitoneum in that hernia sac. Inflamed appearance of both the gastric antrum and the duodenal C loop as seen on coronal image 38. Duodenum is decompressed. The distal duodenum and ligament of Treitz have a more normal appearance. No other inflamed small bowel loops identified. Cecum is located in the midline, and midline appendix appears normal on coronal image 49. Vascular/Lymphatic: Widespread Aortoiliac calcified atherosclerosis. Tortuous infrarenal abdominal aorta, but no infrarenal aneurysm considering less than 1.5 times the normal caliber. Major arterial structures remain patent. No lymphadenopathy identified. Portal venous system patent. Reproductive: Negative. Other: Complex free fluid in the pelvis, moderate to large volume. Musculoskeletal: Chronic grade 1 spondylolisthesis at L4-L5. No acute osseous abnormality identified. IMPRESSION: 1. Positive for Acutely Perforated Bowel. In the epigastrium both a transverse mesocolon containing umbilical hernia and the gastroduodenal junction appear acutely inflamed. Just deep to the ventral abdominal hernia a 6.4 cm pocket of gas could be a giant diverticulum, or perforation site, and is favored to arise from the Transverse Colon. But the possibility of a perforated peptic ulcer disease remains. 2. Associated pneumoperitoneum and mildly complex free fluid in the abdomen and pelvis. 3. Superimposed severe diverticulosis of both the descending and sigmoid colon - both of which have indistinct bowel walls, but an epigastric source of perforation is favored as in #1. 4. No bowel obstruction.  No evidence of pancreatitis. 5. Chronically left nephrectomy. Advanced Aortic Atherosclerosis (ICD10-I70.0), tortuous aorta. Study discussed by telephone with Dr. Delora Fuel on  5/00/3704 at 05:56 . Electronically Signed   By: Genevie Ann M.D.   On: 08/26/2021 06:05   DG Chest 1V REPEAT Same Day  Result Date: 08/26/2021 CLINICAL DATA:  NG tube placement EXAM: CHEST - 1 VIEW SAME DAY COMPARISON:  Previous studies including the examination done earlier today FINDINGS: There is interval repositioning of enteric tube with its tip in the stomach. Skin staples are seen in the abdomen. Cardiac size is within normal limits. Increased density is seen in the medial left lower lung fields. In the CT done earlier today there was no definite focal infiltrate in the medial left lower lung fields. There is soft tissue fullness in the retrocardiac region which may be due to tortuous thoracic aorta or small fixed hiatal hernia. Costophrenic angles are clear. There is no pneumothorax. Degenerative changes are noted in right shoulder. There is large smooth marginated calcification adjacent to the inferior margin of glenoid on the right side possibly a loose body or old ununited fracture. IMPRESSION: There is interval repositioning of enteric tube with its tip in the stomach. There is increased opacity in the medial left lower lung fields suggesting atelectasis. Possible small hiatal hernia. Electronically Signed   By: Elmer Picker M.D.   On: 08/26/2021 15:42   DG CHEST PORT 1 VIEW  Result Date: 08/26/2021 CLINICAL DATA:  NG tube placement EXAM: PORTABLE CHEST 1 VIEW COMPARISON:  03/15/2020 FINDINGS: Esophagogastric tube is positioned folded and doubled back over the esophagus. The heart size and mediastinal contours are within normal limits. Both lungs are clear. The visualized skeletal structures are unremarkable. IMPRESSION: 1. Esophagogastric tube is positioned folded and doubled back over the esophagus. Recommend repositioning. 2. No acute abnormality of the lungs in AP portable projection. Electronically Signed   By: Delanna Ahmadi M.D.   On: 08/26/2021 13:48     Anti-infectives: Anti-infectives (From admission, onward)  Start     Dose/Rate Route Frequency Ordered Stop   08/27/21 0900  fluconazole (DIFLUCAN) IVPB 200 mg        200 mg 100 mL/hr over 60 Minutes Intravenous Every 24 hours 08/26/21 0813     08/27/21 0600  cefTRIAXone (ROCEPHIN) 1 g in sodium chloride 0.9 % 100 mL IVPB        1 g 200 mL/hr over 30 Minutes Intravenous Every 24 hours 08/26/21 1122     08/26/21 1400  metroNIDAZOLE (FLAGYL) IVPB 500 mg        500 mg 100 mL/hr over 60 Minutes Intravenous Every 8 hours 08/26/21 0736     08/26/21 0813  metroNIDAZOLE (FLAGYL) 500 MG/100ML IVPB       Note to Pharmacy: Lear Ng S: cabinet override      08/26/21 0813 08/26/21 2208   08/26/21 0730  fluconazole (DIFLUCAN) IVPB 800 mg  Status:  Discontinued        800 mg 200 mL/hr over 120 Minutes Intravenous Every 24 hours 08/26/21 0717 08/26/21 0813   08/26/21 0615  cefTRIAXone (ROCEPHIN) 2 g in sodium chloride 0.9 % 100 mL IVPB        2 g 200 mL/hr over 30 Minutes Intravenous  Once 08/26/21 0604 08/26/21 0709   08/26/21 0615  metroNIDAZOLE (FLAGYL) IVPB 500 mg        500 mg 100 mL/hr over 60 Minutes Intravenous  Once 08/26/21 0604 08/26/21 1540       Assessment/Plan:  Patient is a 66 year old male who is status post exploratory laparotomy, gastrorrhaphy with Phillip Heal patch repair, and incisional hernia repair for gastroduodenal ulcer perforation and incarcerated incisional hernia.  -Mild leukocytosis this morning of 13.1, previously 10.8 -likely postsurgical, will continue to monitor -Continue Rocephin, Flagyl, and Diflucan -Peritoneal fluid cultures with no organisms noted -Maintain NG tube to LIS -NPO -Plan for upper GI likely tomorrow to evaluate repair prior to removal of NG tube and initiation of diet -PRN pain control and antiemetics -IVF -Maintain Foley while NG tube in place for accurate I's and O's -Appreciate internal medicine recommendations   LOS: 1 day     Rosslyn Pasion A Deniqua Perry 08/27/2021

## 2021-08-27 NOTE — Assessment & Plan Note (Signed)
Noted on CT scan.  He may benefit from outpatient GI follow up.

## 2021-08-27 NOTE — Progress Notes (Addendum)
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  There was no SOB. No nausea, vomiting or diarrhea.  This was a severe frightening pain that he has never experienced and it continues.  Any movement exacerbates the pain.  He has not been able to eat or drink. He has been evaluated in the ED his EKG did not show acute changes.  He had a mild leukocytosis and mild lipase elevation.  CT scan shows perforated viscus with free air present and an incarcerated umbilical hernia with inflammatory changes around the gastroduodenal junction.  Pt was started on IV protonix and IV antibiotics and IV fluids.  Surgery consulted and requested medical admission and plan is for patient to go for exploratory lap later today.    08/27/2021:  POD #1 - Pt having some pain, but controlled with medication.  No BS. NG remains in place.  Surgery planning for upper GI study on 2/20 to evaluate repair.    Assessment and Plan: * Perforated bowel ([place])- (present on admission) Surgery team consulted Maintain NPO IV pain management IV fluids POD #1 s/p Exp lap on 2/18  Incarcerated incisional hernia- (present on admission) POD #1 s/p repair on 2/18 by surgical team  Pneumoperitoneum- (present on admission) Pt is postop s/p gastroduodenal repair.  Surgery is planning for Upper GI study on 2/20 to assess repair   Sudden onset of severe abdominal pain- (present on admission) IV pain management as ordered.  Pt  went to OR 2/18 for exp lap with gastroduodenal ulcer repair.   Hypomagnesemia IV replacement ordered Recheck in AM  Severe Diverticulosis- (present on admission) Noted on CT scan.  He may benefit from outpatient GI follow up.   Leukocytosis- (present on admission) Suspect reactive and will continue to follow  CKD (chronic kidney disease) stage 3, GFR 30-59 ml/min (HCC)- (present on admission) Stable from prior tests.    Smoker- (present on admission) Nicotine patch ordered Counseled on urgent need for tobacco cessation at bedside, pt verbalized understanding  H/O left nephrectomy Stable   History of depression Stable on home meds.   Thrombocytosis- (present on admission) Reactive but resolved now.  History of renal pelvis cancer S/p left nephroureterectomy in 2020 by Dr. Tresa Moore  Hyperlipidemia- (present on admission) Reportedly diet controlled   DVT prophylaxis: SCDs Code Status: Full  Family Communication: bedside update 2/18 Disposition: Status is: Inpatient Remains inpatient appropriate because: IV pain management, IV antibiotics, severity of illness  Consultants:  Surgery ([facility]) Procedures:  08/26/21:  Exploratory laparotomy with gastrorrhaphy and Phillip Heal patch repair, repair of incarcerated incisional hernia Antimicrobials:  2/18 >> fluconazole, metronidazole, ceftriaxone  Subjective: Pt having some postop tenderness in the mid abdomen but otherwise ok.  He has no SOB or chest pain or palpitations. No BM.  Objective: Vitals:   08/26/21 2053 08/27/21 0322 08/27/21 0500 08/27/21 1420  BP: 118/77 139/85  (!) 146/85  Pulse: 91 92  95  Resp: 18 20  (!) 22  Temp: 97.8 F (36.6  C) 98.6 F (37 C)  98 F (36.7 C)  TempSrc:    Oral  SpO2: 94% 93%  93%  Weight:   73.3 kg   Height:        Intake/Output Summary (Last 24 hours) at 08/27/2021 1444 Last data filed at 08/27/2021 1126 Gross per 24 hour  Intake 1500 ml  Output 2350 ml  Net -850 ml   Filed Weights    08/26/21 1125 08/27/21 0500  Weight: 73.4 kg 73.3 kg   Examination:  General exam: Appears calm and comfortable  Respiratory system: Clear to auscultation. Respiratory effort normal. Cardiovascular system: normal S1 & S2 heard. No JVD, murmurs, rubs, gallops or clicks. No pedal edema. Gastrointestinal system: Abdomen is nondistended, soft and midline wound clean and dry.  No bowel sounds heard. Central nervous system: Alert and oriented. No focal neurological deficits. Extremities: Symmetric 5 x 5 power. Skin: No rashes, lesions or ulcers. Psychiatry: Judgement and insight appear normal. Mood & affect appropriate.   Data Reviewed: I have personally reviewed following labs and imaging studies  CBC: Recent Labs  Lab 08/26/21 0300 08/27/21 0508  WBC 10.8* 13.1*  NEUTROABS 5.4 11.2*  HGB 15.7 12.6*  HCT 49.1 38.3*  MCV 95.3 96.0  PLT 431* 657    Basic Metabolic Panel: Recent Labs  Lab 08/26/21 0300 08/27/21 0508  NA 135 136  K 4.3 4.6  CL 99 102  CO2 23 28  GLUCOSE 135* 113*  BUN 28* 24*  CREATININE 1.44* 1.30*  CALCIUM 9.3 8.4*  MG  --  1.6*    CBG: No results for input(s): GLUCAP in the last 168 hours.  Recent Results (from the past 240 hour(s))  Resp Panel by RT-PCR (Flu A&B, Covid) Nasopharyngeal Swab     Status: None   Collection Time: 08/26/21  6:11 AM   Specimen: Nasopharyngeal Swab; Nasopharyngeal(NP) swabs in vial transport medium  Result Value Ref Range Status   SARS Coronavirus 2 by RT PCR NEGATIVE NEGATIVE Final    Comment: (NOTE) SARS-CoV-2 target nucleic acids are NOT DETECTED.  The SARS-CoV-2 RNA is generally detectable in upper respiratory specimens during the acute phase of infection. The lowest concentration of SARS-CoV-2 viral copies this assay can detect is 138 copies/mL. A negative result does not preclude SARS-Cov-2 infection and should not be used as the sole basis for treatment or other patient management decisions. A negative  result may occur with  improper specimen collection/handling, submission of specimen other than nasopharyngeal swab, presence of viral mutation(s) within the areas targeted by this assay, and inadequate number of viral copies(<138 copies/mL). A negative result must be combined with clinical observations, patient history, and epidemiological information. The expected result is Negative.  Fact Sheet for Patients:  EntrepreneurPulse.com.au  Fact Sheet for Healthcare Providers:  IncredibleEmployment.be  This test is no t yet approved or cleared by the [place] FDA and  has been authorized for detection and/or diagnosis of SARS-CoV-2 by FDA under an Emergency Use Authorization (EUA). This EUA will remain  in effect (meaning this test can be used) for the duration of the COVID-19 declaration under Section 564(b)(1) of the Act, 21 U.S.C.section 360bbb-3(b)(1), unless the authorization is terminated  or revoked sooner.       Influenza A by PCR NEGATIVE NEGATIVE Final   Influenza B by PCR NEGATIVE NEGATIVE Final    Comment: (NOTE) The Xpert Xpress SARS-CoV-2/FLU/RSV plus assay is intended as an aid in the diagnosis of influenza from Nasopharyngeal swab specimens and  should not be used as a sole basis for treatment. Nasal washings and aspirates are unacceptable for Xpert Xpress SARS-CoV-2/FLU/RSV testing.  Fact Sheet for Patients: EntrepreneurPulse.com.au  Fact Sheet for Healthcare Providers: IncredibleEmployment.be  This test is not yet approved or cleared by the [place] FDA and has been authorized for detection and/or diagnosis of SARS-CoV-2 by FDA under an Emergency Use Authorization (EUA). This EUA will remain in effect (meaning this test can be used) for the duration of the COVID-19 declaration under Section 564(b)(1) of the Act, 21 U.S.C. section 360bbb-3(b)(1), unless the authorization is  terminated or revoked.  Performed at [facility], [address], [place], [place] [address]   Aerobic/Anaerobic Culture w Gram Stain (surgical/deep wound)     Status: None (Preliminary result)   Collection Time: 08/26/21  9:27 AM   Specimen: PATH Other; Tissue  Result Value Ref Range Status   Specimen Description   Final    TISSUE Performed at [facility], [address], [place], [place] [address]    Special Requests   Final    PERITONEAL Performed at [facility], [address] [place], [place] [address]    Gram Stain   Final    RARE WBC PRESENT, PREDOMINANTLY PMN NO ORGANISMS SEEN    Culture   Final    RARE STAPHYLOCOCCUS AUREUS CULTURE REINCUBATED FOR BETTER GROWTH Performed at [facility] [address] [place], [place] [address]    Report Status PENDING  Incomplete  Culture, blood (routine x 2)     Status: None (Preliminary result)   Collection Time: 08/26/21 12:37 PM   Specimen: BLOOD RIGHT HAND  Result Value Ref Range Status   Specimen Description   Final    BLOOD RIGHT HAND BOTTLES DRAWN AEROBIC AND ANAEROBIC   Special Requests   Final    Blood Culture results may not be optimal due to an excessive volume of blood received in culture bottles   Culture  Setup Time   Final    GRAM POSITIVE COCCI Gram Stain Report Called to,Read Back By and Verified With: Icelyn Navarrete,N @ 2341 ON 08/26/21 BY JUW ANAEROBIC  GS DONE @ [facility]    Culture   Final    NO GROWTH < 24 HOURS Performed at [facility], [address] [place], [place] [address]    Report Status PENDING  Incomplete  Culture, blood (routine x 2)     Status: None (Preliminary result)   Collection Time: 08/26/21 12:49 PM   Specimen: BLOOD LEFT HAND  Result Value Ref Range Status   Specimen Description   Final    BLOOD LEFT HAND BOTTLES DRAWN AEROBIC AND ANAEROBIC   Special Requests   Final    Blood Culture results may not be optimal due to an excessive volume of blood received in culture bottles    Culture   Final    NO GROWTH < 24 HOURS Performed at [facility], [address], [place], [place] [address]    Report Status PENDING  Incomplete     Radiology Studies: CT ABDOMEN PELVIS W CONTRAST  Result Date: 08/26/2021 CLINICAL DATA:  66 year old male with acute 10/10 epigastric abdominal pain. Pancreatitis suspected. EXAM: CT ABDOMEN AND PELVIS WITH CONTRAST TECHNIQUE: Multidetector CT imaging of the abdomen and pelvis was performed using the standard protocol following bolus administration of intravenous contrast. RADIATION DOSE REDUCTION: This exam was performed according to the departmental dose-optimization program which includes automated exposure control, adjustment of the mA and/or kV according  to patient size and/or use of iterative reconstruction technique. CONTRAST:  54mL OMNIPAQUE IOHEXOL 300 MG/ML  SOLN COMPARISON:  CT Abdomen and Pelvis [facility] 06/12/2019 and earlier. FINDINGS: Lower chest: Negative. No pericardial or pleural effusion. Hepatobiliary: Small volume pneumoperitoneum and free fluid around the liver. Mildly complex fluid density. Liver enhancement maintained. Gallbladder within normal limits. No bile duct enlargement. Pancreas: Within normal limits. Spleen: Small volume perisplenic fluid. Otherwise negative. Adrenals/Urinary Tract: Chronically absent left kidney. Stable postoperative appearance of the left renal fossa since 2020. Normal adrenal glands. Nonobstructed solitary right kidney. Normal right renal contrast excretion. Diminutive bladder. Stomach/Bowel: Severe diverticulosis of the descending and sigmoid colon, bowel walls of those segments appear highly indistinct. Small volume pneumoperitoneum under the diaphragm and situated adjacent to the liver. No retroperitoneal gas is identified. Complex fluid layering in both pericolic gutters. Small volume free fluid elsewhere in the abdomen. Superimposed chronic but enlarged and inflamed appearing  umbilical hernia containing transverse mesocolon (series 2, image 40). No free air within the hernia, but underlying transverse colon diverticula and irregular bowel wall appearance there also. Additionally, there is a large gas containing 6.4 cm diverticulum versus contained perforation immediately subjacent to the herniated mesentery (coronal image 64) which is in proximity to both the transverse colon and inflamed gastroduodenal junction. Axial images however suggest this communicates with the transverse colon (series 2 images 35 through 39). Right: Mostly decompressed and fluid containing. No superimposed dilated small bowel. Small gastric hiatal hernia, and small volume of pneumoperitoneum in that hernia sac. Inflamed appearance of both the gastric antrum and the duodenal C loop as seen on coronal image 38. Duodenum is decompressed. The distal duodenum and ligament of Treitz have a more normal appearance. No other inflamed small bowel loops identified. Cecum is located in the midline, and midline appendix appears normal on coronal image 49. Vascular/Lymphatic: Widespread Aortoiliac calcified atherosclerosis. Tortuous infrarenal abdominal aorta, but no infrarenal aneurysm considering less than 1.5 times the normal caliber. Major arterial structures remain patent. No lymphadenopathy identified. [place] venous system patent. Reproductive: Negative. Other: Complex free fluid in the pelvis, moderate to large volume. Musculoskeletal: Chronic grade 1 spondylolisthesis at L4-L5. No acute osseous abnormality identified. IMPRESSION: 1. Positive for Acutely Perforated Bowel. In the epigastrium both a transverse mesocolon containing umbilical hernia and the gastroduodenal junction appear acutely inflamed. Just deep to the ventral abdominal hernia a 6.4 cm pocket of gas could be a giant diverticulum, or perforation site, and is favored to arise from the Transverse Colon. But the possibility of a perforated peptic ulcer  disease remains. 2. Associated pneumoperitoneum and mildly complex free fluid in the abdomen and pelvis. 3. Superimposed severe diverticulosis of both the descending and sigmoid colon - both of which have indistinct bowel walls, but an epigastric source of perforation is favored as in #1. 4. No bowel obstruction.  No evidence of pancreatitis. 5. Chronically left nephrectomy. Advanced Aortic Atherosclerosis (ICD10-I70.0), tortuous aorta. Study discussed by telephone with Dr. Delora Fuel on 01/02/9484 at 05:56 . Electronically Signed   By: Genevie Ann M.D.   On: 08/26/2021 06:05   DG Chest 1V REPEAT Same Day  Result Date: 08/26/2021 CLINICAL DATA:  NG tube placement EXAM: CHEST - 1 VIEW SAME DAY COMPARISON:  Previous studies including the examination done earlier today FINDINGS: There is interval repositioning of enteric tube with its tip in the stomach. Skin staples are seen in the abdomen. Cardiac size is within normal limits. Increased density is seen in the medial  left lower lung fields. In the CT done earlier today there was no definite focal infiltrate in the medial left lower lung fields. There is soft tissue fullness in the retrocardiac region which may be due to tortuous thoracic aorta or small fixed hiatal hernia. Costophrenic angles are clear. There is no pneumothorax. Degenerative changes are noted in right shoulder. There is large smooth marginated calcification adjacent to the inferior margin of glenoid on the right side possibly a loose body or old ununited fracture. IMPRESSION: There is interval repositioning of enteric tube with its tip in the stomach. There is increased opacity in the medial left lower lung fields suggesting atelectasis. Possible small hiatal hernia. Electronically Signed   By: Elmer Picker M.D.   On: 08/26/2021 15:42   DG CHEST PORT 1 VIEW  Result Date: 08/26/2021 CLINICAL DATA:  NG tube placement EXAM: PORTABLE CHEST 1 VIEW COMPARISON:  03/15/2020 FINDINGS:  Esophagogastric tube is positioned folded and doubled back over the esophagus. The heart size and mediastinal contours are within normal limits. Both lungs are clear. The visualized skeletal structures are unremarkable. IMPRESSION: 1. Esophagogastric tube is positioned folded and doubled back over the esophagus. Recommend repositioning. 2. No acute abnormality of the lungs in AP portable projection. Electronically Signed   By: Delanna Ahmadi M.D.   On: 08/26/2021 13:48    Scheduled Meds:  pantoprazole (PROTONIX) IV  40 mg Intravenous Q12H   Continuous Infusions:  cefTRIAXone (ROCEPHIN)  IV 1 g (08/27/21 0717)   fluconazole (DIFLUCAN) IV 200 mg (08/27/21 1126)   lactated ringers 100 mL/hr at 08/27/21 0839   metronidazole 500 mg (08/27/21 0531)    LOS: 1 day   Time spent: 35 mins  Humaira Sculley Wynetta Emery, MD How to contact the [facility] Attending or Consulting provider [place] or covering provider during after hours [place], for this patient?  Check the care team in [facility] and look for a) attending/consulting [facility] provider listed and b) the [facility] team listed Log into [email or web address] and use [facility]'s universal password to access. If you do not have the password, please contact the hospital operator. Locate the [facility] provider you are looking for under [facility] and page to a number that you can be directly reached. If you still have difficulty reaching the provider, please page the [facility] (Director on Call) for the Hospitalists listed on amion for assistance.  08/27/2021, 2:44 PM

## 2021-08-28 ENCOUNTER — Inpatient Hospital Stay (HOSPITAL_COMMUNITY): Payer: BC Managed Care – PPO

## 2021-08-28 DIAGNOSIS — Z905 Acquired absence of kidney: Secondary | ICD-10-CM | POA: Diagnosis not present

## 2021-08-28 DIAGNOSIS — K5790 Diverticulosis of intestine, part unspecified, without perforation or abscess without bleeding: Secondary | ICD-10-CM | POA: Diagnosis not present

## 2021-08-28 DIAGNOSIS — K631 Perforation of intestine (nontraumatic): Secondary | ICD-10-CM | POA: Diagnosis not present

## 2021-08-28 DIAGNOSIS — N1832 Chronic kidney disease, stage 3b: Secondary | ICD-10-CM | POA: Diagnosis not present

## 2021-08-28 LAB — CBC WITH DIFFERENTIAL/PLATELET
Abs Immature Granulocytes: 0.26 10*3/uL — ABNORMAL HIGH (ref 0.00–0.07)
Basophils Absolute: 0 10*3/uL (ref 0.0–0.1)
Basophils Relative: 0 %
Eosinophils Absolute: 0 10*3/uL (ref 0.0–0.5)
Eosinophils Relative: 0 %
HCT: 36 % — ABNORMAL LOW (ref 39.0–52.0)
Hemoglobin: 11.7 g/dL — ABNORMAL LOW (ref 13.0–17.0)
Immature Granulocytes: 2 %
Lymphocytes Relative: 5 %
Lymphs Abs: 0.9 10*3/uL (ref 0.7–4.0)
MCH: 31.2 pg (ref 26.0–34.0)
MCHC: 32.5 g/dL (ref 30.0–36.0)
MCV: 96 fL (ref 80.0–100.0)
Monocytes Absolute: 1 10*3/uL (ref 0.1–1.0)
Monocytes Relative: 6 %
Neutro Abs: 14.2 10*3/uL — ABNORMAL HIGH (ref 1.7–7.7)
Neutrophils Relative %: 87 %
Platelets: 252 10*3/uL (ref 150–400)
RBC: 3.75 MIL/uL — ABNORMAL LOW (ref 4.22–5.81)
RDW: 13.1 % (ref 11.5–15.5)
WBC: 16.3 10*3/uL — ABNORMAL HIGH (ref 4.0–10.5)
nRBC: 0 % (ref 0.0–0.2)

## 2021-08-28 LAB — COMPREHENSIVE METABOLIC PANEL
ALT: 17 U/L (ref 0–44)
AST: 18 U/L (ref 15–41)
Albumin: 2.8 g/dL — ABNORMAL LOW (ref 3.5–5.0)
Alkaline Phosphatase: 61 U/L (ref 38–126)
Anion gap: 8 (ref 5–15)
BUN: 21 mg/dL (ref 8–23)
CO2: 27 mmol/L (ref 22–32)
Calcium: 8.7 mg/dL — ABNORMAL LOW (ref 8.9–10.3)
Chloride: 100 mmol/L (ref 98–111)
Creatinine, Ser: 1.1 mg/dL (ref 0.61–1.24)
GFR, Estimated: >60 mL/min (ref >60)
Glucose, Bld: 99 mg/dL (ref 70–99)
Potassium: 4.2 mmol/L (ref 3.5–5.1)
Sodium: 135 mmol/L (ref 135–145)
Total Bilirubin: 0.8 mg/dL (ref 0.3–1.2)
Total Protein: 5.9 g/dL — ABNORMAL LOW (ref 6.5–8.1)

## 2021-08-28 LAB — BLOOD CULTURE ID PANEL (REFLEXED) - BCID2

## 2021-08-28 LAB — MAGNESIUM: Magnesium: 2.1 mg/dL (ref 1.7–2.4)

## 2021-08-28 MED ORDER — IOHEXOL 300 MG/ML  SOLN
100.0000 mL | Freq: Once | INTRAMUSCULAR | Status: AC | PRN
  Administered 2021-08-28: 100 mL

## 2021-08-28 NOTE — Progress Notes (Signed)
Since NG tube removal has has 720 mls clear liquids and denies any nausea or increased pain. Family at bedside

## 2021-08-28 NOTE — TOC Progression Note (Signed)
Transition of Care [facility]) - Progression Note    Patient Details  Name: Michael Brady MRN: [id number] Date of Birth: 01/13/1956  Transition of Care [facility]) CM/SW Contact  Salome Arnt, Todd Phone Number: 08/28/2021, 10:15 AM  Clinical Narrative:   Transition of Care [facility]) Screening Note   Patient Details  Name: Michael Brady Date of Birth: 1956-02-18   Transition of Care [facility]) CM/SW Contact:    Salome Arnt, Harper Phone Number: 08/28/2021, 10:15 AM    Transition of Care Department [facility]) has reviewed patient and no TOC needs have been identified at this time. We will continue to monitor patient advancement through interdisciplinary progression rounds. If new patient transition needs arise, please place a TOC consult.         Barriers to Discharge: Continued Medical Work up  Expected Discharge Plan and Services                                                 Social Determinants of Health (SDOH) Interventions    Readmission Risk Interventions No flowsheet data found.

## 2021-08-28 NOTE — Progress Notes (Signed)
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  There was no SOB. No nausea, vomiting or diarrhea.  This was a severe frightening pain that he has never experienced and it continues.  Any movement exacerbates the pain.  He has not been able to eat or drink. He has been evaluated in the ED his EKG did not show acute changes.  He had a mild leukocytosis and mild lipase elevation.  CT scan shows perforated viscus with free air present and an incarcerated umbilical hernia with inflammatory changes around the gastroduodenal junction.  Pt was started on IV protonix and IV antibiotics and IV fluids.  Surgery consulted and requested medical admission and plan is for patient to go for exploratory lap later today.    08/27/2021:  POD #1 - Pt having some pain, but controlled with medication.  No BS. NG remains in place.  Surgery planning for upper GI study on 2/20 to evaluate repair.   08/28/2021: POD #2 - Pt continues to feel better.  Abd pain improving. No BM. Bowel sounds heard.  Upper GI study ordered today by surgery team.  NG remain in place.     Assessment and Plan: * Perforated bowel ([place])- (present on admission) Surgery team consulted Maintain NPO IV pain management IV fluids POD #2 s/p Exp lap on 2/18 Surgery planning upper GI study today to assess repair  Incarcerated incisional hernia- (present on admission) POD #2 s/p repair on 2/18 by surgical team  Pneumoperitoneum- (present  on admission) Pt is postop s/p gastroduodenal repair.  Surgery is planning for Upper GI study on 2/20 to assess repair   Sudden onset of severe abdominal pain- (present on admission) IV pain management as ordered.  Pt went to OR 2/18 for exp lap with gastroduodenal ulcer repair.  Abdominal pain much improved to nearly resolved.  Hypomagnesemia IV replacement ordered Recheck in AM  Severe Diverticulosis- (present on admission) Noted on CT scan.  He may benefit from outpatient GI follow up.   Leukocytosis- (present on admission) Mild bump in WBC to 16 today.  No fever, cultures no growth.  Recheck in AM.   CKD (chronic kidney disease) stage 3, GFR 30-59 ml/min (HCC)- (present on admission) Stable from prior tests.    Smoker- (present on admission) Nicotine patch ordered Counseled on urgent need for tobacco cessation at bedside, pt verbalized understanding  H/O left nephrectomy Stable   History of depression Stable on home meds.   Thrombocytosis- (present on admission) Reactive but resolved now.  History of renal pelvis cancer S/p left nephroureterectomy in 2020 by Dr. Tresa Moore  Hyperlipidemia- (present on admission) Reportedly diet controlled   DVT prophylaxis: SCDs Code Status: Full  Family Communication: bedside update 2/18 Disposition: Status is: Inpatient Remains inpatient appropriate because: IV pain management, IV antibiotics, severity of illness  Consultants:  Surgery ([facility]) Procedures:  08/26/21:  Exploratory laparotomy with gastrorrhaphy and Phillip Heal patch repair, repair of incarcerated incisional hernia Antimicrobials:  2/18 >> fluconazole, metronidazole, ceftriaxone  Subjective:  Pt has no flatus or bm yet, pain improving, no CP or SOB.   Objective: Vitals:   08/27/21 1503 08/27/21 2100 08/28/21 0610 08/28/21 1226  BP: 138/86 132/80 134/77 (!) 148/89  Pulse: 99 87 86 80  Resp: 18 18 18 18   Temp: 99.7 F (37.6 C) 98.3 F (36.8 C) 98.1 F (36.7 C) 99.6  F (37.6 C)  TempSrc: Oral Oral Oral Oral  SpO2: 91% 96% 93% 93%  Weight:      Height:        Intake/Output Summary (Last 24 hours) at 08/28/2021 1301 Last data filed at 08/28/2021 0900 Gross per 24 hour  Intake 700 ml  Output 450 ml  Net 250 ml   Filed Weights   08/26/21 1125 08/27/21 0500  Weight: 73.4 kg 73.3 kg   Examination:  General exam: Appears calm and comfortable, NGT in place.  Respiratory system: NGT suction sounds heard, otherwise lungs clear to auscultation. Respiratory effort normal. Cardiovascular system: normal S1 & S2 heard. No JVD, murmurs, rubs, gallops or clicks. No pedal edema. Gastrointestinal system: Abdomen is nondistended, soft and midline wound clean and dry.  Minimal bowel sounds heard. Central nervous system: Alert and oriented. No focal neurological deficits. Extremities: Symmetric 5 x 5 power. Skin: No rashes, lesions or ulcers. Psychiatry: Judgement and insight appear normal. Mood & affect appropriate.   Data Reviewed: I have personally reviewed following labs and imaging studies  CBC: Recent Labs  Lab 08/26/21 0300 08/27/21 0508 08/28/21 0528  WBC 10.8* 13.1* 16.3*  NEUTROABS 5.4 11.2* 14.2*  HGB 15.7 12.6* 11.7*  HCT 49.1 38.3* 36.0*  MCV 95.3 96.0 96.0  PLT 431* 250 211    Basic Metabolic Panel: Recent Labs  Lab 08/26/21 0300 08/27/21 0508 08/28/21 0528  NA 135 136 135  K 4.3 4.6 4.2  CL 99 102 100  CO2 23 28 27   GLUCOSE 135* 113* 99  BUN 28* 24* 21  CREATININE 1.44* 1.30* 1.10  CALCIUM 9.3 8.4* 8.7*  MG  --  1.6* 2.1    CBG: No results for input(s): GLUCAP in the last 168 hours.  Recent Results (from the past 240 hour(s))  Resp Panel by RT-PCR (Flu A&B, Covid) Nasopharyngeal Swab     Status: None   Collection Time: 08/26/21  6:11 AM   Specimen: Nasopharyngeal Swab; Nasopharyngeal(NP) swabs in vial transport medium  Result Value Ref Range Status   SARS Coronavirus 2 by RT PCR NEGATIVE NEGATIVE Final    Comment:  (NOTE) SARS-CoV-2 target nucleic acids are NOT DETECTED.  The SARS-CoV-2 RNA is generally detectable in upper respiratory specimens during the acute phase of infection. The lowest concentration of SARS-CoV-2 viral copies this assay can detect is 138 copies/mL. A negative result does not preclude SARS-Cov-2 infection and should not be used as the sole basis for treatment or other patient management decisions. A negative result may occur with  improper specimen collection/handling, submission of specimen other than nasopharyngeal swab, presence of viral mutation(s) within the areas targeted by this assay, and inadequate number of viral copies(<138 copies/mL). A negative result must be combined with clinical observations, patient history, and epidemiological information. The expected result is Negative.  Fact Sheet for Patients:  EntrepreneurPulse.com.au  Fact Sheet for Healthcare Providers:  IncredibleEmployment.be  This test is no t yet approved or cleared by the [place] FDA and  has been authorized for detection and/or diagnosis of SARS-CoV-2 by FDA under an Emergency Use Authorization (EUA). This EUA will remain  in effect (meaning this test can be used) for the duration of the COVID-19 declaration under Section 564(b)(1) of the Act, 21 U.S.C.section 360bbb-3(b)(1), unless the authorization is terminated  or revoked sooner.       Influenza A by PCR NEGATIVE NEGATIVE Final   Influenza B by PCR NEGATIVE NEGATIVE Final    Comment: (NOTE) The Xpert Xpress SARS-CoV-2/FLU/RSV plus assay is intended as an aid in the diagnosis of influenza from Nasopharyngeal swab specimens and should not be used as a sole basis for treatment. Nasal washings and aspirates are unacceptable for Xpert Xpress SARS-CoV-2/FLU/RSV testing.  Fact Sheet for Patients: EntrepreneurPulse.com.au  Fact Sheet for Healthcare  Providers: IncredibleEmployment.be  This test is not yet approved or cleared by the [place] FDA and has been authorized for detection and/or diagnosis of SARS-CoV-2 by FDA under an Emergency Use Authorization (EUA). This EUA will remain in effect (meaning this test can be used) for the duration of the COVID-19 declaration under Section 564(b)(1) of the Act, 21 U.S.C. section 360bbb-3(b)(1), unless the authorization is terminated or revoked.  Performed at [facility], [address], [place], [place] [address]   Aerobic/Anaerobic Culture w Gram Stain (surgical/deep wound)     Status: None (Preliminary result)   Collection Time: 08/26/21  9:27 AM   Specimen: PATH Other; Tissue  Result Value Ref Range Status   Specimen Description   Final    TISSUE Performed at [facility], [address] [place], [place] [address]    Special Requests   Final    PERITONEAL Performed at [facility], [address] [place], [place] [address]    Gram Stain   Final    RARE WBC PRESENT, PREDOMINANTLY PMN NO ORGANISMS SEEN    Culture   Final    RARE STAPHYLOCOCCUS AUREUS RARE STREPTOCOCCUS MITIS/ORALIS CULTURE REINCUBATED FOR BETTER GROWTH SUSCEPTIBILITIES TO FOLLOW Performed at [facility], [place] [address] [place], [place] [address]    Report Status PENDING  Incomplete  Culture, blood (routine x 2)     Status: None (Preliminary result)   Collection Time: 08/26/21 12:37 PM   Specimen: BLOOD RIGHT HAND  Result Value Ref Range Status   Specimen Description   Final    BLOOD RIGHT HAND BOTTLES DRAWN AEROBIC AND ANAEROBIC Performed at [facility], [address], [place], [place] [address]    Special Requests   Final    Blood Culture results may not be optimal due to an excessive volume of blood received in culture bottles Performed at [facility], [address], [place], [place] [address]    Culture  Setup Time   Final    CORRECTED RESULTS NO ORGANISMS  SEEN PREVIOUSLY REPORTED AS: GRAM POSITIVE COCCI ANAEROBIC BOTTLE ONLY CORRECTED RESULTS CALLED TO: Park Breed [id number] FCP    Culture   Final    NO GROWTH 2 DAYS Performed at [facility], [place] [address] [place], [place] [address]    Report Status PENDING  Incomplete  Blood Culture ID Panel (Reflexed)     Status: None   Collection Time: 08/26/21 12:37 PM  Result Value Ref Range Status   Enterococcus faecalis NOT DETECTED NOT DETECTED Final   Enterococcus Faecium NOT DETECTED NOT DETECTED Final   Listeria monocytogenes NOT DETECTED NOT DETECTED Final   Staphylococcus species NOT DETECTED NOT DETECTED Final   Staphylococcus aureus (BCID) NOT DETECTED NOT DETECTED Final   Staphylococcus epidermidis NOT DETECTED NOT DETECTED Final   Staphylococcus  lugdunensis NOT DETECTED NOT DETECTED Final   Streptococcus species NOT DETECTED NOT DETECTED Final   Streptococcus agalactiae NOT DETECTED NOT DETECTED Final   Streptococcus pneumoniae NOT DETECTED NOT DETECTED Final   Streptococcus pyogenes NOT DETECTED NOT DETECTED Final   A.calcoaceticus-baumannii NOT DETECTED NOT DETECTED Final   Bacteroides fragilis NOT DETECTED NOT DETECTED Final   Enterobacterales NOT DETECTED NOT DETECTED Final   Enterobacter cloacae complex NOT DETECTED NOT DETECTED Final   Escherichia coli NOT DETECTED NOT DETECTED Final   Klebsiella aerogenes NOT DETECTED NOT DETECTED Final   Klebsiella oxytoca NOT DETECTED NOT DETECTED Final   Klebsiella pneumoniae NOT DETECTED NOT DETECTED Final   Proteus species NOT DETECTED NOT DETECTED Final   Salmonella species NOT DETECTED NOT DETECTED Final   Serratia marcescens NOT DETECTED NOT DETECTED Final   Haemophilus influenzae NOT DETECTED NOT DETECTED Final   Neisseria meningitidis NOT DETECTED NOT DETECTED Final   Pseudomonas aeruginosa NOT DETECTED NOT DETECTED Final   Stenotrophomonas maltophilia NOT DETECTED NOT DETECTED Final   Candida albicans NOT  DETECTED NOT DETECTED Final   Candida auris NOT DETECTED NOT DETECTED Final   Candida glabrata NOT DETECTED NOT DETECTED Final   Candida krusei NOT DETECTED NOT DETECTED Final   Candida parapsilosis NOT DETECTED NOT DETECTED Final   Candida tropicalis NOT DETECTED NOT DETECTED Final   Cryptococcus neoformans/gattii NOT DETECTED NOT DETECTED Final    Comment: Performed at [facility], [address], [place], [place] [address]  Culture, blood (routine x 2)     Status: None (Preliminary result)   Collection Time: 08/26/21 12:49 PM   Specimen: BLOOD LEFT HAND  Result Value Ref Range Status   Specimen Description   Final    BLOOD LEFT HAND BOTTLES DRAWN AEROBIC AND ANAEROBIC   Special Requests   Final    Blood Culture results may not be optimal due to an excessive volume of blood received in culture bottles   Culture   Final    NO GROWTH < 24 HOURS Performed at [facility], [address], [place], [place] [address]    Report Status PENDING  Incomplete     Radiology Studies: DG Chest 1V REPEAT Same Day  Result Date: 08/26/2021 CLINICAL DATA:  NG tube placement EXAM: CHEST - 1 VIEW SAME DAY COMPARISON:  Previous studies including the examination done earlier today FINDINGS: There is interval repositioning of enteric tube with its tip in the stomach. Skin staples are seen in the abdomen. Cardiac size is within normal limits. Increased density is seen in the medial left lower lung fields. In the CT done earlier today there was no definite focal infiltrate in the medial left lower lung fields. There is soft tissue fullness in the retrocardiac region which may be due to tortuous thoracic aorta or small fixed hiatal hernia. Costophrenic angles are clear. There is no pneumothorax. Degenerative changes are noted in right shoulder. There is large smooth marginated calcification adjacent to the inferior margin of glenoid on the right side possibly a loose body or old ununited fracture. IMPRESSION:  There is interval repositioning of enteric tube with its tip in the stomach. There is increased opacity in the medial left lower lung fields suggesting atelectasis. Possible small hiatal hernia. Electronically Signed   By: Elmer Picker M.D.   On: 08/26/2021 15:42   DG CHEST PORT 1 VIEW  Result Date: 08/26/2021 CLINICAL DATA:  NG tube placement EXAM: PORTABLE CHEST 1 VIEW COMPARISON:  03/15/2020 FINDINGS: Esophagogastric  tube is positioned folded and doubled back over the esophagus. The heart size and mediastinal contours are within normal limits. Both lungs are clear. The visualized skeletal structures are unremarkable. IMPRESSION: 1. Esophagogastric tube is positioned folded and doubled back over the esophagus. Recommend repositioning. 2. No acute abnormality of the lungs in AP portable projection. Electronically Signed   By: Delanna Ahmadi M.D.   On: 08/26/2021 13:48    Scheduled Meds:  pantoprazole (PROTONIX) IV  40 mg Intravenous Q12H   Continuous Infusions:  cefTRIAXone (ROCEPHIN)  IV 1 g (08/28/21 0814)   fluconazole (DIFLUCAN) IV 200 mg (08/28/21 0942)   lactated ringers 100 mL/hr at 08/27/21 0839   metronidazole 500 mg (08/28/21 0612)    LOS: 2 days   Time spent: 35 mins  Kaia Depaolis Wynetta Emery, MD How to contact the [facility] Attending or Consulting provider [place] or covering provider during after hours [place], for this patient?  Check the care team in [facility] and look for a) attending/consulting [facility] provider listed and b) the [facility] team listed Log into [email or web address] and use [place]'s universal password to access. If you do not have the password, please contact the hospital operator. Locate the [facility] provider you are looking for under [facility] and page to a number that you can be directly reached. If you still have difficulty reaching the provider, please page the [facility] (Director on Call) for the Hospitalists listed on amion for assistance.  08/28/2021, 1:01 PM

## 2021-08-28 NOTE — Progress Notes (Signed)
[facility] Progress Note  2 Days Post-Op  Subjective: Patient seen and examined.  He is resting comfortably in bed.  He states that his abdominal pain is feeling better than it was previously.  He denies nausea and vomiting.  His NG tube has had decreased output in the last 24 hours, about 40 cc this morning, nothing documented yesterday.  The fluid in the canister appears to be gastric contents.  Objective: Vital signs in last 24 hours: Temp:  [98 F (36.7 C)-99.7 F (37.6 C)] 99.6 F (37.6 C) (02/20 1226) Pulse Rate:  [80-99] 80 (02/20 1226) Resp:  [18-22] 18 (02/20 1226) BP: (132-148)/(77-89) 148/89 (02/20 1226) SpO2:  [91 %-96 %] 93 % (02/20 1226) Last BM Date :  (PTA)  Intake/Output from previous day: 02/19 0701 - 02/20 0700 In: 700 [IV Piggyback:700] Out: 1300 [Urine:1300] Intake/Output this shift: No intake/output data recorded.  General appearance: alert, cooperative, and no distress Nose: NG tube in place GI: Abdomen soft, nondistended, no percussion tenderness, mild tenderness to palpation along the incision site, no rigidity, guarding, rebound tenderness; midline incision C/D/I with honeycomb dressing in place, minimal dried bloody strikethrough noted  Lab Results:  Recent Labs    08/27/21 0508 08/28/21 0528  WBC 13.1* 16.3*  HGB 12.6* 11.7*  HCT 38.3* 36.0*  PLT 250 252   BMET Recent Labs    08/27/21 0508 08/28/21 0528  NA 136 135  K 4.6 4.2  CL 102 100  CO2 28 27  GLUCOSE 113* 99  BUN 24* 21  CREATININE 1.30* 1.10  CALCIUM 8.4* 8.7*   PT/INR No results for input(s): LABPROT, INR in the last 72 hours.  Studies/Results: DG Chest 1V REPEAT Same Day  Result Date: 08/26/2021 CLINICAL DATA:  NG tube placement EXAM: CHEST - 1 VIEW SAME DAY COMPARISON:  Previous studies including the examination done earlier today FINDINGS: There is interval repositioning of enteric tube with its tip in the stomach. Skin staples are seen in the  abdomen. Cardiac size is within normal limits. Increased density is seen in the medial left lower lung fields. In the CT done earlier today there was no definite focal infiltrate in the medial left lower lung fields. There is soft tissue fullness in the retrocardiac region which may be due to tortuous thoracic aorta or small fixed hiatal hernia. Costophrenic angles are clear. There is no pneumothorax. Degenerative changes are noted in right shoulder. There is large smooth marginated calcification adjacent to the inferior margin of glenoid on the right side possibly a loose body or old ununited fracture. IMPRESSION: There is interval repositioning of enteric tube with its tip in the stomach. There is increased opacity in the medial left lower lung fields suggesting atelectasis. Possible small hiatal hernia. Electronically Signed   By: Elmer Picker M.D.   On: 08/26/2021 15:42   DG CHEST PORT 1 VIEW  Result Date: 08/26/2021 CLINICAL DATA:  NG tube placement EXAM: PORTABLE CHEST 1 VIEW COMPARISON:  03/15/2020 FINDINGS: Esophagogastric tube is positioned folded and doubled back over the esophagus. The heart size and mediastinal contours are within normal limits. Both lungs are clear. The visualized skeletal structures are unremarkable. IMPRESSION: 1. Esophagogastric tube is positioned folded and doubled back over the esophagus. Recommend repositioning. 2. No acute abnormality of the lungs in AP portable projection. Electronically Signed   By: Delanna Ahmadi M.D.   On: 08/26/2021 13:48    Anti-infectives: Anti-infectives (From admission, onward)    Start  Dose/Rate Route Frequency Ordered Stop   08/27/21 0900  fluconazole (DIFLUCAN) IVPB 200 mg        200 mg 100 mL/hr over 60 Minutes Intravenous Every 24 hours 08/26/21 0813     08/27/21 0600  cefTRIAXone (ROCEPHIN) 1 g in sodium chloride 0.9 % 100 mL IVPB        1 g 200 mL/hr over 30 Minutes Intravenous Every 24 hours 08/26/21 1122     08/26/21  1400  metroNIDAZOLE (FLAGYL) IVPB 500 mg        500 mg 100 mL/hr over 60 Minutes Intravenous Every 8 hours 08/26/21 0736     08/26/21 0813  metroNIDAZOLE (FLAGYL) 500 MG/100ML IVPB       Note to Pharmacy: Lear Ng S: cabinet override      08/26/21 0813 08/26/21 2208   08/26/21 0730  fluconazole (DIFLUCAN) IVPB 800 mg  Status:  Discontinued        800 mg 200 mL/hr over 120 Minutes Intravenous Every 24 hours 08/26/21 0717 08/26/21 0813   08/26/21 0615  cefTRIAXone (ROCEPHIN) 2 g in sodium chloride 0.9 % 100 mL IVPB        2 g 200 mL/hr over 30 Minutes Intravenous  Once 08/26/21 0604 08/26/21 0709   08/26/21 0615  metroNIDAZOLE (FLAGYL) IVPB 500 mg        500 mg 100 mL/hr over 60 Minutes Intravenous  Once 08/26/21 0604 08/26/21 8786       Assessment/Plan:  Patient is 66 year old male who is status post exploratory laparotomy, gastrorrhaphy with Phillip Heal patch repair, incisional hernia repair on 2/18 for gastroduodenal ulcer perforation incarcerated incisional hernia.  -Leukocytosis increased to 16.3 from 13.1 yesterday, will continue to monitor -Continue Rocephin, Flagyl, and Diflucan -Peritoneal cultures with no organisms noted -Given minimal NG tube output at this time, will order upper GI to evaluate for leak at repair site.  If imaging study negative for leak, will plan to remove NG tube and initiate clear liquid diet -Maintain NG at this time -NPO -PRN pain medications and antiemetics -IVF per primary team -Foley catheter removed per primary team yesterday -Appreciate hospitalist recommendations   LOS: 2 days    [name] [name] 08/28/2021

## 2021-08-28 NOTE — Progress Notes (Signed)
NG tube reinforced and continues green out put, so far about 40 mls today.  Pain rated a 3 except when coughs and goes to 7 and receiving 0.5 dilaudid about Q3 hours.  Honeycomb dressing dry and intact.  Sits on side of bed for voiding in urinal.  Family at bedside.

## 2021-08-28 NOTE — Progress Notes (Signed)
NG tube removed and 150 mls emptied from canister.  Requested sprite and sipping on that now. Wife at bedside.

## 2021-08-29 ENCOUNTER — Other Ambulatory Visit: Payer: Self-pay | Admitting: Nurse Practitioner

## 2021-08-29 DIAGNOSIS — K631 Perforation of intestine (nontraumatic): Secondary | ICD-10-CM | POA: Diagnosis not present

## 2021-08-29 DIAGNOSIS — K5790 Diverticulosis of intestine, part unspecified, without perforation or abscess without bleeding: Secondary | ICD-10-CM | POA: Diagnosis not present

## 2021-08-29 DIAGNOSIS — Z905 Acquired absence of kidney: Secondary | ICD-10-CM | POA: Diagnosis not present

## 2021-08-29 DIAGNOSIS — F321 Major depressive disorder, single episode, moderate: Secondary | ICD-10-CM

## 2021-08-29 DIAGNOSIS — J020 Streptococcal pharyngitis: Secondary | ICD-10-CM | POA: Diagnosis not present

## 2021-08-29 DIAGNOSIS — N1832 Chronic kidney disease, stage 3b: Secondary | ICD-10-CM | POA: Diagnosis not present

## 2021-08-29 LAB — COMPREHENSIVE METABOLIC PANEL WITH GFR
ALT: 16 U/L (ref 0–44)
AST: 15 U/L (ref 15–41)
Albumin: 2.7 g/dL — ABNORMAL LOW (ref 3.5–5.0)
Alkaline Phosphatase: 56 U/L (ref 38–126)
Anion gap: 8 (ref 5–15)
BUN: 19 mg/dL (ref 8–23)
CO2: 28 mmol/L (ref 22–32)
Calcium: 8.5 mg/dL — ABNORMAL LOW (ref 8.9–10.3)
Chloride: 100 mmol/L (ref 98–111)
Creatinine, Ser: 0.97 mg/dL (ref 0.61–1.24)
GFR, Estimated: >60 mL/min
Glucose, Bld: 100 mg/dL — ABNORMAL HIGH (ref 70–99)
Potassium: 3.8 mmol/L (ref 3.5–5.1)
Sodium: 136 mmol/L (ref 135–145)
Total Bilirubin: 0.7 mg/dL (ref 0.3–1.2)
Total Protein: 5.7 g/dL — ABNORMAL LOW (ref 6.5–8.1)

## 2021-08-29 LAB — CBC WITH DIFFERENTIAL/PLATELET
Abs Immature Granulocytes: 0.17 10*3/uL — ABNORMAL HIGH (ref 0.00–0.07)
Basophils Absolute: 0 10*3/uL (ref 0.0–0.1)
Basophils Relative: 0 %
Eosinophils Absolute: 0 10*3/uL (ref 0.0–0.5)
Eosinophils Relative: 0 %
HCT: 34.6 % — ABNORMAL LOW (ref 39.0–52.0)
Hemoglobin: 11.1 g/dL — ABNORMAL LOW (ref 13.0–17.0)
Immature Granulocytes: 1 %
Lymphocytes Relative: 5 %
Lymphs Abs: 0.7 10*3/uL (ref 0.7–4.0)
MCH: 31.1 pg (ref 26.0–34.0)
MCHC: 32.1 g/dL (ref 30.0–36.0)
MCV: 96.9 fL (ref 80.0–100.0)
Monocytes Absolute: 0.9 10*3/uL (ref 0.1–1.0)
Monocytes Relative: 7 %
Neutro Abs: 12 10*3/uL — ABNORMAL HIGH (ref 1.7–7.7)
Neutrophils Relative %: 87 %
Platelets: 246 10*3/uL (ref 150–400)
RBC: 3.57 MIL/uL — ABNORMAL LOW (ref 4.22–5.81)
RDW: 13.2 % (ref 11.5–15.5)
WBC: 13.8 10*3/uL — ABNORMAL HIGH (ref 4.0–10.5)
nRBC: 0 % (ref 0.0–0.2)

## 2021-08-29 LAB — SURGICAL PATHOLOGY

## 2021-08-29 LAB — MAGNESIUM: Magnesium: 1.8 mg/dL (ref 1.7–2.4)

## 2021-08-29 MED ORDER — MAGNESIUM SULFATE 50 % IJ SOLN: 3.0000 g | Freq: Once | INTRAVENOUS | Status: DC

## 2021-08-29 MED ORDER — AMOXICILLIN 250 MG PO CAPS
500.0000 mg | ORAL_CAPSULE | Freq: Once | ORAL | Status: AC
  Administered 2021-08-30: 500 mg via ORAL
  Filled 2021-08-29: qty 2

## 2021-08-29 MED ORDER — DIPHENHYDRAMINE HCL 50 MG/ML IJ SOLN: 25.0000 mg | Freq: Once | INTRAMUSCULAR | Status: DC | PRN

## 2021-08-29 MED ORDER — BISACODYL 10 MG RE SUPP
10.0000 mg | Freq: Once | RECTAL | Status: AC
  Administered 2021-08-29: 10 mg via RECTAL
  Filled 2021-08-29: qty 1

## 2021-08-29 MED ORDER — SODIUM CHLORIDE 0.9 % IV SOLN
2.0000 g | INTRAVENOUS | Status: DC
  Administered 2021-08-29 – 2021-08-30 (×2): 2 g via INTRAVENOUS
  Filled 2021-08-29 (×2): qty 20

## 2021-08-29 MED ORDER — MAGNESIUM SULFATE 2 GM/50ML IV SOLN
2.0000 g | Freq: Once | INTRAVENOUS | Status: AC
Start: 2021-08-29 — End: 2021-08-29
  Administered 2021-08-29: 2 g via INTRAVENOUS
  Filled 2021-08-29: qty 50

## 2021-08-29 MED ORDER — MAGNESIUM SULFATE 2 GM/50ML IV SOLN
2.0000 g | Freq: Once | INTRAVENOUS | Status: DC
  Filled 2021-08-29: qty 50

## 2021-08-29 MED ORDER — EPINEPHRINE 0.3 MG/0.3ML IJ SOAJ
0.3000 mg | Freq: Once | INTRAMUSCULAR | Status: DC | PRN
Start: 2021-08-30 — End: 2021-08-31

## 2021-08-29 MED ORDER — MAGNESIUM SULFATE IN D5W 1-5 GM/100ML-% IV SOLN
1.0000 g | Freq: Once | INTRAVENOUS | Status: AC
  Administered 2021-08-29: 1 g via INTRAVENOUS
  Filled 2021-08-29: qty 100

## 2021-08-29 NOTE — Progress Notes (Signed)
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  There was no SOB. No nausea, vomiting or diarrhea.  This was a severe frightening pain that he has never experienced and it continues.  Any movement exacerbates the pain.  He has not been able to eat or drink. He has been evaluated in the ED his EKG did not show acute changes.  He had a mild leukocytosis and mild lipase elevation.  CT scan shows perforated viscus with free air present and an incarcerated umbilical hernia with inflammatory changes around the gastroduodenal junction.  Pt was started on IV protonix and IV antibiotics and IV fluids.  Surgery consulted and requested medical admission and plan is for patient to go for exploratory lap later today.    08/27/2021:  POD #1 - Pt having some pain, but controlled with medication.  No BS. NG remains in place.  Surgery planning for upper GI study on 2/20 to evaluate repair.   08/28/2021: POD #2 - Pt continues to feel better.  Abd pain improving. No BM. Bowel sounds heard.  Upper GI study ordered today by surgery team.  NG remain in place.    08/29/2021: POD #3 - NGT out since 2/20. Tolerating clears.  No bowel movement yet. Pain improving.  WBC coming down.     Assessment and Plan: * Perforated bowel ([place])- (present on admission) Surgery team consulted IV pain management IV fluids reduced to [facility].  POD #3 s/p Exp lap on 2/18 Upper GI study 2/20 reassuring and NG removed and clears  started Surgery advancing to full liquid diet 2/21.  Discussed with pharm D and ceftriaxone dose should be 2 gm given history of bowel perforation.   Incarcerated incisional hernia- (present on admission) POD #3 s/p repair on 2/18 by surgical team  Pneumoperitoneum- (present on admission) Pt is postop day #3  s/p gastroduodenal repair.  Upper GI study on 2/20 reassuring and diet started.  Advancing to full liquid diet 2/21.    Sudden onset of severe abdominal pain- (present on admission) IV pain management as ordered.  Pt went to OR 2/18 for exp lap with gastroduodenal ulcer repair.  Abdominal pain much improved to nearly resolved.  Hypomagnesemia Additional IV replacement ordered 2/21 (3 gm IV) Recheck in AM  Severe Diverticulosis- (present on admission) Noted on CT scan.  He may benefit from outpatient GI follow up.   Leukocytosis- (present on admission) WBC trending down to 13.  No fever, cultures no growth.  Recheck in AM.   CKD (chronic kidney disease) stage 3, GFR 30-59 ml/min (HCC)- (present on admission) Creatinine trended down to 0.97.     Smoker- (present on admission) Nicotine patch ordered Counseled on urgent need for tobacco cessation at bedside, pt verbalized understanding  H/O left nephrectomy Stable   History of depression Stable on home meds.   Thrombocytosis- (present on admission) Reactive but resolved now.  History of renal pelvis cancer S/p left nephroureterectomy in 2020 by Dr. Tresa Moore  Hyperlipidemia- (present  on admission) Reportedly diet controlled   DVT prophylaxis: SCDs Code Status: Full  Family Communication: bedside update 2/18, 2/20 Disposition: Status is: Inpatient Remains inpatient appropriate because: IV pain management, IV antibiotics, severity of illness  Consultants:  Surgery ([facility]) Procedures:  08/26/21:  Exploratory laparotomy with gastrorrhaphy and Phillip Heal patch repair, repair of incarcerated incisional  hernia Antimicrobials:  2/18 >> fluconazole, metronidazole, ceftriaxone (increased to 2gm 2/21) Subjective: No flatus or bm yet, tolerated clear liquid diet.    Objective: Vitals:   08/28/21 2057 08/29/21 0325 08/29/21 0840 08/29/21 1240  BP: 131/81 127/78  115/69  Pulse: 69 68  (!) 54  Resp: 15 15  15   Temp: 99.3 F (37.4 C) 97.9 F (36.6 C)  98.6 F (37 C)  TempSrc: Oral   Oral  SpO2: 98% 97% 95% 96%  Weight:      Height:        Intake/Output Summary (Last 24 hours) at 08/29/2021 1332 Last data filed at 08/29/2021 0900 Gross per 24 hour  Intake 2500 ml  Output 900 ml  Net 1600 ml   Filed Weights   08/26/21 1125 08/27/21 0500  Weight: 73.4 kg 73.3 kg   Examination:  General exam: Appears calm and comfortable, NGT in place.  Respiratory system: NGT suction sounds heard, otherwise lungs clear to auscultation. Respiratory effort normal. Cardiovascular system: normal S1 & S2 heard. No JVD, murmurs, rubs, gallops or clicks. No pedal edema. Gastrointestinal system: Abdomen is nondistended, soft and midline wound clean and dry.  Minimal bowel sounds heard. Central nervous system: Alert and oriented. No focal neurological deficits. Extremities: Symmetric 5 x 5 power. Skin: No rashes, lesions or ulcers. Psychiatry: Judgement and insight appear normal. Mood & affect appropriate.   Data Reviewed: I have personally reviewed following labs and imaging studies  CBC: Recent Labs  Lab 08/26/21 0300 08/27/21 0508 08/28/21 0528 08/29/21 0502  WBC 10.8* 13.1* 16.3* 13.8*  NEUTROABS 5.4 11.2* 14.2* 12.0*  HGB 15.7 12.6* 11.7* 11.1*  HCT 49.1 38.3* 36.0* 34.6*  MCV 95.3 96.0 96.0 96.9  PLT 431* 250 252 706    Basic Metabolic Panel: Recent Labs  Lab 08/26/21 0300 08/27/21 0508 08/28/21 0528 08/29/21 0502  NA 135 136 135 136  K 4.3 4.6 4.2 3.8  CL 99 102 100 100  CO2 23 28 27 28   GLUCOSE 135* 113* 99 100*  BUN 28* 24* 21 19  CREATININE 1.44* 1.30* 1.10 0.97  CALCIUM  9.3 8.4* 8.7* 8.5*  MG  --  1.6* 2.1 1.8    CBG: No results for input(s): GLUCAP in the last 168 hours.  Recent Results (from the past 240 hour(s))  Resp Panel by RT-PCR (Flu A&B, Covid) Nasopharyngeal Swab     Status: None   Collection Time: 08/26/21  6:11 AM   Specimen: Nasopharyngeal Swab; Nasopharyngeal(NP) swabs in vial transport medium  Result Value Ref Range Status   SARS Coronavirus 2 by RT PCR NEGATIVE NEGATIVE Final    Comment: (NOTE) SARS-CoV-2 target nucleic acids are NOT DETECTED.  The SARS-CoV-2 RNA is generally detectable in upper respiratory specimens during the acute phase of infection. The lowest concentration of SARS-CoV-2 viral copies this assay can detect is 138 copies/mL. A negative result does not preclude SARS-Cov-2 infection and should not be used as the sole basis for treatment or other patient management decisions. A negative result may occur with  improper specimen collection/handling, submission of specimen other than nasopharyngeal swab, presence of viral mutation(s) within the areas targeted  by this assay, and inadequate number of viral copies(<138 copies/mL). A negative result must be combined with clinical observations, patient history, and epidemiological information. The expected result is Negative.  Fact Sheet for Patients:  EntrepreneurPulse.com.au  Fact Sheet for Healthcare Providers:  IncredibleEmployment.be  This test is no t yet approved or cleared by the [place] FDA and  has been authorized for detection and/or diagnosis of SARS-CoV-2 by FDA under an Emergency Use Authorization (EUA). This EUA will remain  in effect (meaning this test can be used) for the duration of the COVID-19 declaration under Section 564(b)(1) of the Act, 21 U.S.C.section 360bbb-3(b)(1), unless the authorization is terminated  or revoked sooner.       Influenza A by PCR NEGATIVE NEGATIVE Final   Influenza B by PCR  NEGATIVE NEGATIVE Final    Comment: (NOTE) The Xpert Xpress SARS-CoV-2/FLU/RSV plus assay is intended as an aid in the diagnosis of influenza from Nasopharyngeal swab specimens and should not be used as a sole basis for treatment. Nasal washings and aspirates are unacceptable for Xpert Xpress SARS-CoV-2/FLU/RSV testing.  Fact Sheet for Patients: EntrepreneurPulse.com.au  Fact Sheet for Healthcare Providers: IncredibleEmployment.be  This test is not yet approved or cleared by the [place] FDA and has been authorized for detection and/or diagnosis of SARS-CoV-2 by FDA under an Emergency Use Authorization (EUA). This EUA will remain in effect (meaning this test can be used) for the duration of the COVID-19 declaration under Section 564(b)(1) of the Act, 21 U.S.C. section 360bbb-3(b)(1), unless the authorization is terminated or revoked.  Performed at [facility], [address], [place], [place] [address]   Aerobic/Anaerobic Culture w Gram Stain (surgical/deep wound)     Status: None (Preliminary result)   Collection Time: 08/26/21  9:27 AM   Specimen: PATH Other; Tissue  Result Value Ref Range Status   Specimen Description   Final    TISSUE Performed at [facility], [address] [place], [place] [address]    Special Requests   Final    PERITONEAL Performed at [facility], [address], [place], [place] [address]    Gram Stain   Final    RARE WBC PRESENT, PREDOMINANTLY PMN NO ORGANISMS SEEN Performed at [facility] [address] [place], [place] [address]    Culture   Final    RARE STAPHYLOCOCCUS AUREUS RARE STREPTOCOCCUS MITIS/ORALIS RARE ESCHERICHIA COLI NO ANAEROBES ISOLATED; CULTURE IN PROGRESS FOR 5 DAYS    Report Status PENDING  Incomplete   Organism ID, Bacteria STAPHYLOCOCCUS AUREUS  Final   Organism ID, Bacteria STREPTOCOCCUS MITIS/ORALIS  Final   Organism ID, Bacteria ESCHERICHIA COLI  Final       Susceptibility   Escherichia coli - MIC*    AMPICILLIN 4 SENSITIVE Sensitive     CEFAZOLIN <=4 SENSITIVE Sensitive     CEFEPIME <=0.12 SENSITIVE Sensitive     CEFTAZIDIME <=1 SENSITIVE Sensitive     CEFTRIAXONE <=0.25 SENSITIVE Sensitive     CIPROFLOXACIN <=0.25 SENSITIVE Sensitive     GENTAMICIN 4 SENSITIVE Sensitive     IMIPENEM 1 SENSITIVE Sensitive     TRIMETH/SULFA <=20 SENSITIVE Sensitive     AMPICILLIN/SULBACTAM <=2 SENSITIVE Sensitive     PIP/TAZO <=4 SENSITIVE Sensitive     * RARE ESCHERICHIA COLI   Staphylococcus aureus - MIC*    CIPROFLOXACIN <=0.5 SENSITIVE Sensitive     ERYTHROMYCIN RESISTANT Resistant     GENTAMICIN <=0.5 SENSITIVE Sensitive     OXACILLIN <=0.25 SENSITIVE Sensitive  TETRACYCLINE <=1 SENSITIVE Sensitive     VANCOMYCIN 1 SENSITIVE Sensitive     TRIMETH/SULFA <=10 SENSITIVE Sensitive     CLINDAMYCIN RESISTANT Resistant     RIFAMPIN <=0.5 SENSITIVE Sensitive     Inducible Clindamycin POSITIVE Resistant     * RARE STAPHYLOCOCCUS AUREUS   Streptococcus mitis/oralis - MIC*    TETRACYCLINE >=16 RESISTANT Resistant     VANCOMYCIN 0.25 SENSITIVE Sensitive     CLINDAMYCIN >=1 RESISTANT Resistant     PENICILLIN Value in next row Intermediate      INTERMEDIATE0.25    CEFTRIAXONE Value in next row Sensitive      SENSITIVE0.5    * RARE STREPTOCOCCUS MITIS/ORALIS  Culture, blood (routine x 2)     Status: None (Preliminary result)   Collection Time: 08/26/21 12:37 PM   Specimen: BLOOD RIGHT HAND  Result Value Ref Range Status   Specimen Description   Final    BLOOD RIGHT HAND BOTTLES DRAWN AEROBIC AND ANAEROBIC Performed at [facility], [address] [place], [place] [address]    Special Requests   Final    Blood Culture results may not be optimal due to an excessive volume of blood received in culture bottles Performed at [facility], [address], [place], [place] [address]    Culture  Setup Time   Final    CORRECTED RESULTS NO ORGANISMS  SEEN PREVIOUSLY REPORTED AS: GRAM POSITIVE COCCI ANAEROBIC BOTTLE ONLY CORRECTED RESULTS CALLED TOPark Breed [id number] FCP Performed at [facility], [address], [place], [place] [address]    Culture   Final    NO GROWTH 3 DAYS Performed at [facility], [address], [place],  [address]    Report Status PENDING  Incomplete  Blood Culture ID Panel (Reflexed)     Status: None   Collection Time: 08/26/21 12:37 PM  Result Value Ref Range Status   Enterococcus faecalis NOT DETECTED NOT DETECTED Final   Enterococcus Faecium NOT DETECTED NOT DETECTED Final   Listeria monocytogenes NOT DETECTED NOT DETECTED Final   Staphylococcus species NOT DETECTED NOT DETECTED Final   Staphylococcus aureus (BCID) NOT DETECTED NOT DETECTED Final   Staphylococcus epidermidis NOT DETECTED NOT DETECTED Final   Staphylococcus lugdunensis NOT DETECTED NOT DETECTED Final   Streptococcus species NOT DETECTED NOT DETECTED Final   Streptococcus agalactiae NOT DETECTED NOT DETECTED Final   Streptococcus pneumoniae NOT DETECTED NOT DETECTED Final   Streptococcus pyogenes NOT DETECTED NOT DETECTED Final   A.calcoaceticus-baumannii NOT DETECTED NOT DETECTED Final   Bacteroides fragilis NOT DETECTED NOT DETECTED Final   Enterobacterales NOT DETECTED NOT DETECTED Final   Enterobacter cloacae complex NOT DETECTED NOT DETECTED Final   Escherichia coli NOT DETECTED NOT DETECTED Final   Klebsiella aerogenes NOT DETECTED NOT DETECTED Final   Klebsiella oxytoca NOT DETECTED NOT DETECTED Final   Klebsiella pneumoniae NOT DETECTED NOT DETECTED Final   Proteus species NOT DETECTED NOT DETECTED Final   Salmonella species NOT DETECTED NOT DETECTED Final   Serratia marcescens NOT DETECTED NOT DETECTED Final   Haemophilus influenzae NOT DETECTED NOT DETECTED Final   Neisseria meningitidis NOT DETECTED NOT DETECTED Final   Pseudomonas aeruginosa NOT DETECTED NOT DETECTED Final   Stenotrophomonas  maltophilia NOT DETECTED NOT DETECTED Final   Candida albicans NOT DETECTED NOT DETECTED Final   Candida auris NOT DETECTED NOT DETECTED Final   Candida glabrata NOT DETECTED NOT DETECTED Final   Candida krusei NOT DETECTED NOT DETECTED Final  Candida parapsilosis NOT DETECTED NOT DETECTED Final   Candida tropicalis NOT DETECTED NOT DETECTED Final   Cryptococcus neoformans/gattii NOT DETECTED NOT DETECTED Final    Comment: Performed at [facility], [address], [place], [place] [address]  Culture, blood (routine x 2)     Status: None (Preliminary result)   Collection Time: 08/26/21 12:49 PM   Specimen: BLOOD LEFT HAND  Result Value Ref Range Status   Specimen Description   Final    BLOOD LEFT HAND BOTTLES DRAWN AEROBIC AND ANAEROBIC   Special Requests   Final    Blood Culture results may not be optimal due to an excessive volume of blood received in culture bottles   Culture   Final    NO GROWTH 3 DAYS Performed at [facility], [address], [place],  [address]    Report Status PENDING  Incomplete     Radiology Studies: DG UGI W SINGLE CM (SOL OR THIN BA)  Result Date: 08/28/2021 CLINICAL DATA:  Perforated ulcer anterior wall duodenal bulb post repair EXAM: WATER SOLUBLE UPPER GI SERIES TECHNIQUE: Single-column upper GI series was performed using water soluble contrast. CONTRAST:  181mL OMNIPAQUE IOHEXOL 300 MG/ML SOLN via nasogastric tube COMPARISON:  CT abdomen and pelvis 08/26/2021 FLUOROSCOPY: Fluoroscopy Time:  2 minutes 12 seconds Radiation Exposure Index (if provided by the fluoroscopic device): 136.5 mGy Number of Acquired Spot Images: 2 + multiple fluoroscopic screen captures FINDINGS: Normal bowel gas pattern. Nasogastric tube projects over stomach. Normal gastric distension without mass or ulceration. No gastric outlet obstruction. Wall thickening of duodenal bulb consistent with perforated ulcer and surgery. Duodenal sweep normal appearance. Normal position  of ligament of Treitz. No extraluminal contrast is seen to suggest leak. IMPRESSION: Wall thickening at duodenal bulb consistent with history of perforated ulcer and Graham patch. No evidence of duodenal leak post repair of perforated duodenal ulcer. Electronically Signed   By: Lavonia Dana M.D.   On: 08/28/2021 13:56    Scheduled Meds:  bisacodyl  10 mg Rectal Once   pantoprazole (PROTONIX) IV  40 mg Intravenous Q12H   Continuous Infusions:  cefTRIAXone (ROCEPHIN)  IV     fluconazole (DIFLUCAN) IV 200 mg (08/29/21 7741)   lactated ringers 100 mL/hr at 08/29/21 2878   magnesium sulfate bolus IVPB     magnesium sulfate bolus IVPB     metronidazole 500 mg (08/29/21 0405)    LOS: 3 days   Time spent: 55 mins  Faige Seely Wynetta Emery, MD How to contact the [facility] Attending or Consulting provider [place] or covering provider during after hours [place], for this patient?  Check the care team in [facility] and look for a) attending/consulting [facility] provider listed and b) the [facility] team listed Log into [email or web address] and use [place]'s universal password to access. If you do not have the password, please contact the hospital operator. Locate the [facility] provider you are looking for under [facility] and page to a number that you can be directly reached. If you still have difficulty reaching the provider, please page the [facility] (Director on Call) for the Hospitalists listed on amion for assistance.  08/29/2021, 1:32 PM

## 2021-08-29 NOTE — Progress Notes (Signed)
[facility] Progress Note  3 Days Post-Op  Subjective: Patient seen and examined.  He is resting comfortably in bed.  He is currently receiving breathing treatment.  He tolerated his clear liquid diet without nausea and vomiting.  He denies any increase in his abdominal pain.  He also denies flatus or bowel movement since surgery.  Objective: Vital signs in last 24 hours: Temp:  [97.9 F (36.6 C)-99.3 F (37.4 C)] 98.6 F (37 C) (02/21 1240) Pulse Rate:  [54-69] 54 (02/21 1240) Resp:  [15] 15 (02/21 1240) BP: (115-131)/(69-81) 115/69 (02/21 1240) SpO2:  [95 %-98 %] 96 % (02/21 1240) Last BM Date :  (PTA)  Intake/Output from previous day: 02/20 0701 - 02/21 0700 In: 2260 [P.O.:960; I.V.:1000; IV Piggyback:300] Out: 1400 [Urine:1250; Emesis/NG output:150] Intake/Output this shift: Total I/O In: 240 [P.O.:240] Out: 300 [Urine:300]  General appearance: alert, cooperative, and no distress GI: Abdomen soft, nondistended, no percussion tenderness, minimal tenderness to palpation of midline incision, no rigidity, guarding, rebound tenderness; incisions C/D/I with honeycomb dressing in place, small amount of old dried blood.  Lab Results:  Recent Labs    08/28/21 0528 08/29/21 0502  WBC 16.3* 13.8*  HGB 11.7* 11.1*  HCT 36.0* 34.6*  PLT 252 246   BMET Recent Labs    08/28/21 0528 08/29/21 0502  NA 135 136  K 4.2 3.8  CL 100 100  CO2 27 28  GLUCOSE 99 100*  BUN 21 19  CREATININE 1.10 0.97  CALCIUM 8.7* 8.5*   PT/INR No results for input(s): LABPROT, INR in the last 72 hours.  Studies/Results: DG UGI W SINGLE CM (SOL OR THIN BA)  Result Date: 08/28/2021 CLINICAL DATA:  Perforated ulcer anterior wall duodenal bulb post repair EXAM: WATER SOLUBLE UPPER GI SERIES TECHNIQUE: Single-column upper GI series was performed using water soluble contrast. CONTRAST:  156mL OMNIPAQUE IOHEXOL 300 MG/ML SOLN via nasogastric tube COMPARISON:  CT abdomen and pelvis  08/26/2021 FLUOROSCOPY: Fluoroscopy Time:  2 minutes 12 seconds Radiation Exposure Index (if provided by the fluoroscopic device): 136.5 mGy Number of Acquired Spot Images: 2 + multiple fluoroscopic screen captures FINDINGS: Normal bowel gas pattern. Nasogastric tube projects over stomach. Normal gastric distension without mass or ulceration. No gastric outlet obstruction. Wall thickening of duodenal bulb consistent with perforated ulcer and surgery. Duodenal sweep normal appearance. Normal position of ligament of Treitz. No extraluminal contrast is seen to suggest leak. IMPRESSION: Wall thickening at duodenal bulb consistent with history of perforated ulcer and Graham patch. No evidence of duodenal leak post repair of perforated duodenal ulcer. Electronically Signed   By: Lavonia Dana M.D.   On: 08/28/2021 13:56    Anti-infectives: Anti-infectives (From admission, onward)    Start     Dose/Rate Route Frequency Ordered Stop   08/27/21 0900  fluconazole (DIFLUCAN) IVPB 200 mg        200 mg 100 mL/hr over 60 Minutes Intravenous Every 24 hours 08/26/21 0813     08/27/21 0600  cefTRIAXone (ROCEPHIN) 1 g in sodium chloride 0.9 % 100 mL IVPB        1 g 200 mL/hr over 30 Minutes Intravenous Every 24 hours 08/26/21 1122     08/26/21 1400  metroNIDAZOLE (FLAGYL) IVPB 500 mg        500 mg 100 mL/hr over 60 Minutes Intravenous Every 8 hours 08/26/21 0736     08/26/21 0813  metroNIDAZOLE (FLAGYL) 500 MG/100ML IVPB       Note to Pharmacy: Ronnald Ramp,  Lattie Haw S: cabinet override      08/26/21 0813 08/26/21 2208   08/26/21 0730  fluconazole (DIFLUCAN) IVPB 800 mg  Status:  Discontinued        800 mg 200 mL/hr over 120 Minutes Intravenous Every 24 hours 08/26/21 0717 08/26/21 0813   08/26/21 0615  cefTRIAXone (ROCEPHIN) 2 g in sodium chloride 0.9 % 100 mL IVPB        2 g 200 mL/hr over 30 Minutes Intravenous  Once 08/26/21 0604 08/26/21 0709   08/26/21 0615  metroNIDAZOLE (FLAGYL) IVPB 500 mg        500 mg 100  mL/hr over 60 Minutes Intravenous  Once 08/26/21 0604 08/26/21 6314       Assessment/Plan:  Patient is a 66 year old male who is status post exploratory laparotomy, gastrorrhaphy with Phillip Heal patch repair, incisional hernia repair on 2/18 for gastroduodenal ulcer perforation and incarcerated incisional hernia  -Patient underwent upper GI study yesterday, which did not demonstrate any concern for leak at duodenal perforation site -NG tube was removed yesterday, and clear liquid diet was initiated -Advance to FLD today -Leukocytosis improved to 13.8 from 16.3 yesterday -Continue Rocephin, Flagyl, and Diflucan -Continue Protonix -We will plan to treat patient for H. pylori upon discharge -PRN pain medications and antiemetics -Appreciate hospitalist recommendations -Hopeful for discharge in the next 24 to 48 hours pending diet tolerance and pain control   LOS: 3 days    Liberty Stead A Aki Burdin 08/29/2021

## 2021-08-30 DIAGNOSIS — K631 Perforation of intestine (nontraumatic): Secondary | ICD-10-CM | POA: Diagnosis not present

## 2021-08-30 LAB — CBC WITH DIFFERENTIAL/PLATELET
Abs Immature Granulocytes: 0.1 10*3/uL — ABNORMAL HIGH (ref 0.00–0.07)
Basophils Absolute: 0 10*3/uL (ref 0.0–0.1)
Basophils Relative: 0 %
Eosinophils Absolute: 0 10*3/uL (ref 0.0–0.5)
Eosinophils Relative: 0 %
HCT: 41.6 % (ref 39.0–52.0)
Hemoglobin: 13 g/dL (ref 13.0–17.0)
Immature Granulocytes: 1 %
Lymphocytes Relative: 7 %
Lymphs Abs: 0.9 10*3/uL (ref 0.7–4.0)
MCH: 29.7 pg (ref 26.0–34.0)
MCHC: 31.3 g/dL (ref 30.0–36.0)
MCV: 95.2 fL (ref 80.0–100.0)
Monocytes Absolute: 1.1 10*3/uL — ABNORMAL HIGH (ref 0.1–1.0)
Monocytes Relative: 9 %
Neutro Abs: 10.9 10*3/uL — ABNORMAL HIGH (ref 1.7–7.7)
Neutrophils Relative %: 83 %
Platelets: 361 10*3/uL (ref 150–400)
RBC: 4.37 MIL/uL (ref 4.22–5.81)
RDW: 13.1 % (ref 11.5–15.5)
WBC: 13.1 10*3/uL — ABNORMAL HIGH (ref 4.0–10.5)
nRBC: 0 % (ref 0.0–0.2)

## 2021-08-30 LAB — BASIC METABOLIC PANEL
Anion gap: 9 (ref 5–15)
BUN: 19 mg/dL (ref 8–23)
CO2: 29 mmol/L (ref 22–32)
Calcium: 8.6 mg/dL — ABNORMAL LOW (ref 8.9–10.3)
Chloride: 97 mmol/L — ABNORMAL LOW (ref 98–111)
Creatinine, Ser: 1.02 mg/dL (ref 0.61–1.24)
GFR, Estimated: >60 mL/min (ref >60)
Glucose, Bld: 106 mg/dL — ABNORMAL HIGH (ref 70–99)
Potassium: 4.5 mmol/L (ref 3.5–5.1)
Sodium: 135 mmol/L (ref 135–145)

## 2021-08-30 LAB — MAGNESIUM: Magnesium: 2.1 mg/dL (ref 1.7–2.4)

## 2021-08-30 MED ORDER — ACETAMINOPHEN 500 MG PO TABS
1000.0000 mg | ORAL_TABLET | Freq: Four times a day (QID) | ORAL | Status: DC
  Administered 2021-08-30 – 2021-08-31 (×4): 1000 mg via ORAL
  Filled 2021-08-30 (×4): qty 2

## 2021-08-30 MED ORDER — OXYCODONE HCL 5 MG PO TABS
5.0000 mg | ORAL_TABLET | ORAL | Status: DC | PRN
  Administered 2021-08-30 – 2021-08-31 (×4): 5 mg via ORAL
  Filled 2021-08-30 (×4): qty 1

## 2021-08-30 NOTE — Discharge Instructions (Signed)
Surgery Discharge Instructions  Activity  Resume light activity. No heavy lifting over 10 lbs or strenuous exercise.  Fluids and Diet Soft diet  Medications  If you have not had a bowel movement in 24 hours, take 2 tablespoons over the counter Milk of mag.             You May resume your blood thinners tomorrow (Aspirin, coumadin, or other).  You are being discharged with prescriptions for Opioid/Narcotic Medications: There are some specific considerations for these medications that you should know. Opioid Meds have risks & benefits. Addiction to these meds is always a concern with prolonged use Take medication only as directed Do not drive while taking narcotic pain medication Do not crush tablets or capsules Do not use a different container than medication was dispensed in Lock the container of medication in a cool, dry place out of reach of children and pets. Opioid medication can cause addiction Do not share with anyone else (this is a felony) Do not store medications for future use. Dispose of them properly.     Disposal:  Find a [employer] household drug take back site near you.  If you can't get to a drug take back site, use the recipe below as a last resort to dispose of expired, unused or unwanted drugs. Disposal  (Do not dispose chemotherapy drugs this way, talk to your prescribing doctor instead.) Step 1: Mix drugs (do not crush) with dirt, kitty litter, or used coffee grounds and add a small amount of water to dissolve any solid medications. Step 2: Seal drugs in plastic bag. Step 3: Place plastic bag in trash. Step 4: Take prescription container and scratch out personal information, then recycle or throw away.  Operative Site  You have skin staples in your midline incision.  These will be removed in office when you follow up. Ok to Games developer. Keep wound clean and dry. No baths or swimming. No lifting more than 10 pounds.  Contact Information: If you have  questions or concerns, please call our office, [phone], Monday- Thursday 8AM-5PM and Friday 8AM-12Noon.  If it is after hours or on the weekend, please call [facility] Main Number, [phone], and ask to speak to the surgeon on call for Dr. Okey Dupre at [facility].   SPECIFIC COMPLICATIONS TO WATCH FOR: Inability to urinate Fever over 101? F by mouth Nausea and vomiting lasting longer than 24 hours. Pain not relieved by medication ordered Swelling around the operative site Increased redness, warmth, hardness, around operative area Numbness, tingling, or cold fingers or toes Blood -soaked dressing, (small amounts of oozing may be normal) Increasing and progressive drainage from surgical area or exam site

## 2021-08-30 NOTE — Progress Notes (Signed)
[facility] Progress Note  4 Days Post-Op  Subjective: Patient seen and examined.  He is resting comfortably in bed.  He tolerated his full liquid diet without nausea and vomiting.  He had a bowel movement last night, and has been passing flatus since that time.  Pain is adequately controlled with pain medications.  Objective: Vital signs in last 24 hours: Temp:  [98.1 F (36.7 C)-98.6 F (37 C)] 98.6 F (37 C) (02/22 0552) Pulse Rate:  [54-76] 76 (02/22 0552) Resp:  [15-20] 18 (02/22 0552) BP: (115-154)/(69-93) 154/93 (02/22 0552) SpO2:  [93 %-97 %] 93 % (02/22 0552) Last BM Date : 08/29/21  Intake/Output from previous day: 02/21 0701 - 02/22 0700 In: 1699.4 [P.O.:720; I.V.:363.3; IV Piggyback:616.1] Out: 500 [Urine:500] Intake/Output this shift: No intake/output data recorded.  General appearance: alert, cooperative, and no distress GI: Abdomen soft, nondistended, no percussion tenderness, mild incisional tenderness to palpation, no rigidity, guarding, rebound tenderness; incision C/D/I with skin staples in place  Lab Results:  Recent Labs    08/29/21 0502 08/30/21 0437  WBC 13.8* 13.1*  HGB 11.1* 13.0  HCT 34.6* 41.6  PLT 246 361   BMET Recent Labs    08/29/21 0502 08/30/21 0437  NA 136 135  K 3.8 4.5  CL 100 97*  CO2 28 29  GLUCOSE 100* 106*  BUN 19 19  CREATININE 0.97 1.02  CALCIUM 8.5* 8.6*   PT/INR No results for input(s): LABPROT, INR in the last 72 hours.  Studies/Results: DG UGI W SINGLE CM (SOL OR THIN BA)  Result Date: 08/28/2021 CLINICAL DATA:  Perforated ulcer anterior wall duodenal bulb post repair EXAM: WATER SOLUBLE UPPER GI SERIES TECHNIQUE: Single-column upper GI series was performed using water soluble contrast. CONTRAST:  136mL OMNIPAQUE IOHEXOL 300 MG/ML SOLN via nasogastric tube COMPARISON:  CT abdomen and pelvis 08/26/2021 FLUOROSCOPY: Fluoroscopy Time:  2 minutes 12 seconds Radiation Exposure Index (if provided by  the fluoroscopic device): 136.5 mGy Number of Acquired Spot Images: 2 + multiple fluoroscopic screen captures FINDINGS: Normal bowel gas pattern. Nasogastric tube projects over stomach. Normal gastric distension without mass or ulceration. No gastric outlet obstruction. Wall thickening of duodenal bulb consistent with perforated ulcer and surgery. Duodenal sweep normal appearance. Normal position of ligament of Treitz. No extraluminal contrast is seen to suggest leak. IMPRESSION: Wall thickening at duodenal bulb consistent with history of perforated ulcer and Graham patch. No evidence of duodenal leak post repair of perforated duodenal ulcer. Electronically Signed   By: Lavonia Dana M.D.   On: 08/28/2021 13:56    Anti-infectives: Anti-infectives (From admission, onward)    Start     Dose/Rate Route Frequency Ordered Stop   08/30/21 1000  amoxicillin (AMOXIL) capsule 500 mg        500 mg Oral  Once 08/29/21 1446 08/30/21 0915   08/29/21 1800  cefTRIAXone (ROCEPHIN) 2 g in sodium chloride 0.9 % 100 mL IVPB        2 g 200 mL/hr over 30 Minutes Intravenous Every 24 hours 08/29/21 1324     08/27/21 0900  fluconazole (DIFLUCAN) IVPB 200 mg        200 mg 100 mL/hr over 60 Minutes Intravenous Every 24 hours 08/26/21 0813     08/27/21 0600  cefTRIAXone (ROCEPHIN) 1 g in sodium chloride 0.9 % 100 mL IVPB  Status:  Discontinued        1 g 200 mL/hr over 30 Minutes Intravenous Every 24 hours 08/26/21 1122 08/29/21 1324  08/26/21 1400  metroNIDAZOLE (FLAGYL) IVPB 500 mg        500 mg 100 mL/hr over 60 Minutes Intravenous Every 8 hours 08/26/21 0736     08/26/21 0813  metroNIDAZOLE (FLAGYL) 500 MG/100ML IVPB       Note to Pharmacy: Lear Ng S: cabinet override      08/26/21 0813 08/26/21 2208   08/26/21 0730  fluconazole (DIFLUCAN) IVPB 800 mg  Status:  Discontinued        800 mg 200 mL/hr over 120 Minutes Intravenous Every 24 hours 08/26/21 0717 08/26/21 0813   08/26/21 0615  cefTRIAXone (ROCEPHIN) 2  g in sodium chloride 0.9 % 100 mL IVPB        2 g 200 mL/hr over 30 Minutes Intravenous  Once 08/26/21 0604 08/26/21 0709   08/26/21 0615  metroNIDAZOLE (FLAGYL) IVPB 500 mg        500 mg 100 mL/hr over 60 Minutes Intravenous  Once 08/26/21 0604 08/26/21 4818       Assessment/Plan:  Patient is a 66 year old male who is status post exploratory laparotomy, gastrorrhaphy with Phillip Heal patch repair, incisional hernia repair on 2/18 for gastric duodenal ulcer perforation and incarcerated incisional hernia  -He had no evidence of leak on upper GI study on 2/20 -Advance to GI soft diet -Leukocytosis mildly improved to 13.1 from 13.8 yesterday -Continue Rocephin, Flagyl, and Diflucan while inpatient -Continue Protonix -We will plan to treat patient for H. pylori upon discharge with triple therapy.  Patient currently undergoing allergy testing for amoxicillin, had no complaints on evaluation -Upon discharge, he will need 14 days of pantoprazole 40 mg daily, clarithromycin 500 mg BID, and amoxicillin 1 g BID -Transitioned patient off of IV pain medications, currently receiving scheduled Tylenol and PRN Roxicodone -If patient is able to tolerate GI soft diet today and pain is controlled with oral pain medications, he will be stable for discharge from general surgery standpoint either this evening or tomorrow -Appreciate hospitalist recommendations   LOS: 4 days    Sharniece Gibbon A Vinay Ertl 08/30/2021

## 2021-08-30 NOTE — Progress Notes (Signed)
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  There was no SOB. No nausea, vomiting or diarrhea.  This was a severe frightening pain that he has never experienced and it continues.  Any movement exacerbates the pain.  He has not been able to eat or drink. He has been evaluated in the ED his EKG did not show acute changes.  He had a mild leukocytosis and mild lipase elevation.  CT scan shows perforated viscus with free air present and an incarcerated umbilical hernia with inflammatory changes around the gastroduodenal junction.  Pt was started on IV protonix and IV antibiotics and IV fluids.  Surgery consulted and requested medical admission and plan is for patient to go for exploratory lap later today.     08/27/2021:  POD #1 - Pt having some pain, but controlled with medication.  No BS. NG remains in place.  Surgery planning for upper GI study on 2/20 to evaluate repair.    08/28/2021: POD #2 - Pt continues to feel better.  Abd pain improving. No BM. Bowel sounds heard.  Upper GI study ordered today by surgery team.  NG remain in place.     08/29/2021: POD #3 - NGT out since 2/20. Tolerating clears.  No bowel movement yet. Pain improving.  WBC coming down.    08/30/2021: POD #4-diet advanced to soft today and weaning off oxygen.  PT to evaluate patient to determine safety on discharge.  Anticipate discharge in a.m.    Assessment & Plan:   Principal Problem:   Perforated bowel ([place]) Active Problems:   Hyperlipidemia   Smoker   CKD (chronic kidney disease) stage 3, GFR 30-59  ml/min (HCC)   History of renal pelvis cancer   Leukocytosis   Sudden onset of severe abdominal pain   Thrombocytosis   History of depression   Pneumoperitoneum   Severe Diverticulosis   H/O left nephrectomy   Incarcerated incisional hernia   Hypomagnesemia  Assessment and Plan: * Perforated bowel ([place])- (present on admission) Surgery team consulted IV pain management IV fluids reduced to [facility].  POD #4 s/p Exp lap on 2/18 Upper GI study 2/20 reassuring and NG removed and clears started Surgery advancing to soft diet 2/22.  Discussed with pharm D and ceftriaxone dose should be 2 gm given history of bowel perforation.  Anticipate discharge in a.m. if diet tolerated and after PT evaluation   Hypomagnesemia Repleted, reevaluate in a.m.  Incarcerated incisional hernia- (present on admission) POD #4 s/p repair on 2/18 by surgical team  H/O left nephrectomy Stable   Severe Diverticulosis- (present on admission) Noted on CT scan.  He may benefit from outpatient GI follow up.   Pneumoperitoneum- (present on admission) Pt is postop day #4  s/p gastroduodenal repair.  Upper GI study on 2/20 reassuring and diet started.  Advancing to soft diet 2/22    History of depression Stable on home meds.   Thrombocytosis- (present on admission) Reactive but resolved now.  Sudden onset of severe abdominal pain- (present on admission) IV pain management as ordered.  Pt went to OR  2/18 for exp lap with gastroduodenal ulcer repair.  Abdominal pain much improved to nearly resolved.  Leukocytosis- (present on admission) WBC trending down to 13.  No fever, cultures no growth.  Recheck in AM.   History of renal pelvis cancer S/p left nephroureterectomy in 2020 by Dr. Tresa Moore  CKD (chronic kidney disease) stage 3, GFR 30-59 ml/min ([place])- (present on admission) Creatinine trended down to approximately 1.     Smoker- (present on admission) Nicotine patch ordered Counseled on urgent need  for tobacco cessation at bedside, pt verbalized understanding  Hyperlipidemia- (present on admission) Reportedly diet controlled   DVT prophylaxis: SCDs Code Status: Full Family Communication: None at bedside Disposition Plan:  Status is: Inpatient Remains inpatient appropriate because: Need to advance diet and wean oxygen.   Consultants:  General surgery  Procedures:  08/26/21:  Exploratory laparotomy with gastrorrhaphy and Phillip Heal patch repair, repair of incarcerated incisional hernia  Antimicrobials:  2/18 >> fluconazole, metronidazole, ceftriaxone (increased to 2gm 2/21)   Subjective: Patient seen and evaluated today with no new acute complaints or concerns. No acute concerns or events noted overnight.  Objective: Vitals:   08/29/21 1240 08/29/21 2150 08/30/21 0552 08/30/21 1345  BP: 115/69 (!) 154/81 (!) 154/93 (!) 163/94  Pulse: (!) 54 69 76 69  Resp: 15 20 18 17   Temp: 98.6 F (37 C) 98.1 F (36.7 C) 98.6 F (37 C) (!) 97.4 F (36.3 C)  TempSrc: Oral Oral Oral Oral  SpO2: 96% 97% 93% 96%  Weight:      Height:        Intake/Output Summary (Last 24 hours) at 08/30/2021 1600 Last data filed at 08/30/2021 0431 Gross per 24 hour  Intake 533.78 ml  Output 200 ml  Net 333.78 ml   Filed Weights   08/26/21 1125 08/27/21 0500  Weight: 73.4 kg 73.3 kg    Examination:  General exam: Appears calm and comfortable  Respiratory system: Clear to auscultation. Respiratory effort normal.  Currently on 3 L nasal cannula oxygen Cardiovascular system: S1 & S2 heard, RRR.  Gastrointestinal system: Abdomen is soft, incisions clean dry and intact Central nervous system: Alert and awake Extremities: No edema Skin: No significant lesions noted Psychiatry: Flat affect.    Data Reviewed: I have personally reviewed following labs and imaging studies  CBC: Recent Labs  Lab 08/26/21 0300 08/27/21 0508 08/28/21 0528 08/29/21 0502 08/30/21 0437  WBC 10.8* 13.1* 16.3*  13.8* 13.1*  NEUTROABS 5.4 11.2* 14.2* 12.0* 10.9*  HGB 15.7 12.6* 11.7* 11.1* 13.0  HCT 49.1 38.3* 36.0* 34.6* 41.6  MCV 95.3 96.0 96.0 96.9 95.2  PLT 431* 250 252 246 741   Basic Metabolic Panel: Recent Labs  Lab 08/26/21 0300 08/27/21 0508 08/28/21 0528 08/29/21 0502 08/30/21 0437  NA 135 136 135 136 135  K 4.3 4.6 4.2 3.8 4.5  CL 99 102 100 100 97*  CO2 23 28 27 28 29   GLUCOSE 135* 113* 99 100* 106*  BUN 28* 24* 21 19 19   CREATININE 1.44* 1.30* 1.10 0.97 1.02  CALCIUM 9.3 8.4* 8.7* 8.5* 8.6*  MG  --  1.6* 2.1 1.8 2.1   GFR: Estimated Creatinine Clearance: 74.1 mL/min (by C-G formula based on SCr of 1.02 mg/dL). Liver Function Tests: Recent Labs  Lab 08/26/21 0300 08/27/21 0508 08/28/21 0528 08/29/21 0502  AST 21 22 18 15   ALT 15 22 17 16   ALKPHOS 84 53 61 56  BILITOT 0.5 0.6 0.8 0.7  PROT 7.9 6.1* 5.9* 5.7*  ALBUMIN 4.2 2.9* 2.8* 2.7*   Recent Labs  Lab 08/26/21 0300  LIPASE 61*   No results for input(s): AMMONIA in the last 168 hours. Coagulation Profile: No results for input(s): INR, PROTIME in the last 168 hours. Cardiac Enzymes: No results for input(s): CKTOTAL, CKMB, CKMBINDEX, TROPONINI in the last 168 hours. BNP (last 3 results) No results for input(s): PROBNP in the last 8760 hours. HbA1C: No results for input(s): HGBA1C in the last 72 hours. CBG: No results for input(s): GLUCAP in the last 168 hours. Lipid Profile: No results for input(s): CHOL, HDL, LDLCALC, TRIG, CHOLHDL, LDLDIRECT in the last 72 hours. Thyroid Function Tests: No results for input(s): TSH, T4TOTAL, FREET4, T3FREE, THYROIDAB in the last 72 hours. Anemia Panel: No results for input(s): VITAMINB12, FOLATE, FERRITIN, TIBC, IRON, RETICCTPCT in the last 72 hours. Sepsis Labs: Recent Labs  Lab 08/26/21 1238  LATICACIDVEN 1.3    Recent Results (from the past 240 hour(s))  Resp Panel by RT-PCR (Flu A&B, Covid) Nasopharyngeal Swab     Status: None   Collection Time:  08/26/21  6:11 AM   Specimen: Nasopharyngeal Swab; Nasopharyngeal(NP) swabs in vial transport medium  Result Value Ref Range Status   SARS Coronavirus 2 by RT PCR NEGATIVE NEGATIVE Final    Comment: (NOTE) SARS-CoV-2 target nucleic acids are NOT DETECTED.  The SARS-CoV-2 RNA is generally detectable in upper respiratory specimens during the acute phase of infection. The lowest concentration of SARS-CoV-2 viral copies this assay can detect is 138 copies/mL. A negative result does not preclude SARS-Cov-2 infection and should not be used as the sole basis for treatment or other patient management decisions. A negative result may occur with  improper specimen collection/handling, submission of specimen other than nasopharyngeal swab, presence of viral mutation(s) within the areas targeted by this assay, and inadequate number of viral copies(<138 copies/mL). A negative result must be combined with clinical observations, patient history, and epidemiological information. The expected result is Negative.  Fact Sheet for Patients:  EntrepreneurPulse.com.au  Fact Sheet for Healthcare Providers:  IncredibleEmployment.be  This test is no t yet approved or cleared by the [place] FDA and  has been authorized for detection and/or diagnosis of SARS-CoV-2 by FDA under an Emergency Use Authorization (EUA). This EUA will remain  in effect (meaning this test can be used) for the duration of the COVID-19 declaration under Section 564(b)(1) of the Act, 21 U.S.C.section 360bbb-3(b)(1), unless the authorization is terminated  or revoked sooner.       Influenza A by PCR NEGATIVE NEGATIVE Final   Influenza B by PCR NEGATIVE NEGATIVE Final    Comment: (NOTE) The Xpert Xpress SARS-CoV-2/FLU/RSV plus assay is intended as an aid in the diagnosis of influenza from Nasopharyngeal swab specimens and should not be used as a sole basis for treatment. Nasal washings  and aspirates are unacceptable for Xpert Xpress SARS-CoV-2/FLU/RSV testing.  Fact Sheet for Patients: EntrepreneurPulse.com.au  Fact Sheet for Healthcare Providers: IncredibleEmployment.be  This test is not yet approved or cleared by the [place] FDA and has been authorized for detection and/or diagnosis of SARS-CoV-2 by FDA under an Emergency Use Authorization (EUA). This EUA will remain in effect (meaning this test can be used) for the duration of the COVID-19 declaration under Section 564(b)(1) of the Act, 21 U.S.C. section 360bbb-3(b)(1), unless the authorization is terminated or revoked.  Performed at [facility], [address] [place], [place] [address]   Aerobic/Anaerobic Culture w Gram Stain (surgical/deep wound)  Status: None (Preliminary result)   Collection Time: 08/26/21  9:27 AM   Specimen: PATH Other; Tissue  Result Value Ref Range Status   Specimen Description   Final    TISSUE Performed at [facility], [address], [place], [place] [address]    Special Requests   Final    PERITONEAL Performed at [facility], [address], [place], [place] [address]    Gram Stain   Final    RARE WBC PRESENT, PREDOMINANTLY PMN NO ORGANISMS SEEN Performed at [facility] [address], [place], [place] [address]    Culture   Final    RARE STAPHYLOCOCCUS AUREUS RARE STREPTOCOCCUS MITIS/ORALIS RARE ESCHERICHIA COLI NO ANAEROBES ISOLATED; CULTURE IN PROGRESS FOR 5 DAYS    Report Status PENDING  Incomplete   Organism ID, Bacteria STAPHYLOCOCCUS AUREUS  Final   Organism ID, Bacteria STREPTOCOCCUS MITIS/ORALIS  Final   Organism ID, Bacteria ESCHERICHIA COLI  Final      Susceptibility   Escherichia coli - MIC*    AMPICILLIN 4 SENSITIVE Sensitive     CEFAZOLIN <=4 SENSITIVE Sensitive     CEFEPIME <=0.12 SENSITIVE Sensitive     CEFTAZIDIME <=1 SENSITIVE Sensitive     CEFTRIAXONE <=0.25 SENSITIVE Sensitive      CIPROFLOXACIN <=0.25 SENSITIVE Sensitive     GENTAMICIN 4 SENSITIVE Sensitive     IMIPENEM 1 SENSITIVE Sensitive     TRIMETH/SULFA <=20 SENSITIVE Sensitive     AMPICILLIN/SULBACTAM <=2 SENSITIVE Sensitive     PIP/TAZO <=4 SENSITIVE Sensitive     * RARE ESCHERICHIA COLI   Staphylococcus aureus - MIC*    CIPROFLOXACIN <=0.5 SENSITIVE Sensitive     ERYTHROMYCIN RESISTANT Resistant     GENTAMICIN <=0.5 SENSITIVE Sensitive     OXACILLIN <=0.25 SENSITIVE Sensitive     TETRACYCLINE <=1 SENSITIVE Sensitive     VANCOMYCIN 1 SENSITIVE Sensitive     TRIMETH/SULFA <=10 SENSITIVE Sensitive     CLINDAMYCIN RESISTANT Resistant     RIFAMPIN <=0.5 SENSITIVE Sensitive     Inducible Clindamycin POSITIVE Resistant     * RARE STAPHYLOCOCCUS AUREUS   Streptococcus mitis/oralis - MIC*    TETRACYCLINE >=16 RESISTANT Resistant     VANCOMYCIN 0.25 SENSITIVE Sensitive     CLINDAMYCIN >=1 RESISTANT Resistant     PENICILLIN Value in next row Intermediate      INTERMEDIATE0.25    CEFTRIAXONE Value in next row Sensitive      SENSITIVE0.5    * RARE STREPTOCOCCUS MITIS/ORALIS  Culture, blood (routine x 2)     Status: None (Preliminary result)   Collection Time: 08/26/21 12:37 PM   Specimen: BLOOD RIGHT HAND  Result Value Ref Range Status   Specimen Description   Final    BLOOD RIGHT HAND BOTTLES DRAWN AEROBIC AND ANAEROBIC Performed at [facility], [address], [place], [place] [address]    Special Requests   Final    Blood Culture results may not be optimal due to an excessive volume of blood received in culture bottles Performed at [facility], [address] [place], [place] [address]    Culture  Setup Time   Final    CORRECTED RESULTS NO ORGANISMS SEEN PREVIOUSLY REPORTED AS: GRAM POSITIVE COCCI ANAEROBIC BOTTLE ONLY CORRECTED RESULTS CALLED TOPark Breed [id number] FCP Performed at [facility], [address] [place],  [address]    Culture   Final    NO GROWTH 4  DAYS Performed at [facility], [address], [place], [place] [address]    Report Status PENDING  Incomplete  Blood Culture ID Panel (Reflexed)     Status: None   Collection Time: 08/26/21 12:37 PM  Result Value Ref Range Status   Enterococcus faecalis NOT DETECTED NOT DETECTED Final   Enterococcus Faecium NOT DETECTED NOT DETECTED Final   Listeria monocytogenes NOT DETECTED NOT DETECTED Final   Staphylococcus species NOT DETECTED NOT DETECTED Final   Staphylococcus aureus (BCID) NOT DETECTED NOT DETECTED Final   Staphylococcus epidermidis NOT DETECTED NOT DETECTED Final   Staphylococcus lugdunensis NOT DETECTED NOT DETECTED Final   Streptococcus species NOT DETECTED NOT DETECTED Final   Streptococcus agalactiae NOT DETECTED NOT DETECTED Final   Streptococcus pneumoniae NOT DETECTED NOT DETECTED Final   Streptococcus pyogenes NOT DETECTED NOT DETECTED Final   A.calcoaceticus-baumannii NOT DETECTED NOT DETECTED Final   Bacteroides fragilis NOT DETECTED NOT DETECTED Final   Enterobacterales NOT DETECTED NOT DETECTED Final   Enterobacter cloacae complex NOT DETECTED NOT DETECTED Final   Escherichia coli NOT DETECTED NOT DETECTED Final   Klebsiella aerogenes NOT DETECTED NOT DETECTED Final   Klebsiella oxytoca NOT DETECTED NOT DETECTED Final   Klebsiella pneumoniae NOT DETECTED NOT DETECTED Final   Proteus species NOT DETECTED NOT DETECTED Final   Salmonella species NOT DETECTED NOT DETECTED Final   Serratia marcescens NOT DETECTED NOT DETECTED Final   Haemophilus influenzae NOT DETECTED NOT DETECTED Final   Neisseria meningitidis NOT DETECTED NOT DETECTED Final   Pseudomonas aeruginosa NOT DETECTED NOT DETECTED Final   Stenotrophomonas maltophilia NOT DETECTED NOT DETECTED Final   Candida albicans NOT DETECTED NOT DETECTED Final   Candida auris NOT DETECTED NOT DETECTED Final   Candida glabrata NOT DETECTED NOT DETECTED Final   Candida krusei NOT DETECTED NOT DETECTED Final    Candida parapsilosis NOT DETECTED NOT DETECTED Final   Candida tropicalis NOT DETECTED NOT DETECTED Final   Cryptococcus neoformans/gattii NOT DETECTED NOT DETECTED Final    Comment: Performed at [facility], [address], [place], [place] [address]  Culture, blood (routine x 2)     Status: None (Preliminary result)   Collection Time: 08/26/21 12:49 PM   Specimen: BLOOD LEFT HAND  Result Value Ref Range Status   Specimen Description   Final    BLOOD LEFT HAND BOTTLES DRAWN AEROBIC AND ANAEROBIC   Special Requests   Final    Blood Culture results may not be optimal due to an excessive volume of blood received in culture bottles   Culture   Final    NO GROWTH 4 DAYS Performed at [facility], [address], [place], [place] [address]    Report Status PENDING  Incomplete         Radiology Studies: No results found.      Scheduled Meds:  acetaminophen  1,000 mg Oral Q6H   pantoprazole (PROTONIX) IV  40 mg Intravenous Q12H   Continuous Infusions:  cefTRIAXone (ROCEPHIN)  IV 200 mL/hr at 08/29/21 1755   fluconazole (DIFLUCAN) IV 200 mg (08/30/21 0915)   lactated ringers 35 mL/hr at 08/29/21 1755   metronidazole 500 mg (08/30/21 1319)     LOS: 4 days    Time spent: 35 minutes    Tameron Lama Darleen Crocker, DO [facility]  If 7PM-7AM, please contact night-coverage [email or web address] 08/30/2021, 4:00 PM

## 2021-08-30 NOTE — Assessment & Plan Note (Signed)
Repleted, monitor

## 2021-08-30 NOTE — Assessment & Plan Note (Addendum)
Surgery team consulted IV pain management IV fluids reduced to [facility].  POD #4 s/p Exp lap on 2/18 Upper GI study 2/20 reassuring and NG removed and clears started Surgery advancing to soft diet 2/22 Discussed with pharm D and ceftriaxone dose should be 2 gm given history of bowel perforation.  Anticipate discharge in a.m. once diet tolerated and PT evaluation completed

## 2021-08-30 NOTE — Hospital Course (Addendum)
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  There was no SOB. No nausea, vomiting or diarrhea.  This was a severe frightening pain that he has never experienced and it continues.  Any movement exacerbates the pain.  He has not been able to eat or drink. He has been evaluated in the ED his EKG did not show acute changes.  He had a mild leukocytosis and mild lipase elevation.  CT scan shows perforated viscus with free air present and an incarcerated umbilical hernia with inflammatory changes around the gastroduodenal junction.  Pt was started on IV protonix and IV antibiotics and IV fluids.  Surgery consulted and requested medical admission and plan is for patient to go for exploratory lap later today.     08/27/2021:  POD #1 - Pt having some pain, but controlled with medication.  No BS. NG remains in place.  Surgery planning for upper GI study on 2/20 to evaluate repair.    08/28/2021: POD #2 - Pt continues to feel better.  Abd pain improving. No BM. Bowel sounds heard.  Upper GI study ordered today by surgery team.  NG remain in place.     08/29/2021: POD #3 - NGT out since 2/20. Tolerating clears.  No bowel movement yet. Pain improving.  WBC coming down.    08/30/2021: POD #4-diet advanced to soft today and weaning off oxygen.  PT to evaluate patient to determine safety on discharge.  Anticipate discharge in a.m.

## 2021-08-30 NOTE — Progress Notes (Signed)
Patient and wife report medium soft, formed bm with no blood earlier in evening around shift change.  Dressing to abd dry and intact.  No c/o nausea.  Received dilaudid 0.5 mg for pain rated a 5.  Wife at bedside and to spend night.

## 2021-08-30 NOTE — Assessment & Plan Note (Signed)
Creatinine at 1.02 and stable

## 2021-08-30 NOTE — Assessment & Plan Note (Signed)
POD #4 s/p repair on 2/18 by surgical team

## 2021-08-30 NOTE — Progress Notes (Signed)
Patient tolerated amoxicillin oral challenge on 08/30/21

## 2021-08-30 NOTE — Assessment & Plan Note (Signed)
Pt is postop day #4  s/p gastroduodenal repair.  Upper GI study on 2/20 reassuring and diet started.  Advancing to soft diet 2/22

## 2021-08-30 NOTE — Evaluation (Signed)
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  There was no SOB. No nausea, vomiting or diarrhea.  This was a severe frightening pain that he has never experienced and it continues.  Any movement exacerbates the pain.  He has not been able to eat or drink. He has been evaluated in the ED his EKG did not show acute changes.  He had a mild leukocytosis and mild lipase elevation.  CT scan shows perforated viscus with free air present and an incarcerated umbilical hernia with inflammatory changes around the gastroduodenal junction.  Pt was started on IV protonix and IV antibiotics and IV fluids.  Surgery consulted and requested medical admission and plan is for patient to go for exploratory lap later today.   Clinical Impression  Patient presents supine in bed with wife and family member in room. Patient noting mild abdominal pain but controlled with pain medications. Patient is independent with bed mobility and able to stand from bedside independently. Patient shows good standing balance and is able to march in place with no UE support. Patient able to walk 30 feet in room with no AD and no noted fatigue, no apparent distress. Oxygen sat around 93% on room air. Patient able to maintain semi tandem stance at bedside with no LOB. Patient returned to bed with head in upright position. Patient left in bed with phone and call bell in reach, wife and family member present in room. Patient to be discharged from hospital today and will be discharged  from physical therapy to care of nursing for ambulation daily as tolerated for length of stay.          Recommendations for follow up therapy are one component of a multi-disciplinary discharge planning process, led by the attending physician.  Recommendations may be updated based on patient status, additional functional criteria and insurance authorization.  Follow Up Recommendations Outpatient PT    Assistance Recommended at Discharge    Patient can return home with the following  A little help with walking and/or transfers    Equipment Recommendations None recommended by PT  Recommendations for Other Services       Functional Status Assessment Patient has had a recent decline in their functional status and demonstrates the ability to make significant improvements in function in a reasonable and predictable amount of time.     Precautions / Restrictions Precautions Precautions: Fall Restrictions Weight Bearing Restrictions: No      Mobility  Bed Mobility Overal bed mobility: Independent                  Transfers Overall transfer level: Independent                      Ambulation/Gait Ambulation/Gait assistance: Supervision Gait Distance (Feet): 30 Feet Assistive device: None         General Gait Details: small step gait  Stairs            Wheelchair Mobility    Modified Rankin (Stroke Patients Only)  Balance Overall balance assessment: Modified Independent                                           Pertinent Vitals/Pain Pain Assessment Pain Assessment: 0-10 Pain Score: 3  Pain Location: abdomen Pain Descriptors / Indicators: Sore Pain Intervention(s): Monitored during session, Limited activity within patient's tolerance    Home Living Family/patient expects to be discharged to:: Private residence Living Arrangements: Spouse/significant other Available Help at Discharge: Family Type of Home:  House Home Access: Stairs to enter Entrance Stairs-Rails: Can reach both Entrance Stairs-Number of Steps: 5   Home Layout: Multi-level Home Equipment: Conservation officer, nature (2 wheels)      Prior Function Prior Level of Function : Independent/Modified Independent                     Hand Dominance        Extremity/Trunk Assessment   Upper Extremity Assessment Upper Extremity Assessment: Overall WFL for tasks assessed    Lower Extremity Assessment Lower Extremity Assessment: Overall WFL for tasks assessed       Communication   Communication: No difficulties  Cognition Arousal/Alertness: Awake/alert Behavior During Therapy: WFL for tasks assessed/performed Overall Cognitive Status: Within Functional Limits for tasks assessed                                          General Comments General comments (skin integrity, edema, etc.): No AD, Increased time, small step gait    Exercises     Assessment/Plan    PT Assessment Patient does not need any further PT services  PT Problem List         PT Treatment Interventions      PT Goals (Current goals can be found in the Care Plan section)  Acute Rehab PT Goals Patient Stated Goal: Return home PT Goal Formulation: With patient/family Time For Goal Achievement: 09/06/21 Potential to Achieve Goals: Good    Frequency       Co-evaluation               AM-PAC PT "6 Clicks" Mobility  Outcome Measure Help needed turning from your back to your side while in a flat bed without using bedrails?: None Help needed moving from lying on your back to sitting on the side of a flat bed without using bedrails?: None Help needed moving to and from a bed to a chair (including a wheelchair)?: None Help needed standing up from a chair using your arms (e.g., wheelchair or bedside chair)?: None Help needed to walk in hospital room?: A Little Help needed climbing 3-5 steps with a railing? : A Little 6 Click  Score: 22    End of Session   Activity Tolerance: Patient tolerated treatment well Patient left: in bed;with call bell/phone within reach;with family/visitor present Nurse Communication: Mobility status PT Visit Diagnosis: Muscle weakness (generalized) (M62.81)    Time: 3419-6222 PT Time Calculation (min) (ACUTE ONLY): 23 min   Charges:   PT Evaluation $PT Eval Low Complexity: 1 Low        4:49 PM, 08/30/21 Josue Hector PT DPT  Physical Therapist with [facility]  [phone]

## 2021-08-31 DIAGNOSIS — K631 Perforation of intestine (nontraumatic): Secondary | ICD-10-CM | POA: Diagnosis not present

## 2021-08-31 LAB — CULTURE, BLOOD (ROUTINE X 2)
Culture  Setup Time: NONE SEEN
Culture: NO GROWTH
Culture: NO GROWTH

## 2021-08-31 LAB — CBC WITH DIFFERENTIAL/PLATELET
Abs Immature Granulocytes: 0.11 10*3/uL — ABNORMAL HIGH (ref 0.00–0.07)
Basophils Absolute: 0 10*3/uL (ref 0.0–0.1)
Basophils Relative: 0 %
Eosinophils Absolute: 0 10*3/uL (ref 0.0–0.5)
Eosinophils Relative: 0 %
HCT: 38.8 % — ABNORMAL LOW (ref 39.0–52.0)
Hemoglobin: 12.3 g/dL — ABNORMAL LOW (ref 13.0–17.0)
Immature Granulocytes: 1 %
Lymphocytes Relative: 9 %
Lymphs Abs: 1 10*3/uL (ref 0.7–4.0)
MCH: 29.7 pg (ref 26.0–34.0)
MCHC: 31.7 g/dL (ref 30.0–36.0)
MCV: 93.7 fL (ref 80.0–100.0)
Monocytes Absolute: 1.1 10*3/uL — ABNORMAL HIGH (ref 0.1–1.0)
Monocytes Relative: 11 %
Neutro Abs: 8.6 10*3/uL — ABNORMAL HIGH (ref 1.7–7.7)
Neutrophils Relative %: 79 %
Platelets: 331 10*3/uL (ref 150–400)
RBC: 4.14 MIL/uL — ABNORMAL LOW (ref 4.22–5.81)
RDW: 13 % (ref 11.5–15.5)
WBC: 10.9 10*3/uL — ABNORMAL HIGH (ref 4.0–10.5)
nRBC: 0 % (ref 0.0–0.2)

## 2021-08-31 LAB — AEROBIC/ANAEROBIC CULTURE W GRAM STAIN (SURGICAL/DEEP WOUND)

## 2021-08-31 MED ORDER — NICOTINE 21 MG/24HR TD PT24: 21.0000 mg | MEDICATED_PATCH | Freq: Every day | TRANSDERMAL | 0 refills | Status: DC | PRN

## 2021-08-31 MED ORDER — FLUTICASONE PROPIONATE 50 MCG/ACT NA SUSP: NASAL | 2 refills | Status: DC

## 2021-08-31 MED ORDER — ACETAMINOPHEN 500 MG PO TABS: 1000.0000 mg | ORAL_TABLET | Freq: Four times a day (QID) | ORAL | 0 refills | Status: AC

## 2021-08-31 MED ORDER — AMOXICILLIN 500 MG PO TABS: 1000.0000 mg | ORAL_TABLET | Freq: Two times a day (BID) | ORAL | 0 refills | Status: AC

## 2021-08-31 MED ORDER — CLARITHROMYCIN 500 MG PO TABS: 500.0000 mg | ORAL_TABLET | Freq: Two times a day (BID) | ORAL | 0 refills | Status: DC

## 2021-08-31 MED ORDER — PANTOPRAZOLE SODIUM 40 MG PO TBEC: 40.0000 mg | DELAYED_RELEASE_TABLET | Freq: Every day | ORAL | 0 refills | Status: DC

## 2021-08-31 MED ORDER — OXYCODONE HCL 5 MG PO TABS: 5.0000 mg | ORAL_TABLET | ORAL | 0 refills | Status: DC | PRN

## 2021-08-31 NOTE — Discharge Summary (Signed)
Physician Discharge Summary   Patient: Michael Brady MRN: [id number] DOB: 01-20-1956  Admit date:     08/26/2021  Discharge date: 08/31/21  Discharge Physician: Rodena Goldmann   PCP: Coral Spikes, DO   Recommendations at discharge:   Follow-up with general surgery as recommended on 3/7 as well as with PCP in 1 week Continue on 14-day course of PPI, clarithromycin, and amoxicillin as prescribed for H. pylori treatment Continue scheduled Tylenol and as needed Roxicodone as prescribed and wean to off Continue other medications as noted below  Discharge Diagnoses: Principal Problem:   Perforated bowel ([place]) Active Problems:   Hyperlipidemia   Smoker   CKD (chronic kidney disease) stage 3, GFR 30-59 ml/min (HCC)   History of renal pelvis cancer   Leukocytosis   Sudden onset of severe abdominal pain   Thrombocytosis   History of depression   Pneumoperitoneum   Severe Diverticulosis   H/O left nephrectomy   Incarcerated incisional hernia   Hypomagnesemia  Resolved Problems:   * No resolved hospital problems. *   Hospital Course: 66 y/o male smoker with history of left renal pelvis malignancy and bladder tumor treated with resection in 2019 and 2020, depression, stage 3b CKD, ongoing tobacco use, remote history of hyperlipidemia (not on meds), allergic rhinitis who presented to ED early this morning after awakening with severe epigastric pain and chest pain without radiation.  There was no SOB. No nausea, vomiting or diarrhea.  This was a severe frightening pain that he has never experienced and it continues.  Any movement exacerbates the pain.  He has not been able to eat or drink. He has been evaluated in the ED his EKG did not show acute changes.  He had a mild leukocytosis and mild lipase elevation.  CT scan shows perforated viscus with free air present and an incarcerated umbilical hernia with inflammatory changes around the gastroduodenal junction.  Pt was started on IV protonix  and IV antibiotics and IV fluids.  Surgery consulted and requested medical admission and plan is for patient to go for exploratory lap later today.    08/27/2021:  POD #1 - Pt having some pain, but controlled with medication.  No BS. NG remains in place.  Surgery planning for upper GI study on 2/20 to evaluate repair.   08/28/2021: POD #2 - Pt continues to feel better.  Abd pain improving. No BM. Bowel sounds heard.  Upper GI study ordered today by surgery team.  NG remain in place.    08/29/2021: POD #3 - NGT out since 2/20. Tolerating clears.  No bowel movement yet. Pain improving.  WBC coming down.    08/30/2021: POD #4-diet advanced to soft today and weaning off oxygen.  PT to evaluate patient to determine safety on discharge.  Anticipate discharge in a.m.   09/01/2031: POD #5-tolerating dietary advancement and has weaned off oxygen.  PT recommends outpatient physical therapy as needed, otherwise doing well.  He will be discharged on treatment for H. pylori as noted as well as some pain medications as needed and close follow-up with general surgery on 3/7.  Assessment and Plan: * Perforated bowel ([place])- (present on admission) Surgery team consulted IV pain management IV fluids reduced to [facility].  POD #5 s/p Exp lap on 2/18 Upper GI study 2/20 reassuring and NG removed and clears started Tolerating soft diet. Okay for discharge with outpatient general surgery follow-up on 3/7.   Hypomagnesemia Resolved  Incarcerated incisional hernia- (present on admission) POD #  5 s/p repair on 2/18 by surgical team  H/O left nephrectomy Stable   Severe Diverticulosis- (present on admission) Noted on CT scan.  He may benefit from outpatient GI follow up.   Pneumoperitoneum- (present on admission) Pt is postop day #5  s/p gastroduodenal repair.  Upper GI study on 2/20 reassuring and diet started.  Tolerating soft diet.  History of depression Stable on home meds.   Thrombocytosis- (present on  admission) Reactive but resolved now.  Sudden onset of severe abdominal pain- (present on admission) Pain is well managed on oral medications Continue Tylenol as scheduled with oxycodone as needed  Leukocytosis- (present on admission) WBC trending down to 10.9.  No fever, cultures no growth.  Recheck in outpatient setting.  History of renal pelvis cancer S/p left nephroureterectomy in 2020 by Dr. Tresa Moore  CKD (chronic kidney disease) stage 3, GFR 30-59 ml/min ([place])- (present on admission) Creatinine trended down to approximately 1.     Smoker- (present on admission) Nicotine patch ordered Counseled on urgent need for tobacco cessation at bedside, pt verbalized understanding  Hyperlipidemia- (present on admission) Reportedly diet controlled           Consultants: General surgery Procedures performed: 08/26/21:  Exploratory laparotomy with gastrorrhaphy and Phillip Heal patch repair, repair of incarcerated incisional hernia  Disposition: Home Diet recommendation:  Discharge Diet Orders (From admission, onward)     Start     Ordered   08/31/21 0000  Diet - low sodium heart healthy        08/31/21 0925           Cardiac diet  DISCHARGE MEDICATION: Allergies as of 08/31/2021   No Known Allergies      Medication List     TAKE these medications    acetaminophen 500 MG tablet Commonly known as: TYLENOL Take 2 tablets (1,000 mg total) by mouth every 6 (six) hours.   amoxicillin 500 MG tablet Commonly known as: AMOXIL Take 2 tablets (1,000 mg total) by mouth 2 (two) times daily for 14 days.   citalopram 20 MG tablet Commonly known as: CELEXA TAKE 1 TABLET BY MOUTH DAILY What changed: additional instructions   clarithromycin 500 MG tablet Commonly known as: BIAXIN Take 1 tablet (500 mg total) by mouth 2 (two) times daily for 14 days.   fluticasone 50 MCG/ACT nasal spray Commonly known as: FLONASE SHAKE LIQUID AND USE 2 SPRAYS IN EACH NOSTRIL DAILY    multivitamin with minerals tablet Take 1 tablet by mouth daily.   nicotine 21 mg/24hr patch Commonly known as: NICODERM CQ - dosed in mg/24 hours Place 1 patch (21 mg total) onto the skin daily as needed (nicotine craving).   oxyCODONE 5 MG immediate release tablet Commonly known as: Oxy IR/ROXICODONE Take 1 tablet (5 mg total) by mouth every 3 (three) hours as needed for moderate pain.   pantoprazole 40 MG tablet Commonly known as: Protonix Take 1 tablet (40 mg total) by mouth daily for 14 days.               Discharge Care Instructions  (From admission, onward)           Start     Ordered   08/31/21 0000  If the dressing is still on your incision site when you go home, remove it on the third day after your surgery date. Remove dressing if it begins to fall off, or if it is dirty or damaged before the third day.  08/31/21 0925            Follow-up Information     Pappayliou, Flint Melter, DO. Call on 09/12/2021.   Specialty: General Surgery Why: Call to schedule follow up with me on March 7th Contact information: [address] Linna Hoff [facility] [address] [phone]         Coral Spikes, DO. Schedule an appointment as soon as possible for a visit in 1 week(s).   Specialty: Family Medicine Contact information: [address] [place] [address] [phone]                 Discharge Exam: Danley Danker Weights   08/26/21 1125 08/27/21 0500  Weight: 73.4 kg 73.3 kg     Condition at discharge: stable  The results of significant diagnostics from this hospitalization (including imaging, microbiology, ancillary and laboratory) are listed below for reference.   Imaging Studies: CT ABDOMEN PELVIS W CONTRAST  Result Date: 08/26/2021 CLINICAL DATA:  66 year old male with acute 10/10 epigastric abdominal pain. Pancreatitis suspected. EXAM: CT ABDOMEN AND PELVIS WITH CONTRAST TECHNIQUE: Multidetector CT imaging of the abdomen and pelvis was  performed using the standard protocol following bolus administration of intravenous contrast. RADIATION DOSE REDUCTION: This exam was performed according to the departmental dose-optimization program which includes automated exposure control, adjustment of the mA and/or kV according to patient size and/or use of iterative reconstruction technique. CONTRAST:  32mL OMNIPAQUE IOHEXOL 300 MG/ML  SOLN COMPARISON:  CT Abdomen and Pelvis [facility] 06/12/2019 and earlier. FINDINGS: Lower chest: Negative. No pericardial or pleural effusion. Hepatobiliary: Small volume pneumoperitoneum and free fluid around the liver. Mildly complex fluid density. Liver enhancement maintained. Gallbladder within normal limits. No bile duct enlargement. Pancreas: Within normal limits. Spleen: Small volume perisplenic fluid. Otherwise negative. Adrenals/Urinary Tract: Chronically absent left kidney. Stable postoperative appearance of the left renal fossa since 2020. Normal adrenal glands. Nonobstructed solitary right kidney. Normal right renal contrast excretion. Diminutive bladder. Stomach/Bowel: Severe diverticulosis of the descending and sigmoid colon, bowel walls of those segments appear highly indistinct. Small volume pneumoperitoneum under the diaphragm and situated adjacent to the liver. No retroperitoneal gas is identified. Complex fluid layering in both pericolic gutters. Small volume free fluid elsewhere in the abdomen. Superimposed chronic but enlarged and inflamed appearing umbilical hernia containing transverse mesocolon (series 2, image 40). No free air within the hernia, but underlying transverse colon diverticula and irregular bowel wall appearance there also. Additionally, there is a large gas containing 6.4 cm diverticulum versus contained perforation immediately subjacent to the herniated mesentery (coronal image 64) which is in proximity to both the transverse colon and inflamed gastroduodenal junction.  Axial images however suggest this communicates with the transverse colon (series 2 images 35 through 39). Right: Mostly decompressed and fluid containing. No superimposed dilated small bowel. Small gastric hiatal hernia, and small volume of pneumoperitoneum in that hernia sac. Inflamed appearance of both the gastric antrum and the duodenal C loop as seen on coronal image 38. Duodenum is decompressed. The distal duodenum and ligament of Treitz have a more normal appearance. No other inflamed small bowel loops identified. Cecum is located in the midline, and midline appendix appears normal on coronal image 49. Vascular/Lymphatic: Widespread Aortoiliac calcified atherosclerosis. Tortuous infrarenal abdominal aorta, but no infrarenal aneurysm considering less than 1.5 times the normal caliber. Major arterial structures remain patent. No lymphadenopathy identified. Portal venous system patent. Reproductive: Negative. Other: Complex free fluid in the pelvis, moderate to large volume. Musculoskeletal: Chronic grade  1 spondylolisthesis at L4-L5. No acute osseous abnormality identified. IMPRESSION: 1. Positive for Acutely Perforated Bowel. In the epigastrium both a transverse mesocolon containing umbilical hernia and the gastroduodenal junction appear acutely inflamed. Just deep to the ventral abdominal hernia a 6.4 cm pocket of gas could be a giant diverticulum, or perforation site, and is favored to arise from the Transverse Colon. But the possibility of a perforated peptic ulcer disease remains. 2. Associated pneumoperitoneum and mildly complex free fluid in the abdomen and pelvis. 3. Superimposed severe diverticulosis of both the descending and sigmoid colon - both of which have indistinct bowel walls, but an epigastric source of perforation is favored as in #1. 4. No bowel obstruction.  No evidence of pancreatitis. 5. Chronically left nephrectomy. Advanced Aortic Atherosclerosis (ICD10-I70.0), tortuous aorta. Study  discussed by telephone with Dr. Delora Fuel on 10/15/8117 at 05:56 . Electronically Signed   By: Genevie Ann M.D.   On: 08/26/2021 06:05   DG Chest 1V REPEAT Same Day  Result Date: 08/26/2021 CLINICAL DATA:  NG tube placement EXAM: CHEST - 1 VIEW SAME DAY COMPARISON:  Previous studies including the examination done earlier today FINDINGS: There is interval repositioning of enteric tube with its tip in the stomach. Skin staples are seen in the abdomen. Cardiac size is within normal limits. Increased density is seen in the medial left lower lung fields. In the CT done earlier today there was no definite focal infiltrate in the medial left lower lung fields. There is soft tissue fullness in the retrocardiac region which may be due to tortuous thoracic aorta or small fixed hiatal hernia. Costophrenic angles are clear. There is no pneumothorax. Degenerative changes are noted in right shoulder. There is large smooth marginated calcification adjacent to the inferior margin of glenoid on the right side possibly a loose body or old ununited fracture. IMPRESSION: There is interval repositioning of enteric tube with its tip in the stomach. There is increased opacity in the medial left lower lung fields suggesting atelectasis. Possible small hiatal hernia. Electronically Signed   By: Elmer Picker M.D.   On: 08/26/2021 15:42   DG CHEST PORT 1 VIEW  Result Date: 08/26/2021 CLINICAL DATA:  NG tube placement EXAM: PORTABLE CHEST 1 VIEW COMPARISON:  03/15/2020 FINDINGS: Esophagogastric tube is positioned folded and doubled back over the esophagus. The heart size and mediastinal contours are within normal limits. Both lungs are clear. The visualized skeletal structures are unremarkable. IMPRESSION: 1. Esophagogastric tube is positioned folded and doubled back over the esophagus. Recommend repositioning. 2. No acute abnormality of the lungs in AP portable projection. Electronically Signed   By: Delanna Ahmadi M.D.   On:  08/26/2021 13:48   DG UGI W SINGLE CM (SOL OR THIN BA)  Result Date: 08/28/2021 CLINICAL DATA:  Perforated ulcer anterior wall duodenal bulb post repair EXAM: WATER SOLUBLE UPPER GI SERIES TECHNIQUE: Single-column upper GI series was performed using water soluble contrast. CONTRAST:  131mL OMNIPAQUE IOHEXOL 300 MG/ML SOLN via nasogastric tube COMPARISON:  CT abdomen and pelvis 08/26/2021 FLUOROSCOPY: Fluoroscopy Time:  2 minutes 12 seconds Radiation Exposure Index (if provided by the fluoroscopic device): 136.5 mGy Number of Acquired Spot Images: 2 + multiple fluoroscopic screen captures FINDINGS: Normal bowel gas pattern. Nasogastric tube projects over stomach. Normal gastric distension without mass or ulceration. No gastric outlet obstruction. Wall thickening of duodenal bulb consistent with perforated ulcer and surgery. Duodenal sweep normal appearance. Normal position of ligament of Treitz. No extraluminal contrast is seen to suggest  leak. IMPRESSION: Wall thickening at duodenal bulb consistent with history of perforated ulcer and Graham patch. No evidence of duodenal leak post repair of perforated duodenal ulcer. Electronically Signed   By: Lavonia Dana M.D.   On: 08/28/2021 13:56    Microbiology: Results for orders placed or performed during the hospital encounter of 08/26/21  Resp Panel by RT-PCR (Flu A&B, Covid) Nasopharyngeal Swab     Status: None   Collection Time: 08/26/21  6:11 AM   Specimen: Nasopharyngeal Swab; Nasopharyngeal(NP) swabs in vial transport medium  Result Value Ref Range Status   SARS Coronavirus 2 by RT PCR NEGATIVE NEGATIVE Final    Comment: (NOTE) SARS-CoV-2 target nucleic acids are NOT DETECTED.  The SARS-CoV-2 RNA is generally detectable in upper respiratory specimens during the acute phase of infection. The lowest concentration of SARS-CoV-2 viral copies this assay can detect is 138 copies/mL. A negative result does not preclude SARS-Cov-2 infection and should not  be used as the sole basis for treatment or other patient management decisions. A negative result may occur with  improper specimen collection/handling, submission of specimen other than nasopharyngeal swab, presence of viral mutation(s) within the areas targeted by this assay, and inadequate number of viral copies(<138 copies/mL). A negative result must be combined with clinical observations, patient history, and epidemiological information. The expected result is Negative.  Fact Sheet for Patients:  EntrepreneurPulse.com.au  Fact Sheet for Healthcare Providers:  IncredibleEmployment.be  This test is no t yet approved or cleared by the [place] FDA and  has been authorized for detection and/or diagnosis of SARS-CoV-2 by FDA under an Emergency Use Authorization (EUA). This EUA will remain  in effect (meaning this test can be used) for the duration of the COVID-19 declaration under Section 564(b)(1) of the Act, 21 U.S.C.section 360bbb-3(b)(1), unless the authorization is terminated  or revoked sooner.       Influenza A by PCR NEGATIVE NEGATIVE Final   Influenza B by PCR NEGATIVE NEGATIVE Final    Comment: (NOTE) The Xpert Xpress SARS-CoV-2/FLU/RSV plus assay is intended as an aid in the diagnosis of influenza from Nasopharyngeal swab specimens and should not be used as a sole basis for treatment. Nasal washings and aspirates are unacceptable for Xpert Xpress SARS-CoV-2/FLU/RSV testing.  Fact Sheet for Patients: EntrepreneurPulse.com.au  Fact Sheet for Healthcare Providers: IncredibleEmployment.be  This test is not yet approved or cleared by the [place] FDA and has been authorized for detection and/or diagnosis of SARS-CoV-2 by FDA under an Emergency Use Authorization (EUA). This EUA will remain in effect (meaning this test can be used) for the duration of the COVID-19 declaration under Section  564(b)(1) of the Act, 21 U.S.C. section 360bbb-3(b)(1), unless the authorization is terminated or revoked.  Performed at [facility], [address], [place], [place] [address]   Aerobic/Anaerobic Culture w Gram Stain (surgical/deep wound)     Status: None (Preliminary result)   Collection Time: 08/26/21  9:27 AM   Specimen: PATH Other; Tissue  Result Value Ref Range Status   Specimen Description   Final    TISSUE Performed at [facility], [address] [place], [place] [address]    Special Requests   Final    PERITONEAL Performed at [facility], [address] [place], [place] [address]    Gram Stain   Final    RARE WBC PRESENT, PREDOMINANTLY PMN NO ORGANISMS SEEN Performed at [facility] [address] [place], [place] [address]    Culture  Final    RARE STAPHYLOCOCCUS AUREUS RARE STREPTOCOCCUS MITIS/ORALIS RARE ESCHERICHIA COLI NO ANAEROBES ISOLATED; CULTURE IN PROGRESS FOR 5 DAYS    Report Status PENDING  Incomplete   Organism ID, Bacteria STAPHYLOCOCCUS AUREUS  Final   Organism ID, Bacteria STREPTOCOCCUS MITIS/ORALIS  Final   Organism ID, Bacteria ESCHERICHIA COLI  Final      Susceptibility   Escherichia coli - MIC*    AMPICILLIN 4 SENSITIVE Sensitive     CEFAZOLIN <=4 SENSITIVE Sensitive     CEFEPIME <=0.12 SENSITIVE Sensitive     CEFTAZIDIME <=1 SENSITIVE Sensitive     CEFTRIAXONE <=0.25 SENSITIVE Sensitive     CIPROFLOXACIN <=0.25 SENSITIVE Sensitive     GENTAMICIN 4 SENSITIVE Sensitive     IMIPENEM 1 SENSITIVE Sensitive     TRIMETH/SULFA <=20 SENSITIVE Sensitive     AMPICILLIN/SULBACTAM <=2 SENSITIVE Sensitive     PIP/TAZO <=4 SENSITIVE Sensitive     * RARE ESCHERICHIA COLI   Staphylococcus aureus - MIC*    CIPROFLOXACIN <=0.5 SENSITIVE Sensitive     ERYTHROMYCIN RESISTANT Resistant     GENTAMICIN <=0.5 SENSITIVE Sensitive     OXACILLIN <=0.25 SENSITIVE Sensitive     TETRACYCLINE <=1 SENSITIVE Sensitive     VANCOMYCIN 1 SENSITIVE Sensitive      TRIMETH/SULFA <=10 SENSITIVE Sensitive     CLINDAMYCIN RESISTANT Resistant     RIFAMPIN <=0.5 SENSITIVE Sensitive     Inducible Clindamycin POSITIVE Resistant     * RARE STAPHYLOCOCCUS AUREUS   Streptococcus mitis/oralis - MIC*    TETRACYCLINE >=16 RESISTANT Resistant     VANCOMYCIN 0.25 SENSITIVE Sensitive     CLINDAMYCIN >=1 RESISTANT Resistant     PENICILLIN Value in next row Intermediate      INTERMEDIATE0.25    CEFTRIAXONE Value in next row Sensitive      SENSITIVE0.5    * RARE STREPTOCOCCUS MITIS/ORALIS  Culture, blood (routine x 2)     Status: None   Collection Time: 08/26/21 12:37 PM   Specimen: BLOOD RIGHT HAND  Result Value Ref Range Status   Specimen Description   Final    BLOOD RIGHT HAND BOTTLES DRAWN AEROBIC AND ANAEROBIC Performed at [facility], [address], [place], [place] [address]    Special Requests   Final    Blood Culture results may not be optimal due to an excessive volume of blood received in culture bottles Performed at [facility], [address], [place], [place] [address]    Culture  Setup Time   Final    CORRECTED RESULTS NO ORGANISMS SEEN PREVIOUSLY REPORTED AS: GRAM POSITIVE COCCI ANAEROBIC BOTTLE ONLY CORRECTED RESULTS CALLED TOPark Breed [id number] FCP Performed at [facility], [address], [place], [place] [address]    Culture   Final    NO GROWTH 5 DAYS Performed at [facility], [address], [place], [place] [address]    Report Status 08/31/2021 FINAL  Final  Blood Culture ID Panel (Reflexed)     Status: None   Collection Time: 08/26/21 12:37 PM  Result Value Ref Range Status   Enterococcus faecalis NOT DETECTED NOT DETECTED Final   Enterococcus Faecium NOT DETECTED NOT DETECTED Final   Listeria monocytogenes NOT DETECTED NOT DETECTED Final   Staphylococcus species NOT DETECTED NOT DETECTED Final   Staphylococcus aureus (BCID) NOT DETECTED NOT DETECTED Final   Staphylococcus epidermidis NOT DETECTED NOT  DETECTED Final   Staphylococcus lugdunensis NOT DETECTED NOT DETECTED Final   Streptococcus  species NOT DETECTED NOT DETECTED Final   Streptococcus agalactiae NOT DETECTED NOT DETECTED Final   Streptococcus pneumoniae NOT DETECTED NOT DETECTED Final   Streptococcus pyogenes NOT DETECTED NOT DETECTED Final   A.calcoaceticus-baumannii NOT DETECTED NOT DETECTED Final   Bacteroides fragilis NOT DETECTED NOT DETECTED Final   Enterobacterales NOT DETECTED NOT DETECTED Final   Enterobacter cloacae complex NOT DETECTED NOT DETECTED Final   Escherichia coli NOT DETECTED NOT DETECTED Final   Klebsiella aerogenes NOT DETECTED NOT DETECTED Final   Klebsiella oxytoca NOT DETECTED NOT DETECTED Final   Klebsiella pneumoniae NOT DETECTED NOT DETECTED Final   Proteus species NOT DETECTED NOT DETECTED Final   Salmonella species NOT DETECTED NOT DETECTED Final   Serratia marcescens NOT DETECTED NOT DETECTED Final   Haemophilus influenzae NOT DETECTED NOT DETECTED Final   Neisseria meningitidis NOT DETECTED NOT DETECTED Final   Pseudomonas aeruginosa NOT DETECTED NOT DETECTED Final   Stenotrophomonas maltophilia NOT DETECTED NOT DETECTED Final   Candida albicans NOT DETECTED NOT DETECTED Final   Candida auris NOT DETECTED NOT DETECTED Final   Candida glabrata NOT DETECTED NOT DETECTED Final   Candida krusei NOT DETECTED NOT DETECTED Final   Candida parapsilosis NOT DETECTED NOT DETECTED Final   Candida tropicalis NOT DETECTED NOT DETECTED Final   Cryptococcus neoformans/gattii NOT DETECTED NOT DETECTED Final    Comment: Performed at [facility], [place] [address], [place], [place] [address]  Culture, blood (routine x 2)     Status: None   Collection Time: 08/26/21 12:49 PM   Specimen: BLOOD LEFT HAND  Result Value Ref Range Status   Specimen Description   Final    BLOOD LEFT HAND BOTTLES DRAWN AEROBIC AND ANAEROBIC   Special Requests   Final    Blood Culture results may not be optimal due to an  excessive volume of blood received in culture bottles   Culture   Final    NO GROWTH 5 DAYS Performed at [facility], [address] [place], [place] [address]    Report Status 08/31/2021 FINAL  Final    Labs: CBC: Recent Labs  Lab 08/27/21 0508 08/28/21 0528 08/29/21 0502 08/30/21 0437 08/31/21 0537  WBC 13.1* 16.3* 13.8* 13.1* 10.9*  NEUTROABS 11.2* 14.2* 12.0* 10.9* 8.6*  HGB 12.6* 11.7* 11.1* 13.0 12.3*  HCT 38.3* 36.0* 34.6* 41.6 38.8*  MCV 96.0 96.0 96.9 95.2 93.7  PLT 250 252 246 361 528   Basic Metabolic Panel: Recent Labs  Lab 08/26/21 0300 08/27/21 0508 08/28/21 0528 08/29/21 0502 08/30/21 0437  NA 135 136 135 136 135  K 4.3 4.6 4.2 3.8 4.5  CL 99 102 100 100 97*  CO2 23 28 27 28 29   GLUCOSE 135* 113* 99 100* 106*  BUN 28* 24* 21 19 19   CREATININE 1.44* 1.30* 1.10 0.97 1.02  CALCIUM 9.3 8.4* 8.7* 8.5* 8.6*  MG  --  1.6* 2.1 1.8 2.1   Liver Function Tests: Recent Labs  Lab 08/26/21 0300 08/27/21 0508 08/28/21 0528 08/29/21 0502  AST 21 22 18 15   ALT 15 22 17 16   ALKPHOS 84 53 61 56  BILITOT 0.5 0.6 0.8 0.7  PROT 7.9 6.1* 5.9* 5.7*  ALBUMIN 4.2 2.9* 2.8* 2.7*   CBG: No results for input(s): GLUCAP in the last 168 hours.  Discharge time spent: greater than 30 minutes.  Signed: Rodena Goldmann, DO [facility] 08/31/2021

## 2021-08-31 NOTE — Progress Notes (Signed)
[facility] Progress Note  5 Days Post-Op  Subjective: Patient seen and examined.  He is resting comfortably in bed.  He tolerated his breakfast without nausea and vomiting.  He is moving his bowels and passing flatus.  He was able to be weaned off of nasal cannula overnight, and is saturating in the mid 90s on room air.  Objective: Vital signs in last 24 hours: Temp:  [97.4 F (36.3 C)-98.2 F (36.8 C)] 98.2 F (36.8 C) (02/23 0447) Pulse Rate:  [50-69] 62 (02/23 0447) Resp:  [17-19] 18 (02/23 0447) BP: (138-168)/(85-94) 168/92 (02/23 0447) SpO2:  [94 %-96 %] 94 % (02/23 0447) Last BM Date : 08/29/21  Intake/Output from previous day: 02/22 0701 - 02/23 0700 In: 440 [P.O.:240; IV Piggyback:200] Out: -  Intake/Output this shift: No intake/output data recorded.  General appearance: alert, cooperative, and no distress GI: Abdomen soft, nondistended, no percussion tenderness, mild incisional tenderness to palpation, no rigidity, guarding, rebound tenderness; incision C/D/I with skin staples in place  Lab Results:  Recent Labs    08/30/21 0437 08/31/21 0537  WBC 13.1* 10.9*  HGB 13.0 12.3*  HCT 41.6 38.8*  PLT 361 331   BMET Recent Labs    08/29/21 0502 08/30/21 0437  NA 136 135  K 3.8 4.5  CL 100 97*  CO2 28 29  GLUCOSE 100* 106*  BUN 19 19  CREATININE 0.97 1.02  CALCIUM 8.5* 8.6*   PT/INR No results for input(s): LABPROT, INR in the last 72 hours.  Studies/Results: No results found.  Anti-infectives: Anti-infectives (From admission, onward)    Start     Dose/Rate Route Frequency Ordered Stop   08/31/21 0000  clarithromycin (BIAXIN) 500 MG tablet        500 mg Oral 2 times daily 08/31/21 0925 09/14/21 2359   08/31/21 0000  amoxicillin (AMOXIL) 500 MG tablet        1,000 mg Oral 2 times daily 08/31/21 0925 09/14/21 2359   08/30/21 1000  amoxicillin (AMOXIL) capsule 500 mg        500 mg Oral  Once 08/29/21 1446 08/30/21 0915   08/29/21  1800  cefTRIAXone (ROCEPHIN) 2 g in sodium chloride 0.9 % 100 mL IVPB        2 g 200 mL/hr over 30 Minutes Intravenous Every 24 hours 08/29/21 1324     08/27/21 0900  fluconazole (DIFLUCAN) IVPB 200 mg        200 mg 100 mL/hr over 60 Minutes Intravenous Every 24 hours 08/26/21 0813     08/27/21 0600  cefTRIAXone (ROCEPHIN) 1 g in sodium chloride 0.9 % 100 mL IVPB  Status:  Discontinued        1 g 200 mL/hr over 30 Minutes Intravenous Every 24 hours 08/26/21 1122 08/29/21 1324   08/26/21 1400  metroNIDAZOLE (FLAGYL) IVPB 500 mg        500 mg 100 mL/hr over 60 Minutes Intravenous Every 8 hours 08/26/21 0736     08/26/21 0813  metroNIDAZOLE (FLAGYL) 500 MG/100ML IVPB       Note to Pharmacy: Lear Ng S: cabinet override      08/26/21 0813 08/26/21 2208   08/26/21 0730  fluconazole (DIFLUCAN) IVPB 800 mg  Status:  Discontinued        800 mg 200 mL/hr over 120 Minutes Intravenous Every 24 hours 08/26/21 0717 08/26/21 0813   08/26/21 0615  cefTRIAXone (ROCEPHIN) 2 g in sodium chloride 0.9 % 100 mL IVPB  2 g 200 mL/hr over 30 Minutes Intravenous  Once 08/26/21 0604 08/26/21 0709   08/26/21 0615  metroNIDAZOLE (FLAGYL) IVPB 500 mg        500 mg 100 mL/hr over 60 Minutes Intravenous  Once 08/26/21 0604 08/26/21 7846       Assessment/Plan:  Patient is a 66 year old male who is status post exploratory laparotomy, gastrorrhaphy with Phillip Heal patch repair, incisional hernia repair on 2/18 for gastroduodenal ulcer perforation and incarcerated incisional hernia.  -Tolerating GI soft diet -Leukocytosis continuing to improve, 10.9 this AM -Continue Protonix -We will plan to treat patient for H. pylori upon discharge with triple therapy -Upon discharge, he will need 14 days of pantoprazole 40 mg daily, clarithromycin 500 mg BID, and amoxicillin 1 g BID -Patient stable for discharge from general surgery standpoint -Follow-up scheduled with me on 3/7   LOS: 5 days    Stefanie Hodgens A  Kailyn Dubie 08/31/2021

## 2021-08-31 NOTE — Progress Notes (Signed)
Pt placed on room air approximately 0330 this am. No respiratory distress, O2 sats ranging from 92%-94%. However tachypnea noted at times, with resps ranging 20-27 fluctuating. Pt unaware and in no distress.

## 2021-09-01 ENCOUNTER — Telehealth: Payer: Self-pay

## 2021-09-01 ENCOUNTER — Encounter (HOSPITAL_COMMUNITY): Payer: Self-pay | Admitting: Surgery

## 2021-09-01 NOTE — Telephone Encounter (Signed)
Transition Care Management Follow-up Telephone Call Date of discharge and from where: D/C 08/31/2021 [facility] How have you been since you were released from the hospital? Spoke with pt's wife, Katharine Look. Katharine Look states pt has been vomiting up "bile" all day. Jeral Fruit to call Dr. Derryl Harbor ASAP. Katharine Look verbalized understanding.  Any questions or concerns? No  Items Reviewed: Did the pt receive and understand the discharge instructions provided? Yes  Medications obtained and verified? Yes  Other? No  Any new allergies since your discharge? No  Dietary orders reviewed? No Do you have support at home? Yes   Home Care and Equipment/Supplies: Were home health services ordered? no If so, what is the name of the agency? N/A  Has the agency set up a time to come to the patient's home? no Were any new equipment or medical supplies ordered?  No What is the name of the medical supply agency? N/A Were you able to get the supplies/equipment? not applicable Do you have any questions related to the use of the equipment or supplies? No  Functional Questionnaire: (I = Independent and D = Dependent) ADLs: I  Bathing/Dressing- I  Meal Prep- I  Eating- I  Maintaining continence- I  Transferring/Ambulation- I  Managing Meds- I  Follow up appointments reviewed:  PCP Hospital f/u appt confirmed? Yes  Scheduled to see Dr. Lacinda Axon on 09/06/21 @ 1. [facility] f/u appt confirmed? Yes  Scheduled to see Dr. Derryl Harbor on 09/12/21 @ 1:45. Are transportation arrangements needed? No  If their condition worsens, is the pt aware to call PCP or go to the Emergency Dept.? Yes Was the patient provided with contact information for the PCP's office or ED? Yes Was to pt encouraged to call back with questions or concerns? Yes

## 2021-09-01 NOTE — Telephone Encounter (Signed)
Called pt to do Transitions of Care call. Spoke with pt's wife, Katharine Look. Katharine Look states pt has been vomiting up "bile" all day. Jeral Fruit to call Dr. Derryl Harbor ASAP to advise of patient's condition. Katharine Look verbalized understanding of all.

## 2021-09-04 ENCOUNTER — Telehealth: Payer: Self-pay | Admitting: *Deleted

## 2021-09-04 MED ORDER — ONDANSETRON HCL 4 MG PO TABS: 4.0000 mg | ORAL_TABLET | Freq: Three times a day (TID) | ORAL | 0 refills | Status: DC | PRN

## 2021-09-04 NOTE — Telephone Encounter (Signed)
Prescription sent to pharmacy.   Call placed to patient and patient made aware per VM. 

## 2021-09-04 NOTE — Telephone Encounter (Signed)
Received VM from patient wife, Katharine Look.   Surgical Date: 02/18/20236 Procedure: Ex Lap, Gastrorrhaphy, Inguinal Hernia Repair  Concerns: Patient wife reports that patient has had increased nausea and vomiting. States that it appears to be only bile in emesis.   Call placed to patient. States that he is having some slight nausea this morning, but he feels better. States that he did have some vomiting over the weekend, but he feels it was where he had not eaten.   Advised to use small bland meals and to advance diet as tolerated.   Ok to send Zofran PRN?

## 2021-09-06 ENCOUNTER — Other Ambulatory Visit: Payer: Self-pay

## 2021-09-06 ENCOUNTER — Inpatient Hospital Stay (HOSPITAL_COMMUNITY)
Admission: EM | Admit: 2021-09-06 | Discharge: 2021-09-10 | DRG: 439 | Disposition: A | Discharge status: Home / Self Care | Payer: BC Managed Care – PPO | Attending: Internal Medicine | Admitting: Internal Medicine

## 2021-09-06 ENCOUNTER — Inpatient Hospital Stay (HOSPITAL_COMMUNITY): Payer: BC Managed Care – PPO

## 2021-09-06 ENCOUNTER — Ambulatory Visit: Payer: BC Managed Care – PPO | Admitting: Family Medicine

## 2021-09-06 ENCOUNTER — Emergency Department (HOSPITAL_COMMUNITY): Payer: BC Managed Care – PPO

## 2021-09-06 ENCOUNTER — Encounter (HOSPITAL_COMMUNITY): Payer: Self-pay | Admitting: Internal Medicine

## 2021-09-06 DIAGNOSIS — K56600 Partial intestinal obstruction, unspecified as to cause: Secondary | ICD-10-CM | POA: Diagnosis not present

## 2021-09-06 DIAGNOSIS — N1832 Chronic kidney disease, stage 3b: Secondary | ICD-10-CM | POA: Diagnosis present

## 2021-09-06 DIAGNOSIS — K5939 Other megacolon: Secondary | ICD-10-CM | POA: Diagnosis not present

## 2021-09-06 DIAGNOSIS — Z79899 Other long term (current) drug therapy: Secondary | ICD-10-CM

## 2021-09-06 DIAGNOSIS — Z9889 Other specified postprocedural states: Secondary | ICD-10-CM

## 2021-09-06 DIAGNOSIS — R112 Nausea with vomiting, unspecified: Secondary | ICD-10-CM

## 2021-09-06 DIAGNOSIS — K56609 Unspecified intestinal obstruction, unspecified as to partial versus complete obstruction: Secondary | ICD-10-CM

## 2021-09-06 DIAGNOSIS — Z20822 Contact with and (suspected) exposure to covid-19: Secondary | ICD-10-CM | POA: Diagnosis present

## 2021-09-06 DIAGNOSIS — T363X5A Adverse effect of macrolides, initial encounter: Secondary | ICD-10-CM | POA: Diagnosis present

## 2021-09-06 DIAGNOSIS — Z9079 Acquired absence of other genital organ(s): Secondary | ICD-10-CM

## 2021-09-06 DIAGNOSIS — M19011 Primary osteoarthritis, right shoulder: Secondary | ICD-10-CM | POA: Diagnosis not present

## 2021-09-06 DIAGNOSIS — E785 Hyperlipidemia, unspecified: Secondary | ICD-10-CM | POA: Diagnosis present

## 2021-09-06 DIAGNOSIS — R109 Unspecified abdominal pain: Secondary | ICD-10-CM

## 2021-09-06 DIAGNOSIS — F17210 Nicotine dependence, cigarettes, uncomplicated: Secondary | ICD-10-CM | POA: Diagnosis not present

## 2021-09-06 DIAGNOSIS — E860 Dehydration: Secondary | ICD-10-CM

## 2021-09-06 DIAGNOSIS — Z905 Acquired absence of kidney: Secondary | ICD-10-CM | POA: Diagnosis not present

## 2021-09-06 DIAGNOSIS — B9681 Helicobacter pylori [H. pylori] as the cause of diseases classified elsewhere: Secondary | ICD-10-CM | POA: Diagnosis present

## 2021-09-06 DIAGNOSIS — F17200 Nicotine dependence, unspecified, uncomplicated: Secondary | ICD-10-CM | POA: Diagnosis present

## 2021-09-06 DIAGNOSIS — E875 Hyperkalemia: Secondary | ICD-10-CM | POA: Diagnosis present

## 2021-09-06 DIAGNOSIS — K859 Acute pancreatitis without necrosis or infection, unspecified: Secondary | ICD-10-CM | POA: Diagnosis present

## 2021-09-06 DIAGNOSIS — J309 Allergic rhinitis, unspecified: Secondary | ICD-10-CM | POA: Diagnosis not present

## 2021-09-06 DIAGNOSIS — A048 Other specified bacterial intestinal infections: Secondary | ICD-10-CM | POA: Diagnosis not present

## 2021-09-06 DIAGNOSIS — M24011 Loose body in right shoulder: Secondary | ICD-10-CM | POA: Diagnosis not present

## 2021-09-06 DIAGNOSIS — Z8551 Personal history of malignant neoplasm of bladder: Secondary | ICD-10-CM | POA: Diagnosis not present

## 2021-09-06 DIAGNOSIS — Z4659 Encounter for fitting and adjustment of other gastrointestinal appliance and device: Secondary | ICD-10-CM

## 2021-09-06 DIAGNOSIS — Z716 Tobacco abuse counseling: Secondary | ICD-10-CM

## 2021-09-06 DIAGNOSIS — N183 Chronic kidney disease, stage 3 unspecified: Secondary | ICD-10-CM | POA: Diagnosis present

## 2021-09-06 DIAGNOSIS — Z8553 Personal history of malignant neoplasm of renal pelvis: Secondary | ICD-10-CM

## 2021-09-06 DIAGNOSIS — I700 Atherosclerosis of aorta: Secondary | ICD-10-CM | POA: Diagnosis not present

## 2021-09-06 DIAGNOSIS — Z4682 Encounter for fitting and adjustment of non-vascular catheter: Secondary | ICD-10-CM | POA: Diagnosis not present

## 2021-09-06 LAB — CBC WITH DIFFERENTIAL/PLATELET
Abs Immature Granulocytes: 0.06 10*3/uL (ref 0.00–0.07)
Basophils Absolute: 0.1 10*3/uL (ref 0.0–0.1)
Basophils Relative: 0 %
Eosinophils Absolute: 0.1 10*3/uL (ref 0.0–0.5)
Eosinophils Relative: 1 %
HCT: 43.7 % (ref 39.0–52.0)
Hemoglobin: 14.1 g/dL (ref 13.0–17.0)
Immature Granulocytes: 0 %
Lymphocytes Relative: 16 %
Lymphs Abs: 2.3 10*3/uL (ref 0.7–4.0)
MCH: 30.6 pg (ref 26.0–34.0)
MCHC: 32.3 g/dL (ref 30.0–36.0)
MCV: 94.8 fL (ref 80.0–100.0)
Monocytes Absolute: 1.3 10*3/uL — ABNORMAL HIGH (ref 0.1–1.0)
Monocytes Relative: 9 %
Neutro Abs: 10.7 10*3/uL — ABNORMAL HIGH (ref 1.7–7.7)
Neutrophils Relative %: 74 %
Platelets: 580 10*3/uL — ABNORMAL HIGH (ref 150–400)
RBC: 4.61 MIL/uL (ref 4.22–5.81)
RDW: 12.8 % (ref 11.5–15.5)
WBC: 14.5 10*3/uL — ABNORMAL HIGH (ref 4.0–10.5)
nRBC: 0 % (ref 0.0–0.2)

## 2021-09-06 LAB — URINALYSIS, ROUTINE W REFLEX MICROSCOPIC
Bilirubin Urine: NEGATIVE
Glucose, UA: NEGATIVE mg/dL
Ketones, ur: NEGATIVE mg/dL
Leukocytes,Ua: NEGATIVE
Nitrite: NEGATIVE
Protein, ur: 100 mg/dL — AB
Specific Gravity, Urine: 1.03 (ref 1.005–1.030)
pH: 5 (ref 5.0–8.0)

## 2021-09-06 LAB — COMPREHENSIVE METABOLIC PANEL
ALT: 19 U/L (ref 0–44)
AST: 19 U/L (ref 15–41)
Albumin: 3.7 g/dL (ref 3.5–5.0)
Alkaline Phosphatase: 87 U/L (ref 38–126)
Anion gap: 12 (ref 5–15)
BUN: 30 mg/dL — ABNORMAL HIGH (ref 8–23)
CO2: 32 mmol/L (ref 22–32)
Calcium: 9.6 mg/dL (ref 8.9–10.3)
Chloride: 92 mmol/L — ABNORMAL LOW (ref 98–111)
Creatinine, Ser: 1.46 mg/dL — ABNORMAL HIGH (ref 0.61–1.24)
GFR, Estimated: 54 mL/min — ABNORMAL LOW (ref >60)
Glucose, Bld: 108 mg/dL — ABNORMAL HIGH (ref 70–99)
Potassium: 4.6 mmol/L (ref 3.5–5.1)
Sodium: 136 mmol/L (ref 135–145)
Total Bilirubin: 0.4 mg/dL (ref 0.3–1.2)
Total Protein: 7.9 g/dL (ref 6.5–8.1)

## 2021-09-06 LAB — RESP PANEL BY RT-PCR (FLU A&B, COVID) ARPGX2
Influenza A by PCR: NEGATIVE
Influenza B by PCR: NEGATIVE
SARS Coronavirus 2 by RT PCR: NEGATIVE

## 2021-09-06 LAB — LIPASE, BLOOD: Lipase: 982 U/L — ABNORMAL HIGH (ref 11–51)

## 2021-09-06 MED ORDER — HEPARIN SODIUM (PORCINE) 5000 UNIT/ML IJ SOLN
5000.0000 [IU] | Freq: Three times a day (TID) | INTRAMUSCULAR | Status: DC
  Administered 2021-09-07 – 2021-09-09 (×8): 5000 [IU] via SUBCUTANEOUS
  Filled 2021-09-06 (×8): qty 1

## 2021-09-06 MED ORDER — LEVOFLOXACIN IN D5W 250 MG/50ML IV SOLN
250.0000 mg | INTRAVENOUS | Status: DC
  Filled 2021-09-06: qty 50

## 2021-09-06 MED ORDER — ONDANSETRON HCL 4 MG/2ML IJ SOLN
4.0000 mg | Freq: Four times a day (QID) | INTRAMUSCULAR | Status: DC | PRN
  Administered 2021-09-06: 4 mg via INTRAVENOUS
  Filled 2021-09-06: qty 2

## 2021-09-06 MED ORDER — SODIUM CHLORIDE 0.9 % IV SOLN: INTRAVENOUS | Status: DC

## 2021-09-06 MED ORDER — NICOTINE 21 MG/24HR TD PT24
21.0000 mg | MEDICATED_PATCH | Freq: Every day | TRANSDERMAL | Status: DC | PRN
  Administered 2021-09-07 – 2021-09-09 (×3): 21 mg via TRANSDERMAL
  Filled 2021-09-06 (×3): qty 1

## 2021-09-06 MED ORDER — SODIUM CHLORIDE 0.9 % IV BOLUS: 1000.0000 mL | Freq: Once | INTRAVENOUS | Status: DC

## 2021-09-06 MED ORDER — SODIUM CHLORIDE 0.9 % IV SOLN
1.0000 g | Freq: Four times a day (QID) | INTRAVENOUS | Status: DC
  Administered 2021-09-06 – 2021-09-10 (×16): 1 g via INTRAVENOUS
  Filled 2021-09-06 (×8): qty 1000
  Filled 2021-09-06: qty 1
  Filled 2021-09-06 (×12): qty 1000

## 2021-09-06 MED ORDER — MORPHINE SULFATE (PF) 2 MG/ML IV SOLN
1.0000 mg | INTRAVENOUS | Status: DC | PRN
  Administered 2021-09-06 – 2021-09-08 (×6): 2 mg via INTRAVENOUS
  Filled 2021-09-06 (×6): qty 1

## 2021-09-06 MED ORDER — SODIUM CHLORIDE 0.9 % IV BOLUS
1000.0000 mL | Freq: Once | INTRAVENOUS | Status: AC
  Administered 2021-09-06: 1000 mL via INTRAVENOUS

## 2021-09-06 MED ORDER — LEVOFLOXACIN IN D5W 500 MG/100ML IV SOLN
500.0000 mg | Freq: Once | INTRAVENOUS | Status: AC
  Administered 2021-09-06: 500 mg via INTRAVENOUS
  Filled 2021-09-06: qty 100

## 2021-09-06 MED ORDER — FLUTICASONE PROPIONATE 50 MCG/ACT NA SUSP
1.0000 | Freq: Every day | NASAL | Status: DC
  Administered 2021-09-06 – 2021-09-09 (×4): 1 via NASAL
  Filled 2021-09-06: qty 16

## 2021-09-06 MED ORDER — LEVOFLOXACIN IN D5W 250 MG/50ML IV SOLN
250.0000 mg | INTRAVENOUS | Status: DC
  Administered 2021-09-07: 250 mg via INTRAVENOUS
  Filled 2021-09-06 (×3): qty 50

## 2021-09-06 MED ORDER — PANTOPRAZOLE SODIUM 40 MG IV SOLR
40.0000 mg | Freq: Two times a day (BID) | INTRAVENOUS | Status: DC
  Administered 2021-09-06 – 2021-09-10 (×8): 40 mg via INTRAVENOUS
  Filled 2021-09-06 (×8): qty 10

## 2021-09-06 MED ORDER — SODIUM CHLORIDE 0.9 % IV SOLN
12.5000 mg | Freq: Four times a day (QID) | INTRAVENOUS | Status: DC | PRN
  Administered 2021-09-06: 12.5 mg via INTRAVENOUS
  Filled 2021-09-06: qty 0.5

## 2021-09-06 NOTE — ED Provider Notes (Signed)
[facility] Provider Note   CSN: [id number] Arrival date & time: 09/06/21  1359     History  Chief Complaint  Patient presents with   Decreased Appetite   Weakness    Michael Brady is a 66 y.o. 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  He reports reduced urinary frequency and generalized fatigue.  He has had no documented fevers or chills.  He reports a bad taste in his mouth and nausea which is not controlled by Zofran and preventing him from eating and drinking.  He was seen by his PCP today and sent here for hydration.  He denies abdominal pain, states he had some soreness around his incision site yesterday which he blames on dry heaving.  He has had no abdominal distention or significant pain.  The history is provided by the patient.      Home Medications Prior to Admission medications   Medication Sig Start Date End Date Taking? Authorizing Provider  acetaminophen (TYLENOL) 500 MG tablet Take 2 tablets (1,000 mg total) by mouth every 6 (six) hours. 08/31/21   Manuella Ghazi, Pratik D, DO  amoxicillin (AMOXIL) 500 MG tablet Take 2 tablets (1,000 mg total) by mouth 2 (two) times daily for 14 days. 08/31/21 09/14/21  Manuella Ghazi, Pratik D, DO  citalopram (CELEXA) 20 MG tablet TAKE 1 TABLET BY MOUTH DAILY 08/29/21   Coral Spikes, DO  clarithromycin (BIAXIN) 500 MG tablet Take 1 tablet (500 mg total) by mouth 2 (two) times daily for 14 days. 08/31/21 09/14/21  Manuella Ghazi, Pratik D, DO  fluticasone (FLONASE) 50 MCG/ACT nasal spray SHAKE LIQUID AND USE 2 SPRAYS IN [facility] NOSTRIL DAILY Patient not taking: Reported on 09/06/2021 08/31/21    Heath Lark D, DO  Multiple Vitamins-Minerals (MULTIVITAMIN WITH MINERALS) tablet Take 1 tablet by mouth daily.    [provider]  nicotine (NICODERM CQ - DOSED IN MG/24 HOURS) 21 mg/24hr patch Place 1 patch (21 mg total) onto the skin daily as needed (nicotine craving). 08/31/21   Manuella Ghazi, Pratik D, DO  ondansetron (ZOFRAN) 4 MG tablet Take 1 tablet (4 mg total) by mouth every 8 (eight) hours as needed for nausea or vomiting. 09/04/21   Pappayliou, Barnetta Chapel A, DO  oxyCODONE (OXY IR/ROXICODONE) 5 MG immediate release tablet Take 1 tablet (5 mg total) by mouth every 3 (three) hours as needed for moderate pain. Patient not taking: Reported on 09/06/2021 08/31/21   Heath Lark D, DO  pantoprazole (PROTONIX) 40 MG tablet Take 1 tablet (40 mg total) by mouth daily for 14 days. 08/31/21 09/14/21  Heath Lark D, DO      Allergies    Patient has no known allergies.    Review of Systems   Review of Systems  Constitutional:  Positive for appetite change and fatigue. Negative for fever.  HENT:  Negative for congestion and sore throat.   Eyes: Negative.   Respiratory:  Negative for chest tightness and shortness of breath.   Cardiovascular:  Negative for chest pain.  Gastrointestinal:  Positive for nausea and vomiting. Negative for abdominal pain.  Genitourinary:  Positive for decreased urine volume.  Musculoskeletal:  Negative for arthralgias, joint  swelling and neck pain.  Skin: Negative.  Negative for rash and wound.  Neurological:  Positive for weakness. Negative for dizziness, light-headedness, numbness and headaches.  Psychiatric/Behavioral: Negative.     Physical Exam Updated Vital Signs BP (!) 153/101 (BP Location: Left Arm)    Pulse 94    Temp 98.3 F (36.8 C) (Oral)    Resp 19    SpO2 96%  Physical Exam Vitals and nursing note reviewed.  Constitutional:      Appearance: He is well-developed.  HENT:     Head: Normocephalic and atraumatic.  Eyes:     Conjunctiva/sclera:  Conjunctivae normal.  Cardiovascular:     Rate and Rhythm: Normal rate and regular rhythm.     Heart sounds: Normal heart sounds.  Pulmonary:     Effort: Pulmonary effort is normal.     Breath sounds: Normal breath sounds. No wheezing.  Abdominal:     General: Abdomen is flat. Bowel sounds are normal.     Palpations: Abdomen is soft.     Tenderness: There is abdominal tenderness. There is no guarding or rebound.     Comments: Well-appearing upper midline abdominal incision, staples in place, no evidence of incisional infection.  Mild generalized upper  tenderness without guarding or rebound.  Musculoskeletal:        General: Normal range of motion.     Cervical back: Normal range of motion.  Skin:    General: Skin is warm and dry.  Neurological:     Mental Status: He is alert.    ED Results / Procedures / Treatments   Labs (all labs ordered are listed, but only abnormal results are displayed) Labs Reviewed  CBC WITH DIFFERENTIAL/PLATELET - Abnormal; Notable for the following components:      Result Value   WBC 14.5 (*)    Platelets 580 (*)    Neutro Abs 10.7 (*)    Monocytes Absolute 1.3 (*)    All other components within normal limits  COMPREHENSIVE METABOLIC PANEL - Abnormal; Notable for the following components:   Chloride 92 (*)    Glucose, Bld 108 (*)    BUN 30 (*)    Creatinine, Ser 1.46 (*)    GFR, Estimated 54 (*)    All other components within normal limits  URINALYSIS, ROUTINE W REFLEX MICROSCOPIC - Abnormal; Notable for the following components:   Hgb urine dipstick MODERATE (*)    Protein, ur 100 (*)    Bacteria, UA RARE (*)    All other components within normal limits  LIPASE, BLOOD - Abnormal; Notable for the following components:   Lipase 982 (*)    All other components within normal limits  RESP PANEL BY RT-PCR (FLU A&B, COVID) [id number]    EKG None  Radiology No results found.  Procedures Procedures    Medications Ordered in ED Medications   promethazine (PHENERGAN) 12.5 mg in sodium chloride 0.9 % 50 mL IVPB (0 mg Intravenous Stopped 09/06/21 1720)  0.9 %  sodium chloride infusion (has no administration in time range)  sodium chloride 0.9 % bolus 1,000 mL (1,000 mLs Intravenous New Bag/Given 09/06/21 1541)  sodium chloride 0.9 % bolus 1,000 mL (1,000 mLs Intravenous New Bag/Given 09/06/21 1637)    ED Course/ Medical Decision Making/ A&P Clinical Course as of 09/06/21 1709  Wed Sep 06, 2021  1708 Pt currently receiving IV fluids.  Labs obtained sig for leukocytosis 14.5.  given phenergan in place of zofran, still with nausea and intermittent "bad"  taste in mouth.  [JI]    Clinical Course User Index [JI] Evalee Jefferson, PA-C                           Medical Decision Making Amount and/or Complexity of Data Reviewed Labs: ordered.    Details: Abnormal labs significant for leukocytosis at 14.5, he also has dehydration and renal insufficiency his BUN is 30, his creatinine is 1.46.  GFR is 54.  He also has a significant pancreatitis with a lipase of 982.  His LFTs are normal. Radiology: ordered. Discussion of management or test interpretation with external provider(s): Pt discussed with Dr. Okey Dupre including leukocytosis and persistent n/v.  Could be side effect of the clarithromycin, esp given bad taste in mouth.  Would recommend continuing amoxil and PPI, dc clarithromycin adding flagyl in its place. Solitary kidney, dehydration with creatinine elevation.  Recommends medical admission.    5:44 PM Additional labs now returned,  pancreatitis per lipase of 982.  Pt does not drink etoh.  Dr. Okey Dupre has requested non contrast CT abd/pelvis.  She will see pt in am. Medical admission.  Call placed to the hospitalist service.  Discussed with Dr. [facility] who accepts pt for admission.  CT currently pending. Dr. [facility] points out that clarithromycin can also be the source of pancreatitis.   Risk Prescription drug management. Decision  regarding hospitalization.           Final Clinical Impression(s) / ED Diagnoses Final diagnoses:  Dehydration  Acute pancreatitis, unspecified complication status, unspecified pancreatitis type  Nausea and vomiting, unspecified vomiting type  Post-operative state    Rx / DC Orders ED Discharge Orders     None         Landis Martins 09/06/21 1805    Hayden Rasmussen, MD 09/07/21 [id number]

## 2021-09-06 NOTE — H&P (Signed)
TRH H&P   Patient Demographics:    Michael Brady, is a 67 y.o. male  MRN: [id number]   DOB - [birth year]  Admit Date - 09/06/2021  Outpatient Primary MD for the patient is Coral Spikes, DO  Referring MD/NP/PA: PA IDol    Patient coming from: Home/PCP  Chief Complaint  Patient presents with   Decreased Appetite   Weakness      HPI:    Michael Brady  is a 66 y.o. 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 -Patient with recent hospitalization where hewas discharged from here on February 23 for surgical repair of a perforated gastric ulcer and incarcerated incisional hernia, he was discharged on amoxicillin clarithromycin and PPI for H. Pylori -Patient was sent by general surgery to ED for evaluation for nausea, vomiting and abdominal pain, patient reports the first 2 days after discharge she had no complaints, but then he started to develop some generalized weakness, fatigue, poor appetite and oral intake, he started to have nausea, and he started to have abdominal pain and vomiting over the last 24 hours, he was seen by his PCP he was sent for further evaluation.  He denies any fever, chills, dysuria, polyuria, hemoptysis or coffee-ground emesis. -Urgency department work-up significant for a creatinine of 1.46, lipase of 982, white blood cell count of 14.5, CT abdomen with no pancreatic or gallbladder findings, but significant for SBO and left lower abdomen, [facility] hospitalist consulted to admit.    Review of systems:    In addition to the HPI above,  A full 10 point Review of Systems was done, except as stated above, all other Review of Systems were negative.   With Past History of the following :    Past  Medical History:  Diagnosis Date   Cancer ([place])    left renal pelvis cnacer-12/05/2017   Diverticulosis of colon    NO ISSUES IN OVER 12 YEARS    Dysuria    Hematuria    10-11-17: REPORTS I "NOTICED SOME BLOOD IN MY URINE TODAY AFTER NOT SEEING ANY SINCE MY SURGERY IN [place] . I JUST HAD MY BLOOD DRAWN AT [facility] THIS LAST MONDAY BUT THEY HAVENT CALLED ME ABOUT IT YET"   History of closed dislocation of shoulder 06/2014   LEFT --- POST IV SEDATION CLOSED REDUCTION IN ED   PONV (postoperative nausea and vomiting)    Renal mass, left    LEFT RENAL COLLECTING SYSTEM MASS   Smokers' cough ([place])    [place]   Wears dentures    Wears glasses       Past Surgical History:  Procedure Laterality Date   CARPAL TUNNEL RELEASE Left 01-25-2006   dr Lorin Mercy  [facility]   and  LEFT THUMB PULLEY RELEASE   COLONOSCOPY N/A 05/07/2017   Procedure: COLONOSCOPY;  Surgeon: Aviva Signs, MD;  [facility];  Service: Gastroenterology;  Laterality: N/A;   CYSTOSCOPY W/ RETROGRADES Right 07/25/2018   Procedure: CYSTOSCOPY WITH RETROGRADE PYELOGRAM;  Surgeon: Alexis Frock, MD;  Location: [facility];  Service: Urology;  Laterality: Right;   CYSTOSCOPY WITH URETEROSCOPY AND STENT PLACEMENT Left 07/29/2017   Procedure: CYSTOSCOPY WITH URETEROSCOPY AND STENT PLACEMENT;  Surgeon: [name], MD;  Location: [facility];  Service: Urology;  Laterality: Left;   CYSTOSCOPY/URETEROSCOPY/HOLMIUM LASER/STENT PLACEMENT Left 07/15/2017   Procedure: CYSTOSCOPY, ATTEMTED URETEROSCOPY/,STENT PLACEMENT;  Surgeon: [name], MD;  Location[facility];  Service: Urology;  Laterality: Left;   CYSTOSCOPY/URETEROSCOPY/HOLMIUM LASER/STENT PLACEMENT Left 10/21/2017   Procedure: CYSTOSCOPY, RETROGRADE /URETEROSCOPY LEFT UPPER TRACT,BIOPSY, [name] LASER/STENT PLACEMENT;  Surgeon: [name], MD;  Location: [facility];  Service: Urology;  Laterality:  Left;   GASTRORRHAPHY N/A 08/26/2021   Procedure: GASTRORRHAPHY;  Surgeon: Rusty Aus, DO;  Location: [facility];  Service: General;  Laterality: N/A;   HOLMIUM LASER APPLICATION Left 7/82/9562   Procedure: HOLMIUM LASER APPLICATION;  Surgeon: [name], MD;  Location: [facility];  Service: Urology;  Laterality: Left;   INCISIONAL HERNIA REPAIR Right 08/26/2021   Procedure: HERNIA REPAIR INCISIONAL;  Surgeon: Rusty Aus, DO;  Location: [facility];  Service: General;  Laterality: Right;   LAPAROTOMY N/A 08/26/2021   Procedure: EXPLORATORY LAPAROTOMY;  Surgeon: Rusty Aus, DO;  Location: [facility];  Service: General;  Laterality: N/A;   ROBOT ASSITED LAPAROSCOPIC NEPHROURETERECTOMY Left 12/11/2017   Procedure: XI ROBOT ASSITED LAPAROSCOPIC NEPHROURETERECTOMY;  Surgeon: Alexis Frock, MD;  Location: [facility];  Service: Urology;  Laterality: Left;   SHOULDER SURGERY Right 1997   ROTATOR CUFF REPAIR    THULIUM LASER TURP (TRANSURETHRAL RESECTION OF PROSTATE) Left 07/29/2017   Procedure: CYSTOSCOPY, LEFT URETEROSCOPY WITH TUMOR BIOPSYTHULLIUM LASER ABLATION OF TUMOR AND LEFT URETERAL STENT EXCHANGE;  Surgeon: [name], MD;  Location: [facility];  Service: Urology;  Laterality: Left;   TRANSURETHRAL RESECTION OF BLADDER TUMOR N/A 07/25/2018   Procedure: TRANSURETHRAL RESECTION OF BLADDER TUMOR (TURBT);  Surgeon: Alexis Frock, MD;  Location: [facility];  Service: Urology;  Laterality: N/A;   WISDOM TOOTH EXTRACTION        Social History:     Social History   Tobacco Use   Smoking status: Every Day    Packs/day: 1.50    Years: 40.00    Pack years: 60.00    Types: Cigarettes   Smokeless tobacco: Never  Substance Use Topics   Alcohol use: No        Family History :     Family History  Problem Relation Age of Onset   Colon cancer Neg Hx      Home Medications:   Prior to Admission  medications   Medication Sig Start Date End Date Taking? Authorizing Provider  acetaminophen (TYLENOL) 500 MG tablet Take 2 tablets (1,000 mg total) by mouth every 6 (six) hours. 08/31/21  Yes Shah, Pratik D, DO  amoxicillin (AMOXIL) 500 MG tablet Take 2 tablets (1,000 mg total) by mouth 2 (two) times daily for 14 days. 08/31/21 09/14/21 Yes Shah, Pratik D, DO  aspirin EC 81 MG tablet Take 81 mg by mouth daily. Swallow whole.   Yes [provider]  citalopram (CELEXA) 20 MG tablet  TAKE 1 TABLET BY MOUTH DAILY 08/29/21  Yes Cook, Jayce G, DO  clarithromycin (BIAXIN) 500 MG tablet Take 1 tablet (500 mg total) by mouth 2 (two) times daily for 14 days. 08/31/21 09/14/21 Yes Shah, Pratik D, DO  Multiple Vitamins-Minerals (MULTIVITAMIN WITH MINERALS) tablet Take 1 tablet by mouth daily.   Yes [provider]  nicotine (NICODERM CQ - DOSED IN MG/24 HOURS) 21 mg/24hr patch Place 1 patch (21 mg total) onto the skin daily as needed (nicotine craving). 08/31/21  Yes Manuella Ghazi, Pratik D, DO  ondansetron (ZOFRAN) 4 MG tablet Take 1 tablet (4 mg total) by mouth every 8 (eight) hours as needed for nausea or vomiting. 09/04/21  Yes Pappayliou, Barnetta Chapel A, DO  oxyCODONE (OXY IR/ROXICODONE) 5 MG immediate release tablet Take 1 tablet (5 mg total) by mouth every 3 (three) hours as needed for moderate pain. 08/31/21  Yes Shah, Pratik D, DO  pantoprazole (PROTONIX) 40 MG tablet Take 1 tablet (40 mg total) by mouth daily for 14 days. 08/31/21 09/14/21 Yes Shah, Pratik D, DO  fluticasone (FLONASE) 50 MCG/ACT nasal spray SHAKE LIQUID AND USE 2 SPRAYS IN EACH NOSTRIL DAILY 08/31/21   Manuella Ghazi, Pratik D, DO     Allergies:    No Known Allergies   Physical Exam:   Vitals  Blood pressure (!) 153/101, pulse 94, temperature 98.3 F (36.8 C), temperature source Oral, resp. rate 19, SpO2 96 %.   1. General well developed male, laying in bed, no apparent distress  2. Normal affect and insight, Not Suicidal or Homicidal, Awake  Alert, Oriented X 3.  3. No F.N deficits, ALL C.Nerves Intact, Strength 5/5 all 4 extremities, Sensation intact all 4 extremities, Plantars down going.  4. Ears and Eyes appear Normal, Conjunctivae clear, PERRLA. Moist Oral Mucosa.  5. Supple Neck, No JVD, No cervical lymphadenopathy appriciated, No Carotid Bruits.  6. Symmetrical Chest wall movement, Good air movement bilaterally, CTAB.  7. RRR, No Gallops, Rubs or Murmurs, No Parasternal Heave.  8.  Bowel sounds present, no rebound, no guarding, midline surgical scar with staples, no oozing or discharge, no dehiscence.  9.  No Cyanosis, Normal Skin Turgor, No Skin Rash or Bruise.  10. Good muscle tone,  joints appear normal , no effusions, Normal ROM.  11. No Palpable Lymph Nodes in Neck or Axillae     Data Review:    CBC Recent Labs  Lab 08/31/21 0537 09/06/21 1513  WBC 10.9* 14.5*  HGB 12.3* 14.1  HCT 38.8* 43.7  PLT 331 580*  MCV 93.7 94.8  MCH 29.7 30.6  MCHC 31.7 32.3  RDW 13.0 12.8  LYMPHSABS 1.0 2.3  MONOABS 1.1* 1.3*  EOSABS 0.0 0.1  BASOSABS 0.0 0.1   ------------------------------------------------------------------------------------------------------------------  Chemistries  Recent Labs  Lab 09/06/21 1513  NA 136  K 4.6  CL 92*  CO2 32  GLUCOSE 108*  BUN 30*  CREATININE 1.46*  CALCIUM 9.6  AST 19  ALT 19  ALKPHOS 87  BILITOT 0.4   ------------------------------------------------------------------------------------------------------------------ estimated creatinine clearance is 47.7 mL/min (A) (by C-G formula based on SCr of 1.46 mg/dL (H)). ------------------------------------------------------------------------------------------------------------------ No results for input(s): TSH, T4TOTAL, T3FREE, THYROIDAB in the last 72 hours.  Invalid input(s): FREET3  Coagulation profile No results for input(s): INR, PROTIME in the last 168  hours. ------------------------------------------------------------------------------------------------------------------- No results for input(s): DDIMER in the last 72 hours. -------------------------------------------------------------------------------------------------------------------  Cardiac Enzymes No results for input(s): CKMB, TROPONINI, MYOGLOBIN in the last 168 hours.  Invalid input(s): CK ------------------------------------------------------------------------------------------------------------------  No results found for: BNP   ---------------------------------------------------------------------------------------------------------------  Urinalysis    Component Value Date/Time   COLORURINE YELLOW 09/06/2021 [place] 09/06/2021 1715   LABSPEC 1.030 09/06/2021 1715   PHURINE 5.0 09/06/2021 1715   GLUCOSEU NEGATIVE 09/06/2021 1715   HGBUR MODERATE (A) 09/06/2021 1715   BILIRUBINUR NEGATIVE 09/06/2021 1715   KETONESUR NEGATIVE 09/06/2021 1715   PROTEINUR 100 (A) 09/06/2021 1715   NITRITE NEGATIVE 09/06/2021 1715   LEUKOCYTESUR NEGATIVE 09/06/2021 1715    ----------------------------------------------------------------------------------------------------------------   Imaging Results:    CT ABDOMEN PELVIS WO CONTRAST  Result Date: 09/06/2021 CLINICAL DATA:  Left upper quadrant abdominal pain. EXAM: CT ABDOMEN AND PELVIS WITHOUT CONTRAST TECHNIQUE: Multidetector CT imaging of the abdomen and pelvis was performed following the standard protocol without IV contrast. RADIATION DOSE REDUCTION: This exam was performed according to the departmental dose-optimization program which includes automated exposure control, adjustment of the mA and/or kV according to patient size and/or use of iterative reconstruction technique. COMPARISON:  CT abdomen pelvis dated 08/26/2021. FINDINGS: Evaluation of this exam is limited in the absence of intravenous contrast. Lower  chest: The visualized lung bases are clear. There is mild elevation of the left hemidiaphragm. There is coronary vascular calcification. Trace free fluid adjacent to the liver and a small focus of air under the right hemidiaphragm, likely postoperative. Hepatobiliary: No focal liver abnormality is seen. No gallstones, gallbladder wall thickening, or biliary dilatation. Pancreas: Unremarkable. No pancreatic ductal dilatation or surrounding inflammatory changes. Spleen: Normal in size without focal abnormality. Adrenals/Urinary Tract: The adrenal glands are unremarkable. Status post prior left nephrectomy. The right kidney, right ureter, and urinary bladder appear unremarkable. Stomach/Bowel: Evaluation of the bowel is limited in the absence of oral contrast. There multiple fluid-filled and dilated loops of small bowel in the upper and mid abdomen measuring up to 5.5 cm in caliber. The distal small bowel and terminal ileum are collapsed. A transition is suspected in the left anterior abdomen (coronal 30/5 and axial 41/2). There is severe sigmoid diverticulosis. Oral contrast from prior study noted throughout the colon. The appendix is normal. Vascular/Lymphatic: Advanced aortoiliac atherosclerotic disease. There is a 3 cm infrarenal abdominal aortic aneurysm. The IVC is unremarkable. No portal venous gas. There is no adenopathy. Reproductive: The prostate and seminal vesicles are grossly unremarkable. No pelvic mass. Other: Midline vertical anterior abdominal wall surgical incision and cutaneous staples. No fluid collection. Musculoskeletal: Osteopenia with degenerative changes of the spine. No acute osseous pathology. IMPRESSION: 1. Small-bowel obstruction with transition suspected in the left anterior abdomen. 2. Severe sigmoid diverticulosis. 3. Status post prior left nephrectomy. 4. A 3 cm infrarenal abdominal aortic aneurysm. Recommend follow-up ultrasound every 3 years. This recommendation follows ACR consensus  guidelines: White Paper of the ACR Incidental Findings Committee II on Vascular Findings. J Am Coll Radiol 2013; 66:440-347. 5. Aortic Atherosclerosis (ICD10-I70.0). Electronically Signed   By: Anner Crete M.D.   On: 09/06/2021 18:17       Assessment & Plan:    Principal Problem:   Acute pancreatitis Active Problems:   Abdominal pain   Nausea and vomiting   SBO (small bowel obstruction) (HCC)   History of renal pelvis cancer   H/O left nephrectomy   CKD (chronic kidney disease) stage 3, GFR 30-59 ml/min (HCC)   Hyperlipidemia   Smoker   Abdominal pain/nausea/vomiting -Appears to be secondary to acute pancreatitis, and SBO as evident on CT abdomen pelvis -We will keep n.p.o., continue with IV fluids and as  needed medication  Acute pancreatitis -Denies any alcohol abuse or use, no evidence of gallstones on imaging, normal LFTs, will check lipid panel(triglycerides). -Will discontinue clarithromycin as may be causing acute pancreatitis  SBO -Patient on imaging, will keep on IV fluids, n.p.o., as needed nausea and pain medications -NGT per gen surgery recommendation -General surgery to see in am.  History of incisional hernia -Dispo surgical repair 2/18, abdominal wound appears to be healing with no complications,  -Surgery to see in a.m.  History of renal pelvis cancer/history of nephrectomy/CKD stage III -Monitor renal function closely, and avoid nephrotoxic medications  Smoker -Continue with nicotine patch, patient report he quit smoking last week  Hyperlipidemia -not on any medications  History of H. Pylori/peptic ulcer disease with perforation, pneumoperitoneum -No evidence of perforation or pneumoperitoneum on imaging -Patient is n.p.o. we will hold his H. pylori regimen, and especially clarithromycin and due to acute pancreatitis        DVT Prophylaxis Heparin   AM Labs Ordered, also please review Full Orders  Family Communication: Admission,  patients condition and plan of care including tests being ordered have been discussed with the patient and wife who indicate understanding and agree with the plan and Code Status.  Code Status Full  Likely DC to    Condition GUARDED    Consults called: general sx by ED    Admission status: inpatient    Time spent in minutes : 70 minutes   Phillips Climes M.D on 09/06/2021 at 7:14 PM   [facility] - Office  [phone]

## 2021-09-06 NOTE — ED Notes (Signed)
Patient transported to CT 

## 2021-09-06 NOTE — ED Notes (Addendum)
Went to place NG tube, patient and family stated that the provider said that the patient could hold off for now on NG tube since he hasn't vomited.  Messaged provider, provider said he talked to surgery who just put a note in about having the NG tube placed.  Called nurse on floor to make aware.  ?

## 2021-09-06 NOTE — Progress Notes (Signed)
Attempt to get report x1 ?

## 2021-09-06 NOTE — Progress Notes (Signed)
Evaluated CT abdomen and pelvis which is concerning for small bowel obstruction with possible transition point in the left anterior abdomen.  Imaging evaluated by myself.  Order placed for NG tube and NPO.  Will manage patient conservatively, as site of possible transition point is unlikely to be related to recent surgery for gastroduodenal ulcer perforation. ? ?Graciella Freer, DO ?[facility] ?[address], [place] [address] ?[phone] (office) ? ?

## 2021-09-06 NOTE — Progress Notes (Signed)
? ?Subjective:  ?Patient ID: Michael Brady, male    DOB: 12/15/1955  Age: 66 y.o. MRN: [id number] ? ?CC: ?Chief Complaint  ?Patient presents with  ? Hospitalization Follow-up  ?  Patient was in hospital for 6 days for diverticulitis with preformation s/p repair  ? ? ?HPI: ? ?66 year old male with tobacco abuse, history of renal pelvis cancer status post left nephrectomy, CKD presents for hospital follow-up. ? ?Patient's hospital course, discharge summary, labs, operative note reviewed in detail.  Patient presented with severe abdominal pain.  Was found to have pneumoperitoneum and an incarcerated incisional hernia.  Went to surgery and was found to have perforated gastric ulcer.  Hernia was repaired as well as the gastric ulcer.  He was tolerating a soft diet prior to discharge.  He was discharged on treatment for H. pylori with amoxicillin, PPI, and clarithromycin. ? ?Patient endorses compliance with medications.  However, he has had persistent nausea and vomiting.  He has had very little PO intake.  He reports some pain at the incision site.  He does not feel like he can eat and drink secondary to the nausea.  Patient is visibly shaking in the exam room at this time.  No reports of fever.  Per the wife, his emesis has been bilious.  Wife and daughter are concerned about significant weight loss.  He has lost 20 pounds since I last saw him in December.  He is passing gas.  Last bowel movement was 2 days ago. ? ?Patient Active Problem List  ? Diagnosis Date Noted  ? Intractable nausea and vomiting 09/06/2021  ? 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 Pneumoperitoneum 08/26/2021  ? Severe Diverticulosis 08/26/2021  ? H/O left nephrectomy 08/26/2021  ? Incarcerated incisional hernia   ? CKD (chronic kidney disease) stage 3, GFR 30-59 ml/min (HCC) 06/28/2021  ? Smoker 05/31/2020  ? Depression, major, single episode, moderate ([place]) 11/21/2017  ? Hyperlipidemia 04/03/2017  ? ? ?Social Hx   ?Social History   ? ?Socioeconomic History  ? Marital status: Married  ?  Spouse name: Not on file  ? Number of children: Not on file  ? Years of education: Not on file  ? Highest education level: Not on file  ?Occupational History  ? Not on file  ?Tobacco Use  ? Smoking status: Every Day  ?  Packs/day: 1.50  ?  Years: 40.00  ?  Pack years: 60.00  ?  Types: Cigarettes  ? Smokeless tobacco: Never  ?Vaping Use  ? Vaping Use: Never used  ?Substance and Sexual Activity  ? Alcohol use: No  ? Drug use: No  ? Sexual activity: Not on file  ?Other Topics Concern  ? Not on file  ?Social History Narrative  ? Not on file  ? ?Social Determinants of Health  ? ?Financial Resource Strain: Not on file  ?Food Insecurity: Not on file  ?Transportation Needs: Not on file  ?Physical Activity: Not on file  ?Stress: Not on file  ?Social Connections: Not on file  ? ? ?Review of Systems  ?Constitutional:  Positive for activity change, appetite change and unexpected weight change. Negative for fever.  ?Gastrointestinal:  Positive for nausea and vomiting.  ? ? ?Objective:  ?BP 120/74   Pulse (!) 110   Temp 98.1 ?F (36.7 ?C) (Oral)   Ht 5\' 9"  (1.753 m)   Wt 143 lb 9.6 oz (65.1 kg)   SpO2 97%   BMI 21.21 kg/m?  ? ?BP/Weight 09/06/2021 08/31/2021 08/27/2021  ?  Systolic BP 546 568 -  ?Diastolic BP 74 92 -  ?Wt. (Lbs) 143.6 - 161.6  ?BMI 21.21 - 23.86  ? ? ?Physical Exam ?Constitutional:   ?   Comments: Appears mildly ill.  Tremorous.  ?HENT:  ?   Head: Normocephalic and atraumatic.  ?   Mouth/Throat:  ?   Mouth: Mucous membranes are dry.  ?Eyes:  ?   General:     ?   Right eye: No discharge.     ?   Left eye: No discharge.  ?   Conjunctiva/sclera: Conjunctivae normal.  ?Cardiovascular:  ?   Rate and Rhythm: Regular rhythm. Tachycardia present.  ?Pulmonary:  ?   Effort: Pulmonary effort is normal.  ?   Breath sounds: No wheezing.  ?Abdominal:  ?   General: There is no distension.  ?   Palpations: Abdomen is soft.  ?   Tenderness: There is no abdominal tenderness.   ?   Comments: Incision site without evidence of infection.  ?Neurological:  ?   Mental Status: He is alert.  ?Psychiatric:     ?   Mood and Affect: Mood normal.     ?   Behavior: Behavior normal.  ? ? ?Lab Results  ?Component Value Date  ? WBC 10.9 (H) 08/31/2021  ? HGB 12.3 (L) 08/31/2021  ? HCT 38.8 (L) 08/31/2021  ? PLT 331 08/31/2021  ? GLUCOSE 106 (H) 08/30/2021  ? CHOL 215 (H) 09/02/2018  ? TRIG 132 09/02/2018  ? HDL 47 09/02/2018  ? LDLCALC 142 (H) 09/02/2018  ? ALT 16 08/29/2021  ? AST 15 08/29/2021  ? NA 135 08/30/2021  ? K 4.5 08/30/2021  ? CL 97 (L) 08/30/2021  ? CREATININE 1.02 08/30/2021  ? BUN 19 08/30/2021  ? CO2 29 08/30/2021  ? ? ? ?Assessment & Plan:  ? ?Problem List Items Addressed This Visit   ? ?  ? Digestive  ? Intractable nausea and vomiting  ?  Has not responded well to Zofran.  Continues with poor PO intake. ?Patient, family members, and I feel that going to the hospital is prudent.  Patient needs laboratory studies, IV fluids, and close monitoring.  I have let his surgeon know about his current status. ?I have spoken with the ER via secure chat. ?Patient is going directly to the hospital for further evaluation and management. ?  ?  ? ? ?Thersa Salt DO ?[place] ? ?

## 2021-09-06 NOTE — ED Triage Notes (Signed)
Pt reports he is dehydrated d/t decreased appetite and po intake after being discharged from this facility 1 week ago. Pt referred to ED by Dr. Lacinda Axon. ?

## 2021-09-06 NOTE — Assessment & Plan Note (Signed)
Has not responded well to Zofran.  Continues with poor PO intake. ?Patient, family members, and I feel that going to the hospital is prudent.  Patient needs laboratory studies, IV fluids, and close monitoring.  I have let his surgeon know about his current status. ?I have spoken with the ER via secure chat. ?Patient is going directly to the hospital for further evaluation and management. ?

## 2021-09-06 NOTE — Progress Notes (Signed)
I was notified by the patient's primary care doctor, Dr. Lacinda Axon, who stated that the patient was seen in the office today with actable nausea, vomiting, and minimal p.o. intake.  He stated that he appeared dehydrated and that he was going to send him to the ED. ? ?Spoke with Evalee Jefferson, PA about the patient after presentation to the ED.  According to the ED provider, the patient has had nausea with some episodes of emesis and a poor taste in his mouth.  He does have some abdominal pain, but his pain seems more incisional in nature, and his abdominal exam was relatively benign.  Patient has a mild leukocytosis of 14.5, and appears dehydrated with an elevated creatinine of 1.46 compared to 1.02 when he was discharged from the hospital.  His lipase is elevated at 982.  Given the patient was discharged with H. pylori triple therapy, question whether part of his symptoms might be related to adverse effects from clarithromycin.  After discussions with the ED provider, will adjust the patient to Flagyl from the clarithromycin to see if this alleviates some of his symptoms.  Also would recommend CT of the abdomen and pelvis without IV contrast to evaluate to the pancreas which could be causing his nausea, vomiting, and leukocytosis.  Will await results of CT, but likely plan for admission to hospitalist for management of dehydration and pancreatitis.  General surgery will follow and I will plan to see him in the morning. ? ?Graciella Freer, DO ?[facility] ?[place], [place] [address] ?[phone] (office) ? ?

## 2021-09-06 NOTE — Progress Notes (Addendum)
MEDICATION RELATED CONSULT NOTE - INITIAL   Pharmacy Consult for H pylori treatment   No Known Allergies   Vital Signs: Temp: 98.3 F (36.8 C) (03/01 1412) Temp Source: Oral (03/01 1412) BP: 153/101 (03/01 1700) Pulse Rate: 94 (03/01 1700)   Labs: Recent Labs    09/06/21 1513  WBC 14.5*  HGB 14.1  HCT 43.7  PLT 580*  CREATININE 1.46*  ALBUMIN 3.7  PROT 7.9  AST 19  ALT 19  ALKPHOS 87  BILITOT 0.4   Estimated Creatinine Clearance: 47.7 mL/min (A) (by C-G formula based on SCr of 1.46 mg/dL (H)).   Microbiology: Recent Results (from the past 720 hour(s))  Resp Panel by RT-PCR (Flu A&B, Covid) Nasopharyngeal Swab     Status: None   Collection Time: 08/26/21  6:11 AM   Specimen: Nasopharyngeal Swab; Nasopharyngeal(NP) swabs in vial transport medium  Result Value Ref Range Status   SARS Coronavirus 2 by RT PCR NEGATIVE NEGATIVE Final    Comment: (NOTE) SARS-CoV-2 target nucleic acids are NOT DETECTED.  The SARS-CoV-2 RNA is generally detectable in upper respiratory specimens during the acute phase of infection. The lowest concentration of SARS-CoV-2 viral copies this assay can detect is 138 copies/mL. A negative result does not preclude SARS-Cov-2 infection and should not be used as the sole basis for treatment or other patient management decisions. A negative result may occur with  improper specimen collection/handling, submission of specimen other than nasopharyngeal swab, presence of viral mutation(s) within the areas targeted by this assay, and inadequate number of viral copies(<138 copies/mL). A negative result must be combined with clinical observations, patient history, and epidemiological information. The expected result is Negative.  Fact Sheet for Patients:  EntrepreneurPulse.com.au  Fact Sheet for Healthcare Providers:  IncredibleEmployment.be  This test is no t yet approved or cleared by the [place] FDA and   has been authorized for detection and/or diagnosis of SARS-CoV-2 by FDA under an Emergency Use Authorization (EUA). This EUA will remain  in effect (meaning this test can be used) for the duration of the COVID-19 declaration under Section 564(b)(1) of the Act, 21 U.S.C.section 360bbb-3(b)(1), unless the authorization is terminated  or revoked sooner.       Influenza A by PCR NEGATIVE NEGATIVE Final   Influenza B by PCR NEGATIVE NEGATIVE Final    Comment: (NOTE) The Xpert Xpress SARS-CoV-2/FLU/RSV plus assay is intended as an aid in the diagnosis of influenza from Nasopharyngeal swab specimens and should not be used as a sole basis for treatment. Nasal washings and aspirates are unacceptable for Xpert Xpress SARS-CoV-2/FLU/RSV testing.  Fact Sheet for Patients: EntrepreneurPulse.com.au  Fact Sheet for Healthcare Providers: IncredibleEmployment.be  This test is not yet approved or cleared by the [place] FDA and has been authorized for detection and/or diagnosis of SARS-CoV-2 by FDA under an Emergency Use Authorization (EUA). This EUA will remain in effect (meaning this test can be used) for the duration of the COVID-19 declaration under Section 564(b)(1) of the Act, 21 U.S.C. section 360bbb-3(b)(1), unless the authorization is terminated or revoked.  Performed at [facility], [address] [place], [place] [address]   Aerobic/Anaerobic Culture w Gram Stain (surgical/deep wound)     Status: None   Collection Time: 08/26/21  9:27 AM   Specimen: PATH Other; Tissue  Result Value Ref Range Status   Specimen Description   Final    TISSUE Performed at [facility], [address] [place], [place] [address]    Special Requests  Final    PERITONEAL Performed at [facility], [address] [place], [place] [address]    Gram Stain   Final    RARE WBC PRESENT, PREDOMINANTLY PMN NO ORGANISMS SEEN    Culture   Final    RARE  STAPHYLOCOCCUS AUREUS RARE STREPTOCOCCUS MITIS/ORALIS RARE ESCHERICHIA COLI NO ANAEROBES ISOLATED Performed at [facility], [address] [place], [place] [address]    Report Status 08/31/2021 FINAL  Final   Organism ID, Bacteria STAPHYLOCOCCUS AUREUS  Final   Organism ID, Bacteria STREPTOCOCCUS MITIS/ORALIS  Final   Organism ID, Bacteria ESCHERICHIA COLI  Final      Susceptibility   Escherichia coli - MIC*    AMPICILLIN 4 SENSITIVE Sensitive     CEFAZOLIN <=4 SENSITIVE Sensitive     CEFEPIME <=0.12 SENSITIVE Sensitive     CEFTAZIDIME <=1 SENSITIVE Sensitive     CEFTRIAXONE <=0.25 SENSITIVE Sensitive     CIPROFLOXACIN <=0.25 SENSITIVE Sensitive     GENTAMICIN 4 SENSITIVE Sensitive     IMIPENEM 1 SENSITIVE Sensitive     TRIMETH/SULFA <=20 SENSITIVE Sensitive     AMPICILLIN/SULBACTAM <=2 SENSITIVE Sensitive     PIP/TAZO <=4 SENSITIVE Sensitive     * RARE ESCHERICHIA COLI   Staphylococcus aureus - MIC*    CIPROFLOXACIN <=0.5 SENSITIVE Sensitive     ERYTHROMYCIN RESISTANT Resistant     GENTAMICIN <=0.5 SENSITIVE Sensitive     OXACILLIN <=0.25 SENSITIVE Sensitive     TETRACYCLINE <=1 SENSITIVE Sensitive     VANCOMYCIN 1 SENSITIVE Sensitive     TRIMETH/SULFA <=10 SENSITIVE Sensitive     CLINDAMYCIN RESISTANT Resistant     RIFAMPIN <=0.5 SENSITIVE Sensitive     Inducible Clindamycin POSITIVE Resistant     * RARE STAPHYLOCOCCUS AUREUS   Streptococcus mitis/oralis - MIC*    TETRACYCLINE >=16 RESISTANT Resistant     VANCOMYCIN 0.25 SENSITIVE Sensitive     CLINDAMYCIN >=1 RESISTANT Resistant     PENICILLIN Value in next row Intermediate      INTERMEDIATE0.25    CEFTRIAXONE Value in next row Sensitive      SENSITIVE0.5    * RARE STREPTOCOCCUS MITIS/ORALIS  Culture, blood (routine x 2)     Status: None   Collection Time: 08/26/21 12:37 PM   Specimen: BLOOD RIGHT HAND  Result Value Ref Range Status   Specimen Description   Final    BLOOD RIGHT HAND BOTTLES DRAWN AEROBIC  AND ANAEROBIC Performed at [facility], [address], [place], [place] [address]    Special Requests   Final    Blood Culture results may not be optimal due to an excessive volume of blood received in culture bottles Performed at [facility], [address] [place], [place] [address]    Culture  Setup Time   Final    CORRECTED RESULTS NO ORGANISMS SEEN PREVIOUSLY REPORTED AS: GRAM POSITIVE COCCI ANAEROBIC BOTTLE ONLY CORRECTED RESULTS CALLED TOPark Breed [id number] FCP Performed at [facility], [address], [place], [place] [address]    Culture   Final    NO GROWTH 5 DAYS Performed at [facility], [address] [place], [place] [address]    Report Status 08/31/2021 FINAL  Final  Blood Culture ID Panel (Reflexed)     Status: None   Collection Time: 08/26/21 12:37 PM  Result Value Ref Range Status   Enterococcus faecalis NOT DETECTED NOT DETECTED Final   Enterococcus Faecium NOT DETECTED NOT DETECTED Final  Listeria monocytogenes NOT DETECTED NOT DETECTED Final   Staphylococcus species NOT DETECTED NOT DETECTED Final   Staphylococcus aureus (BCID) NOT DETECTED NOT DETECTED Final   Staphylococcus epidermidis NOT DETECTED NOT DETECTED Final   Staphylococcus lugdunensis NOT DETECTED NOT DETECTED Final   Streptococcus species NOT DETECTED NOT DETECTED Final   Streptococcus agalactiae NOT DETECTED NOT DETECTED Final   Streptococcus pneumoniae NOT DETECTED NOT DETECTED Final   Streptococcus pyogenes NOT DETECTED NOT DETECTED Final   A.calcoaceticus-baumannii NOT DETECTED NOT DETECTED Final   Bacteroides fragilis NOT DETECTED NOT DETECTED Final   Enterobacterales NOT DETECTED NOT DETECTED Final   Enterobacter cloacae complex NOT DETECTED NOT DETECTED Final   Escherichia coli NOT DETECTED NOT DETECTED Final   Klebsiella aerogenes NOT DETECTED NOT DETECTED Final   Klebsiella oxytoca NOT DETECTED NOT DETECTED Final   Klebsiella pneumoniae NOT DETECTED NOT DETECTED  Final   Proteus species NOT DETECTED NOT DETECTED Final   Salmonella species NOT DETECTED NOT DETECTED Final   Serratia marcescens NOT DETECTED NOT DETECTED Final   Haemophilus influenzae NOT DETECTED NOT DETECTED Final   Neisseria meningitidis NOT DETECTED NOT DETECTED Final   Pseudomonas aeruginosa NOT DETECTED NOT DETECTED Final   Stenotrophomonas maltophilia NOT DETECTED NOT DETECTED Final   Candida albicans NOT DETECTED NOT DETECTED Final   Candida auris NOT DETECTED NOT DETECTED Final   Candida glabrata NOT DETECTED NOT DETECTED Final   Candida krusei NOT DETECTED NOT DETECTED Final   Candida parapsilosis NOT DETECTED NOT DETECTED Final   Candida tropicalis NOT DETECTED NOT DETECTED Final   Cryptococcus neoformans/gattii NOT DETECTED NOT DETECTED Final    Comment: Performed at [facility], [address] [place], [place] [address]  Culture, blood (routine x 2)     Status: None   Collection Time: 08/26/21 12:49 PM   Specimen: BLOOD LEFT HAND  Result Value Ref Range Status   Specimen Description   Final    BLOOD LEFT HAND BOTTLES DRAWN AEROBIC AND ANAEROBIC   Special Requests   Final    Blood Culture results may not be optimal due to an excessive volume of blood received in culture bottles   Culture   Final    NO GROWTH 5 DAYS Performed at [facility], [address] [place],  [address]    Report Status 08/31/2021 FINAL  Final     Assessment: 66 yo male presented on 09/06/21 with nausea and weakness. Patient is s/p surgical repair of perforated gastric ulcer and incarcerated incisional hernia and sent home on 2 week course of amoxicillin, PPI and clarithromycin for h. Pylori. Concern adverse effects of clarithromycin (dyspepsia) but symptoms now could be due to CT imaging findings. Discussed with Dr. Okey Dupre bismuth quad therapy (bismuth subsalicylate, metronidazole, tetracycline, PPI) versus levofloxacin triple therapy (levofloxacin, amoxicillin, PPI) options.  Patient now NPO with CT concerning for SBO and since unable to give bismuth subsalicylate will proceed with levofloxacin, ampicillin, and pantoprazole. CrCl ~47 ml/min.   Plan:  Start levofloxacin 250mg  IV q24h (renally dose adjusted) Start ampicillin 1g q6h  Continue pantoprazole 40mg  IV q12h Follow up ability to change to po regimen Monitor renal function   Cristela Felt, PharmD, BCPS Clinical Pharmacist 09/06/2021 6:23 PM  Addendum: Levofloxacin 250mg  IV dosage strength unavailable per RN. Will give levofloxacin 500mg  x1 tonight and start 250mg  tomorrow.   Cristela Felt, PharmD, BCPS Clinical Pharmacist 09/06/2021 9:39 PM

## 2021-09-06 NOTE — Tx Team (Signed)
NG tube advanced 15 cm and hooked up to low wall intermittent suction coffee brown drainage noted. Patient tolerated procedure well. Given pain and nausea medications prior.  ?

## 2021-09-06 NOTE — ED Notes (Signed)
Report attempted x 1

## 2021-09-06 NOTE — Patient Instructions (Signed)
Go straight to the ER. ? ?I have reached out to them to let them know. ? ?I will contact your surgeon as well.  ?

## 2021-09-06 NOTE — ED Triage Notes (Signed)
Pt reports chronic nausea x 1 week without relief by Zofran. Pt did receive Amoxicillin last week as a trial due to being allergic Penicillin 50 years ago. ?

## 2021-09-07 DIAGNOSIS — Z905 Acquired absence of kidney: Secondary | ICD-10-CM

## 2021-09-07 DIAGNOSIS — R112 Nausea with vomiting, unspecified: Secondary | ICD-10-CM

## 2021-09-07 DIAGNOSIS — Z8553 Personal history of malignant neoplasm of renal pelvis: Secondary | ICD-10-CM

## 2021-09-07 DIAGNOSIS — K859 Acute pancreatitis without necrosis or infection, unspecified: Principal | ICD-10-CM

## 2021-09-07 DIAGNOSIS — N1832 Chronic kidney disease, stage 3b: Secondary | ICD-10-CM

## 2021-09-07 DIAGNOSIS — K56609 Unspecified intestinal obstruction, unspecified as to partial versus complete obstruction: Secondary | ICD-10-CM

## 2021-09-07 DIAGNOSIS — F17200 Nicotine dependence, unspecified, uncomplicated: Secondary | ICD-10-CM

## 2021-09-07 DIAGNOSIS — A048 Other specified bacterial intestinal infections: Secondary | ICD-10-CM

## 2021-09-07 LAB — LIPASE, BLOOD: Lipase: 799 U/L — ABNORMAL HIGH (ref 11–51)

## 2021-09-07 LAB — COMPREHENSIVE METABOLIC PANEL
ALT: 15 U/L (ref 0–44)
AST: 16 U/L (ref 15–41)
Albumin: 2.9 g/dL — ABNORMAL LOW (ref 3.5–5.0)
Alkaline Phosphatase: 74 U/L (ref 38–126)
Anion gap: 8 (ref 5–15)
BUN: 27 mg/dL — ABNORMAL HIGH (ref 8–23)
CO2: 29 mmol/L (ref 22–32)
Calcium: 8.8 mg/dL — ABNORMAL LOW (ref 8.9–10.3)
Chloride: 100 mmol/L (ref 98–111)
Creatinine, Ser: 1.24 mg/dL (ref 0.61–1.24)
GFR, Estimated: >60 mL/min (ref >60)
Glucose, Bld: 105 mg/dL — ABNORMAL HIGH (ref 70–99)
Potassium: 4.6 mmol/L (ref 3.5–5.1)
Sodium: 137 mmol/L (ref 135–145)
Total Bilirubin: 0.4 mg/dL (ref 0.3–1.2)
Total Protein: 6.6 g/dL (ref 6.5–8.1)

## 2021-09-07 LAB — CBC
HCT: 38.7 % — ABNORMAL LOW (ref 39.0–52.0)
Hemoglobin: 12.3 g/dL — ABNORMAL LOW (ref 13.0–17.0)
MCH: 30.3 pg (ref 26.0–34.0)
MCHC: 31.8 g/dL (ref 30.0–36.0)
MCV: 95.3 fL (ref 80.0–100.0)
Platelets: 461 10*3/uL — ABNORMAL HIGH (ref 150–400)
RBC: 4.06 MIL/uL — ABNORMAL LOW (ref 4.22–5.81)
RDW: 12.8 % (ref 11.5–15.5)
WBC: 11 10*3/uL — ABNORMAL HIGH (ref 4.0–10.5)
nRBC: 0 % (ref 0.0–0.2)

## 2021-09-07 NOTE — Assessment & Plan Note (Addendum)
-  Planning to resume the use of his statins and heart healthy diet when able to tolerate p.o.'s. ?-Currently with NG tube in place and avoiding oral agents. ?

## 2021-09-07 NOTE — Assessment & Plan Note (Addendum)
-  Complete treatment with the use of ampicillin, Levaquin and PPI. ?-Clarithromycin has been discontinued secondary to acute pancreatitis development. ?

## 2021-09-07 NOTE — Assessment & Plan Note (Addendum)
-  Secondary to acute pancreatitis and SBO ?-Continue treatment as mentioned with conservative management, adequate hydration, slowly advancing diet and PRN analgesics and antiemetics. ?-outpatient follow up with PCP and general surgery. ? ? ?

## 2021-09-07 NOTE — Progress Notes (Signed)
?Progress Note ? ? ?Patient: Michael Brady FVC:[id number] DOB: 10-03-1955 DOA: 09/06/2021     1 ?DOS: the patient was seen and examined on 09/07/2021 ?  ?Brief hospital admission narrative course: ?As per H&P written by Dr. Waldron Labs on 09/06/2021 ?Michael Brady  is a 66 y.o. 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 ?-Patient with recent hospitalization where hewas discharged from here on February 23 for surgical repair of a perforated gastric ulcer and incarcerated incisional hernia, he was discharged on amoxicillin clarithromycin and PPI for H. Pylori ?Infection. ?-Patient was sent by general surgery to ED for evaluation for nausea, vomiting and abdominal pain, patient reports the first 2 days after discharge she had no complaints, but then he started to develop some generalized weakness, fatigue, poor appetite and oral intake, he started to have nausea, and he started to have abdominal pain and vomiting over the last 24 hours, he was seen by his PCP he was sent for further evaluation.  He denies any fever, chills, dysuria, polyuria, hemoptysis or coffee-ground emesis. ? ?Emergency department work-up significant for a creatinine of 1.46, lipase of 982, white blood cell count of 14.5, CT abdomen with no pancreatic or gallbladder findings, but significant for SBO and left lower abdomen. ? ?Assessment and Plan: ?* Acute pancreatitis ?- Admission above most likely in the setting of clarithromycin usage ?-Offending agent has been discontinued ?-Continue IV fluids, analgesics and antiemetics as needed. ?-Follow electrolytes trend and clinical response. ? ?Abdominal pain ?-In the setting of acute pancreatitis and SBO ?-Continue Conservatory management with bowel rest, NG tube, fluid resuscitation, electrolyte repletion and as needed  antiemetics/analgesics. ?-Patient reports feeling better today. ?-Continue to follow lipase trend. ?-Pancreatitis most likely due to clarithromycin usage.  CT scan reassuring. ? ?H. pylori infection ?- Complete treatment with the use of ampicillin, Levaquin and PPI. ?-Clarithromycin has been discontinued secondary to acute pancreatitis development. ? ?Nausea and vomiting ?- Secondary to acute pancreatitis and SBO ?-Continue treatment as mentioned above with conservative management, fluid resuscitation, electrolyte repletion, as needed antiemetics and analgesics. ?-NG tube in place and n.p.o. status. ? ?History of renal pelvis cancer ?- Left renal pelvis cancer/bladder cancer status post left robotic nephro or hepatectomy for large volume.  Negative margins were achieved. ?-Continue outpatient follow-up with urology/oncology service. ? ?CKD (chronic kidney disease) stage 3, GFR 30-59 ml/min (HCC) ?-Overall stable and at baseline. ?-Continue to avoid nephrotoxic agents and adjust medications for his renal function ?-Continue to follow renal function trend/stability. ? ?Smoker ?- Cessation counseling provided ?-Continue the use of nicotine patch. ? ?Hyperlipidemia ?- Planning to resume the use of his statins and heart healthy diet when able to tolerate p.o.'s. ?-Currently with NG tube in place and avoiding oral agents. ? ? ? ?Subjective:  ?Febrile, no chest pain, no nausea, no vomiting.  Hemodynamically stable and without overnight events.  Continue symptomatic/conservative management.  Still with increased NG tube output. ? ?Physical Exam: ?Vitals:  ? 09/06/21 1952 09/07/21 0000 09/07/21 0417 09/07/21 1347  ?BP: (!) 135/91 126/82 140/90 127/78  ?Pulse: 92 87 87 74  ?Resp: 18 18 18 16   ?Temp: 98.3 ?F (36.8 ?C)   99.2 ?F (37.3 ?C)  ?TempSrc: Oral   Oral  ?SpO2: 98% 97% 95% 97%  ? ?General exam: Alert, awake, oriented x 3; overall feeling better.  Still having some  abdominal discomfort.  NG tube in place. ?Respiratory  system: Clear to auscultation. Respiratory effort normal.  No requiring oxygen supplementation. ?Cardiovascular system:RRR. No murmurs, rubs, gallops.  No JVD. ?Gastrointestinal system: Abdomen is nondistended, soft and mildly tender to palpation in his left lower quadrant. No organomegaly or masses felt. Normal bowel sounds heard.  Mid abdomen incision from recent surgery healing appropriately and not demonstrating signs of infection. ?Central nervous system: Alert and oriented. No focal neurological deficits. ?Extremities: No cyanosis or clubbing. ?Skin: No petechiae. ?Psychiatry: Judgement and insight appear normal. Mood & affect appropriate.  ? ? ?Data Reviewed: ?Lipase trending down (982>> 799) ?WBC is down to 11,000; normal hemoglobin and a stable platelet count. ?Complete metabolic panel demonstrating a stable electrolytes; creatinine 1.24 (baseline for patient).  Normal anion gap. ? ?Family Communication: Wife at bedside. ? ?Disposition: ?Status is: Inpatient ?Remains inpatient appropriate because: Waiting resolution of patient SBO, discontinuation of NG tube and availability to tolerate oral intake.  Will follow recommendations by general surgery. ? ? ? Planned Discharge Destination: Home ? ? ?Author: ?Barton Dubois, MD ?09/07/2021 6:30 PM ? ?For on call review [email or web address].  ? ?

## 2021-09-07 NOTE — Assessment & Plan Note (Addendum)
-  Most likely in the setting of clarithromycin usage. ?-Offending agent has been discontinued. ?-Patient advised to keep himself well-hydrated, continue as needed antiemetics and analgesics. ?-Lipase level trended down appropriately and he was not experiencing any pain, nausea or vomiting at discharge. ? ?

## 2021-09-07 NOTE — Assessment & Plan Note (Addendum)
-  In the setting of acute pancreatitis and SBO ?-Excellent response to conservative management: Rest, NG tube, fluid resuscitation, electrolyte repletion as needed antiemetics and analgesics. ?-At discharge no complaining of abdominal pain, no nausea, no vomiting, moving his bowels and tolerating diet. ?-Patient will follow-up with general surgery in PCP as an outpatient ? ? ?

## 2021-09-07 NOTE — Consult Note (Signed)
[place]  Reason for Consult: Readmission postop, SBO Referring Physician: Evalee Jefferson, PA-C  Chief Complaint   Decreased Appetite; Weakness     HPI: Michael Brady is a 66 y.o. male who complains of nausea, vomiting, and decreased appetite since discharge from the hospital.  He denies the symptoms while inpatient, but once he came home, he was having decreased appetite.  When he went long periods of time without eating, per his wife he would get nauseous and throw up.  She also states that he has not been eating a whole lot.  He was seen in office by Dr. Lacinda Axon, who sent him to the hospital for evaluation for dehydration.  In the ED, he was noted to have mild leukocytosis of 14.5 and a lipase of 982.  CT abdomen and pelvis to evaluate for pancreatitis demonstrated concern for small bowel obstruction with a transition point in the left anterior abdomen.  NG tube was placed, and patient was admitted to the hospital.  Upon my evaluation this morning, the patient states that he is not having significant abdominal pain.  He last had a bowel movement 2 days ago, but states he was passing flatus yesterday.  He currently denies nausea and vomiting.  NG tube is in place with 500 cc of bilious output in the canister.  He did have 3 episodes of emesis in the hospital prior to NG tube placement, and NG tube had a documented 800 cc out overnight.  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 I JUST HAD MY BLOOD DRAWN AT [facility] THIS LAST MONDAY BUT THEY Atlantic ME ABOUT IT YET"   History of closed dislocation of shoulder 06/2014   LEFT --- POST IV SEDATION CLOSED REDUCTION IN ED   PONV (postoperative nausea and vomiting)    Renal mass,  left    LEFT RENAL COLLECTING SYSTEM MASS   Smokers' cough ([place])    [place]   Wears dentures    Wears glasses     Past Surgical History:  Procedure Laterality Date   CARPAL TUNNEL RELEASE Left 01-25-2006   dr [facility]   and LEFT THUMB PULLEY RELEASE   COLONOSCOPY N/A 05/07/2017   Procedure: COLONOSCOPY;  Surgeon: Aviva Signs, MD;  [facility];  Service: Gastroenterology;  Laterality: N/A;   CYSTOSCOPY W/ RETROGRADES Right 07/25/2018   Procedure: CYSTOSCOPY WITH RETROGRADE PYELOGRAM;  Surgeon: Alexis Frock, MD;  Location: [facility];  Service: Urology;  Laterality: Right;   CYSTOSCOPY WITH URETEROSCOPY AND STENT PLACEMENT Left 07/29/2017   Procedure: CYSTOSCOPY WITH URETEROSCOPY AND STENT PLACEMENT;  Surgeon: [name], MD;  Location: [facility];  Service: Urology;  Laterality: Left;   CYSTOSCOPY/URETEROSCOPY/HOLMIUM LASER/STENT PLACEMENT Left 07/15/2017   Procedure: CYSTOSCOPY, ATTEMTED URETEROSCOPY/,STENT PLACEMENT;  Surgeon: [name], MD;  Location: [facility];  Service: Urology;  Laterality: Left;   CYSTOSCOPY/URETEROSCOPY/HOLMIUM LASER/STENT PLACEMENT Left 10/21/2017   Procedure: CYSTOSCOPY, RETROGRADE /URETEROSCOPY LEFT UPPER TRACT,BIOPSY, [name] LASER/STENT PLACEMENT;  Surgeon: [name], MD;  Location: [facility];  Service: Urology;  Laterality: Left;   GASTRORRHAPHY N/A 08/26/2021   Procedure: GASTRORRHAPHY;  Surgeon: Rusty Aus, DO;  Location: [facility];  Service: General;  Laterality: N/A;   HOLMIUM LASER APPLICATION Left 11/16/2583   Procedure: HOLMIUM LASER APPLICATION;  Surgeon: [name], MD;  Location: [facility];  Service: Urology;  Laterality: Left;   INCISIONAL HERNIA REPAIR Right 08/26/2021   Procedure: HERNIA REPAIR INCISIONAL;  Surgeon: Rusty Aus, DO;  Location: [facility];  Service: General;  Laterality: Right;    LAPAROTOMY N/A 08/26/2021   Procedure: EXPLORATORY LAPAROTOMY;  Surgeon: Rusty Aus, DO;  Location: [facility];  Service: General;  Laterality: N/A;   ROBOT ASSITED LAPAROSCOPIC NEPHROURETERECTOMY Left 12/11/2017   Procedure: XI ROBOT ASSITED LAPAROSCOPIC NEPHROURETERECTOMY;  Surgeon: Alexis Frock, MD;  Location: [facility];  Service: Urology;  Laterality: Left;   SHOULDER SURGERY Right 1997   ROTATOR CUFF REPAIR    THULIUM LASER TURP (TRANSURETHRAL RESECTION OF PROSTATE) Left 07/29/2017   Procedure: CYSTOSCOPY, LEFT URETEROSCOPY WITH TUMOR BIOPSYTHULLIUM LASER ABLATION OF TUMOR AND LEFT URETERAL STENT EXCHANGE;  Surgeon: [name], MD;  Location: [facility];  Service: Urology;  Laterality: Left;   TRANSURETHRAL RESECTION OF BLADDER TUMOR N/A 07/25/2018   Procedure: TRANSURETHRAL RESECTION OF BLADDER TUMOR (TURBT);  Surgeon: Alexis Frock, MD;  Location: [facility];  Service: Urology;  Laterality: N/A;   WISDOM TOOTH EXTRACTION      Family History  Problem Relation Age of Onset   Colon cancer Neg Hx     Social History   Tobacco Use   Smoking status: Every Day    Packs/day: 1.50    Years: 40.00    Pack years: 60.00    Types: Cigarettes   Smokeless tobacco: Never  Vaping Use   Vaping Use: Never used  Substance Use Topics   Alcohol use: No   Drug use: No    Medications: I have reviewed the patient's current medications.  No Known Allergies   ROS:  Constitutional: negative for chills, fatigue, and fevers Respiratory: negative for cough and shortness of breath Cardiovascular: negative for chest pain and palpitations Gastrointestinal: positive for nausea, negative for abdominal pain and vomiting Musculoskeletal:negative for back pain and neck pain  Blood pressure 140/90, pulse 87, temperature 98.3 F (36.8 C), temperature source Oral, resp. rate 18, SpO2 95 %. Physical Exam Vitals reviewed.  Constitutional:      General: He  is not in acute distress.    Appearance: Normal appearance.  HENT:     Head: Normocephalic and atraumatic.     Nose:     Comments: NG tube in place Eyes:     Extraocular Movements: Extraocular movements intact.     Pupils: Pupils are equal, round, and reactive to light.  Cardiovascular:     Rate and Rhythm: Normal rate.  Pulmonary:     Effort: Pulmonary effort is normal.  Abdominal:     Comments: Abdomen soft, nondistended, no percussion tenderness, minimal tenderness to palpation, no rigidity, guarding, rebound tenderness; midline incision healing well with skin staples in place  Musculoskeletal:        General: Normal range of motion.     Cervical back: Normal range of motion.  Skin:    General: Skin is warm and dry.  Neurological:     General: No focal deficit present.     Mental Status: He is alert and oriented to person, place, and time.  Psychiatric:        Mood and Affect: Mood normal.  Behavior: Behavior normal.    Results: Results for orders placed or performed during the hospital encounter of 09/06/21 (from the past 48 hour(s))  CBC with Differential/Platelet     Status: Abnormal   Collection Time: 09/06/21  3:13 PM  Result Value Ref Range   WBC 14.5 (H) 4.0 - 10.5 K/uL   RBC 4.61 4.22 - 5.81 MIL/uL   Hemoglobin 14.1 13.0 - 17.0 g/dL   HCT 43.7 39.0 - 52.0 %   MCV 94.8 80.0 - 100.0 fL   MCH 30.6 26.0 - 34.0 pg   MCHC 32.3 30.0 - 36.0 g/dL   RDW 12.8 11.5 - 15.5 %   Platelets 580 (H) 150 - 400 K/uL   nRBC 0.0 0.0 - 0.2 %   Neutrophils Relative % 74 %   Neutro Abs 10.7 (H) 1.7 - 7.7 K/uL   Lymphocytes Relative 16 %   Lymphs Abs 2.3 0.7 - 4.0 K/uL   Monocytes Relative 9 %   Monocytes Absolute 1.3 (H) 0.1 - 1.0 K/uL   Eosinophils Relative 1 %   Eosinophils Absolute 0.1 0.0 - 0.5 K/uL   Basophils Relative 0 %   Basophils Absolute 0.1 0.0 - 0.1 K/uL   Immature Granulocytes 0 %   Abs Immature Granulocytes 0.06 0.00 - 0.07 K/uL    Comment: Performed at  [facility], [address], [place], [place] [address]  Comprehensive metabolic panel     Status: Abnormal   Collection Time: 09/06/21  3:13 PM  Result Value Ref Range   Sodium 136 135 - 145 mmol/L   Potassium 4.6 3.5 - 5.1 mmol/L   Chloride 92 (L) 98 - 111 mmol/L   CO2 32 22 - 32 mmol/L   Glucose, Bld 108 (H) 70 - 99 mg/dL    Comment: Glucose reference range applies only to samples taken after fasting for at least 8 hours.   BUN 30 (H) 8 - 23 mg/dL   Creatinine, Ser 1.46 (H) 0.61 - 1.24 mg/dL   Calcium 9.6 8.9 - 10.3 mg/dL   Total Protein 7.9 6.5 - 8.1 g/dL   Albumin 3.7 3.5 - 5.0 g/dL   AST 19 15 - 41 U/L   ALT 19 0 - 44 U/L   Alkaline Phosphatase 87 38 - 126 U/L   Total Bilirubin 0.4 0.3 - 1.2 mg/dL   GFR, Estimated 54 (L) >60 mL/min    Comment: (NOTE) Calculated using the CKD-EPI Creatinine Equation (2021)    Anion gap 12 5 - 15    Comment: Performed at [facility], [address] [place],  [address]  Lipase, blood     Status: Abnormal   Collection Time: 09/06/21  3:13 PM  Result Value Ref Range   Lipase 982 (H) 11 - 51 U/L    Comment: RESULTS CONFIRMED BY MANUAL DILUTION Performed at [facility], [address] [place],  [address]   Urinalysis, Routine w reflex microscopic Urine, Clean Catch     Status: Abnormal   Collection Time: 09/06/21  5:15 PM  Result Value Ref Range   Color, Urine YELLOW YELLOW   APPearance CLEAR CLEAR   Specific Gravity, Urine 1.030 1.005 - 1.030   pH 5.0 5.0 - 8.0   Glucose, UA NEGATIVE NEGATIVE mg/dL   Hgb urine dipstick MODERATE (A) NEGATIVE   Bilirubin Urine NEGATIVE NEGATIVE   Ketones, ur NEGATIVE NEGATIVE mg/dL   Protein, ur 100 (A) NEGATIVE mg/dL   Nitrite NEGATIVE NEGATIVE   Leukocytes,Ua NEGATIVE NEGATIVE  RBC / HPF 21-50 0 - 5 RBC/hpf   WBC, UA 6-10 0 - 5 WBC/hpf   Bacteria, UA RARE (A) NONE SEEN   Squamous Epithelial / LPF 0-5 0 - 5   Mucus PRESENT    Hyaline Casts, UA PRESENT     Comment: Performed at [facility], [address] [place], [place] [address]  Resp Panel by RT-PCR (Flu A&B, Covid) Nasopharyngeal Swab     Status: None   Collection Time: 09/06/21  5:50 PM   Specimen: Nasopharyngeal Swab; Nasopharyngeal(NP) swabs in vial transport medium  Result Value Ref Range   SARS Coronavirus 2 by RT PCR NEGATIVE NEGATIVE    Comment: (NOTE) SARS-CoV-2 target nucleic acids are NOT DETECTED.  The SARS-CoV-2 RNA is generally detectable in upper respiratory specimens during the acute phase of infection. The lowest concentration of SARS-CoV-2 viral copies this assay can detect is 138 copies/mL. A negative result does not preclude SARS-Cov-2 infection and should not be used as the sole basis for treatment or other patient management decisions. A negative result may occur with  improper specimen collection/handling, submission of specimen other than nasopharyngeal swab, presence of viral mutation(s) within the areas targeted by this assay, and inadequate number of viral copies(<138 copies/mL). A negative result must be combined with clinical observations, patient history, and epidemiological information. The expected result is Negative.  Fact Sheet for Patients:  EntrepreneurPulse.com.au  Fact Sheet for Healthcare Providers:  IncredibleEmployment.be  This test is no t yet approved or cleared by the [place] FDA and  has been authorized for detection and/or diagnosis of SARS-CoV-2 by FDA under an Emergency Use Authorization (EUA). This EUA will remain  in effect (meaning this test can be used) for the duration of the COVID-19 declaration under Section 564(b)(1) of the Act, 21 U.S.C.section 360bbb-3(b)(1), unless the authorization is terminated  or revoked sooner.       Influenza A by PCR NEGATIVE NEGATIVE   Influenza B by PCR NEGATIVE NEGATIVE    Comment: (NOTE) The Xpert Xpress SARS-CoV-2/FLU/RSV plus assay is intended as an aid in the diagnosis  of influenza from Nasopharyngeal swab specimens and should not be used as a sole basis for treatment. Nasal washings and aspirates are unacceptable for Xpert Xpress SARS-CoV-2/FLU/RSV testing.  Fact Sheet for Patients: EntrepreneurPulse.com.au  Fact Sheet for Healthcare Providers: IncredibleEmployment.be  This test is not yet approved or cleared by the [place] FDA and has been authorized for detection and/or diagnosis of SARS-CoV-2 by FDA under an Emergency Use Authorization (EUA). This EUA will remain in effect (meaning this test can be used) for the duration of the COVID-19 declaration under Section 564(b)(1) of the Act, 21 U.S.C. section 360bbb-3(b)(1), unless the authorization is terminated or revoked.  Performed at [facility], [address], [place], [place] [address]   Comprehensive metabolic panel     Status: Abnormal   Collection Time: 09/07/21  5:56 AM  Result Value Ref Range   Sodium 137 135 - 145 mmol/L   Potassium 4.6 3.5 - 5.1 mmol/L   Chloride 100 98 - 111 mmol/L   CO2 29 22 - 32 mmol/L   Glucose, Bld 105 (H) 70 - 99 mg/dL    Comment: Glucose reference range applies only to samples taken after fasting for at least 8 hours.   BUN 27 (H) 8 - 23 mg/dL   Creatinine, Ser 1.24 0.61 - 1.24 mg/dL   Calcium 8.8 (L) 8.9 - 10.3 mg/dL   Total Protein 6.6 6.5 -  8.1 g/dL   Albumin 2.9 (L) 3.5 - 5.0 g/dL   AST 16 15 - 41 U/L   ALT 15 0 - 44 U/L   Alkaline Phosphatase 74 38 - 126 U/L   Total Bilirubin 0.4 0.3 - 1.2 mg/dL   GFR, Estimated >60 >60 mL/min    Comment: (NOTE) Calculated using the CKD-EPI Creatinine Equation (2021)    Anion gap 8 5 - 15    Comment: Performed at [facility], [address] [place], [place] [address]  CBC     Status: Abnormal   Collection Time: 09/07/21  5:56 AM  Result Value Ref Range   WBC 11.0 (H) 4.0 - 10.5 K/uL   RBC 4.06 (L) 4.22 - 5.81 MIL/uL   Hemoglobin 12.3 (L) 13.0 - 17.0 g/dL   HCT 38.7  (L) 39.0 - 52.0 %   MCV 95.3 80.0 - 100.0 fL   MCH 30.3 26.0 - 34.0 pg   MCHC 31.8 30.0 - 36.0 g/dL   RDW 12.8 11.5 - 15.5 %   Platelets 461 (H) 150 - 400 K/uL   nRBC 0.0 0.0 - 0.2 %    Comment: Performed at [facility], [address] [place], [place] [address]  Lipase, blood     Status: Abnormal   Collection Time: 09/07/21  5:56 AM  Result Value Ref Range   Lipase 799 (H) 11 - 51 U/L    Comment: RESULTS CONFIRMED BY MANUAL DILUTION Performed at [facility], [address], [place], [place] [address]     CT ABDOMEN PELVIS WO CONTRAST  Result Date: 09/06/2021 CLINICAL DATA:  Left upper quadrant abdominal pain. EXAM: CT ABDOMEN AND PELVIS WITHOUT CONTRAST TECHNIQUE: Multidetector CT imaging of the abdomen and pelvis was performed following the standard protocol without IV contrast. RADIATION DOSE REDUCTION: This exam was performed according to the departmental dose-optimization program which includes automated exposure control, adjustment of the mA and/or kV according to patient size and/or use of iterative reconstruction technique. COMPARISON:  CT abdomen pelvis dated 08/26/2021. FINDINGS: Evaluation of this exam is limited in the absence of intravenous contrast. Lower chest: The visualized lung bases are clear. There is mild elevation of the left hemidiaphragm. There is coronary vascular calcification. Trace free fluid adjacent to the liver and a small focus of air under the right hemidiaphragm, likely postoperative. Hepatobiliary: No focal liver abnormality is seen. No gallstones, gallbladder wall thickening, or biliary dilatation. Pancreas: Unremarkable. No pancreatic ductal dilatation or surrounding inflammatory changes. Spleen: Normal in size without focal abnormality. Adrenals/Urinary Tract: The adrenal glands are unremarkable. Status post prior left nephrectomy. The right kidney, right ureter, and urinary bladder appear unremarkable. Stomach/Bowel: Evaluation of the bowel is limited in the  absence of oral contrast. There multiple fluid-filled and dilated loops of small bowel in the upper and mid abdomen measuring up to 5.5 cm in caliber. The distal small bowel and terminal ileum are collapsed. A transition is suspected in the left anterior abdomen (coronal 30/5 and axial 41/2). There is severe sigmoid diverticulosis. Oral contrast from prior study noted throughout the colon. The appendix is normal. Vascular/Lymphatic: Advanced aortoiliac atherosclerotic disease. There is a 3 cm infrarenal abdominal aortic aneurysm. The IVC is unremarkable. No portal venous gas. There is no adenopathy. Reproductive: The prostate and seminal vesicles are grossly unremarkable. No pelvic mass. Other: Midline vertical anterior abdominal wall surgical incision and cutaneous staples. No fluid collection. Musculoskeletal: Osteopenia with degenerative changes of the spine. No acute osseous pathology. IMPRESSION: 1. Small-bowel obstruction  with transition suspected in the left anterior abdomen. 2. Severe sigmoid diverticulosis. 3. Status post prior left nephrectomy. 4. A 3 cm infrarenal abdominal aortic aneurysm. Recommend follow-up ultrasound every 3 years. This recommendation follows ACR consensus guidelines: White Paper of the ACR Incidental Findings Committee II on Vascular Findings. J Am Coll Radiol 2013; 99:357-017. 5. Aortic Atherosclerosis (ICD10-I70.0). Electronically Signed   By: Anner Crete M.D.   On: 09/06/2021 18:17   DG CHEST PORT 1 VIEW  Result Date: 09/06/2021 CLINICAL DATA:  Nasogastric tube placement EXAM: PORTABLE CHEST 1 VIEW COMPARISON:  None. FINDINGS: Nasogastric tube is in place with its proximal side hole within the distal esophagus and its tip just beyond the gastroesophageal junction. Dilated loops of small bowel are identified within the visualized upper abdomen. Lungs are clear. No pneumothorax or pleural effusion. Cardiac size within normal limits. Degenerative changes are noted within the  right shoulder, particular involving the greater tuberosity with intra-articular loose body noted IMPRESSION: Nasogastric tube just beyond the gastroesophageal junction with the proximal side hole within the distal esophagus. Advancement of the catheter by 15-20 cm would more optimally position the catheter within the gastric lumen. Electronically Signed   By: Fidela Salisbury M.D.   On: 09/06/2021 20:59     Assessment & Plan:  AVEION NGUYEN is a 66 y.o. male who was admitted with concern for small bowel obstruction, dehydration, and possible pancreatitis.  CT abdomen and pelvis demonstrated concern for small bowel obstruction with transition point in the left anterior abdomen.  -Patient hemodynamically stable with improvement of leukocytosis to 11 from 14.5, Cr improved to 1.24 from 1.46 -NG tube to LIS -NPO -PRN pain medications and antiemetics -I explained to the patient and his wife that his CT findings are most likely the explanation for his nausea and vomiting and decreased appetite at home.  I further explained that we will attempt conservative management of his bowel obstruction with NG tube and bowel rest, especially given that he is 12 days out from surgery.  I explained that his recent gastric perforation is likely not the cause of the adhesive disease that is resulting in this current bowel obstruction. -Will monitor patient's NG tube output, and consider small bowel obstruction protocol once NG tube output has decreased -Continue to monitor for bowel function -Will continue treatment of H. pylori with IV Levaquin, ampicillin, and PPI -Appreciate hospitalist recommendations  All questions were answered to the satisfaction of the patient and family.  -- Graciella Freer, DO [facility] [address] [place], [place] [address] [phone] (office)

## 2021-09-07 NOTE — Assessment & Plan Note (Addendum)
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  Negative margins were achieved. ?-Continue outpatient follow-up with urology/oncology service. ?

## 2021-09-07 NOTE — Progress Notes (Signed)
?  Transition of Care (TOC) Screening Note ? ? ?Patient Details  ?Name: Michael Brady ?Date of Birth: [birth year] ? ? ?Transition of Care (TOC) CM/SW Contact:    ?Boneta Lucks, RN ?Phone Number: ?09/07/2021, 12:58 PM ? ? ? ?Transition of Care Department [facility]) has reviewed patient and no TOC needs have been identified at this time. We will continue to monitor patient advancement through interdisciplinary progression rounds. If new patient transition needs arise, please place a TOC consult. ? ?D/C planning for 3/5? ? ? ?

## 2021-09-07 NOTE — Assessment & Plan Note (Addendum)
-  Cessation counseling provided ?-Continue the use of nicotine patch. ?-patient contemplating quitting. ?

## 2021-09-07 NOTE — Assessment & Plan Note (Addendum)
-  Overall stable and at baseline currently. ?-Continue to avoid nephrotoxic agents and maintain adequate hydration. ?-Repeat basic metabolic panel follow-up visit to assess renal function trend/stability. ? ?

## 2021-09-08 ENCOUNTER — Encounter (HOSPITAL_COMMUNITY): Payer: Self-pay | Admitting: Internal Medicine

## 2021-09-08 LAB — BASIC METABOLIC PANEL
Anion gap: 8 (ref 5–15)
BUN: 23 mg/dL (ref 8–23)
CO2: 25 mmol/L (ref 22–32)
Calcium: 8.6 mg/dL — ABNORMAL LOW (ref 8.9–10.3)
Chloride: 105 mmol/L (ref 98–111)
Creatinine, Ser: 1.15 mg/dL (ref 0.61–1.24)
GFR, Estimated: >60 mL/min (ref >60)
Glucose, Bld: 82 mg/dL (ref 70–99)
Potassium: 5.7 mmol/L — ABNORMAL HIGH (ref 3.5–5.1)
Sodium: 138 mmol/L (ref 135–145)

## 2021-09-08 LAB — LIPASE, BLOOD: Lipase: 571 U/L — ABNORMAL HIGH (ref 11–51)

## 2021-09-08 MED ORDER — LEVOFLOXACIN IN D5W 500 MG/100ML IV SOLN
500.0000 mg | INTRAVENOUS | Status: DC
  Administered 2021-09-08 – 2021-09-09 (×2): 500 mg via INTRAVENOUS
  Filled 2021-09-08 (×2): qty 100

## 2021-09-08 NOTE — Progress Notes (Signed)
[facility] Progress Note ? ?   ?Subjective: ?Patient seen and examined.  He is sitting in a chair at the side of the bed.  He does have some abdominal pain, but denies nausea and vomiting.  He does confirm passing a small amount of flatus last night and this morning.  No bowel movements.  NG tube with 1.1 L of bilious drainage.  Good urine output documented. ? ?Objective: ?Vital signs in last 24 hours: ?Temp:  [97.4 ?F (36.3 ?C)-99.2 ?F (37.3 ?C)] 97.4 ?F (36.3 ?C) (03/03 1244) ?Pulse Rate:  [66-77] 66 (03/03 1244) ?Resp:  [16-18] 18 (03/03 1244) ?BP: (127-156)/(78-85) 156/81 (03/03 1244) ?SpO2:  [94 %-97 %] 97 % (03/03 1244) ?Weight:  [65.1 kg] 65.1 kg (03/03 1100) ?Last BM Date : 09/05/21 ? ?Intake/Output from previous day: ?03/02 0701 - 03/03 0700 ?In: 3743.2 [I.V.:3193.1; IV Piggyback:550.1] ?Out: 2300 [Urine:1200; Emesis/NG output:1100] ?Intake/Output this shift: ?Total I/O ?In: 625 [I.V.:625] ?Out: -  ? ?General appearance: alert, cooperative, and no distress ?Nose: NG tube in place ?GI: Soft, nondistended, no percussion tenderness, minimal tenderness to palpation, midline incision healing well with skin staples in place; no rigidity, guarding, rebound tenderness ? ?Lab Results:  ?Recent Labs  ?  09/06/21 ?1513 09/07/21 ?1093  ?WBC 14.5* 11.0*  ?HGB 14.1 12.3*  ?HCT 43.7 38.7*  ?PLT 580* 461*  ? ?BMET ?Recent Labs  ?  09/07/21 ?2355 09/08/21 ?7322  ?NA 137 138  ?K 4.6 5.7*  ?CL 100 105  ?CO2 29 25  ?GLUCOSE 105* 82  ?BUN 27* 23  ?CREATININE 1.24 1.15  ?CALCIUM 8.8* 8.6*  ? ?PT/INR ?No results for input(s): LABPROT, INR in the last 72 hours. ? ?Studies/Results: ?CT ABDOMEN PELVIS WO CONTRAST ? ?Result Date: 09/06/2021 ?CLINICAL DATA:  Left upper quadrant abdominal pain. EXAM: CT ABDOMEN AND PELVIS WITHOUT CONTRAST TECHNIQUE: Multidetector CT imaging of the abdomen and pelvis was performed following the standard protocol without IV contrast. RADIATION DOSE REDUCTION: This exam was performed  according to the departmental dose-optimization program which includes automated exposure control, adjustment of the mA and/or kV according to patient size and/or use of iterative reconstruction technique. COMPARISON:  CT abdomen pelvis dated 08/26/2021. FINDINGS: Evaluation of this exam is limited in the absence of intravenous contrast. Lower chest: The visualized lung bases are clear. There is mild elevation of the left hemidiaphragm. There is coronary vascular calcification. Trace free fluid adjacent to the liver and a small focus of air under the right hemidiaphragm, likely postoperative. Hepatobiliary: No focal liver abnormality is seen. No gallstones, gallbladder wall thickening, or biliary dilatation. Pancreas: Unremarkable. No pancreatic ductal dilatation or surrounding inflammatory changes. Spleen: Normal in size without focal abnormality. Adrenals/Urinary Tract: The adrenal glands are unremarkable. Status post prior left nephrectomy. The right kidney, right ureter, and urinary bladder appear unremarkable. Stomach/Bowel: Evaluation of the bowel is limited in the absence of oral contrast. There multiple fluid-filled and dilated loops of small bowel in the upper and mid abdomen measuring up to 5.5 cm in caliber. The distal small bowel and terminal ileum are collapsed. A transition is suspected in the left anterior abdomen (coronal 30/5 and axial 41/2). There is severe sigmoid diverticulosis. Oral contrast from prior study noted throughout the colon. The appendix is normal. Vascular/Lymphatic: Advanced aortoiliac atherosclerotic disease. There is a 3 cm infrarenal abdominal aortic aneurysm. The IVC is unremarkable. No portal venous gas. There is no adenopathy. Reproductive: The prostate and seminal vesicles are grossly unremarkable. No pelvic mass. Other: Midline vertical  anterior abdominal wall surgical incision and cutaneous staples. No fluid collection. Musculoskeletal: Osteopenia with degenerative changes  of the spine. No acute osseous pathology. IMPRESSION: 1. Small-bowel obstruction with transition suspected in the left anterior abdomen. 2. Severe sigmoid diverticulosis. 3. Status post prior left nephrectomy. 4. A 3 cm infrarenal abdominal aortic aneurysm. Recommend follow-up ultrasound every 3 years. This recommendation follows ACR consensus guidelines: White Paper of the ACR Incidental Findings Committee II on Vascular Findings. J Am Coll Radiol 2013; 28:315-176. 5. Aortic Atherosclerosis (ICD10-I70.0). Electronically Signed   By: Anner Crete M.D.   On: 09/06/2021 18:17  ? ?DG CHEST PORT 1 VIEW ? ?Result Date: 09/06/2021 ?CLINICAL DATA:  Nasogastric tube placement EXAM: PORTABLE CHEST 1 VIEW COMPARISON:  None. FINDINGS: Nasogastric tube is in place with its proximal side hole within the distal esophagus and its tip just beyond the gastroesophageal junction. Dilated loops of small bowel are identified within the visualized upper abdomen. Lungs are clear. No pneumothorax or pleural effusion. Cardiac size within normal limits. Degenerative changes are noted within the right shoulder, particular involving the greater tuberosity with intra-articular loose body noted IMPRESSION: Nasogastric tube just beyond the gastroesophageal junction with the proximal side hole within the distal esophagus. Advancement of the catheter by 15-20 cm would more optimally position the catheter within the gastric lumen. Electronically Signed   By: Fidela Salisbury M.D.   On: 09/06/2021 20:59   ? ?Anti-infectives: ?Anti-infectives (From admission, onward)  ? ? Start     Dose/Rate Route Frequency Ordered Stop  ? 09/07/21 2100  Levofloxacin (LEVAQUIN) IVPB 250 mg       ? 250 mg ?50 mL/hr over 60 Minutes Intravenous Every 24 hours 09/06/21 2141    ? 09/06/21 2230  levofloxacin (LEVAQUIN) IVPB 500 mg       ? 500 mg ?100 mL/hr over 60 Minutes Intravenous  Once 09/06/21 2141 09/07/21 0930  ? 09/06/21 1945  ampicillin (OMNIPEN) 1 g in sodium  chloride 0.9 % 100 mL IVPB       ? 1 g ?300 mL/hr over 20 Minutes Intravenous Every 6 hours 09/06/21 1944    ? 09/06/21 1945  Levofloxacin (LEVAQUIN) IVPB 250 mg  Status:  Discontinued       ? 250 mg ?50 mL/hr over 60 Minutes Intravenous Every 24 hours 09/06/21 1944 09/06/21 2141  ? ?  ? ? ?Assessment/Plan: ? ?Patient is a 66 year old male who was admitted with concern for small bowel obstruction, pancreatitis, and dehydration.  CT abdomen and pelvis demonstrated concern for small bowel obstruction with transition point in the left anterior abdomen. ? ?-Patient hemodynamically stable with no significant complaints at this time ?-Lipase improved to 571 from 799 ?-Cr improving as well, 1.15 from 1.24 ?-Given the amount of NG tube output in the last 24 hours (1.1 L), will maintain NG tube for another day for decompression with plan for small bowel obstruction study tomorrow ?-NG to LIS ?-NPO ?-Continue to monitor for bowel function ?-PRN pain control and antiemetics ?-Continue H. pylori treatment of IV Levaquin, ampicillin, and PPI ?-Appreciate hospitalist recommendations ? ? LOS: 2 days  ? ? ?Michael Brady ?09/08/2021 ? ?

## 2021-09-08 NOTE — Progress Notes (Signed)
?Progress Note ? ? ?Patient: Michael Brady YIR:[id number] DOB: [birth year] DOA: 09/06/2021     2 ?DOS: the patient was seen and examined on 09/08/2021 ?  ?Brief hospital admission narrative course: ?As per H&P written by Dr. Waldron Brady on 09/06/2021 ?Michael Brady  is a 66 y.o. 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 ?-Patient with recent hospitalization where hewas discharged from here on February 23 for surgical repair of a perforated gastric ulcer and incarcerated incisional hernia, he was discharged on amoxicillin clarithromycin and PPI for H. Pylori ?Infection. ?-Patient was sent by general surgery to ED for evaluation for nausea, vomiting and abdominal pain, patient reports the first 2 days after discharge she had no complaints, but then he started to develop some generalized weakness, fatigue, poor appetite and oral intake, he started to have nausea, and he started to have abdominal pain and vomiting over the last 24 hours, he was seen by his PCP he was sent for further evaluation.  He denies any fever, chills, dysuria, polyuria, hemoptysis or coffee-ground emesis. ? ?Emergency department work-up significant for a creatinine of 1.46, lipase of 982, white blood cell count of 14.5, CT abdomen with no pancreatic or gallbladder findings, but significant for SBO and left lower abdomen. ? ?Assessment and Plan: ?* Acute pancreatitis ?- As mentioned above most likely in the setting of clarithromycin usage. ?-Offending agent has been discontinued. ?-Continue IV fluids, analgesics and antiemetics as needed. ?-Follow electrolytes trend and clinical response. ?-Lipase level down to 500 range. ? ?Abdominal pain ?-In the setting of acute pancreatitis and SBO ?-Continue Conservatory management with bowel rest, NG tube, fluid resuscitation, electrolyte  repletion and as needed antiemetics/analgesics. ?-Patient reports continued feeling better and is denying any nausea or vomiting.  Still having some intermittent abdominal discomfort.  Expressed passing gas.  Still without bowel movements. ?-Lipase trending down; will continue to follow trend intermittently. ?-Pancreatitis most likely due to clarithromycin usage.  CT scan reassuring. ? ?H. pylori infection ?-Complete treatment with the use of ampicillin, Levaquin and PPI. ?-Clarithromycin has been discontinued secondary to acute pancreatitis development. ? ?Nausea and vomiting ?-Secondary to acute pancreatitis and SBO ?-Continue treatment as mentioned above with conservative management, fluid resuscitation, electrolyte repletion, as needed antiemetics and analgesics. ?-Continue NG tube for another 24 hours as per general surgery recommendation and will also continue n.p.o. status. ?-Planning for a small bowel follow-through images tomorrow. ? ?History of renal pelvis cancer ?-Left renal pelvis cancer/bladder cancer status post left robotic nephro or hepatectomy for large volume.  Negative margins were achieved. ?-Continue outpatient follow-up with urology/oncology service. ? ?CKD (chronic kidney disease) stage 3, GFR 30-59 ml/min (HCC) ?-Overall stable and at baseline currently. ?-Continue to avoid nephrotoxic agents and adjust medications for his renal function ?-Continue to follow renal function trend/stability intermittently. ? ?Smoker ?-Cessation counseling provided ?-Continue the use of nicotine patch. ? ?Hyperlipidemia ?-Planning to resume the use of his statins and heart healthy diet when able to tolerate p.o.'s. ?-Currently with NG tube in place and avoiding oral agents. ? ? ? ?Subjective:  ?No fever, no chest pain, no nausea, no vomiting.  NG tube still in place.  Patient reports passing gas and just mild intermittent abdominal pain. ? ?Physical Exam: ?Vitals:  ? 09/07/21 2118 09/08/21 0339 09/08/21 1100  09/08/21 1244  ?BP: (!) 147/83 (!) 143/85  Marland Kitchen)  156/81  ?Pulse: 77 72  66  ?Resp: 18 18  18   ?Temp:    (!) 97.4 ?F (36.3 ?C)  ?TempSrc:    Oral  ?SpO2: 94% 95%  97%  ?Weight:   65.1 kg   ?Height:   5\' 9"  (1.753 m)   ? ?General exam: Alert, awake, oriented x 3; reporting intermittent abdominal discomfort for and ability to pass gas.  Still no bowel movement.  Total NG tube output 1.1 L.  Patient is afebrile. ?Respiratory system: Clear to auscultation. Respiratory effort normal.  No requiring oxygen supplementation.  No using accessory muscle. ?Cardiovascular system:RRR. No murmurs, rubs, gallops.  No JVD. ?Gastrointestinal system: Abdomen is nondistended, soft and mildly tender to palpation in his left lower quadrant and around the need section incision from previous surgery.  There is no signs of drainage or erythema.  Wound healing appropriately.  Decreased but present bowel sounds appreciated on exam.  ?Central nervous system: Alert and oriented. No focal neurological deficits. ?Extremities: No cyanosis or clubbing. ?Skin: No r petechiae. ?Psychiatry: Judgement and insight appear normal. Mood & affect appropriate.  ? ? ?Data Reviewed: ?Lipase trending down (982>> 799>>571) ?Basic metabolic panel with isolated hyperkalemic at 5.7 most likely due to hemolysis ?Creatinine 1.15 and normal GFR. ? ?Family Communication: Wife at bedside. ? ?Disposition: ?Status is: Inpatient ?Remains inpatient appropriate because: Waiting resolution of patient SBO, discontinuation of NG tube and availability to tolerate oral intake.  Will follow recommendations by general surgery. ? ? ? Planned Discharge Destination: Home ? ? ?Author: ?Michael Dubois, MD ?09/08/2021 4:30 PM ? ?For on call review [email or web address].  ? ?

## 2021-09-08 NOTE — Progress Notes (Signed)
MEDICATION RELATED CONSULT NOTE -   Pharmacy Consult for H pylori treatment   No Known Allergies   Vital Signs: Temp: 97.4 F (36.3 C) (03/03 1244) Temp Source: Oral (03/03 1244) BP: 156/81 (03/03 1244) Pulse Rate: 66 (03/03 1244)   Labs: Recent Labs    09/06/21 1513 09/07/21 0556 09/08/21 0614  WBC 14.5* 11.0*  --   HGB 14.1 12.3*  --   HCT 43.7 38.7*  --   PLT 580* 461*  --   CREATININE 1.46* 1.24 1.15  ALBUMIN 3.7 2.9*  --   PROT 7.9 6.6  --   AST 19 16  --   ALT 19 15  --   ALKPHOS 87 74  --   BILITOT 0.4 0.4  --     Estimated Creatinine Clearance: 59.8 mL/min (by C-G formula based on SCr of 1.15 mg/dL).   Microbiology: Recent Results (from the past 720 hour(s))  Resp Panel by RT-PCR (Flu A&B, Covid) Nasopharyngeal Swab     Status: None   Collection Time: 08/26/21  6:11 AM   Specimen: Nasopharyngeal Swab; Nasopharyngeal(NP) swabs in vial transport medium  Result Value Ref Range Status   SARS Coronavirus 2 by RT PCR NEGATIVE NEGATIVE Final    Comment: (NOTE) SARS-CoV-2 target nucleic acids are NOT DETECTED.  The SARS-CoV-2 RNA is generally detectable in upper respiratory specimens during the acute phase of infection. The lowest concentration of SARS-CoV-2 viral copies this assay can detect is 138 copies/mL. A negative result does not preclude SARS-Cov-2 infection and should not be used as the sole basis for treatment or other patient management decisions. A negative result may occur with  improper specimen collection/handling, submission of specimen other than nasopharyngeal swab, presence of viral mutation(s) within the areas targeted by this assay, and inadequate number of viral copies(<138 copies/mL). A negative result must be combined with clinical observations, patient history, and epidemiological information. The expected result is Negative.  Fact Sheet for Patients:  EntrepreneurPulse.com.au  Fact Sheet for Healthcare  Providers:  IncredibleEmployment.be  This test is no t yet approved or cleared by the [place] FDA and  has been authorized for detection and/or diagnosis of SARS-CoV-2 by FDA under an Emergency Use Authorization (EUA). This EUA will remain  in effect (meaning this test can be used) for the duration of the COVID-19 declaration under Section 564(b)(1) of the Act, 21 U.S.C.section 360bbb-3(b)(1), unless the authorization is terminated  or revoked sooner.       Influenza A by PCR NEGATIVE NEGATIVE Final   Influenza B by PCR NEGATIVE NEGATIVE Final    Comment: (NOTE) The Xpert Xpress SARS-CoV-2/FLU/RSV plus assay is intended as an aid in the diagnosis of influenza from Nasopharyngeal swab specimens and should not be used as a sole basis for treatment. Nasal washings and aspirates are unacceptable for Xpert Xpress SARS-CoV-2/FLU/RSV testing.  Fact Sheet for Patients: EntrepreneurPulse.com.au  Fact Sheet for Healthcare Providers: IncredibleEmployment.be  This test is not yet approved or cleared by the [place] FDA and has been authorized for detection and/or diagnosis of SARS-CoV-2 by FDA under an Emergency Use Authorization (EUA). This EUA will remain in effect (meaning this test can be used) for the duration of the COVID-19 declaration under Section 564(b)(1) of the Act, 21 U.S.C. section 360bbb-3(b)(1), unless the authorization is terminated or revoked.  Performed at [facility], [address], [place], [place] [address]   Aerobic/Anaerobic Culture w Gram Stain (surgical/deep wound)     Status: None   Collection  Time: 08/26/21  9:27 AM   Specimen: PATH Other; Tissue  Result Value Ref Range Status   Specimen Description   Final    TISSUE Performed at [facility], [address] [place], [place] [address]    Special Requests   Final    PERITONEAL Performed at [facility], [address] [place], [place]  [address]    Gram Stain   Final    RARE WBC PRESENT, PREDOMINANTLY PMN NO ORGANISMS SEEN    Culture   Final    RARE STAPHYLOCOCCUS AUREUS RARE STREPTOCOCCUS MITIS/ORALIS RARE ESCHERICHIA COLI NO ANAEROBES ISOLATED Performed at [facility], [place] [address], [place], [place] [address]    Report Status 08/31/2021 FINAL  Final   Organism ID, Bacteria STAPHYLOCOCCUS AUREUS  Final   Organism ID, Bacteria STREPTOCOCCUS MITIS/ORALIS  Final   Organism ID, Bacteria ESCHERICHIA COLI  Final      Susceptibility   Escherichia coli - MIC*    AMPICILLIN 4 SENSITIVE Sensitive     CEFAZOLIN <=4 SENSITIVE Sensitive     CEFEPIME <=0.12 SENSITIVE Sensitive     CEFTAZIDIME <=1 SENSITIVE Sensitive     CEFTRIAXONE <=0.25 SENSITIVE Sensitive     CIPROFLOXACIN <=0.25 SENSITIVE Sensitive     GENTAMICIN 4 SENSITIVE Sensitive     IMIPENEM 1 SENSITIVE Sensitive     TRIMETH/SULFA <=20 SENSITIVE Sensitive     AMPICILLIN/SULBACTAM <=2 SENSITIVE Sensitive     PIP/TAZO <=4 SENSITIVE Sensitive     * RARE ESCHERICHIA COLI   Staphylococcus aureus - MIC*    CIPROFLOXACIN <=0.5 SENSITIVE Sensitive     ERYTHROMYCIN RESISTANT Resistant     GENTAMICIN <=0.5 SENSITIVE Sensitive     OXACILLIN <=0.25 SENSITIVE Sensitive     TETRACYCLINE <=1 SENSITIVE Sensitive     VANCOMYCIN 1 SENSITIVE Sensitive     TRIMETH/SULFA <=10 SENSITIVE Sensitive     CLINDAMYCIN RESISTANT Resistant     RIFAMPIN <=0.5 SENSITIVE Sensitive     Inducible Clindamycin POSITIVE Resistant     * RARE STAPHYLOCOCCUS AUREUS   Streptococcus mitis/oralis - MIC*    TETRACYCLINE >=16 RESISTANT Resistant     VANCOMYCIN 0.25 SENSITIVE Sensitive     CLINDAMYCIN >=1 RESISTANT Resistant     PENICILLIN Value in next row Intermediate      INTERMEDIATE0.25    CEFTRIAXONE Value in next row Sensitive      SENSITIVE0.5    * RARE STREPTOCOCCUS MITIS/ORALIS  Culture, blood (routine x 2)     Status: None   Collection Time: 08/26/21 12:37 PM   Specimen: BLOOD  RIGHT HAND  Result Value Ref Range Status   Specimen Description   Final    BLOOD RIGHT HAND BOTTLES DRAWN AEROBIC AND ANAEROBIC Performed at [facility], [address], [place], [place] [address]    Special Requests   Final    Blood Culture results may not be optimal due to an excessive volume of blood received in culture bottles Performed at [facility], [address] [place], [place] [address]    Culture  Setup Time   Final    CORRECTED RESULTS NO ORGANISMS SEEN PREVIOUSLY REPORTED AS: GRAM POSITIVE COCCI ANAEROBIC BOTTLE ONLY CORRECTED RESULTS CALLED TOPark Breed [id number] FCP Performed at [facility], [address] [place], [place] [address]    Culture   Final    NO GROWTH 5 DAYS Performed at [facility], [address], [place], [place] [address]    Report Status 08/31/2021  FINAL  Final  Blood Culture ID Panel (Reflexed)     Status: None   Collection Time: 08/26/21 12:37 PM  Result Value Ref Range Status   Enterococcus faecalis NOT DETECTED NOT DETECTED Final   Enterococcus Faecium NOT DETECTED NOT DETECTED Final   Listeria monocytogenes NOT DETECTED NOT DETECTED Final   Staphylococcus species NOT DETECTED NOT DETECTED Final   Staphylococcus aureus (BCID) NOT DETECTED NOT DETECTED Final   Staphylococcus epidermidis NOT DETECTED NOT DETECTED Final   Staphylococcus lugdunensis NOT DETECTED NOT DETECTED Final   Streptococcus species NOT DETECTED NOT DETECTED Final   Streptococcus agalactiae NOT DETECTED NOT DETECTED Final   Streptococcus pneumoniae NOT DETECTED NOT DETECTED Final   Streptococcus pyogenes NOT DETECTED NOT DETECTED Final   A.calcoaceticus-baumannii NOT DETECTED NOT DETECTED Final   Bacteroides fragilis NOT DETECTED NOT DETECTED Final   Enterobacterales NOT DETECTED NOT DETECTED Final   Enterobacter cloacae complex NOT DETECTED NOT DETECTED Final   Escherichia coli NOT DETECTED NOT DETECTED Final   Klebsiella aerogenes NOT DETECTED NOT  DETECTED Final   Klebsiella oxytoca NOT DETECTED NOT DETECTED Final   Klebsiella pneumoniae NOT DETECTED NOT DETECTED Final   Proteus species NOT DETECTED NOT DETECTED Final   Salmonella species NOT DETECTED NOT DETECTED Final   Serratia marcescens NOT DETECTED NOT DETECTED Final   Haemophilus influenzae NOT DETECTED NOT DETECTED Final   Neisseria meningitidis NOT DETECTED NOT DETECTED Final   Pseudomonas aeruginosa NOT DETECTED NOT DETECTED Final   Stenotrophomonas maltophilia NOT DETECTED NOT DETECTED Final   Candida albicans NOT DETECTED NOT DETECTED Final   Candida auris NOT DETECTED NOT DETECTED Final   Candida glabrata NOT DETECTED NOT DETECTED Final   Candida krusei NOT DETECTED NOT DETECTED Final   Candida parapsilosis NOT DETECTED NOT DETECTED Final   Candida tropicalis NOT DETECTED NOT DETECTED Final   Cryptococcus neoformans/gattii NOT DETECTED NOT DETECTED Final    Comment: Performed at [facility], [address], [place], [place] [address]  Culture, blood (routine x 2)     Status: None   Collection Time: 08/26/21 12:49 PM   Specimen: BLOOD LEFT HAND  Result Value Ref Range Status   Specimen Description   Final    BLOOD LEFT HAND BOTTLES DRAWN AEROBIC AND ANAEROBIC   Special Requests   Final    Blood Culture results may not be optimal due to an excessive volume of blood received in culture bottles   Culture   Final    NO GROWTH 5 DAYS Performed at [facility], [address], [place], [place] [address]    Report Status 08/31/2021 FINAL  Final  Resp Panel by RT-PCR (Flu A&B, Covid) Nasopharyngeal Swab     Status: None   Collection Time: 09/06/21  5:50 PM   Specimen: Nasopharyngeal Swab; Nasopharyngeal(NP) swabs in vial transport medium  Result Value Ref Range Status   SARS Coronavirus 2 by RT PCR NEGATIVE NEGATIVE Final    Comment: (NOTE) SARS-CoV-2 target nucleic acids are NOT DETECTED.  The SARS-CoV-2 RNA is generally detectable in upper  respiratory specimens during the acute phase of infection. The lowest concentration of SARS-CoV-2 viral copies this assay can detect is 138 copies/mL. A negative result does not preclude SARS-Cov-2 infection and should not be used as the sole basis for treatment or other patient management decisions. A negative result may occur with  improper specimen collection/handling, submission of specimen other than nasopharyngeal swab, presence of viral mutation(s) within the areas targeted by this  assay, and inadequate number of viral copies(<138 copies/mL). A negative result must be combined with clinical observations, patient history, and epidemiological information. The expected result is Negative.  Fact Sheet for Patients:  EntrepreneurPulse.com.au  Fact Sheet for Healthcare Providers:  IncredibleEmployment.be  This test is no t yet approved or cleared by the [place] FDA and  has been authorized for detection and/or diagnosis of SARS-CoV-2 by FDA under an Emergency Use Authorization (EUA). This EUA will remain  in effect (meaning this test can be used) for the duration of the COVID-19 declaration under Section 564(b)(1) of the Act, 21 U.S.C.section 360bbb-3(b)(1), unless the authorization is terminated  or revoked sooner.       Influenza A by PCR NEGATIVE NEGATIVE Final   Influenza B by PCR NEGATIVE NEGATIVE Final    Comment: (NOTE) The Xpert Xpress SARS-CoV-2/FLU/RSV plus assay is intended as an aid in the diagnosis of influenza from Nasopharyngeal swab specimens and should not be used as a sole basis for treatment. Nasal washings and aspirates are unacceptable for Xpert Xpress SARS-CoV-2/FLU/RSV testing.  Fact Sheet for Patients: EntrepreneurPulse.com.au  Fact Sheet for Healthcare Providers: IncredibleEmployment.be  This test is not yet approved or cleared by the [place] FDA and has been  authorized for detection and/or diagnosis of SARS-CoV-2 by FDA under an Emergency Use Authorization (EUA). This EUA will remain in effect (meaning this test can be used) for the duration of the COVID-19 declaration under Section 564(b)(1) of the Act, 21 U.S.C. section 360bbb-3(b)(1), unless the authorization is terminated or revoked.  Performed at [facility], [address] [place], [place] [address]      Assessment: 66 yo male presented on 09/06/21 with nausea and weakness. Patient is s/p surgical repair of perforated gastric ulcer and incarcerated incisional hernia and sent home on 2 week course of amoxicillin, PPI and clarithromycin for h. Pylori. Concern adverse effects of clarithromycin (dyspepsia) but symptoms now could be due to CT imaging findings. Discussed with Dr. Okey Dupre bismuth quad therapy (bismuth subsalicylate, metronidazole, tetracycline, PPI) versus levofloxacin triple therapy (levofloxacin, amoxicillin, PPI) options. Patient now NPO with CT concerning for SBO and since unable to give bismuth subsalicylate will proceed with levofloxacin, ampicillin, and pantoprazole. CrCl ~47 ml/min.   3/3 renal function improved, will adjust levofloxacin  Plan:  Increase  levofloxacin 500mg  IV q24h  Continue ampicillin 1g q6h  Continue pantoprazole 40mg  IV q12h Follow up ability to change to po regimen Monitor renal function   Isac Sarna, BS [place], [place] Clinical Pharmacist Pager [phone] 09/08/2021 2:18 PM

## 2021-09-09 ENCOUNTER — Inpatient Hospital Stay (HOSPITAL_COMMUNITY): Payer: BC Managed Care – PPO

## 2021-09-09 LAB — BASIC METABOLIC PANEL
Anion gap: 13 (ref 5–15)
BUN: 18 mg/dL (ref 8–23)
CO2: 22 mmol/L (ref 22–32)
Calcium: 8.7 mg/dL — ABNORMAL LOW (ref 8.9–10.3)
Chloride: 102 mmol/L (ref 98–111)
Creatinine, Ser: 1.05 mg/dL (ref 0.61–1.24)
GFR, Estimated: >60 mL/min (ref >60)
Glucose, Bld: 74 mg/dL (ref 70–99)
Potassium: 5.7 mmol/L — ABNORMAL HIGH (ref 3.5–5.1)
Sodium: 137 mmol/L (ref 135–145)

## 2021-09-09 MED ORDER — DIATRIZOATE MEGLUMINE & SODIUM 66-10 % PO SOLN
90.0000 mL | Freq: Once | ORAL | Status: AC
  Filled 2021-09-09: qty 90

## 2021-09-09 MED ORDER — DIATRIZOATE MEGLUMINE & SODIUM 66-10 % PO SOLN
ORAL | Status: AC
  Administered 2021-09-09: 90 mL via NASOGASTRIC
  Filled 2021-09-09: qty 90

## 2021-09-09 NOTE — Progress Notes (Signed)
?Progress Note ? ? ?Patient: Michael Brady JQB:[id number] DOB: July 31, 1955 DOA: 09/06/2021     3 ?DOS: the patient was seen and examined on 09/09/2021 ?  ?Brief hospital admission narrative course: ?As per H&P written by Dr. Waldron Labs on 09/06/2021 ?Michael Brady  is a 66 y.o. 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 ?-Patient with recent hospitalization where hewas discharged from here on February 23 for surgical repair of a perforated gastric ulcer and incarcerated incisional hernia, he was discharged on amoxicillin clarithromycin and PPI for H. Pylori ?Infection. ?-Patient was sent by general surgery to ED for evaluation for nausea, vomiting and abdominal pain, patient reports the first 2 days after discharge she had no complaints, but then he started to develop some generalized weakness, fatigue, poor appetite and oral intake, he started to have nausea, and he started to have abdominal pain and vomiting over the last 24 hours, he was seen by his PCP he was sent for further evaluation.  He denies any fever, chills, dysuria, polyuria, hemoptysis or coffee-ground emesis. ? ?Emergency department work-up significant for a creatinine of 1.46, lipase of 982, white blood cell count of 14.5, CT abdomen with no pancreatic or gallbladder findings, but significant for SBO and left lower abdomen. ? ?Assessment and Plan: ?* Acute pancreatitis ?- As mentioned above most likely in the setting of clarithromycin usage. ?-Offending agent has been discontinued. ?-Continue IV fluids, analgesics and antiemetics as needed. ?-Follow electrolytes trend and clinical response. ?-Lipase level will be rechecked once again tomorrow morning. ?-Patient continue demonstrating clinical improvement. ? ?Abdominal pain ?-In the setting of acute pancreatitis and SBO ?-Continue  Conservatory management with bowel rest, fluid resuscitation, electrolyte repletion and as needed antiemetics/analgesics. ?-Patient reports continued feeling better and is denying any nausea or vomiting.  Still having some intermittent abdominal discomfort.  Expressed passing gas.  Still without bowel movements. ?-Lipase trending down; will continue to follow trend intermittently. ?-Pancreatitis most likely due to clarithromycin usage.  CT scan reassuring. ?-Planning for a small bowel follow-through and NG tube clamping trial. ? ?H. pylori infection ?-Complete treatment with the use of ampicillin, Levaquin and PPI. ?-Clarithromycin has been discontinued secondary to acute pancreatitis development. ? ?Nausea and vomiting ?-Secondary to acute pancreatitis and SBO ?-Continue treatment as mentioned above with conservative management, fluid resuscitation, electrolyte repletion, as needed antiemetics and analgesics. ?-As mentioned above the plan is for small bowel follow-through images and NG tube clamping trial. ?-Continue to follow general surgery recommendations. ? ? ?History of renal pelvis cancer ?-Left renal pelvis cancer/bladder cancer status post left robotic nephro or hepatectomy for large volume.  Negative margins were achieved. ?-Continue outpatient follow-up with urology/oncology service. ? ?CKD (chronic kidney disease) stage 3, GFR 30-59 ml/min (HCC) ?-Overall stable and at baseline currently. ?-Continue to avoid nephrotoxic agents and adjust medications for his renal function ?-Continue to follow renal function trend/stability intermittently. ? ?Smoker ?-Cessation counseling provided ?-Continue the use of nicotine patch. ? ?Hyperlipidemia ?-Planning to resume the use of his statins and heart healthy diet when able to tolerate p.o.'s. ?-Currently with NG tube in place and avoiding oral agents. ? ? ? ?Subjective:  ?Decreased output in NG tube; no nausea, vomiting or abdominal pain currently reported.  Patient  is passing gas. ? ?Physical Exam: ?Vitals:  ? 09/08/21 1100 09/08/21 1244 09/08/21 2033 09/09/21 0359  ?BP:  Marland Kitchen)  156/81 (!) 143/82 139/77  ?Pulse:  66 73 64  ?Resp:  '18 18 18  '$ ?Temp:  (!) 97.4 ?F (36.3 ?C)    ?TempSrc:  Oral    ?SpO2:  97% 97% 97%  ?Weight: 65.1 kg     ?Height: '5\' 9"'$  (1.753 m)     ? ?General exam: Alert, awake, oriented x 3; reports no chest pain, no shortness of breath.  No nausea, vomiting or abdominal pain.  Patient continue passing gas.  Still without bowel movements. ?Respiratory system: Clear to auscultation. Respiratory effort normal.  No requiring oxygen supplementation.  No using accessory muscles. ?Cardiovascular system:RRR. No murmurs, rubs, gallops.  No JVD. ?Gastrointestinal system: Abdomen is nondistended, soft and with positive bowel sounds.  No tenderness appreciated on palpation; mid incisional wound healing appropriately (noticed to be clean, dry and intact) ?Central nervous system: Alert and oriented. No focal neurological deficits. ?Extremities: No C/C/E, +pedal pulses ?Skin: No rashes, lesions or ulcers ?Psychiatry: Judgement and insight appear normal. Mood & affect appropriate.  ? ?Data Reviewed: ?Hemodynamically stable; passing gas and with decreased output from NG tube. ?Renal function and electrolytes stable. ?-K5.7 ? ?Family Communication: Wife at bedside. ? ?Disposition: ?Status is: Inpatient ?Remains inpatient appropriate because: Waiting resolution of patient SBO, discontinuation of NG tube and availability to tolerate oral intake.  Will follow recommendations by general surgery. ? ? ? Planned Discharge Destination: Home ? ? ?Author: ?Barton Dubois, MD ?09/09/2021 1:08 PM ? ?For on call review [email or web address].  ? ?

## 2021-09-09 NOTE — Progress Notes (Addendum)
Evaluated the patient's 8 hour xray and discussed imaging findings with radiology.  While he does have the previously visualized contrast in his distal colon, he appears to have new contrast in his right colon (new from the KUB this AM), indicating that the gastrograffin from the small bowel obstruction study today has made it to the colon.  Discussed with the patient's nurse, and he had a formed bowel movement earlier and a more recent episode of diarrhea.  Given contrast in the right colon and bowel function, will remove the patient's NG tube and initiate a CLD.  If patient is unable to tolerate clears or has significant N/V with drinking, will need to replace NG tube.  Please call with any questions. ? ?Graciella Freer, DO ?[facility] ?[place], [place] [address] ?[phone] (office) ? ?

## 2021-09-09 NOTE — Progress Notes (Signed)
[facility] Progress Note ? ?   ?Subjective: ?Patient seen and examined.  He is resting comfortably in bed.  Denies significant nausea, vomiting, and abdominal pain.  He confirms continuing to pass flatus today.  Denies bowel movements.  His NG tube had 1050 cc of bilious output in the last 24 hours. ? ?Objective: ?Vital signs in last 24 hours: ?Temp:  [97.4 ?F (36.3 ?C)] 97.4 ?F (36.3 ?C) (03/03 1244) ?Pulse Rate:  [64-73] 64 (03/04 0359) ?Resp:  [18] 18 (03/04 0359) ?BP: (139-156)/(77-82) 139/77 (03/04 0359) ?SpO2:  [97 %] 97 % (03/04 0359) ?Last BM Date : 09/05/21 ? ?Intake/Output from previous day: ?03/03 0701 - 03/04 0700 ?In: 2877 [I.V.:2377; IV Piggyback:500] ?Out: 2175 [Urine:1125; Emesis/NG output:1050] ?Intake/Output this shift: ?Total I/O ?In: -  ?Out: 400 [Urine:400] ? ?General appearance: alert, cooperative, and no distress ?Nose: NG tube in place ?GI: Abdomen soft, nondistended, minimal tenderness to palpation, no rigidity, guarding, rebound tenderness; midline incision C/D/I with skin staples in place, healing well ? ?Lab Results:  ?Recent Labs  ?  09/06/21 ?1513 09/07/21 ?1937  ?WBC 14.5* 11.0*  ?HGB 14.1 12.3*  ?HCT 43.7 38.7*  ?PLT 580* 461*  ? ?BMET ?Recent Labs  ?  09/08/21 ?0614 09/09/21 ?0614  ?NA 138 137  ?K 5.7* 5.7*  ?CL 105 102  ?CO2 25 22  ?GLUCOSE 82 74  ?BUN 23 18  ?CREATININE 1.15 1.05  ?CALCIUM 8.6* 8.7*  ? ?PT/INR ?No results for input(s): LABPROT, INR in the last 72 hours. ? ?Studies/Results: ?DG Abd Portable 1V-Small Bowel Obstruction Protocol-initial, 8 hr delay ? ?Result Date: 09/09/2021 ?CLINICAL DATA:  Abdominal pain.  Small bowel obstruction. EXAM: PORTABLE ABDOMEN - 1 VIEW COMPARISON:  CT AP 09/06/2021. FINDINGS: The enteric tube tip and side port are below the GE junction. Persistent abnormal dilatation of small bowel loops within the left hemiabdomen. These measure up to 5.6 cm on today's study. Retained enteric contrast material within decreased caliber  colon is similar to previous exam. IMPRESSION: Persistent small bowel dilatation compatible with small bowel obstruction. Electronically Signed   By: Kerby Moors M.D.   On: 09/09/2021 09:11   ? ?Anti-infectives: ?Anti-infectives (From admission, onward)  ? ? Start     Dose/Rate Route Frequency Ordered Stop  ? 09/08/21 2100  levofloxacin (LEVAQUIN) IVPB 500 mg       ? 500 mg ?100 mL/hr over 60 Minutes Intravenous Every 24 hours 09/08/21 1424    ? 09/07/21 2100  Levofloxacin (LEVAQUIN) IVPB 250 mg  Status:  Discontinued       ? 250 mg ?50 mL/hr over 60 Minutes Intravenous Every 24 hours 09/06/21 2141 09/08/21 1424  ? 09/06/21 2230  levofloxacin (LEVAQUIN) IVPB 500 mg       ? 500 mg ?100 mL/hr over 60 Minutes Intravenous  Once 09/06/21 2141 09/07/21 0930  ? 09/06/21 1945  ampicillin (OMNIPEN) 1 g in sodium chloride 0.9 % 100 mL IVPB       ? 1 g ?300 mL/hr over 20 Minutes Intravenous Every 6 hours 09/06/21 1944    ? 09/06/21 1945  Levofloxacin (LEVAQUIN) IVPB 250 mg  Status:  Discontinued       ? 250 mg ?50 mL/hr over 60 Minutes Intravenous Every 24 hours 09/06/21 1944 09/06/21 2141  ? ?  ? ? ?Assessment/Plan: ? ?Patient is a 66 year old male who was admitted with concern for small bowel obstruction, pancreatitis, dehydration.  CT abdomen pelvis demonstrated concern for small bowel obstruction with transition point  in the left anterior abdomen. ? ?-KUB this a.m. with continued dilated loops of bowel ?-Given patient's flatus and minimal abdominal pain, small bowel obstruction protocol ordered ?-Await 8-hour KUB ?-Discussed that if contrast is in the colon at 8 hours, will remove NG tube and initiate clear liquid diet.  However, if there is no contrast in the colon at 8 hours, will obtain another imaging study at 24 hours.  If the contrast fails to reach the colon at 24 hours, discussed that the patient will likely need to undergo surgery for bowel obstruction.  Patient and his wife are understanding. ?-NG currently  clamped for recent administration of Gastrografin, once adequate time period is passed, reinitiate NG tube to LIS ?-NPO ?-Continue to monitor for bowel function ?-As needed pain control and antiemetics ?-Continue H. pylori treatment of Levaquin, ampicillin, and PPI ?-Appreciate hospitalist recommendations ? ? LOS: 3 days  ? ? ?Colston Pyle A Cathline Dowen ?09/09/2021 ? ?

## 2021-09-10 DIAGNOSIS — E860 Dehydration: Secondary | ICD-10-CM

## 2021-09-10 LAB — BASIC METABOLIC PANEL
Anion gap: 10 (ref 5–15)
BUN: 14 mg/dL (ref 8–23)
CO2: 23 mmol/L (ref 22–32)
Calcium: 8.2 mg/dL — ABNORMAL LOW (ref 8.9–10.3)
Chloride: 102 mmol/L (ref 98–111)
Creatinine, Ser: 0.99 mg/dL (ref 0.61–1.24)
GFR, Estimated: >60 mL/min (ref >60)
Glucose, Bld: 87 mg/dL (ref 70–99)
Potassium: 4.4 mmol/L (ref 3.5–5.1)
Sodium: 135 mmol/L (ref 135–145)

## 2021-09-10 LAB — CBC
HCT: 35.2 % — ABNORMAL LOW (ref 39.0–52.0)
Hemoglobin: 10.9 g/dL — ABNORMAL LOW (ref 13.0–17.0)
MCH: 28.8 pg (ref 26.0–34.0)
MCHC: 31 g/dL (ref 30.0–36.0)
MCV: 92.9 fL (ref 80.0–100.0)
Platelets: 385 10*3/uL (ref 150–400)
RBC: 3.79 MIL/uL — ABNORMAL LOW (ref 4.22–5.81)
RDW: 12 % (ref 11.5–15.5)
WBC: 5.6 10*3/uL (ref 4.0–10.5)
nRBC: 0 % (ref 0.0–0.2)

## 2021-09-10 LAB — LIPASE, BLOOD: Lipase: 193 U/L — ABNORMAL HIGH (ref 11–51)

## 2021-09-10 MED ORDER — LEVOFLOXACIN 500 MG PO TABS: 500.0000 mg | ORAL_TABLET | Freq: Every day | ORAL | 0 refills | Status: AC

## 2021-09-10 NOTE — Assessment & Plan Note (Signed)
-  resolved with fluid resuscitation 

## 2021-09-10 NOTE — Discharge Summary (Signed)
Physician Discharge Summary   Patient: Michael Brady MRN: [id number] DOB: [birth year]  Admit date:     09/06/2021  Discharge date: 09/10/21  Discharge Physician: Barton Dubois   PCP: Coral Spikes, DO   Recommendations at discharge:  Repeat basic metabolic panel to follow electrolytes and renal function. Continue assisting patient with a smoking cessation. Make sure he has follow-up with gastroenterologist.  Instructed to assure complete resolution of H. pylori infection after treatment completed.   Discharge Diagnoses: Principal Problem:   Acute pancreatitis Active Problems:   Abdominal pain   Hyperlipidemia   Smoker   CKD (chronic kidney disease) stage 3, GFR 30-59 ml/min (HCC)   History of renal pelvis cancer   Nausea and vomiting   H. pylori infection   Dehydration  Resolved Problems:   H/O left nephrectomy   Small bowel obstruction [facility])  Brief hospital admission narrative course: As per H&P written by Dr. Waldron Labs on 09/06/2021 Michael Brady  is a 66 y.o. 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 -Patient with recent hospitalization where hewas discharged from here on February 23 for surgical repair of a perforated gastric ulcer and incarcerated incisional hernia, he was discharged on amoxicillin clarithromycin and PPI for H. Pylori Infection. -Patient was sent by general surgery to ED for evaluation for nausea, vomiting and abdominal pain, patient reports the first 2 days after discharge she had no complaints, but then he started to develop some generalized weakness, fatigue, poor appetite and oral intake, he started to have nausea, and he started to have abdominal pain and vomiting over the last 24 hours, he was seen by his PCP he was sent for further evaluation.  He denies any fever, chills,  dysuria, polyuria, hemoptysis or coffee-ground emesis.   Emergency department work-up significant for a creatinine of 1.46, lipase of 982, white blood cell count of 14.5, CT abdomen with no pancreatic or gallbladder findings, but significant for SBO and left lower abdomen.   Assessment and Plan: * Acute pancreatitis -Most likely in the setting of clarithromycin usage. -Offending agent has been discontinued. -Patient advised to keep himself well-hydrated, continue as needed antiemetics and analgesics. -Lipase level trended down appropriately and he was not experiencing any pain, nausea or vomiting at discharge.   Abdominal pain -In the setting of acute pancreatitis and SBO -Excellent response to conservative management: Rest, NG tube, fluid resuscitation, electrolyte repletion as needed antiemetics and analgesics. -At discharge no complaining of abdominal pain, no nausea, no vomiting, moving his bowels and tolerating diet. -Patient will follow-up with general surgery in PCP as an outpatient    Dehydration -resolved with fluid resuscitation.  H. pylori infection -Complete treatment with the use of ampicillin, Levaquin and PPI. -Clarithromycin has been discontinued secondary to acute pancreatitis development.  Nausea and vomiting -Secondary to acute pancreatitis and SBO -Continue treatment as mentioned with conservative management, adequate hydration, slowly advancing diet and PRN analgesics and antiemetics. -outpatient follow up with PCP and general surgery.    History of renal pelvis cancer -Left renal pelvis cancer/bladder cancer status post left robotic nephro or hepatectomy for large volume.  Negative margins were achieved. -Continue outpatient follow-up with urology/oncology service.  CKD (chronic kidney disease) stage 3, GFR 30-59 ml/min (HCC) -Overall stable and at baseline currently. -Continue to avoid nephrotoxic agents and maintain adequate hydration. -Repeat basic  metabolic panel follow-up visit to assess renal function trend/stability.   Smoker -Cessation counseling provided -Continue the use of nicotine patch. -patient contemplating quitting.  Hyperlipidemia -Planning to resume the use of his statins and heart healthy diet when able to tolerate p.o.'s. -Currently with NG tube in place and avoiding oral agents.   Consultants: General surgery Procedures performed: See below for x-ray reports Disposition: Discharged home  Diet recommendation: Heart healthy soft diet.  DISCHARGE MEDICATION: Allergies as of 09/10/2021   No Known Allergies      Medication List     STOP taking these medications    clarithromycin 500 MG tablet Commonly known as: BIAXIN   oxyCODONE 5 MG immediate release tablet Commonly known as: Oxy IR/ROXICODONE       TAKE these medications    acetaminophen 500 MG tablet Commonly known as: TYLENOL Take 2 tablets (1,000 mg total) by mouth every 6 (six) hours.   amoxicillin 500 MG tablet Commonly known as: AMOXIL Take 2 tablets (1,000 mg total) by mouth 2 (two) times daily for 14 days.   aspirin EC 81 MG tablet Take 81 mg by mouth daily. Swallow whole.   citalopram 20 MG tablet Commonly known as: CELEXA TAKE 1 TABLET BY MOUTH DAILY   fluticasone 50 MCG/ACT nasal spray Commonly known as: FLONASE SHAKE LIQUID AND USE 2 SPRAYS IN EACH NOSTRIL DAILY   levofloxacin 500 MG tablet Commonly known as: Levaquin Take 1 tablet (500 mg total) by mouth daily for 5 days.   multivitamin with minerals tablet Take 1 tablet by mouth daily.   nicotine 21 mg/24hr patch Commonly known as: NICODERM CQ - dosed in mg/24 hours Place 1 patch (21 mg total) onto the skin daily as needed (nicotine craving).   ondansetron 4 MG tablet Commonly known as: Zofran Take 1 tablet (4 mg total) by mouth every 8 (eight) hours as needed for nausea or vomiting.   pantoprazole 40 MG tablet Commonly known as: Protonix Take 1 tablet (40  mg total) by mouth daily for 14 days.        Follow-up Information     Coral Spikes, DO. Schedule an appointment as soon as possible for a visit in 2 week(s).   Specialty: Family Medicine Contact information: [place] [address] [phone]                 Discharge Exam: Danley Danker Weights   09/08/21 1100  Weight: 65.1 kg   General exam: Alert, awake, oriented x 3; successfully with removal of NG tube accomplished.  Patient tolerating diet, denies shortness of breath, chest pain, nausea, vomiting or any significant abdominal discomfort. Respiratory system: Good air movement bilaterally; positive rhonchi, no wheezing.  Respiratory effort normal.  Good saturation on room air; no using accessory muscles. Cardiovascular system:RRR. No murmurs, rubs, gallops.  No JVD. Gastrointestinal system: Abdomen is nondistended, soft and nontender on palpation. No organomegaly or masses felt. Normal bowel sounds heard.  Adequately healing mid incisional wound from previous recent surgical intervention.  Wound is clean, dry and intact. Central nervous system: Alert and oriented. No focal neurological deficits. Extremities: No cyanosis or clubbing. Skin: No petechiae. Psychiatry: Judgement and insight appear normal. Mood & affect appropriate.    Condition at discharge: Stable and improved.  The results of significant diagnostics from this hospitalization (including imaging, microbiology, ancillary and laboratory) are listed below for reference.   Imaging Studies: CT ABDOMEN PELVIS WO CONTRAST  Result Date: 09/06/2021 CLINICAL DATA:  Left upper quadrant abdominal pain. EXAM: CT ABDOMEN AND PELVIS WITHOUT CONTRAST TECHNIQUE: Multidetector CT imaging of the abdomen and pelvis was performed following the standard protocol without IV contrast. RADIATION DOSE REDUCTION: This exam was performed according to the departmental dose-optimization program which includes automated  exposure control, adjustment of the mA and/or kV according to patient size and/or use of iterative reconstruction technique. COMPARISON:  CT abdomen pelvis dated 08/26/2021. FINDINGS: Evaluation of this exam is limited in the absence of intravenous contrast. Lower chest: The visualized lung bases are clear. There is mild elevation of the left hemidiaphragm. There is coronary vascular calcification. Trace free fluid adjacent to the liver and a small focus of air under the right hemidiaphragm, likely postoperative. Hepatobiliary: No focal liver abnormality is seen. No gallstones, gallbladder wall thickening, or biliary dilatation. Pancreas: Unremarkable. No pancreatic ductal dilatation or surrounding inflammatory changes. Spleen: Normal in size without focal abnormality. Adrenals/Urinary Tract: The adrenal glands are unremarkable. Status post prior left nephrectomy. The right kidney, right ureter, and urinary bladder appear unremarkable. Stomach/Bowel: Evaluation of the bowel is limited in the absence of oral contrast. There multiple fluid-filled and dilated loops of small bowel in the upper and mid abdomen measuring up to 5.5 cm in caliber. The distal small bowel and terminal ileum are collapsed. A transition is suspected in the left anterior abdomen (coronal 30/5 and axial 41/2). There is severe sigmoid diverticulosis. Oral contrast from prior study noted throughout the colon. The appendix is normal. Vascular/Lymphatic: Advanced aortoiliac atherosclerotic disease. There is a 3 cm infrarenal abdominal aortic aneurysm. The IVC is unremarkable. No portal venous gas. There is no adenopathy. Reproductive: The prostate and seminal vesicles are grossly unremarkable. No pelvic mass. Other: Midline vertical anterior abdominal wall surgical incision and cutaneous staples. No fluid collection. Musculoskeletal: Osteopenia with degenerative changes of the spine. No acute osseous pathology. IMPRESSION: 1. Small-bowel obstruction  with transition suspected in the left anterior abdomen. 2. Severe sigmoid diverticulosis. 3. Status post prior left nephrectomy. 4. A 3 cm infrarenal abdominal aortic aneurysm. Recommend follow-up ultrasound every 3 years. This recommendation follows ACR consensus guidelines: White Paper of the ACR Incidental Findings Committee II on Vascular Findings. J Am Coll Radiol 2013; 58:[id number]. 5. Aortic Atherosclerosis (ICD10-I70.0). Electronically Signed   By: Anner Crete M.D.   On: 09/06/2021 18:17   CT ABDOMEN PELVIS W CONTRAST  Result Date: 08/26/2021 CLINICAL DATA:  66 year old male with acute 10/10 epigastric abdominal pain. Pancreatitis suspected. EXAM: CT ABDOMEN AND PELVIS WITH CONTRAST TECHNIQUE: Multidetector CT imaging of the abdomen and pelvis was performed using the standard protocol following bolus administration of intravenous contrast. RADIATION DOSE REDUCTION: This exam was performed according to the departmental dose-optimization program which includes automated exposure control, adjustment of the mA and/or kV according to patient size and/or use of iterative reconstruction technique. CONTRAST:  31m OMNIPAQUE IOHEXOL 300 MG/ML  SOLN COMPARISON:  CT Abdomen and Pelvis [facility] 06/12/2019 and earlier. FINDINGS: Lower chest: Negative. No pericardial or pleural effusion. Hepatobiliary: Small volume pneumoperitoneum and free fluid around the liver. Mildly complex fluid density. Liver enhancement maintained. Gallbladder within normal limits. No bile duct enlargement. Pancreas: Within normal limits. Spleen: Small volume perisplenic fluid. Otherwise negative. Adrenals/Urinary Tract: Chronically absent left kidney. Stable postoperative appearance of the left renal fossa since 2020. Normal adrenal glands. Nonobstructed solitary right kidney. Normal right renal contrast excretion. Diminutive bladder. Stomach/Bowel: Severe diverticulosis of the descending and sigmoid colon, bowel walls  of those segments appear highly indistinct. Small volume  pneumoperitoneum under the diaphragm and situated adjacent to the liver. No retroperitoneal gas is identified. Complex fluid layering in both pericolic gutters. Small volume free fluid elsewhere in the abdomen. Superimposed chronic but enlarged and inflamed appearing umbilical hernia containing transverse mesocolon (series 2, image 40). No free air within the hernia, but underlying transverse colon diverticula and irregular bowel wall appearance there also. Additionally, there is a large gas containing 6.4 cm diverticulum versus contained perforation immediately subjacent to the herniated mesentery (coronal image 64) which is in proximity to both the transverse colon and inflamed gastroduodenal junction. Axial images however suggest this communicates with the transverse colon (series 2 images 35 through 39). Right: Mostly decompressed and fluid containing. No superimposed dilated small bowel. Small gastric hiatal hernia, and small volume of pneumoperitoneum in that hernia sac. Inflamed appearance of both the gastric antrum and the duodenal C loop as seen on coronal image 38. Duodenum is decompressed. The distal duodenum and ligament of Treitz have a more normal appearance. No other inflamed small bowel loops identified. Cecum is located in the midline, and midline appendix appears normal on coronal image 49. Vascular/Lymphatic: Widespread Aortoiliac calcified atherosclerosis. Tortuous infrarenal abdominal aorta, but no infrarenal aneurysm considering less than 1.5 times the normal caliber. Major arterial structures remain patent. No lymphadenopathy identified. Portal venous system patent. Reproductive: Negative. Other: Complex free fluid in the pelvis, moderate to large volume. Musculoskeletal: Chronic grade 1 spondylolisthesis at L4-L5. No acute osseous abnormality identified. IMPRESSION: 1. Positive for Acutely Perforated Bowel. In the epigastrium both a  transverse mesocolon containing umbilical hernia and the gastroduodenal junction appear acutely inflamed. Just deep to the ventral abdominal hernia a 6.4 cm pocket of gas could be a giant diverticulum, or perforation site, and is favored to arise from the Transverse Colon. But the possibility of a perforated peptic ulcer disease remains. 2. Associated pneumoperitoneum and mildly complex free fluid in the abdomen and pelvis. 3. Superimposed severe diverticulosis of both the descending and sigmoid colon - both of which have indistinct bowel walls, but an epigastric source of perforation is favored as in #1. 4. No bowel obstruction.  No evidence of pancreatitis. 5. Chronically left nephrectomy. Advanced Aortic Atherosclerosis (ICD10-I70.0), tortuous aorta. Study discussed by telephone with Dr. Delora Fuel on 02/16/314 at 05:56 . Electronically Signed   By: Genevie Ann M.D.   On: 08/26/2021 06:05   DG Chest 1V REPEAT Same Day  Result Date: 08/26/2021 CLINICAL DATA:  NG tube placement EXAM: CHEST - 1 VIEW SAME DAY COMPARISON:  Previous studies including the examination done earlier today FINDINGS: There is interval repositioning of enteric tube with its tip in the stomach. Skin staples are seen in the abdomen. Cardiac size is within normal limits. Increased density is seen in the medial left lower lung fields. In the CT done earlier today there was no definite focal infiltrate in the medial left lower lung fields. There is soft tissue fullness in the retrocardiac region which may be due to tortuous thoracic aorta or small fixed hiatal hernia. Costophrenic angles are clear. There is no pneumothorax. Degenerative changes are noted in right shoulder. There is large smooth marginated calcification adjacent to the inferior margin of glenoid on the right side possibly a loose body or old ununited fracture. IMPRESSION: There is interval repositioning of enteric tube with its tip in the stomach. There is increased opacity in the  medial left lower lung fields suggesting atelectasis. Possible small hiatal hernia. Electronically Signed   By: Elmer Picker  M.D.   On: 08/26/2021 15:42   DG CHEST PORT 1 VIEW  Result Date: 09/06/2021 CLINICAL DATA:  Nasogastric tube placement EXAM: PORTABLE CHEST 1 VIEW COMPARISON:  None. FINDINGS: Nasogastric tube is in place with its proximal side hole within the distal esophagus and its tip just beyond the gastroesophageal junction. Dilated loops of small bowel are identified within the visualized upper abdomen. Lungs are clear. No pneumothorax or pleural effusion. Cardiac size within normal limits. Degenerative changes are noted within the right shoulder, particular involving the greater tuberosity with intra-articular loose body noted IMPRESSION: Nasogastric tube just beyond the gastroesophageal junction with the proximal side hole within the distal esophagus. Advancement of the catheter by 15-20 cm would more optimally position the catheter within the gastric lumen. Electronically Signed   By: Fidela Salisbury M.D.   On: 09/06/2021 20:59   DG CHEST PORT 1 VIEW  Result Date: 08/26/2021 CLINICAL DATA:  NG tube placement EXAM: PORTABLE CHEST 1 VIEW COMPARISON:  03/15/2020 FINDINGS: Esophagogastric tube is positioned folded and doubled back over the esophagus. The heart size and mediastinal contours are within normal limits. Both lungs are clear. The visualized skeletal structures are unremarkable. IMPRESSION: 1. Esophagogastric tube is positioned folded and doubled back over the esophagus. Recommend repositioning. 2. No acute abnormality of the lungs in AP portable projection. Electronically Signed   By: Delanna Ahmadi M.D.   On: 08/26/2021 13:48   DG Abd Portable 1V-Small Bowel Obstruction Protocol-initial, 8 hr delay  Result Date: 09/09/2021 CLINICAL DATA:  Nausea vomiting. Small-bowel obstruction. Postop exam. EXAM: PORTABLE ABDOMEN - 1 VIEW COMPARISON:  09/09/2021 at 8:52 a.m. FINDINGS: Dilute  contrast is noted in dilated small bowel loops in the left upper abdomen. There is contrast within a normal caliber colon. The small bowel dilation in the upper abdomen is without convincing change from the earlier exam. Nasogastric tube is stable, tip in the mid stomach. Midline skin staples are also unchanged. IMPRESSION: 1. No change from the earlier exam. 2. Dilated small bowel with normal caliber colon consistent with a partial small bowel obstruction. Electronically Signed   By: Lajean Manes M.D.   On: 09/09/2021 18:50   DG Abd Portable 1V-Small Bowel Obstruction Protocol-initial, 8 hr delay  Result Date: 09/09/2021 CLINICAL DATA:  Abdominal pain.  Small bowel obstruction. EXAM: PORTABLE ABDOMEN - 1 VIEW COMPARISON:  CT AP 09/06/2021. FINDINGS: The enteric tube tip and side port are below the GE junction. Persistent abnormal dilatation of small bowel loops within the left hemiabdomen. These measure up to 5.6 cm on today's study. Retained enteric contrast material within decreased caliber colon is similar to previous exam. IMPRESSION: Persistent small bowel dilatation compatible with small bowel obstruction. Electronically Signed   By: Kerby Moors M.D.   On: 09/09/2021 09:11   DG UGI W SINGLE CM (SOL OR THIN BA)  Result Date: 08/28/2021 CLINICAL DATA:  Perforated ulcer anterior wall duodenal bulb post repair EXAM: WATER SOLUBLE UPPER GI SERIES TECHNIQUE: Single-column upper GI series was performed using water soluble contrast. CONTRAST:  125m OMNIPAQUE IOHEXOL 300 MG/ML SOLN via nasogastric tube COMPARISON:  CT abdomen and pelvis 08/26/2021 FLUOROSCOPY: Fluoroscopy Time:  2 minutes 12 seconds Radiation Exposure Index (if provided by the fluoroscopic device): 136.5 mGy Number of Acquired Spot Images: 2 + multiple fluoroscopic screen captures FINDINGS: Normal bowel gas pattern. Nasogastric tube projects over stomach. Normal gastric distension without mass or ulceration. No gastric outlet obstruction.  Wall thickening of duodenal bulb consistent with perforated ulcer and  surgery. Duodenal sweep normal appearance. Normal position of ligament of Treitz. No extraluminal contrast is seen to suggest leak. IMPRESSION: Wall thickening at duodenal bulb consistent with history of perforated ulcer and Graham patch. No evidence of duodenal leak post repair of perforated duodenal ulcer. Electronically Signed   By: Lavonia Dana M.D.   On: 08/28/2021 13:56    Microbiology: Results for orders placed or performed during the hospital encounter of 09/06/21  Resp Panel by RT-PCR (Flu A&B, Covid) Nasopharyngeal Swab     Status: None   Collection Time: 09/06/21  5:50 PM   Specimen: Nasopharyngeal Swab; Nasopharyngeal(NP) swabs in vial transport medium  Result Value Ref Range Status   SARS Coronavirus 2 by RT PCR NEGATIVE NEGATIVE Final    Comment: (NOTE) SARS-CoV-2 target nucleic acids are NOT DETECTED.  The SARS-CoV-2 RNA is generally detectable in upper respiratory specimens during the acute phase of infection. The lowest concentration of SARS-CoV-2 viral copies this assay can detect is 138 copies/mL. A negative result does not preclude SARS-Cov-2 infection and should not be used as the sole basis for treatment or other patient management decisions. A negative result may occur with  improper specimen collection/handling, submission of specimen other than nasopharyngeal swab, presence of viral mutation(s) within the areas targeted by this assay, and inadequate number of viral copies(<138 copies/mL). A negative result must be combined with clinical observations, patient history, and epidemiological information. The expected result is Negative.  Fact Sheet for Patients:  EntrepreneurPulse.com.au  Fact Sheet for Healthcare Providers:  IncredibleEmployment.be  This test is no t yet approved or cleared by the [place] FDA and  has been authorized for detection and/or  diagnosis of SARS-CoV-2 by FDA under an Emergency Use Authorization (EUA). This EUA will remain  in effect (meaning this test can be used) for the duration of the COVID-19 declaration under Section 564(b)(1) of the Act, 21 U.S.C.section 360bbb-3(b)(1), unless the authorization is terminated  or revoked sooner.       Influenza A by PCR NEGATIVE NEGATIVE Final   Influenza B by PCR NEGATIVE NEGATIVE Final    Comment: (NOTE) The Xpert Xpress SARS-CoV-2/FLU/RSV plus assay is intended as an aid in the diagnosis of influenza from Nasopharyngeal swab specimens and should not be used as a sole basis for treatment. Nasal washings and aspirates are unacceptable for Xpert Xpress SARS-CoV-2/FLU/RSV testing.  Fact Sheet for Patients: EntrepreneurPulse.com.au  Fact Sheet for Healthcare Providers: IncredibleEmployment.be  This test is not yet approved or cleared by the [place] FDA and has been authorized for detection and/or diagnosis of SARS-CoV-2 by FDA under an Emergency Use Authorization (EUA). This EUA will remain in effect (meaning this test can be used) for the duration of the COVID-19 declaration under Section 564(b)(1) of the Act, 21 U.S.C. section 360bbb-3(b)(1), unless the authorization is terminated or revoked.  Performed at [facility], [address] [place], [place] [address]     Labs: CBC: Recent Labs  Lab 09/06/21 1513 09/07/21 0556 09/10/21 0421  WBC 14.5* 11.0* 5.6  NEUTROABS 10.7*  --   --   HGB 14.1 12.3* 10.9*  HCT 43.7 38.7* 35.2*  MCV 94.8 95.3 92.9  PLT 580* 461* 353   Basic Metabolic Panel: Recent Labs  Lab 09/06/21 1513 09/07/21 0556 09/08/21 0614 09/09/21 0614 09/10/21 0421  NA 136 137 138 137 135  K 4.6 4.6 5.7* 5.7* 4.4  CL 92* 100 105 102 102  CO2 32 '29 25 22 23  '$ GLUCOSE 108* 105* 82  74 87  BUN 30* 27* '23 18 14  '$ CREATININE 1.46* 1.24 1.15 1.05 0.99  CALCIUM 9.6 8.8* 8.6* 8.7* 8.2*   Liver  Function Tests: Recent Labs  Lab 09/06/21 1513 09/07/21 0556  AST 19 16  ALT 19 15  ALKPHOS 87 74  BILITOT 0.4 0.4  PROT 7.9 6.6  ALBUMIN 3.7 2.9*   Discharge time spent: greater than 30 minutes.  Signed: Barton Dubois, MD [facility] 09/10/2021

## 2021-09-10 NOTE — Progress Notes (Signed)
Patient had ng tube removed at approximately 2000.  Patient has been able to sip water and ginger ale with no issues.  Patient has had no c/o pain and has received no pain medication or nausea medication. Pain has been stable all night.  His only c/o is his inability to sleep in the hospital.  ?

## 2021-09-10 NOTE — Progress Notes (Signed)
[facility] Progress Note ? ?   ?Subjective: ?Patient seen and examined.  He is resting comfortably in bed.  He states that he feels very well, much better than he felt when he was discharged from the hospital last time.  He denies abdominal pain, and has not required pain medication in over 24 hours.  He confirms continuing to pass flatus, and he had 3 bowel movements last night and one this morning.  He was able to tolerate his clear liquid diet without nausea and vomiting.  He is asking for more food to eat, and is hopeful for potential discharge home later today. ? ?Objective: ?Vital signs in last 24 hours: ?Temp:  [97.9 ?F (36.6 ?C)-98.6 ?F (37 ?C)] 98.1 ?F (36.7 ?C) (03/05 0436) ?Pulse Rate:  [62-69] 62 (03/05 0436) ?Resp:  [15-20] 15 (03/05 0436) ?BP: (143-151)/(79-84) 143/83 (03/05 0436) ?SpO2:  [98 %-99 %] 99 % (03/05 0436) ?Last BM Date : 09/05/21 ? ?Intake/Output from previous day: ?03/04 0701 - 03/05 0700 ?In: -  ?Out: 650 [Urine:400; Emesis/NG output:250] ?Intake/Output this shift: ?Total I/O ?In: 480 [P.O.:480] ?Out: -  ? ?General appearance: alert, cooperative, and no distress ?GI: Abdomen soft, nondistended, no percussion tenderness, no tenderness to palpation, no rigidity, guarding, rebound tenderness; midline incision C/D/I with skin staples in place, healing well ? ?Lab Results:  ?Recent Labs  ?  09/10/21 ?0421  ?WBC 5.6  ?HGB 10.9*  ?HCT 35.2*  ?PLT 385  ? ?BMET ?Recent Labs  ?  09/09/21 ?0614 09/10/21 ?0421  ?NA 137 135  ?K 5.7* 4.4  ?CL 102 102  ?CO2 22 23  ?GLUCOSE 74 87  ?BUN 18 14  ?CREATININE 1.05 0.99  ?CALCIUM 8.7* 8.2*  ? ?PT/INR ?No results for input(s): LABPROT, INR in the last 72 hours. ? ?Studies/Results: ?DG Abd Portable 1V-Small Bowel Obstruction Protocol-initial, 8 hr delay ? ?Result Date: 09/09/2021 ?CLINICAL DATA:  Nausea vomiting. Small-bowel obstruction. Postop exam. EXAM: PORTABLE ABDOMEN - 1 VIEW COMPARISON:  09/09/2021 at 8:52 a.m. FINDINGS: Dilute contrast  is noted in dilated small bowel loops in the left upper abdomen. There is contrast within a normal caliber colon. The small bowel dilation in the upper abdomen is without convincing change from the earlier exam. Nasogastric tube is stable, tip in the mid stomach. Midline skin staples are also unchanged. IMPRESSION: 1. No change from the earlier exam. 2. Dilated small bowel with normal caliber colon consistent with a partial small bowel obstruction. Electronically Signed   By: Lajean Manes M.D.   On: 09/09/2021 18:50  ? ?DG Abd Portable 1V-Small Bowel Obstruction Protocol-initial, 8 hr delay ? ?Result Date: 09/09/2021 ?CLINICAL DATA:  Abdominal pain.  Small bowel obstruction. EXAM: PORTABLE ABDOMEN - 1 VIEW COMPARISON:  CT AP 09/06/2021. FINDINGS: The enteric tube tip and side port are below the GE junction. Persistent abnormal dilatation of small bowel loops within the left hemiabdomen. These measure up to 5.6 cm on today's study. Retained enteric contrast material within decreased caliber colon is similar to previous exam. IMPRESSION: Persistent small bowel dilatation compatible with small bowel obstruction. Electronically Signed   By: Kerby Moors M.D.   On: 09/09/2021 09:11   ? ?Anti-infectives: ?Anti-infectives (From admission, onward)  ? ? Start     Dose/Rate Route Frequency Ordered Stop  ? 09/08/21 2100  levofloxacin (LEVAQUIN) IVPB 500 mg       ? 500 mg ?100 mL/hr over 60 Minutes Intravenous Every 24 hours 09/08/21 1424    ? 09/07/21 2100  Levofloxacin (LEVAQUIN) IVPB 250 mg  Status:  Discontinued       ? 250 mg ?50 mL/hr over 60 Minutes Intravenous Every 24 hours 09/06/21 2141 09/08/21 1424  ? 09/06/21 2230  levofloxacin (LEVAQUIN) IVPB 500 mg       ? 500 mg ?100 mL/hr over 60 Minutes Intravenous  Once 09/06/21 2141 09/07/21 0930  ? 09/06/21 1945  ampicillin (OMNIPEN) 1 g in sodium chloride 0.9 % 100 mL IVPB       ? 1 g ?300 mL/hr over 20 Minutes Intravenous Every 6 hours 09/06/21 1944    ? 09/06/21 1945   Levofloxacin (LEVAQUIN) IVPB 250 mg  Status:  Discontinued       ? 250 mg ?50 mL/hr over 60 Minutes Intravenous Every 24 hours 09/06/21 1944 09/06/21 2141  ? ?  ? ? ?Assessment/Plan: ? ?Patient is a 66 year old male who was admitted with concern for small bowel obstruction, pancreatitis, and dehydration.  CT abdomen and pelvis demonstrated concern for small bowel obstruction with transition point in the left anterior abdomen. ? ?-Patient's small bowel obstruction protocol demonstrated new contrast in the right colon last night, and NG tube was removed and he was started on a clear liquid diet ?-Patient with numerous bowel movements last night and today ?-Patient tolerated clear liquid diet without nausea and vomiting ?-Advance to GI soft today ?-If patient is able to tolerate GI soft diet without nausea and vomiting, patient can be discharged home ?-I have a follow-up appointment scheduled for the patient on 3/7, and if he is able to be discharged today, I have discussed with him and his wife that he needs to follow-up with me at this appointment to verify that he is still doing well ?-Continue H. pylori treatment of Levaquin, ampicillin, and PPI; patient will need to be discharged home with PO Levaquin, amoxicillin, and PPI through 3/9 to complete his treatment of H. Pylori ?-PRN pain medications and antiemetics ?-Appreciate hospitalist recommendations ? ? LOS: 4 days  ? ? ?Maurio Baize A Carold Eisner ?09/10/2021 ? ?

## 2021-09-11 ENCOUNTER — Telehealth: Payer: Self-pay

## 2021-09-11 NOTE — Telephone Encounter (Signed)
Transition Care Management Follow-up Telephone Call ?Date of discharge and from where: 09/10/21 [facility]. ?How have you been since you were released from the hospital? Pt states he is doing good.  ?Any questions or concerns? No ? ?Items Reviewed: ?Did the pt receive and understand the discharge instructions provided? Yes  ?Medications obtained and verified? Yes  ?Other? No  ?Any new allergies since your discharge? No  ?Dietary orders reviewed? Yes ?Do you have support at home? Yes  ? ?Home Care and Equipment/Supplies: ?Were home health services ordered? no ?If so, what is the name of the agency? N/A  ?Has the agency set up a time to come to the patient's home? no ?Were any new equipment or medical supplies ordered?  No ?What is the name of the medical supply agency? N/A ?Were you able to get the supplies/equipment? not applicable ?Do you have any questions related to the use of the equipment or supplies? No ? ?Functional Questionnaire: (I = Independent and D = Dependent) ?ADLs: I ? ?Bathing/Dressing- I ? ?Meal Prep- I-Wife assists. ? ?Eating- I ? ?Maintaining continence- I ? ?Transferring/Ambulation- I ? ?Managing Meds- I ? ?Follow up appointments reviewed: ? ?PCP Hospital f/u appt confirmed? Yes  Scheduled to see Dr. Lacinda Axon on 09/14/21 @ 9:20 am. ?[facility] f/u appt confirmed? Yes  Scheduled to see Dr. Okey Dupre on 09/12/21 @ 1:45. ?Are transportation arrangements needed? No  ?If their condition worsens, is the pt aware to call PCP or go to the Emergency Dept.? Yes ?Was the patient provided with contact information for the PCP's office or ED? Yes ?Was to pt encouraged to call back with questions or concerns? Yes ? ?

## 2021-09-12 ENCOUNTER — Encounter: Payer: Self-pay | Admitting: Surgery

## 2021-09-12 ENCOUNTER — Ambulatory Visit (INDEPENDENT_AMBULATORY_CARE_PROVIDER_SITE_OTHER): Payer: BC Managed Care – PPO | Admitting: Surgery

## 2021-09-12 ENCOUNTER — Other Ambulatory Visit: Payer: Self-pay

## 2021-09-12 VITALS — BP 122/81 | HR 76 | Temp 97.6°F | Resp 16 | Ht 69.0 in | Wt 149.0 lb

## 2021-09-12 DIAGNOSIS — Z09 Encounter for follow-up examination after completed treatment for conditions other than malignant neoplasm: Secondary | ICD-10-CM

## 2021-09-12 NOTE — Progress Notes (Signed)
[facility] Note  ? ?HPI:  ?66 y.o. Male presents to clinic for post-op follow-up after exploratory laparotomy, gastrorrhaphy, omental patch repair, and repair of incarcerated incisional hernia of a gastroduodenal ulcer perforation on 2/18 and subsequent admission to the hospital for small bowel obstruction.. Patient reports he has been doing well since discharge on Sunday.  He is tolerating his diet without any nausea and vomiting.  He has minimal abdominal pain, and most of the pain is associated with his midline incision.  He is moving his bowels without difficulty.  Denies fevers and chills. ? ?Review of Systems:  ?All other review of systems: otherwise negative  ? ?Vital Signs:  ?BP 122/81   Pulse 76   Temp 97.6 ?F (36.4 ?C) (Oral)   Resp 16   Ht '5\' 9"'$  (1.753 m)   Wt 149 lb (67.6 kg)   SpO2 93%   BMI 22.00 kg/m?   ? ?Physical Exam:  ?Physical Exam ?Vitals reviewed.  ?Constitutional:   ?   Appearance: Normal appearance.  ?Abdominal:  ?   Comments: Abdomen soft, nondistended, no percussion tenderness, no tenderness to palpation, no rigidity, guarding, rebound tenderness; midline incision healing well with skin staples in place  ?Neurological:  ?   Mental Status: He is alert.  ? ?Laboratory studies: None ? ?Imaging:  ?None ? ?Pathology: ?A. HERNIA SAC, EXCISION:  ?- Hernia sac with focal acute inflammation.  ? ?Assessment:  ?66 y.o. yo Male who presents for follow-up after exploratory laparotomy, gastrorrhaphy, omental patch repair, and incarcerated incisional hernia repair on 2/18 and subsequent readmission for small bowel obstruction in the left lower quadrant. ? ?Plan:  ?-Patient doing very well since discharge from the hospital, tolerating diet and moving his bowels without significant issue ?-Skin staples removed at bedside, incision healing very well ?-Advised patient that if he continues to have issues with bowel obstructions from scar tissue in his lower abdomen, could discuss  elective surgical evaluation, however we will just continue to monitor patient at this time ?-Follow-up with me as needed ? ?All of the above recommendations were discussed with the patient and patient's family, and all of patient's and family's questions were answered to their expressed satisfaction. ? ?Graciella Freer, DO ?[facility] ?[address] [place], [place] [address] ?[phone] (office) ? ?

## 2021-09-13 DIAGNOSIS — H0102B Squamous blepharitis left eye, upper and lower eyelids: Secondary | ICD-10-CM | POA: Diagnosis not present

## 2021-09-13 DIAGNOSIS — H0102A Squamous blepharitis right eye, upper and lower eyelids: Secondary | ICD-10-CM | POA: Diagnosis not present

## 2021-09-13 DIAGNOSIS — H2513 Age-related nuclear cataract, bilateral: Secondary | ICD-10-CM | POA: Diagnosis not present

## 2021-09-14 ENCOUNTER — Other Ambulatory Visit: Payer: Self-pay

## 2021-09-14 ENCOUNTER — Ambulatory Visit: Payer: BC Managed Care – PPO | Admitting: Family Medicine

## 2021-09-14 ENCOUNTER — Encounter: Payer: Self-pay | Admitting: Family Medicine

## 2021-09-14 VITALS — BP 135/84 | HR 77 | Temp 97.8°F | Wt 146.4 lb

## 2021-09-14 DIAGNOSIS — A048 Other specified bacterial intestinal infections: Secondary | ICD-10-CM

## 2021-09-14 DIAGNOSIS — K56609 Unspecified intestinal obstruction, unspecified as to partial versus complete obstruction: Secondary | ICD-10-CM | POA: Insufficient documentation

## 2021-09-14 DIAGNOSIS — D649 Anemia, unspecified: Secondary | ICD-10-CM | POA: Diagnosis not present

## 2021-09-14 DIAGNOSIS — Z125 Encounter for screening for malignant neoplasm of prostate: Secondary | ICD-10-CM

## 2021-09-14 DIAGNOSIS — N289 Disorder of kidney and ureter, unspecified: Secondary | ICD-10-CM | POA: Insufficient documentation

## 2021-09-14 DIAGNOSIS — E785 Hyperlipidemia, unspecified: Secondary | ICD-10-CM | POA: Diagnosis not present

## 2021-09-14 DIAGNOSIS — K859 Acute pancreatitis without necrosis or infection, unspecified: Secondary | ICD-10-CM

## 2021-09-14 NOTE — Progress Notes (Signed)
? ?Subjective:  ?Patient ID: Michael Brady, male    DOB: 1955/12/23  Age: 66 y.o. MRN: [id number] ? ?CC: ?Chief Complaint  ?Patient presents with  ? Hospitalization Follow-up  ?  [facility] from 09/06/21-09/10/21. Pt states he is doing better.  ? ? ?HPI: ? ?65 year old male presents for hospital follow-up. ? ?Patient recently readmitted to the hospital surgery.  See prior hospital encounter.  I initially saw him in the office and he had intractable nausea and vomiting.  Further work-up in the ER revealed small bowel obstruction as well as pancreatitis.  Admitted from 3/1 to 3/5.  Patient's labs, imaging, and course including discharge summary reviewed in detail.  Patient progressed with supportive care and IV hydration.  Diet was slowly advanced and patient did well during hospitalization.  Pancreatitis was thought to be from clarithromycin.  Antibiotic course was changed.  Patient is almost finished with amoxicillin Levaquin. ? ?Patient states that he is doing well at this time.  He has been doing well with smoking cessation.  Currently wearing a nicotine patch.  Eating and drinking well.  He does note that he is still quite fatigued.  Denies abdominal pain.  Has no other complaints or concerns at this time. ? ?Patient Active Problem List  ? Diagnosis Date Noted  ? Renal insufficiency 09/14/2021  ? SBO (small bowel obstruction) ([place]) 09/14/2021  ? H. pylori infection 09/07/2021  ? Acute pancreatitis 09/06/2021  ? 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 Severe Diverticulosis 08/26/2021  ? Smoker 05/31/2020  ? 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 Hyperlipidemia 04/03/2017  ? ? ?Social Hx   ?Social History  ? ?Socioeconomic History  ? Marital status: Married  ?  Spouse name: Not on file  ? Number of children: Not on file  ? Years of education: Not on file  ? Highest education level: Not on file  ?Occupational History  ? Not on file  ?Tobacco Use  ? Smoking status: Every Day  ?  Packs/day:  1.50  ?  Years: 40.00  ?  Pack years: 60.00  ?  Types: Cigarettes  ? Smokeless tobacco: Never  ?Vaping Use  ? Vaping Use: Never used  ?Substance and Sexual Activity  ? Alcohol use: No  ? Drug use: No  ? Sexual activity: Not on file  ?Other Topics Concern  ? Not on file  ?Social History Narrative  ? Not on file  ? ?Social Determinants of Health  ? ?Financial Resource Strain: Not on file  ?Food Insecurity: Not on file  ?Transportation Needs: Not on file  ?Physical Activity: Not on file  ?Stress: Not on file  ?Social Connections: Not on file  ? ? ?Review of Systems ?Per HPI ? ?Objective:  ?BP 135/84   Pulse 77   Temp 97.8 ?F (36.6 ?C)   Wt 146 lb 6.4 oz (66.4 kg)   SpO2 99%   BMI 21.62 kg/m?  ? ?BP/Weight 09/14/2021 09/12/2021 09/10/2021  ?Systolic BP 888 280 034  ?Diastolic BP 84 81 85  ?Wt. (Lbs) 146.4 149 -  ?BMI 21.62 22 -  ? ? ?Physical Exam ?Vitals and nursing note reviewed.  ?Constitutional:   ?   General: He is not in acute distress. ?   Appearance: Normal appearance. He is not ill-appearing.  ?HENT:  ?   Head: Normocephalic and atraumatic.  ?Eyes:  ?   General:     ?   Right eye: No discharge.     ?  Left eye: No discharge.  ?   Conjunctiva/sclera: Conjunctivae normal.  ?Cardiovascular:  ?   Rate and Rhythm: Normal rate and regular rhythm.  ?Pulmonary:  ?   Effort: Pulmonary effort is normal.  ?   Breath sounds: Normal breath sounds.  ?Abdominal:  ?   General: There is no distension.  ?   Palpations: Abdomen is soft.  ?   Tenderness: There is no abdominal tenderness.  ?Neurological:  ?   Mental Status: He is alert.  ?Psychiatric:     ?   Mood and Affect: Mood normal.     ?   Behavior: Behavior normal.  ? ? ?Lab Results  ?Component Value Date  ? WBC 5.6 09/10/2021  ? HGB 10.9 (L) 09/10/2021  ? HCT 35.2 (L) 09/10/2021  ? PLT 385 09/10/2021  ? GLUCOSE 87 09/10/2021  ? CHOL 215 (H) 09/02/2018  ? TRIG 132 09/02/2018  ? HDL 47 09/02/2018  ? LDLCALC 142 (H) 09/02/2018  ? ALT 15 09/07/2021  ? AST 16 09/07/2021  ? NA  135 09/10/2021  ? K 4.4 09/10/2021  ? CL 102 09/10/2021  ? CREATININE 0.99 09/10/2021  ? BUN 14 09/10/2021  ? CO2 23 09/10/2021  ? ? ? ?Assessment & Plan:  ? ?Problem List Items Addressed This Visit   ? ?  ? Digestive  ? Acute pancreatitis  ?  Resolved. Doing well at this time. ?  ?  ? SBO (small bowel obstruction) ([place]) - Primary  ?  Resolved. Doing well.  ?  ?  ?  ? Genitourinary  ? Renal insufficiency  ? Relevant Orders  ? CMP14+EGFR  ?  ? Other  ? Hyperlipidemia  ? Relevant Orders  ? Lipid panel  ? H. pylori infection  ?  Patient almost done with Amox and Levaquin. ?Will need test to confirm eradication after 4 weeks or more after completion of antibiotics. ?  ?  ? ?Other Visit Diagnoses   ? ? Normocytic anemia      ? Relevant Orders  ? CBC  ? Iron, TIBC and Ferritin Panel  ? Vitamin B12  ? Folate  ? Prostate cancer screening      ? Relevant Orders  ? PSA  ? ?  ? ? ?Thersa Salt DO ?[place] ? ?

## 2021-09-14 NOTE — Assessment & Plan Note (Signed)
Patient almost done with Amox and Levaquin. ?Will need test to confirm eradication after 4 weeks or more after completion of antibiotics. ?

## 2021-09-14 NOTE — Assessment & Plan Note (Signed)
Resolved. Doing well at this time. ?

## 2021-09-14 NOTE — Assessment & Plan Note (Signed)
Resolved. Doing well.  ?

## 2021-09-14 NOTE — Patient Instructions (Signed)
Labs ordered. ? ?Call with concerns. ? ?Take care ? ?Dr. Lacinda Axon  ?

## 2021-09-15 DIAGNOSIS — N289 Disorder of kidney and ureter, unspecified: Secondary | ICD-10-CM | POA: Diagnosis not present

## 2021-09-15 DIAGNOSIS — Z125 Encounter for screening for malignant neoplasm of prostate: Secondary | ICD-10-CM | POA: Diagnosis not present

## 2021-09-15 DIAGNOSIS — D649 Anemia, unspecified: Secondary | ICD-10-CM | POA: Diagnosis not present

## 2021-09-15 DIAGNOSIS — E785 Hyperlipidemia, unspecified: Secondary | ICD-10-CM | POA: Diagnosis not present

## 2021-09-16 LAB — CMP14+EGFR
ALT: 22 IU/L (ref 0–44)
AST: 30 IU/L (ref 0–40)
Albumin/Globulin Ratio: 1.6 (ref 1.2–2.2)
Albumin: 4.2 g/dL (ref 3.8–4.8)
Alkaline Phosphatase: 128 IU/L — ABNORMAL HIGH (ref 44–121)
BUN/Creatinine Ratio: 10 (ref 10–24)
BUN: 12 mg/dL (ref 8–27)
Bilirubin Total: 0.2 mg/dL (ref 0.0–1.2)
CO2: 26 mmol/L (ref 20–29)
Calcium: 10.2 mg/dL (ref 8.6–10.2)
Chloride: 95 mmol/L — ABNORMAL LOW (ref 96–106)
Creatinine, Ser: 1.16 mg/dL (ref 0.76–1.27)
Globulin, Total: 2.6 g/dL (ref 1.5–4.5)
Glucose: 92 mg/dL (ref 70–99)
Potassium: 5.7 mmol/L — ABNORMAL HIGH (ref 3.5–5.2)
Sodium: 136 mmol/L (ref 134–144)
Total Protein: 6.8 g/dL (ref 6.0–8.5)
eGFR: 70 mL/min/{1.73_m2} (ref >59)

## 2021-09-16 LAB — IRON,TIBC AND FERRITIN PANEL
Ferritin: 216 ng/mL (ref 30–400)
Iron Saturation: 14 % — ABNORMAL LOW (ref 15–55)
Iron: 42 ug/dL (ref 38–169)
Total Iron Binding Capacity: 297 ug/dL (ref 250–450)
UIBC: 255 ug/dL (ref 111–343)

## 2021-09-16 LAB — CBC
Hematocrit: 37.7 % (ref 37.5–51.0)
Hemoglobin: 12.9 g/dL — ABNORMAL LOW (ref 13.0–17.7)
MCH: 30.4 pg (ref 26.6–33.0)
MCHC: 34.2 g/dL (ref 31.5–35.7)
MCV: 89 fL (ref 79–97)
Platelets: 469 10*3/uL — ABNORMAL HIGH (ref 150–450)
RBC: 4.25 x10E6/uL (ref 4.14–5.80)
RDW: 12 % (ref 11.6–15.4)
WBC: 6.8 10*3/uL (ref 3.4–10.8)

## 2021-09-16 LAB — LIPID PANEL
Chol/HDL Ratio: 4.6 ratio (ref 0.0–5.0)
Cholesterol, Total: 180 mg/dL (ref 100–199)
HDL: 39 mg/dL — ABNORMAL LOW (ref >39)
LDL Chol Calc (NIH): 99 mg/dL (ref 0–99)
Triglycerides: 247 mg/dL — ABNORMAL HIGH (ref 0–149)
VLDL Cholesterol Cal: 42 mg/dL — ABNORMAL HIGH (ref 5–40)

## 2021-09-16 LAB — VITAMIN B12: Vitamin B-12: 324 pg/mL (ref 232–1245)

## 2021-09-16 LAB — PSA: Prostate Specific Ag, Serum: 0.6 ng/mL (ref 0.0–4.0)

## 2021-09-16 LAB — FOLATE: Folate: 7.7 ng/mL (ref >3.0)

## 2021-09-26 ENCOUNTER — Ambulatory Visit: Payer: BC Managed Care – PPO | Admitting: Family Medicine

## 2021-10-04 ENCOUNTER — Telehealth: Payer: Self-pay | Admitting: *Deleted

## 2021-10-04 MED ORDER — NICOTINE 21 MG/24HR TD PT24: 21.0000 mg | MEDICATED_PATCH | Freq: Every day | TRANSDERMAL | 0 refills | Status: DC | PRN

## 2021-10-04 NOTE — Telephone Encounter (Signed)
Prescription sent electronically to pharmacy. Patient notified. 

## 2021-10-04 NOTE — Telephone Encounter (Signed)
Coral Spikes, DO   ? ?Okay to refill. Thank you   ? ?

## 2021-10-04 NOTE — Telephone Encounter (Signed)
Patient was started on Nicotine patches while in hospital and has done well on them and wondered if he could get a refill to [facility] on [address] ?

## 2021-10-19 ENCOUNTER — Ambulatory Visit: Payer: BC Managed Care – PPO | Admitting: Family Medicine

## 2021-10-19 VITALS — BP 130/76 | HR 71 | Temp 97.8°F | Ht 69.0 in | Wt 148.2 lb

## 2021-10-19 DIAGNOSIS — K279 Peptic ulcer, site unspecified, unspecified as acute or chronic, without hemorrhage or perforation: Secondary | ICD-10-CM | POA: Diagnosis not present

## 2021-10-19 DIAGNOSIS — A048 Other specified bacterial intestinal infections: Secondary | ICD-10-CM | POA: Diagnosis not present

## 2021-10-19 DIAGNOSIS — Z8719 Personal history of other diseases of the digestive system: Secondary | ICD-10-CM | POA: Insufficient documentation

## 2021-10-19 NOTE — Patient Instructions (Signed)
Go to the lab and they will tell you what to do regarding the testing. ? ?No Aspirin. ? ?Follow up in 6 months. ? ?Take care ? ?Dr. Lacinda Axon  ?

## 2021-10-19 NOTE — Progress Notes (Signed)
? ?Subjective:  ?Patient ID: Michael Brady, male    DOB: [birth year]  Age: 66 y.o. MRN: [id number] ? ?CC: ?Chief Complaint  ?Patient presents with  ? Follow-up  ?  On SBO  ? ? ?HPI: ? ?66 year old male presents for follow-up. ? ?Patient is doing well at this time.  He still has some occasional abdominal discomfort but overall is doing well.  Diet has returned to normal.  Was recently treated for H. pylori (see recent hospital admissions).  He has completed the course of antibiotics.  He has been off PPI for few weeks.  Needs repeat H. pylori testing.  He has made progress regarding his smoking cessation.  He is only smoking a few cigarettes a day.  He states that a pack will last him 2 to 3 weeks.   ? ?Patient Active Problem List  ? Diagnosis Date Noted  ? History of small bowel obstruction 10/19/2021  ? PUD (peptic ulcer disease) 10/19/2021  ? Renal insufficiency 09/14/2021  ? H. pylori infection 09/07/2021  ? 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 Severe Diverticulosis 08/26/2021  ? Smoker 05/31/2020  ? 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 Hyperlipidemia 04/03/2017  ? ? ?Social Hx   ?Social History  ? ?Socioeconomic History  ? Marital status: Married  ?  Spouse name: Not on file  ? Number of children: Not on file  ? Years of education: Not on file  ? Highest education level: Not on file  ?Occupational History  ? Not on file  ?Tobacco Use  ? Smoking status: Every Day  ?  Packs/day: 1.50  ?  Years: 40.00  ?  Pack years: 60.00  ?  Types: Cigarettes  ? Smokeless tobacco: Never  ?Vaping Use  ? Vaping Use: Never used  ?Substance and Sexual Activity  ? Alcohol use: No  ? Drug use: No  ? Sexual activity: Not on file  ?Other Topics Concern  ? Not on file  ?Social History Narrative  ? Not on file  ? ?Social Determinants of Health  ? ?Financial Resource Strain: Not on file  ?Food Insecurity: Not on file  ?Transportation Needs: Not on file  ?Physical Activity: Not on file  ?Stress: Not on file   ?Social Connections: Not on file  ? ? ?Review of Systems  ?Constitutional: Negative.   ?Gastrointestinal:  Negative for constipation, nausea and vomiting.  ? ?Objective:  ?BP 130/76   Pulse 71   Temp 97.8 ?F (36.6 ?C) (Oral)   Ht '5\' 9"'$  (1.753 m)   Wt 148 lb 3.2 oz (67.2 kg)   SpO2 99%   BMI 21.89 kg/m?  ? ? ?  10/19/2021  ?  8:18 AM 09/14/2021  ?  9:20 AM 09/12/2021  ?  1:41 PM  ?BP/Weight  ?Systolic BP 967 591 638  ?Diastolic BP 76 84 81  ?Wt. (Lbs) 148.2 146.4 149  ?BMI 21.89 kg/m2 21.62 kg/m2 22 kg/m2  ? ? ?Physical Exam ?Constitutional:   ?   General: He is not in acute distress. ?HENT:  ?   Head: Normocephalic and atraumatic.  ?Cardiovascular:  ?   Rate and Rhythm: Normal rate and regular rhythm.  ?Pulmonary:  ?   Effort: Pulmonary effort is normal.  ?   Breath sounds: Normal breath sounds. No wheezing or rales.  ?Abdominal:  ?   General: There is no distension.  ?   Palpations: Abdomen is soft.  ?   Tenderness: There is no abdominal tenderness.  ?  Neurological:  ?   Mental Status: He is alert.  ? ? ?Lab Results  ?Component Value Date  ? WBC 6.8 09/15/2021  ? HGB 12.9 (L) 09/15/2021  ? HCT 37.7 09/15/2021  ? PLT 469 (H) 09/15/2021  ? GLUCOSE 92 09/15/2021  ? CHOL 180 09/15/2021  ? TRIG 247 (H) 09/15/2021  ? HDL 39 (L) 09/15/2021  ? [place] 99 09/15/2021  ? ALT 22 09/15/2021  ? AST 30 09/15/2021  ? NA 136 09/15/2021  ? K 5.7 (H) 09/15/2021  ? CL 95 (L) 09/15/2021  ? CREATININE 1.16 09/15/2021  ? BUN 12 09/15/2021  ? CO2 26 09/15/2021  ? ? ? ?Assessment & Plan:  ? ?Problem List Items Addressed This Visit   ? ?  ? Digestive  ? PUD (peptic ulcer disease)  ?  History of gastroduodenal ulcer with perforation.  Patient currently off PPI for H. pylori testing. Recommend resuming after testing is complete. ?  ?  ?  ? Other  ? H. pylori infection - Primary  ?  Ordered stool testing for test of cure. ?  ?  ? Relevant Orders  ? H. pylori antigen, stool  ? ?Follow-up:  Return in about 6 months (around 04/20/2022). ? ?Thersa Salt DO ?[place] ? ?

## 2021-10-19 NOTE — Assessment & Plan Note (Signed)
History of gastroduodenal ulcer with perforation.  Patient currently off PPI for H. pylori testing. Recommend resuming after testing is complete. ?

## 2021-10-19 NOTE — Assessment & Plan Note (Signed)
Ordered stool testing for test of cure. ?

## 2021-10-23 DIAGNOSIS — A048 Other specified bacterial intestinal infections: Secondary | ICD-10-CM | POA: Diagnosis not present

## 2021-10-25 LAB — H. PYLORI ANTIGEN, STOOL: H pylori Ag, Stl: NEGATIVE

## 2021-11-02 ENCOUNTER — Other Ambulatory Visit: Payer: Self-pay

## 2021-11-02 MED ORDER — NICOTINE 21 MG/24HR TD PT24: 21.0000 mg | MEDICATED_PATCH | Freq: Every day | TRANSDERMAL | 0 refills | Status: DC | PRN

## 2021-11-02 NOTE — Telephone Encounter (Signed)
Encourage patient to contact the pharmacy for refills or they can request refills through [facility] ? ?(Please schedule appointment if patient has not been seen in over a year) ? ? ? ?WHAT PHARMACY WOULD THEY LIKE THIS SENT TO: [facility] [facility] [place] [place] [address] [facility] [address]  ? ?MEDICATION NAME & DOSE:nicotine (NICODERM CQ - DOSED IN MG/24 HOURS) 21 mg/24hr patch  ? ?NOTES/COMMENTS FROM PATIENT: ? ? ? ? ? ?[place] office please notify patient: ?It takes 48-72 hours to process rx refill requests ?Ask patient to call pharmacy to ensure rx is ready before heading there.  ? ?

## 2021-11-02 NOTE — Telephone Encounter (Signed)
Patient notified

## 2021-11-25 ENCOUNTER — Other Ambulatory Visit: Payer: Self-pay | Admitting: Family Medicine

## 2021-11-25 DIAGNOSIS — F321 Major depressive disorder, single episode, moderate: Secondary | ICD-10-CM

## 2021-11-29 ENCOUNTER — Telehealth: Payer: Self-pay | Admitting: Family Medicine

## 2021-11-29 MED ORDER — NICOTINE 21 MG/24HR TD PT24: 21.0000 mg | MEDICATED_PATCH | Freq: Every day | TRANSDERMAL | 0 refills | Status: DC | PRN

## 2021-11-29 NOTE — Telephone Encounter (Signed)
Pt requesting refill on Nicotine patches. Pt states he has 2 left. Has appt for 6/10. [facility]. Please advise. Thank you.

## 2021-11-29 NOTE — Telephone Encounter (Signed)
Refill sent and pt is aware

## 2021-11-29 NOTE — Addendum Note (Signed)
Addended by: Vicente Males on: 11/29/2021 10:58 AM   Modules accepted: Orders

## 2021-12-14 ENCOUNTER — Ambulatory Visit (INDEPENDENT_AMBULATORY_CARE_PROVIDER_SITE_OTHER): Payer: BC Managed Care – PPO | Admitting: Family Medicine

## 2021-12-14 VITALS — BP 134/71 | HR 59 | Temp 97.2°F | Ht 69.0 in | Wt 153.0 lb

## 2021-12-14 DIAGNOSIS — N289 Disorder of kidney and ureter, unspecified: Secondary | ICD-10-CM

## 2021-12-14 DIAGNOSIS — F321 Major depressive disorder, single episode, moderate: Secondary | ICD-10-CM

## 2021-12-14 DIAGNOSIS — K279 Peptic ulcer, site unspecified, unspecified as acute or chronic, without hemorrhage or perforation: Secondary | ICD-10-CM

## 2021-12-14 DIAGNOSIS — D649 Anemia, unspecified: Secondary | ICD-10-CM | POA: Diagnosis not present

## 2021-12-14 DIAGNOSIS — E785 Hyperlipidemia, unspecified: Secondary | ICD-10-CM | POA: Diagnosis not present

## 2021-12-14 DIAGNOSIS — R748 Abnormal levels of other serum enzymes: Secondary | ICD-10-CM

## 2021-12-14 DIAGNOSIS — F17200 Nicotine dependence, unspecified, uncomplicated: Secondary | ICD-10-CM

## 2021-12-14 NOTE — Patient Instructions (Signed)
Labs today.  Follow up in 6 months. 

## 2021-12-15 DIAGNOSIS — R748 Abnormal levels of other serum enzymes: Secondary | ICD-10-CM | POA: Diagnosis not present

## 2021-12-15 DIAGNOSIS — N289 Disorder of kidney and ureter, unspecified: Secondary | ICD-10-CM | POA: Diagnosis not present

## 2021-12-15 DIAGNOSIS — D649 Anemia, unspecified: Secondary | ICD-10-CM | POA: Diagnosis not present

## 2021-12-15 DIAGNOSIS — E785 Hyperlipidemia, unspecified: Secondary | ICD-10-CM | POA: Diagnosis not present

## 2021-12-15 NOTE — Assessment & Plan Note (Signed)
Lipid panel today

## 2021-12-15 NOTE — Assessment & Plan Note (Signed)
Recommended smoking cessation. 

## 2021-12-15 NOTE — Assessment & Plan Note (Signed)
Stable ?Continue Celexa ?

## 2021-12-15 NOTE — Assessment & Plan Note (Signed)
Doing well at this time.  No symptoms.

## 2021-12-15 NOTE — Progress Notes (Signed)
Subjective:  Patient ID: Michael Brady, male    DOB: [birth year]  Age: 66 y.o. MRN: [id number]  CC: Follow-up  HPI:  66 year old male with a history of peptic ulcer disease, history of cancer with renal pelvis, history of bowel obstruction and H. pylori, hyperlipidemia, tobacco abuse presents for follow-up.  Patient states that he is essentially back to his normal self.  He had quit smoking but has now returned to smoking approximately 2 cigarettes a day.  He states that he is feeling well.  No abdominal pain.  Normal appetite.  Needs repeat labs today.  Depression is stable on Celexa.  Patient Active Problem List   Diagnosis Date Noted   History of small bowel obstruction 10/19/2021   PUD (peptic ulcer disease) 10/19/2021   Renal insufficiency 09/14/2021   H. pylori infection 09/07/2021   History of renal pelvis cancer 08/26/2021   Severe Diverticulosis 08/26/2021   Smoker 05/31/2020   Depression, major, single episode, moderate ([place]) 11/21/2017   Hyperlipidemia 04/03/2017    Social Hx   Social History   Socioeconomic History   Marital status: Married    Spouse name: Not on file   Number of children: Not on file   Years of education: Not on file   Highest education level: Not on file  Occupational History   Not on file  Tobacco Use   Smoking status: Every Day    Packs/day: 1.50    Years: 40.00    Total pack years: 60.00    Types: Cigarettes   Smokeless tobacco: Never  Vaping Use   Vaping Use: Never used  Substance and Sexual Activity   Alcohol use: No   Drug use: No   Sexual activity: Not on file  Other Topics Concern   Not on file  Social History Narrative   Not on file   Social Determinants of Health   Financial Resource Strain: Not on file  Food Insecurity: Not on file  Transportation Needs: Not on file  Physical Activity: Not on file  Stress: Not on file  Social Connections: Not on file    Review of Systems Per HPI  Objective:  BP 134/71    Pulse (!) 59   Temp (!) 97.2 F (36.2 C)   Ht '5\' 9"'  (1.753 m)   Wt 153 lb (69.4 kg)   SpO2 97%   BMI 22.59 kg/m      12/14/2021   10:30 AM 10/19/2021    8:18 AM 09/14/2021    9:20 AM  BP/Weight  Systolic BP 160 109 323  Diastolic BP 71 76 84  Wt. (Lbs) 153 148.2 146.4  BMI 22.59 kg/m2 21.89 kg/m2 21.62 kg/m2    Physical Exam Vitals and nursing note reviewed.  Constitutional:      General: He is not in acute distress.    Appearance: Normal appearance.  HENT:     Head: Normocephalic and atraumatic.  Cardiovascular:     Rate and Rhythm: Normal rate and regular rhythm.  Pulmonary:     Effort: Pulmonary effort is normal.     Breath sounds: Normal breath sounds. No wheezing, rhonchi or rales.  Abdominal:     General: There is no distension.     Palpations: Abdomen is soft.     Tenderness: There is no abdominal tenderness.  Neurological:     Mental Status: He is alert.  Psychiatric:        Mood and Affect: Mood normal.  Behavior: Behavior normal.     Lab Results  Component Value Date   WBC 6.8 09/15/2021   HGB 12.9 (L) 09/15/2021   HCT 37.7 09/15/2021   PLT 469 (H) 09/15/2021   GLUCOSE 92 09/15/2021   CHOL 180 09/15/2021   TRIG 247 (H) 09/15/2021   HDL 39 (L) 09/15/2021   LDLCALC 99 09/15/2021   ALT 22 09/15/2021   AST 30 09/15/2021   NA 136 09/15/2021   K 5.7 (H) 09/15/2021   CL 95 (L) 09/15/2021   CREATININE 1.16 09/15/2021   BUN 12 09/15/2021   CO2 26 09/15/2021     Assessment & Plan:   Problem List Items Addressed This Visit       Digestive   PUD (peptic ulcer disease)    Doing well at this time.  No symptoms.        Genitourinary   Renal insufficiency   Relevant Orders   CMP14+EGFR     Other   Depression, major, single episode, moderate (HCC)    Stable.  Continue Celexa.      Hyperlipidemia - Primary    Lipid panel today.      Relevant Orders   Lipid Panel   Smoker    Recommended smoking cessation.      Other Visit  Diagnoses     Anemia, unspecified type       Relevant Orders   CBC   Elevated alkaline phosphatase level       Relevant Orders   Gamma GT      Follow-up:  6 months  [place] DO [place]

## 2021-12-16 LAB — CMP14+EGFR
ALT: 10 IU/L (ref 0–44)
AST: 15 IU/L (ref 0–40)
Albumin/Globulin Ratio: 2 (ref 1.2–2.2)
Albumin: 4.8 g/dL (ref 3.8–4.8)
Alkaline Phosphatase: 82 IU/L (ref 44–121)
BUN/Creatinine Ratio: 16 (ref 10–24)
BUN: 19 mg/dL (ref 8–27)
Bilirubin Total: 0.2 mg/dL (ref 0.0–1.2)
CO2: 26 mmol/L (ref 20–29)
Calcium: 9.6 mg/dL (ref 8.6–10.2)
Chloride: 99 mmol/L (ref 96–106)
Creatinine, Ser: 1.19 mg/dL (ref 0.76–1.27)
Globulin, Total: 2.4 g/dL (ref 1.5–4.5)
Glucose: 99 mg/dL (ref 70–99)
Potassium: 4.4 mmol/L (ref 3.5–5.2)
Sodium: 138 mmol/L (ref 134–144)
Total Protein: 7.2 g/dL (ref 6.0–8.5)
eGFR: 68 mL/min/{1.73_m2} (ref >59)

## 2021-12-16 LAB — CBC
Hematocrit: 38.6 % (ref 37.5–51.0)
Hemoglobin: 13 g/dL (ref 13.0–17.7)
MCH: 29.7 pg (ref 26.6–33.0)
MCHC: 33.7 g/dL (ref 31.5–35.7)
MCV: 88 fL (ref 79–97)
Platelets: 295 10*3/uL (ref 150–450)
RBC: 4.37 x10E6/uL (ref 4.14–5.80)
RDW: 13.8 % (ref 11.6–15.4)
WBC: 7.4 10*3/uL (ref 3.4–10.8)

## 2021-12-16 LAB — LIPID PANEL
Chol/HDL Ratio: 4 ratio (ref 0.0–5.0)
Cholesterol, Total: 186 mg/dL (ref 100–199)
HDL: 46 mg/dL (ref >39)
LDL Chol Calc (NIH): 111 mg/dL — ABNORMAL HIGH (ref 0–99)
Triglycerides: 164 mg/dL — ABNORMAL HIGH (ref 0–149)
VLDL Cholesterol Cal: 29 mg/dL (ref 5–40)

## 2021-12-16 LAB — GAMMA GT: GGT: 17 IU/L (ref 0–65)

## 2021-12-18 NOTE — Progress Notes (Signed)
Left message to return call 

## 2021-12-29 ENCOUNTER — Other Ambulatory Visit: Payer: Self-pay | Admitting: *Deleted

## 2021-12-29 MED ORDER — NICOTINE 21 MG/24HR TD PT24: 21.0000 mg | MEDICATED_PATCH | Freq: Every day | TRANSDERMAL | 0 refills | Status: DC | PRN

## 2022-01-24 ENCOUNTER — Other Ambulatory Visit: Payer: Self-pay

## 2022-01-24 MED ORDER — NICOTINE 21 MG/24HR TD PT24: 21.0000 mg | MEDICATED_PATCH | Freq: Every day | TRANSDERMAL | 0 refills | Status: DC | PRN

## 2022-02-01 ENCOUNTER — Ambulatory Visit (HOSPITAL_COMMUNITY)
Admission: RE | Admit: 2022-02-01 | Discharge: 2022-02-01 | Disposition: A | Discharge status: Home / Self Care | Payer: BC Managed Care – PPO | Source: Ambulatory Visit | Attending: Family Medicine | Admitting: Family Medicine

## 2022-02-01 ENCOUNTER — Ambulatory Visit: Payer: BC Managed Care – PPO | Admitting: Family Medicine

## 2022-02-01 ENCOUNTER — Other Ambulatory Visit (HOSPITAL_COMMUNITY)
Admission: RE | Admit: 2022-02-01 | Discharge: 2022-02-01 | Disposition: A | Discharge status: Home / Self Care | Payer: BC Managed Care – PPO | Source: Ambulatory Visit | Attending: Family Medicine | Admitting: Family Medicine

## 2022-02-01 VITALS — BP 143/89 | HR 56 | Temp 97.6°F | Ht 69.0 in | Wt 150.4 lb

## 2022-02-01 DIAGNOSIS — R1011 Right upper quadrant pain: Secondary | ICD-10-CM | POA: Insufficient documentation

## 2022-02-01 LAB — LIPASE, BLOOD: Lipase: 48 U/L (ref 11–51)

## 2022-02-01 LAB — COMPREHENSIVE METABOLIC PANEL
ALT: 15 U/L (ref 0–44)
AST: 20 U/L (ref 15–41)
Albumin: 4.4 g/dL (ref 3.5–5.0)
Alkaline Phosphatase: 64 U/L (ref 38–126)
Anion gap: 7 (ref 5–15)
BUN: 16 mg/dL (ref 8–23)
CO2: 28 mmol/L (ref 22–32)
Calcium: 9.3 mg/dL (ref 8.9–10.3)
Chloride: 101 mmol/L (ref 98–111)
Creatinine, Ser: 1.25 mg/dL — ABNORMAL HIGH (ref 0.61–1.24)
GFR, Estimated: >60 mL/min (ref >60)
Glucose, Bld: 108 mg/dL — ABNORMAL HIGH (ref 70–99)
Potassium: 4.9 mmol/L (ref 3.5–5.1)
Sodium: 136 mmol/L (ref 135–145)
Total Bilirubin: 0.8 mg/dL (ref 0.3–1.2)
Total Protein: 7.5 g/dL (ref 6.5–8.1)

## 2022-02-01 LAB — CBC
HCT: 42.2 % (ref 39.0–52.0)
Hemoglobin: 13.8 g/dL (ref 13.0–17.0)
MCH: 29.9 pg (ref 26.0–34.0)
MCHC: 32.7 g/dL (ref 30.0–36.0)
MCV: 91.5 fL (ref 80.0–100.0)
Platelets: 237 10*3/uL (ref 150–400)
RBC: 4.61 MIL/uL (ref 4.22–5.81)
RDW: 14.3 % (ref 11.5–15.5)
WBC: 7.2 10*3/uL (ref 4.0–10.5)
nRBC: 0 % (ref 0.0–0.2)

## 2022-02-01 MED ORDER — PANTOPRAZOLE SODIUM 40 MG PO TBEC: 40.0000 mg | DELAYED_RELEASE_TABLET | Freq: Every day | ORAL | 1 refills | Status: DC

## 2022-02-01 NOTE — Assessment & Plan Note (Signed)
Stat labs obtained today and revealed mild elevated creatinine.  Normal LFTs.  Normal lipase. Right upper quadrant ultrasound was obtained and revealed no evidence of cholelithiasis or cholecystitis.  It did show Prominent common bile duct near the pancreatic head measuring up to 9 mm.  Correlation was recommended with LFTs.  He has normal LFTs.  I do not feel that MRCP is warranted at this time given his unremarkable labs.  Protonix as directed.  Patient is to let me know if symptoms continue to persist.

## 2022-02-01 NOTE — Progress Notes (Signed)
Subjective:  Patient ID: Michael Brady, male    DOB: 04/03/1956  Age: 66 y.o. MRN: [id number]  CC: Chief Complaint  Patient presents with   Abdominal Pain    Woke up with a cramp in his stomach on Monday morning.    HPI:  66 year old male with a history of peptic ulcer disease and H. Pylori (prior hospitalization for perforation and subsequent bowel obstruction and pancreatitis), history of renal pelvis cancer, history of renal insufficiency, tobacco abuse, hyperlipidemia presents with complaints of upper abdominal pain.  Patient reports epigastric to right upper quadrant pain.  Started on Monday.  Felt initially like a cramp but has continued.  Given his history he thought it be best that he come in for evaluation.  No apparent relation to food.  No fever.  He has had no vomiting.  No relieving factors.  Worse with certain movements.  No other complaints or concerns at this time.  Patient Active Problem List   Diagnosis Date Noted   RUQ pain 02/01/2022   History of small bowel obstruction 10/19/2021   PUD (peptic ulcer disease) 10/19/2021   Renal insufficiency 09/14/2021   H. pylori infection 09/07/2021   History of renal pelvis cancer 08/26/2021   Severe Diverticulosis 08/26/2021   Smoker 05/31/2020   Depression, major, single episode, moderate ([place]) 11/21/2017   Hyperlipidemia 04/03/2017    Social Hx   Social History   Socioeconomic History   Marital status: Married    Spouse name: Not on file   Number of children: Not on file   Years of education: Not on file   Highest education level: Not on file  Occupational History   Not on file  Tobacco Use   Smoking status: Every Day    Packs/day: 1.50    Years: 40.00    Total pack years: 60.00    Types: Cigarettes   Smokeless tobacco: Never  Vaping Use   Vaping Use: Never used  Substance and Sexual Activity   Alcohol use: No   Drug use: No   Sexual activity: Not on file  Other Topics Concern   Not on file  Social  History Narrative   Not on file   Social Determinants of Health   Financial Resource Strain: Not on file  Food Insecurity: Not on file  Transportation Needs: Not on file  Physical Activity: Not on file  Stress: Not on file  Social Connections: Not on file    Review of Systems Per HPI  Objective:  BP (!) 143/89   Pulse (!) 56   Temp 97.6 F (36.4 C) (Oral)   Ht '5\' 9"'$  (1.753 m)   Wt 150 lb 6.4 oz (68.2 kg)   SpO2 97%   BMI 22.21 kg/m      02/01/2022    8:22 AM 12/14/2021   10:30 AM 10/19/2021    8:18 AM  BP/Weight  Systolic BP 003 491 791  Diastolic BP 89 71 76  Wt. (Lbs) 150.4 153 148.2  BMI 22.21 kg/m2 22.59 kg/m2 21.89 kg/m2    Physical Exam Vitals and nursing note reviewed.  Constitutional:      General: He is not in acute distress.    Appearance: He is not ill-appearing.  HENT:     Head: Normocephalic and atraumatic.  Eyes:     General:        Right eye: No discharge.        Left eye: No discharge.     Conjunctiva/sclera: Conjunctivae normal.  Cardiovascular:     Rate and Rhythm: Normal rate and regular rhythm.  Pulmonary:     Effort: Pulmonary effort is normal.     Breath sounds: Normal breath sounds. No wheezing, rhonchi or rales.  Abdominal:     General: There is no distension.     Palpations: Abdomen is soft.     Comments: Mild tenderness in the epigastrium.  Neurological:     Mental Status: He is alert.  Psychiatric:        Mood and Affect: Mood normal.        Behavior: Behavior normal.     Lab Results  Component Value Date   WBC 7.2 02/01/2022   HGB 13.8 02/01/2022   HCT 42.2 02/01/2022   PLT 237 02/01/2022   GLUCOSE 108 (H) 02/01/2022   CHOL 186 12/15/2021   TRIG 164 (H) 12/15/2021   HDL 46 12/15/2021   LDLCALC 111 (H) 12/15/2021   ALT 15 02/01/2022   AST 20 02/01/2022   NA 136 02/01/2022   K 4.9 02/01/2022   CL 101 02/01/2022   CREATININE 1.25 (H) 02/01/2022   BUN 16 02/01/2022   CO2 28 02/01/2022     Assessment & Plan:    Problem List Items Addressed This Visit       Other   RUQ pain - Primary    Stat labs obtained today and revealed mild elevated creatinine.  Normal LFTs.  Normal lipase. Right upper quadrant ultrasound was obtained and revealed no evidence of cholelithiasis or cholecystitis.  It did show Prominent common bile duct near the pancreatic head measuring up to 9 mm.  Correlation was recommended with LFTs.  He has normal LFTs.  I do not feel that MRCP is warranted at this time given his unremarkable labs.  Protonix as directed.  Patient is to let me know if symptoms continue to persist.      Relevant Orders   US Abdomen Limited RUQ (LIVER/GB) (Completed)   CBC   Comprehensive metabolic panel   Lipase    Meds ordered this encounter  Medications   pantoprazole (PROTONIX) 40 MG tablet    Sig: Take 1 tablet (40 mg total) by mouth daily.    Dispense:  90 tablet    Refill:  [place]

## 2022-02-23 ENCOUNTER — Other Ambulatory Visit: Payer: Self-pay | Admitting: Family Medicine

## 2022-02-23 MED ORDER — NICOTINE 21 MG/24HR TD PT24: 21.0000 mg | MEDICATED_PATCH | Freq: Every day | TRANSDERMAL | 0 refills | Status: DC | PRN

## 2022-03-01 ENCOUNTER — Other Ambulatory Visit: Payer: Self-pay | Admitting: *Deleted

## 2022-03-01 DIAGNOSIS — F321 Major depressive disorder, single episode, moderate: Secondary | ICD-10-CM

## 2022-03-01 MED ORDER — CITALOPRAM HYDROBROMIDE 20 MG PO TABS: 20.0000 mg | ORAL_TABLET | Freq: Every day | ORAL | 0 refills | Status: DC

## 2022-03-15 ENCOUNTER — Ambulatory Visit: Payer: BC Managed Care – PPO | Admitting: Nurse Practitioner

## 2022-03-15 ENCOUNTER — Encounter: Payer: Self-pay | Admitting: Nurse Practitioner

## 2022-03-15 VITALS — BP 160/93 | HR 58 | Temp 97.2°F | Wt 153.4 lb

## 2022-03-15 DIAGNOSIS — L247 Irritant contact dermatitis due to plants, except food: Secondary | ICD-10-CM

## 2022-03-15 MED ORDER — CLOBETASOL PROPIONATE 0.05 % EX CREA: 1.0000 | TOPICAL_CREAM | Freq: Two times a day (BID) | CUTANEOUS | 0 refills | Status: DC

## 2022-03-15 MED ORDER — PREDNISONE 20 MG PO TABS: 20.0000 mg | ORAL_TABLET | Freq: Every day | ORAL | 0 refills | Status: AC

## 2022-03-15 NOTE — Progress Notes (Signed)
   Subjective:    Patient ID: Michael Brady, male    DOB: 1956/02/12, 66 y.o.   MRN: [id number]  HPI Pt arrives today due to poison oak on right arm/hand, behind right ear and around both eyes x6 days. Pt states he contracted poison oak on Friday but believes it may have spread on Sunday. Pt has been using Peroxide. Patient denies that the areas itch, however, when he touches them they burn. Patient denies continued pain.    Review of Systems  Skin:  Positive for rash.       Objective:   Physical Exam Constitutional:      General: He is not in acute distress.    Appearance: Normal appearance. He is normal weight. He is not ill-appearing, toxic-appearing or diaphoretic.  Eyes:     General: No scleral icterus.       Right eye: No discharge.        Left eye: No discharge.     Extraocular Movements: Extraocular movements intact.     Conjunctiva/sclera: Conjunctivae normal.     Pupils: Pupils are equal, round, and reactive to light.     Comments: No redness noted to eyes or pain noted to globe.  Skin:    General: Skin is warm.     Findings: Lesion and rash present.     Comments: Vesicular macular confluent rash noted to bilateral temple areas near eyes, right palm of hands, in between right fingers, and behind right ear. No swelling, erythema, or discharge noted.  Neurological:     Mental Status: He is alert.  Psychiatric:        Mood and Affect: Mood normal.        Behavior: Behavior normal.           Assessment & Plan:   1. Contact dermatitis - Likely dermatitis secondary to poison oak or other plant -Shingles also considered. However, lesions do not follow dermatomes. Will treat as contact dermatitis at this time and have patient follow up if rash worsens. - predniSONE (DELTASONE) 20 MG tablet; Take 1 tablet (20 mg total) by mouth daily with breakfast for 7 days.  Dispense: 7 tablet; Refill: 0 - Discussed side effects of prednisone. Patient recently diagnosed with PUD. Low  dose prednisone prescribed to prevent irritation of GI - clobetasol cream (TEMOVATE) 0.05 %; Apply 1 Application topically 2 (two) times daily.  Dispense: 30 g; Refill: 0 - May use over the counter zyrtec if needed for itchiness - If symptoms not better in 2-3 days, return to clinic to be evaluated. - If symptoms worsen over the weekend with worsening pain or more lesions go to Urgent Care for evaluation and possible Shingles treatment.

## 2022-03-27 ENCOUNTER — Telehealth: Payer: Self-pay

## 2022-03-27 MED ORDER — NICOTINE 21 MG/24HR TD PT24: 21.0000 mg | MEDICATED_PATCH | Freq: Every day | TRANSDERMAL | 1 refills | Status: DC | PRN

## 2022-04-19 ENCOUNTER — Other Ambulatory Visit: Payer: Self-pay

## 2022-04-19 ENCOUNTER — Telehealth: Payer: Self-pay | Admitting: Family Medicine

## 2022-04-19 MED ORDER — NICOTINE 21 MG/24HR TD PT24: 21.0000 mg | MEDICATED_PATCH | Freq: Every day | TRANSDERMAL | 1 refills | Status: DC | PRN

## 2022-04-19 NOTE — Telephone Encounter (Signed)
Nicotine patches called into [facility] per pt request via message on voicemail. Attempted to contact patient but had to leave message to return call.

## 2022-05-29 ENCOUNTER — Other Ambulatory Visit: Payer: Self-pay | Admitting: *Deleted

## 2022-05-29 MED ORDER — NICOTINE 21 MG/24HR TD PT24: 21.0000 mg | MEDICATED_PATCH | Freq: Every day | TRANSDERMAL | 0 refills | Status: DC | PRN

## 2022-06-01 ENCOUNTER — Other Ambulatory Visit: Payer: Self-pay | Admitting: Family Medicine

## 2022-06-01 DIAGNOSIS — F321 Major depressive disorder, single episode, moderate: Secondary | ICD-10-CM

## 2022-06-15 ENCOUNTER — Encounter: Payer: Self-pay | Admitting: Family Medicine

## 2022-06-15 ENCOUNTER — Ambulatory Visit: Payer: BC Managed Care – PPO | Admitting: Family Medicine

## 2022-06-15 DIAGNOSIS — F17200 Nicotine dependence, unspecified, uncomplicated: Secondary | ICD-10-CM

## 2022-06-15 DIAGNOSIS — K279 Peptic ulcer, site unspecified, unspecified as acute or chronic, without hemorrhage or perforation: Secondary | ICD-10-CM

## 2022-06-15 NOTE — Patient Instructions (Signed)
CT ordered. We will be in contact.  Try and quit smoking. Consider Wellbutrin and/or Chantix.  Follow up in 6 months.  Take care  Dr. Lacinda Axon

## 2022-06-15 NOTE — Assessment & Plan Note (Signed)
Stable ?Continue Protonix ?

## 2022-06-15 NOTE — Assessment & Plan Note (Signed)
Had a lengthy discussion today regarding treatment options.  Discussed Chantix and Wellbutrin.  Patient will consider. He is amenable to CT lung cancer screening.  Will arrange.

## 2022-06-15 NOTE — Progress Notes (Signed)
Subjective:  Patient ID: Michael Brady, male    DOB: 1955-07-11  Age: 66 y.o. MRN: [id number]  CC: Chief Complaint  Patient presents with   Follow-up    Pt states doing well. No issues or concerns at this time.     HPI:  66 year old male with peptic ulcer disease, history of renal pelvis cancer, renal insufficiency, history of H. pylori, history of small bowel obstruction, hyperlipidemia presents for follow-up.  Patient states that he is feeling well.  No abdominal pain.  Good appetite.  Has gained some weight.  Patient initially stopped smoking after he was hospitalized.  However, he is now smoking again.  Smokes approximately half a pack a day.  Will discuss options to help him quit smoking today.  He is interested in smoking cessation.  Patient denies any GI symptoms.  Continues to take Protonix.  Patient meets criteria for lung cancer screening.  Will discuss this today as well.  Patient Active Problem List   Diagnosis Date Noted   History of small bowel obstruction 10/19/2021   PUD (peptic ulcer disease) 10/19/2021   Renal insufficiency 09/14/2021   H. pylori infection 09/07/2021   History of renal pelvis cancer 08/26/2021   Severe Diverticulosis 08/26/2021   Smoker 05/31/2020   Depression, major, single episode, moderate ([place]) 11/21/2017   Hyperlipidemia 04/03/2017    Social Hx   Social History   Socioeconomic History   Marital status: Married    Spouse name: Not on file   Number of children: Not on file   Years of education: Not on file   Highest education level: Not on file  Occupational History   Not on file  Tobacco Use   Smoking status: Every Day    Packs/day: 1.50    Years: 40.00    Total pack years: 60.00    Types: Cigarettes   Smokeless tobacco: Never  Vaping Use   Vaping Use: Never used  Substance and Sexual Activity   Alcohol use: No   Drug use: No   Sexual activity: Not on file  Other Topics Concern   Not on file  Social History Narrative    Not on file   Social Determinants of Health   Financial Resource Strain: Not on file  Food Insecurity: Not on file  Transportation Needs: Not on file  Physical Activity: Not on file  Stress: Not on file  Social Connections: Not on file    Review of Systems Per HPI  Objective:  BP 127/78   Pulse 66   Wt 157 lb 6.4 oz (71.4 kg)   SpO2 97%   BMI 23.24 kg/m      06/15/2022   10:08 AM 03/15/2022    8:48 AM 02/01/2022    8:22 AM  BP/Weight  Systolic BP 010 932 355  Diastolic BP 78 93 89  Wt. (Lbs) 157.4 153.4 150.4  BMI 23.24 kg/m2 22.65 kg/m2 22.21 kg/m2    Physical Exam Constitutional:      General: He is not in acute distress.    Appearance: Normal appearance.  HENT:     Head: Normocephalic and atraumatic.  Cardiovascular:     Rate and Rhythm: Normal rate and regular rhythm.     Heart sounds: No murmur heard. Pulmonary:     Effort: Pulmonary effort is normal.     Breath sounds: Normal breath sounds. No wheezing, rhonchi or rales.  Abdominal:     General: There is no distension.     Palpations: Abdomen  is soft.     Tenderness: There is no abdominal tenderness.  Neurological:     Mental Status: He is alert.  Psychiatric:        Mood and Affect: Mood normal.        Behavior: Behavior normal.     Lab Results  Component Value Date   WBC 7.2 02/01/2022   HGB 13.8 02/01/2022   HCT 42.2 02/01/2022   PLT 237 02/01/2022   GLUCOSE 108 (H) 02/01/2022   CHOL 186 12/15/2021   TRIG 164 (H) 12/15/2021   HDL 46 12/15/2021   LDLCALC 111 (H) 12/15/2021   ALT 15 02/01/2022   AST 20 02/01/2022   NA 136 02/01/2022   K 4.9 02/01/2022   CL 101 02/01/2022   CREATININE 1.25 (H) 02/01/2022   BUN 16 02/01/2022   CO2 28 02/01/2022     Assessment & Plan:   Problem List Items Addressed This Visit       Digestive   PUD (peptic ulcer disease)    Stable.  Continue Protonix.        Other   Smoker    Had a lengthy discussion today regarding treatment options.   Discussed Chantix and Wellbutrin.  Patient will consider. He is amenable to CT lung cancer screening.  Will arrange.      Relevant Orders   CT CHEST LUNG CA SCREEN LOW DOSE W/O CM   Follow-up: 6 months  Willa Brocks Lacinda Axon DO [place]

## 2022-06-28 ENCOUNTER — Other Ambulatory Visit: Payer: Self-pay

## 2022-06-28 MED ORDER — NICOTINE 21 MG/24HR TD PT24: 21.0000 mg | MEDICATED_PATCH | Freq: Every day | TRANSDERMAL | 0 refills | Status: DC | PRN

## 2022-07-03 ENCOUNTER — Other Ambulatory Visit: Payer: Self-pay

## 2022-07-03 MED ORDER — NICOTINE 21 MG/24HR TD PT24: 21.0000 mg | MEDICATED_PATCH | Freq: Every day | TRANSDERMAL | 0 refills | Status: DC | PRN

## 2022-08-16 ENCOUNTER — Other Ambulatory Visit: Payer: Self-pay

## 2022-08-16 MED ORDER — NICOTINE 21 MG/24HR TD PT24: 21.0000 mg | MEDICATED_PATCH | Freq: Every day | TRANSDERMAL | 0 refills | Status: DC | PRN

## 2022-08-24 ENCOUNTER — Encounter: Payer: Self-pay | Admitting: *Deleted

## 2022-08-31 ENCOUNTER — Other Ambulatory Visit: Payer: Self-pay | Admitting: Family Medicine

## 2022-08-31 DIAGNOSIS — F321 Major depressive disorder, single episode, moderate: Secondary | ICD-10-CM

## 2022-09-14 ENCOUNTER — Other Ambulatory Visit: Payer: Self-pay

## 2022-09-14 NOTE — Telephone Encounter (Signed)
Prescription Request  09/14/2022  LOV: Visit date not found  What is the name of the medication or equipment? nicotine (NICODERM CQ - DOSED IN MG/24 HOURS) 21 mg/24hr patch   Have you contacted your pharmacy to request a refill? Yes   Which pharmacy would you like this sent to?  [id number] - [place], [address] [place] [address] Phone: [phone] Fax: [phone]    Patient notified that their request is being sent to the clinical staff for review and that they should receive a response within 2 business days.   Please advise at Mobile [phone] (mobile)

## 2022-09-16 MED ORDER — NICOTINE 21 MG/24HR TD PT24: 21.0000 mg | MEDICATED_PATCH | Freq: Every day | TRANSDERMAL | 6 refills | Status: DC

## 2022-11-27 ENCOUNTER — Other Ambulatory Visit: Payer: Self-pay | Admitting: Family Medicine

## 2022-11-27 DIAGNOSIS — F321 Major depressive disorder, single episode, moderate: Secondary | ICD-10-CM

## 2022-12-17 ENCOUNTER — Ambulatory Visit: Payer: BC Managed Care – PPO | Admitting: Family Medicine

## 2022-12-25 ENCOUNTER — Ambulatory Visit: Payer: BC Managed Care – PPO | Admitting: Family Medicine

## 2022-12-25 ENCOUNTER — Encounter: Payer: Self-pay | Admitting: Family Medicine

## 2022-12-25 VITALS — BP 138/82 | HR 64 | Temp 97.7°F | Ht 69.0 in | Wt 167.0 lb

## 2022-12-25 DIAGNOSIS — E785 Hyperlipidemia, unspecified: Secondary | ICD-10-CM | POA: Diagnosis not present

## 2022-12-25 DIAGNOSIS — J449 Chronic obstructive pulmonary disease, unspecified: Secondary | ICD-10-CM

## 2022-12-25 DIAGNOSIS — F17200 Nicotine dependence, unspecified, uncomplicated: Secondary | ICD-10-CM

## 2022-12-25 DIAGNOSIS — F321 Major depressive disorder, single episode, moderate: Secondary | ICD-10-CM

## 2022-12-25 DIAGNOSIS — Z125 Encounter for screening for malignant neoplasm of prostate: Secondary | ICD-10-CM | POA: Diagnosis not present

## 2022-12-25 DIAGNOSIS — N289 Disorder of kidney and ureter, unspecified: Secondary | ICD-10-CM | POA: Diagnosis not present

## 2022-12-25 MED ORDER — TRELEGY ELLIPTA 100-62.5-25 MCG/ACT IN AEPB: 1.0000 | INHALATION_SPRAY | Freq: Every day | RESPIRATORY_TRACT | 6 refills | Status: DC

## 2022-12-25 NOTE — Progress Notes (Signed)
Subjective:  Patient ID: Michael Brady, male    DOB: 09/14/1955  Age: 67 y.o. MRN: [id number]  CC: Chief Complaint  Patient presents with   6 month follow up     Discuss hx of smoking and breathing- has tried his friend's Trelligy ellipta - has really helped his energy levels    HPI:  67 year old male with history of renal pelvis cancer status post nephrectomy, severe diverticulosis, peptic ulcer disease which resulted in perforation and pneumoperitoneum, history of small bowel obstruction, hyperlipidemia, depression, tobacco abuse presents for follow-up.  Patient quit smoking for a while after hospitalization but is now back to smoking.  Smokes 5 to 6 cigarettes today.  He is interested in CT lung cancer screening.  This has been ordered.  He is waiting to get Medicare insurance to proceed.  Patient reports that he has some baseline shortness of breath which is likely due to COPD.  A friend of his gave him a 30-day supply of Trelegy and he found a significant improvement.  He would like to discuss starting this medication.  Patient is in need of labs.    Patient Active Problem List   Diagnosis Date Noted   COPD (chronic obstructive pulmonary disease) (HCC) 12/25/2022   History of small bowel obstruction 10/19/2021   PUD (peptic ulcer disease) 10/19/2021   Renal insufficiency 09/14/2021   History of renal pelvis cancer 08/26/2021   Severe Diverticulosis 08/26/2021   Smoker 05/31/2020   Depression, major, single episode, moderate (HCC) 11/21/2017   Hyperlipidemia 04/03/2017    Social Hx   Social History   Socioeconomic History   Marital status: Married    Spouse name: Not on file   Number of children: Not on file   Years of education: Not on file   Highest education level: 12th grade  Occupational History   Not on file  Tobacco Use   Smoking status: Every Day    Packs/day: 1.50    Years: 40.00    Additional pack years: 0.00    Total pack years: 60.00    Types:  Cigarettes   Smokeless tobacco: Never  Vaping Use   Vaping Use: Never used  Substance and Sexual Activity   Alcohol use: No   Drug use: No   Sexual activity: Not on file  Other Topics Concern   Not on file  Social History Narrative   Not on file   Social Determinants of Health   Financial Resource Strain: Low Risk  (12/24/2022)   Overall Financial Resource Strain (CARDIA)    Difficulty of Paying Living Expenses: Not hard at all  Food Insecurity: No Food Insecurity (12/24/2022)   Hunger Vital Sign    Worried About Running Out of Food in the Last Year: Never true    Ran Out of Food in the Last Year: Never true  Transportation Needs: No Transportation Needs (12/24/2022)   PRAPARE - Administrator, Civil Service (Medical): No    Lack of Transportation (Non-Medical): No  Physical Activity: Insufficiently Active (12/24/2022)   Exercise Vital Sign    Days of Exercise per Week: 5 days    Minutes of Exercise per Session: 20 min  Stress: No Stress Concern Present (12/24/2022)   [employer] of Occupational Health - Occupational Stress Questionnaire    Feeling of Stress : Not at all  Social Connections: Socially Integrated (12/24/2022)   Social Connection and Isolation Panel [NHANES]    Frequency of Communication with Friends and  Family: More than three times a week    Frequency of Social Gatherings with Friends and Family: Three times a week    Attends Religious Services: More than 4 times per year    Active Member of Clubs or Organizations: Yes    Attends Engineer, structural: More than 4 times per year    Marital Status: Married    Review of Systems Per HPI  Objective:  BP 138/82   Pulse 64   Temp 97.7 F (36.5 C)   Ht 5\' 9"  (1.753 m)   Wt 167 lb (75.8 kg)   SpO2 98%   BMI 24.66 kg/m      12/25/2022   10:35 AM 06/15/2022   10:08 AM 03/15/2022    8:48 AM  BP/Weight  Systolic BP 138 127 160  Diastolic BP 82 78 93  Wt. (Lbs) 167 157.4 153.4  BMI  24.66 kg/m2 23.24 kg/m2 22.65 kg/m2    Physical Exam Vitals and nursing note reviewed.  Constitutional:      General: He is not in acute distress.    Appearance: Normal appearance.  Cardiovascular:     Rate and Rhythm: Normal rate and regular rhythm.  Pulmonary:     Effort: Pulmonary effort is normal.     Breath sounds: Normal breath sounds. No wheezing, rhonchi or rales.  Abdominal:     General: There is no distension.     Palpations: Abdomen is soft.     Tenderness: There is no abdominal tenderness.  Neurological:     Mental Status: He is alert.  Psychiatric:        Mood and Affect: Mood normal.        Behavior: Behavior normal.     Lab Results  Component Value Date   WBC 7.2 02/01/2022   HGB 13.8 02/01/2022   HCT 42.2 02/01/2022   PLT 237 02/01/2022   GLUCOSE 108 (H) 02/01/2022   CHOL 186 12/15/2021   TRIG 164 (H) 12/15/2021   HDL 46 12/15/2021   LDLCALC 111 (H) 12/15/2021   ALT 15 02/01/2022   AST 20 02/01/2022   NA 136 02/01/2022   K 4.9 02/01/2022   CL 101 02/01/2022   CREATININE 1.25 (H) 02/01/2022   BUN 16 02/01/2022   CO2 28 02/01/2022     Assessment & Plan:   Problem List Items Addressed This Visit       Respiratory   COPD (chronic obstructive pulmonary disease) (HCC) - Primary    Patient likely has COPD.  Starting on Trelegy.      Relevant Medications   Fluticasone-Umeclidin-Vilant (TRELEGY ELLIPTA) 100-62.5-25 MCG/ACT AEPB     Genitourinary   Renal insufficiency   Relevant Orders   CBC   CMP14+EGFR   Microalbumin / creatinine urine ratio     Other   Hyperlipidemia   Relevant Orders   Lipid panel   Smoker    Recommended smoking cessation.  Patient states that he is going to do this when he retires.      Depression, major, single episode, moderate (HCC)    Stable on Celexa.  Continue.      Other Visit Diagnoses     Prostate cancer screening       Relevant Orders   PSA       Meds ordered this encounter  Medications    Fluticasone-Umeclidin-Vilant (TRELEGY ELLIPTA) 100-62.5-25 MCG/ACT AEPB    Sig: Inhale 1 puff into the lungs daily.    Dispense:  60 each  Refill:  6    Follow-up:  Return in about 6 months (around 06/26/2023).  Everlene Other DO [facility]

## 2022-12-25 NOTE — Assessment & Plan Note (Signed)
Patient likely has COPD.  Starting on Trelegy.

## 2022-12-25 NOTE — Patient Instructions (Signed)
Labs today.  Work on quitting smoking.  Rx for Trelegy sent.  Follow up in 6 months.

## 2022-12-25 NOTE — Assessment & Plan Note (Signed)
Stable on Celexa.  Continue. 

## 2022-12-25 NOTE — Assessment & Plan Note (Signed)
Recommended smoking cessation.  Patient states that he is going to do this when he retires.

## 2022-12-26 DIAGNOSIS — Z125 Encounter for screening for malignant neoplasm of prostate: Secondary | ICD-10-CM | POA: Diagnosis not present

## 2022-12-26 DIAGNOSIS — N289 Disorder of kidney and ureter, unspecified: Secondary | ICD-10-CM | POA: Diagnosis not present

## 2022-12-26 DIAGNOSIS — E785 Hyperlipidemia, unspecified: Secondary | ICD-10-CM | POA: Diagnosis not present

## 2022-12-27 LAB — CMP14+EGFR
ALT: 19 IU/L (ref 0–44)
AST: 25 IU/L (ref 0–40)
Albumin: 4.8 g/dL (ref 3.9–4.9)
Alkaline Phosphatase: 83 IU/L (ref 44–121)
BUN/Creatinine Ratio: 15 (ref 10–24)
BUN: 21 mg/dL (ref 8–27)
Bilirubin Total: 0.3 mg/dL (ref 0.0–1.2)
CO2: 26 mmol/L (ref 20–29)
Calcium: 10.1 mg/dL (ref 8.6–10.2)
Chloride: 98 mmol/L (ref 96–106)
Creatinine, Ser: 1.36 mg/dL — ABNORMAL HIGH (ref 0.76–1.27)
Globulin, Total: 2.7 g/dL (ref 1.5–4.5)
Glucose: 85 mg/dL (ref 70–99)
Potassium: 5.6 mmol/L — ABNORMAL HIGH (ref 3.5–5.2)
Sodium: 137 mmol/L (ref 134–144)
Total Protein: 7.5 g/dL (ref 6.0–8.5)
eGFR: 58 mL/min/{1.73_m2} — ABNORMAL LOW (ref >59)

## 2022-12-27 LAB — LIPID PANEL
Chol/HDL Ratio: 5.2 ratio — ABNORMAL HIGH (ref 0.0–5.0)
Cholesterol, Total: 244 mg/dL — ABNORMAL HIGH (ref 100–199)
HDL: 47 mg/dL (ref >39)
LDL Chol Calc (NIH): 165 mg/dL — ABNORMAL HIGH (ref 0–99)
Triglycerides: 172 mg/dL — ABNORMAL HIGH (ref 0–149)
VLDL Cholesterol Cal: 32 mg/dL (ref 5–40)

## 2022-12-27 LAB — MICROALBUMIN / CREATININE URINE RATIO
Creatinine, Urine: 145.6 mg/dL
Microalb/Creat Ratio: 32 mg/g creat — ABNORMAL HIGH (ref 0–29)
Microalbumin, Urine: 46 ug/mL

## 2022-12-27 LAB — CBC
Hematocrit: 48.1 % (ref 37.5–51.0)
Hemoglobin: 15.5 g/dL (ref 13.0–17.7)
MCH: 30.2 pg (ref 26.6–33.0)
MCHC: 32.2 g/dL (ref 31.5–35.7)
MCV: 94 fL (ref 79–97)
Platelets: 318 10*3/uL (ref 150–450)
RBC: 5.14 x10E6/uL (ref 4.14–5.80)
RDW: 12.3 % (ref 11.6–15.4)
WBC: 8.3 10*3/uL (ref 3.4–10.8)

## 2022-12-27 LAB — PSA: Prostate Specific Ag, Serum: 1.6 ng/mL (ref 0.0–4.0)

## 2023-01-03 ENCOUNTER — Other Ambulatory Visit: Payer: Self-pay | Admitting: Family Medicine

## 2023-01-03 MED ORDER — LOSARTAN POTASSIUM 25 MG PO TABS: 25.0000 mg | ORAL_TABLET | Freq: Every day | ORAL | 1 refills | Status: DC

## 2023-01-03 MED ORDER — ROSUVASTATIN CALCIUM 20 MG PO TABS: 20.0000 mg | ORAL_TABLET | Freq: Every day | ORAL | 3 refills | Status: DC

## 2023-02-25 ENCOUNTER — Other Ambulatory Visit: Payer: Self-pay | Admitting: Family Medicine

## 2023-02-25 DIAGNOSIS — F321 Major depressive disorder, single episode, moderate: Secondary | ICD-10-CM

## 2023-04-19 ENCOUNTER — Other Ambulatory Visit: Payer: Self-pay | Admitting: Family Medicine

## 2023-06-26 ENCOUNTER — Ambulatory Visit (INDEPENDENT_AMBULATORY_CARE_PROVIDER_SITE_OTHER): Payer: PPO | Admitting: Family Medicine

## 2023-06-26 DIAGNOSIS — E785 Hyperlipidemia, unspecified: Secondary | ICD-10-CM

## 2023-06-26 DIAGNOSIS — N289 Disorder of kidney and ureter, unspecified: Secondary | ICD-10-CM | POA: Diagnosis not present

## 2023-06-26 DIAGNOSIS — F17200 Nicotine dependence, unspecified, uncomplicated: Secondary | ICD-10-CM | POA: Diagnosis not present

## 2023-06-26 DIAGNOSIS — J449 Chronic obstructive pulmonary disease, unspecified: Secondary | ICD-10-CM | POA: Diagnosis not present

## 2023-06-26 DIAGNOSIS — I10 Essential (primary) hypertension: Secondary | ICD-10-CM

## 2023-06-26 MED ORDER — LOSARTAN POTASSIUM 25 MG PO TABS: 25.0000 mg | ORAL_TABLET | Freq: Every day | ORAL | 1 refills | Status: DC

## 2023-06-26 MED ORDER — TRELEGY ELLIPTA 100-62.5-25 MCG/ACT IN AEPB: 1.0000 | INHALATION_SPRAY | Freq: Every day | RESPIRATORY_TRACT | 6 refills | Status: DC

## 2023-06-26 NOTE — Assessment & Plan Note (Signed)
Referral placed for CT lung cancer screening. 

## 2023-06-26 NOTE — Assessment & Plan Note (Signed)
Stable on Trelegy.  Needs to quit smoking.

## 2023-06-26 NOTE — Progress Notes (Signed)
Subjective:  Patient ID: Michael Brady, male    DOB: 04-May-1956  Age: 67 y.o. MRN: [id number]  CC:   Chief Complaint  Patient presents with   Hyperlipidemia   COPD    HPI:  67 year old male with a history of peptic ulcer disease, severe diverticulosis, history of renal cancer with subsequent renal insufficiency, history small bowel obstruction, hyperlipidemia, depression, tobacco abuse presents for follow-up.  Patient states that he is overall doing well.  He does note "change in my taste buds) after starting Crestor.  He also reports recent weight gain which she is concerned is related to Crestor.  Lipids have been uncontrolled.  Needs reevaluation of lipids today.  COPD stable on Trelegy.  He continues to smoke 6 to 10 cigarettes a day.  He is interested in CT lung cancer screening.  Blood pressure stable on losartan.  In regards to his preventative health care, he declines pneumococcal vaccine.  Patient Active Problem List   Diagnosis Date Noted   Essential hypertension 06/26/2023   COPD (chronic obstructive pulmonary disease) (HCC) 12/25/2022   History of small bowel obstruction 10/19/2021   PUD (peptic ulcer disease) 10/19/2021   Renal insufficiency 09/14/2021   History of renal pelvis cancer 08/26/2021   Severe Diverticulosis 08/26/2021   Smoker 05/31/2020   Depression, major, single episode, moderate (HCC) 11/21/2017   Hyperlipidemia 04/03/2017    Social Hx   Social History   Socioeconomic History   Marital status: Married    Spouse name: Not on file   Number of children: Not on file   Years of education: Not on file   Highest education level: 12th grade  Occupational History   Not on file  Tobacco Use   Smoking status: Every Day    Current packs/day: 1.50    Average packs/day: 1.5 packs/day for 40.0 years (60.0 ttl pk-yrs)    Types: Cigarettes   Smokeless tobacco: Never  Vaping Use   Vaping status: Never Used  Substance and Sexual Activity   Alcohol  use: No   Drug use: No   Sexual activity: Not on file  Other Topics Concern   Not on file  Social History Narrative   Not on file   Social Drivers of Health   Financial Resource Strain: Low Risk  (12/24/2022)   Overall Financial Resource Strain (CARDIA)    Difficulty of Paying Living Expenses: Not hard at all  Food Insecurity: No Food Insecurity (12/24/2022)   Hunger Vital Sign    Worried About Running Out of Food in the Last Year: Never true    Ran Out of Food in the Last Year: Never true  Transportation Needs: No Transportation Needs (12/24/2022)   PRAPARE - Administrator, Civil Service (Medical): No    Lack of Transportation (Non-Medical): No  Physical Activity: Insufficiently Active (12/24/2022)   Exercise Vital Sign    Days of Exercise per Week: 5 days    Minutes of Exercise per Session: 20 min  Stress: No Stress Concern Present (12/24/2022)   [employer] of Occupational Health - Occupational Stress Questionnaire    Feeling of Stress : Not at all  Social Connections: Socially Integrated (12/24/2022)   Social Connection and Isolation Panel [NHANES]    Frequency of Communication with Friends and Family: More than three times a week    Frequency of Social Gatherings with Friends and Family: Three times a week    Attends Religious Services: More than 4 times per year  Active Member of Clubs or Organizations: Yes    Attends Banker Meetings: More than 4 times per year    Marital Status: Married    Review of Systems  Respiratory: Negative.    Cardiovascular: Negative.      Objective:  BP 131/79   Pulse (!) 59   Temp (!) 97 F (36.1 C)   Ht 5\' 9"  (1.753 m)   Wt 178 lb (80.7 kg)   SpO2 96%   BMI 26.29 kg/m      06/26/2023    8:38 AM 06/26/2023    8:37 AM 12/25/2022   10:35 AM  BP/Weight  Systolic BP 131 150 138  Diastolic BP 79 90 82  Wt. (Lbs)  178 167  BMI  26.29 kg/m2 24.66 kg/m2    Physical Exam Vitals and nursing note  reviewed.  Constitutional:      General: He is not in acute distress.    Appearance: Normal appearance.  HENT:     Head: Normocephalic and atraumatic.  Cardiovascular:     Rate and Rhythm: Normal rate and regular rhythm.  Pulmonary:     Effort: Pulmonary effort is normal.     Breath sounds: Normal breath sounds. No wheezing, rhonchi or rales.  Abdominal:     General: There is no distension.     Palpations: Abdomen is soft.     Tenderness: There is no abdominal tenderness.  Neurological:     Mental Status: He is alert.  Psychiatric:        Mood and Affect: Mood normal.        Behavior: Behavior normal.     Lab Results  Component Value Date   WBC 8.3 12/26/2022   HGB 15.5 12/26/2022   HCT 48.1 12/26/2022   PLT 318 12/26/2022   GLUCOSE 85 12/26/2022   CHOL 244 (H) 12/26/2022   TRIG 172 (H) 12/26/2022   HDL 47 12/26/2022   LDLCALC 165 (H) 12/26/2022   ALT 19 12/26/2022   AST 25 12/26/2022   NA 137 12/26/2022   K 5.6 (H) 12/26/2022   CL 98 12/26/2022   CREATININE 1.36 (H) 12/26/2022   BUN 21 12/26/2022   CO2 26 12/26/2022     Assessment & Plan:   Problem List Items Addressed This Visit       Cardiovascular and Mediastinum   Essential hypertension   Stable on losartan.  Continue.      Relevant Medications   losartan (COZAAR) 25 MG tablet     Respiratory   COPD (chronic obstructive pulmonary disease) (HCC)   Stable on Trelegy.  Needs to quit smoking.      Relevant Medications   Fluticasone-Umeclidin-Vilant (TRELEGY ELLIPTA) 100-62.5-25 MCG/ACT AEPB     Genitourinary   Renal insufficiency   Relevant Orders   Microalbumin / creatinine urine ratio   Basic metabolic panel     Other   Hyperlipidemia   Labs today to reassess.  Continue Crestor.      Relevant Medications   losartan (COZAAR) 25 MG tablet   Other Relevant Orders   Lipid panel   Smoker   Referral placed for CT lung cancer screening.      Relevant Orders   Ambulatory Referral Lung  Cancer Screening [facility]    Meds ordered this encounter  Medications   losartan (COZAAR) 25 MG tablet    Sig: Take 1 tablet (25 mg total) by mouth daily.    Dispense:  90 tablet    Refill:  1   Fluticasone-Umeclidin-Vilant (TRELEGY ELLIPTA) 100-62.5-25 MCG/ACT AEPB    Sig: Inhale 1 puff into the lungs daily.    Dispense:  60 each    Refill:  6    Follow-up:  6 months  Meriem Lemieux Adriana Simas DO [facility]

## 2023-06-26 NOTE — Patient Instructions (Addendum)
Labs today.  Referral placed for Lung cancer screening.   Follow up in 6 months.  Take care  Dr. Adriana Simas

## 2023-06-26 NOTE — Assessment & Plan Note (Signed)
Stable on losartan. Continue.

## 2023-06-26 NOTE — Assessment & Plan Note (Signed)
Labs today to reassess.  Continue Crestor.

## 2023-06-27 LAB — LIPID PANEL
Chol/HDL Ratio: 3.6 {ratio} (ref 0.0–5.0)
Cholesterol, Total: 168 mg/dL (ref 100–199)
HDL: 47 mg/dL (ref >39)
LDL Chol Calc (NIH): 90 mg/dL (ref 0–99)
Triglycerides: 180 mg/dL — ABNORMAL HIGH (ref 0–149)
VLDL Cholesterol Cal: 31 mg/dL (ref 5–40)

## 2023-06-27 LAB — BASIC METABOLIC PANEL
BUN/Creatinine Ratio: 14 (ref 10–24)
BUN: 19 mg/dL (ref 8–27)
CO2: 23 mmol/L (ref 20–29)
Calcium: 10 mg/dL (ref 8.6–10.2)
Chloride: 97 mmol/L (ref 96–106)
Creatinine, Ser: 1.33 mg/dL — ABNORMAL HIGH (ref 0.76–1.27)
Glucose: 90 mg/dL (ref 70–99)
Potassium: 5.2 mmol/L (ref 3.5–5.2)
Sodium: 138 mmol/L (ref 134–144)
eGFR: 59 mL/min/{1.73_m2} — ABNORMAL LOW (ref >59)

## 2023-06-27 LAB — MICROALBUMIN / CREATININE URINE RATIO
Creatinine, Urine: 50.5 mg/dL
Microalb/Creat Ratio: 50 mg/g{creat} — ABNORMAL HIGH (ref 0–29)
Microalbumin, Urine: 25.1 ug/mL

## 2023-07-14 ENCOUNTER — Other Ambulatory Visit: Payer: Self-pay | Admitting: Family Medicine

## 2023-07-14 MED ORDER — EMPAGLIFLOZIN 10 MG PO TABS: 10.0000 mg | ORAL_TABLET | Freq: Every day | ORAL | 3 refills | Status: DC

## 2023-07-20 ENCOUNTER — Other Ambulatory Visit: Payer: Self-pay | Admitting: Family Medicine

## 2023-08-01 IMAGING — DX DG CHEST 1V SAME DAY
2 series · 2 of 2 positions shown · non-contrast
Comparison: Previous studies including the examination done earlier
today

CLINICAL DATA: NG tube placement

EXAM:
CHEST - 1 VIEW SAME DAY

[chest ap (1 of 2)]
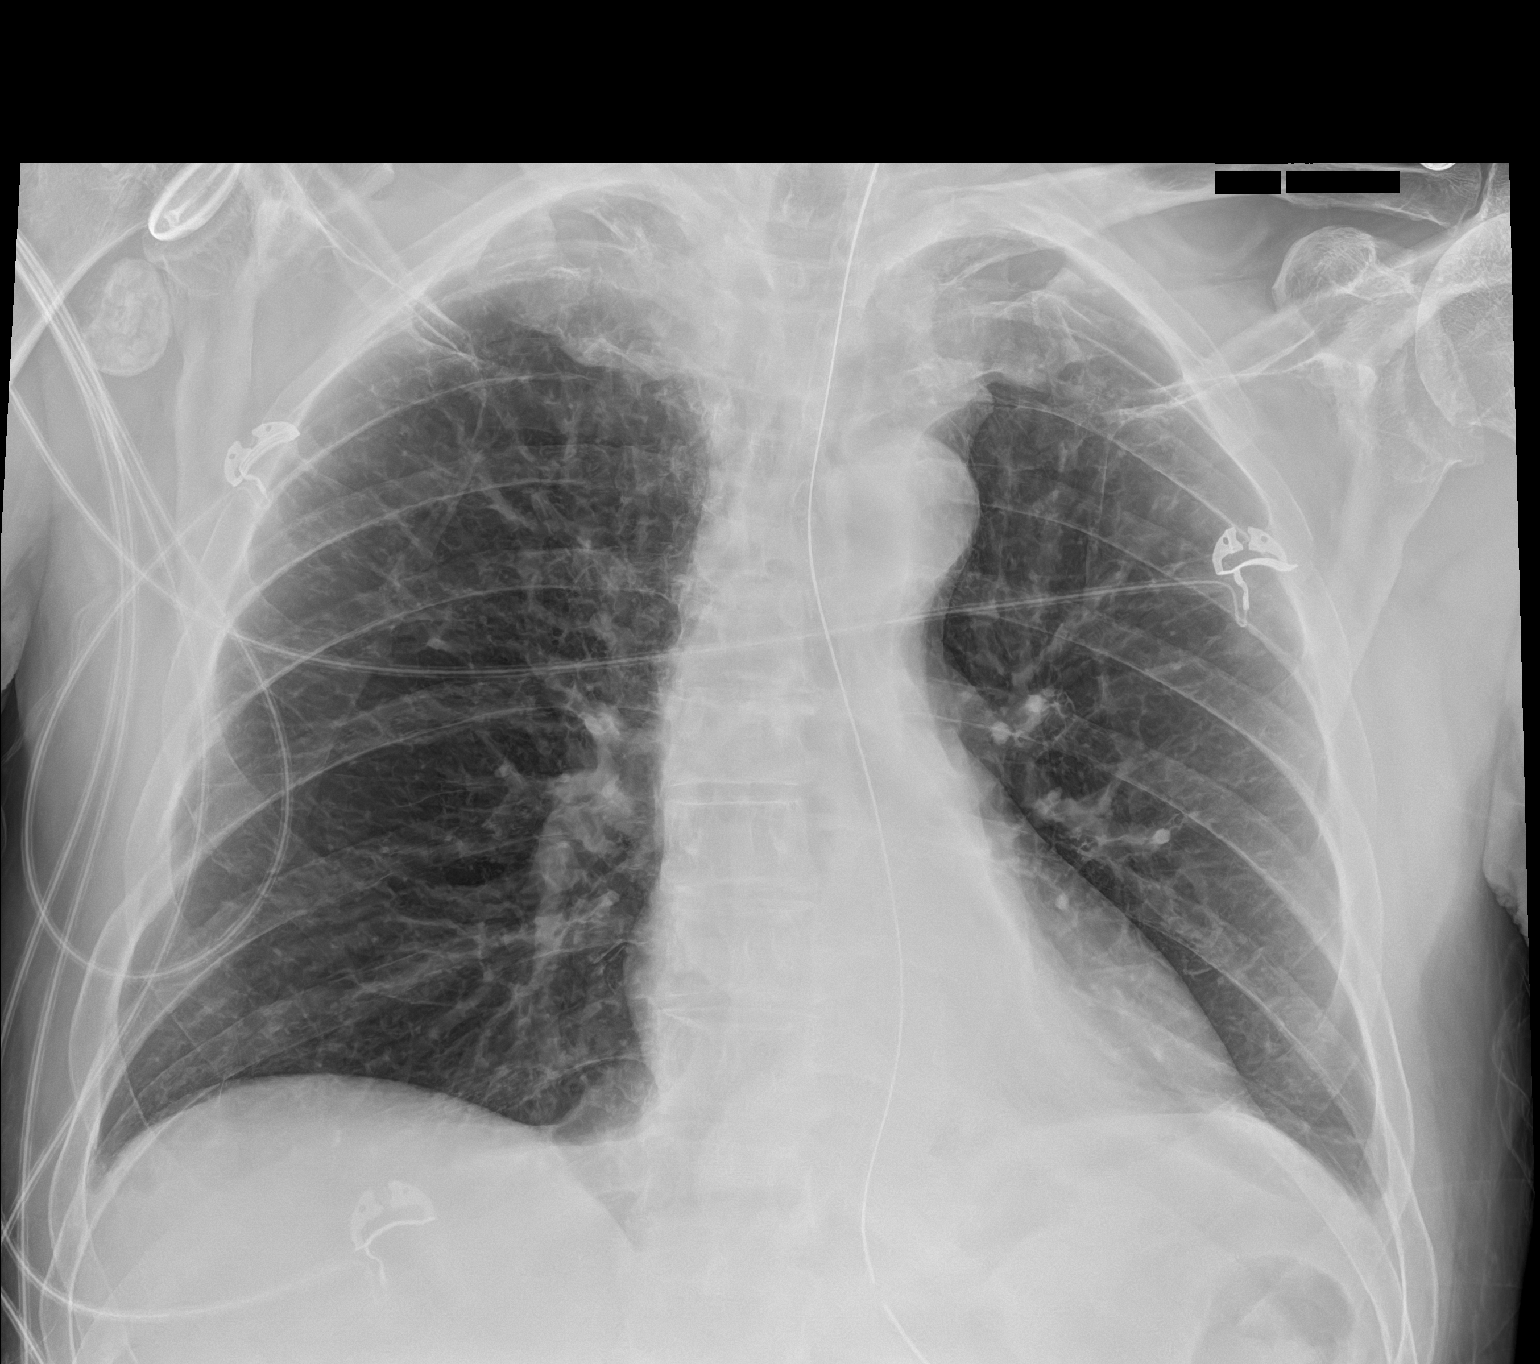

[chest ap (2 of 2)]
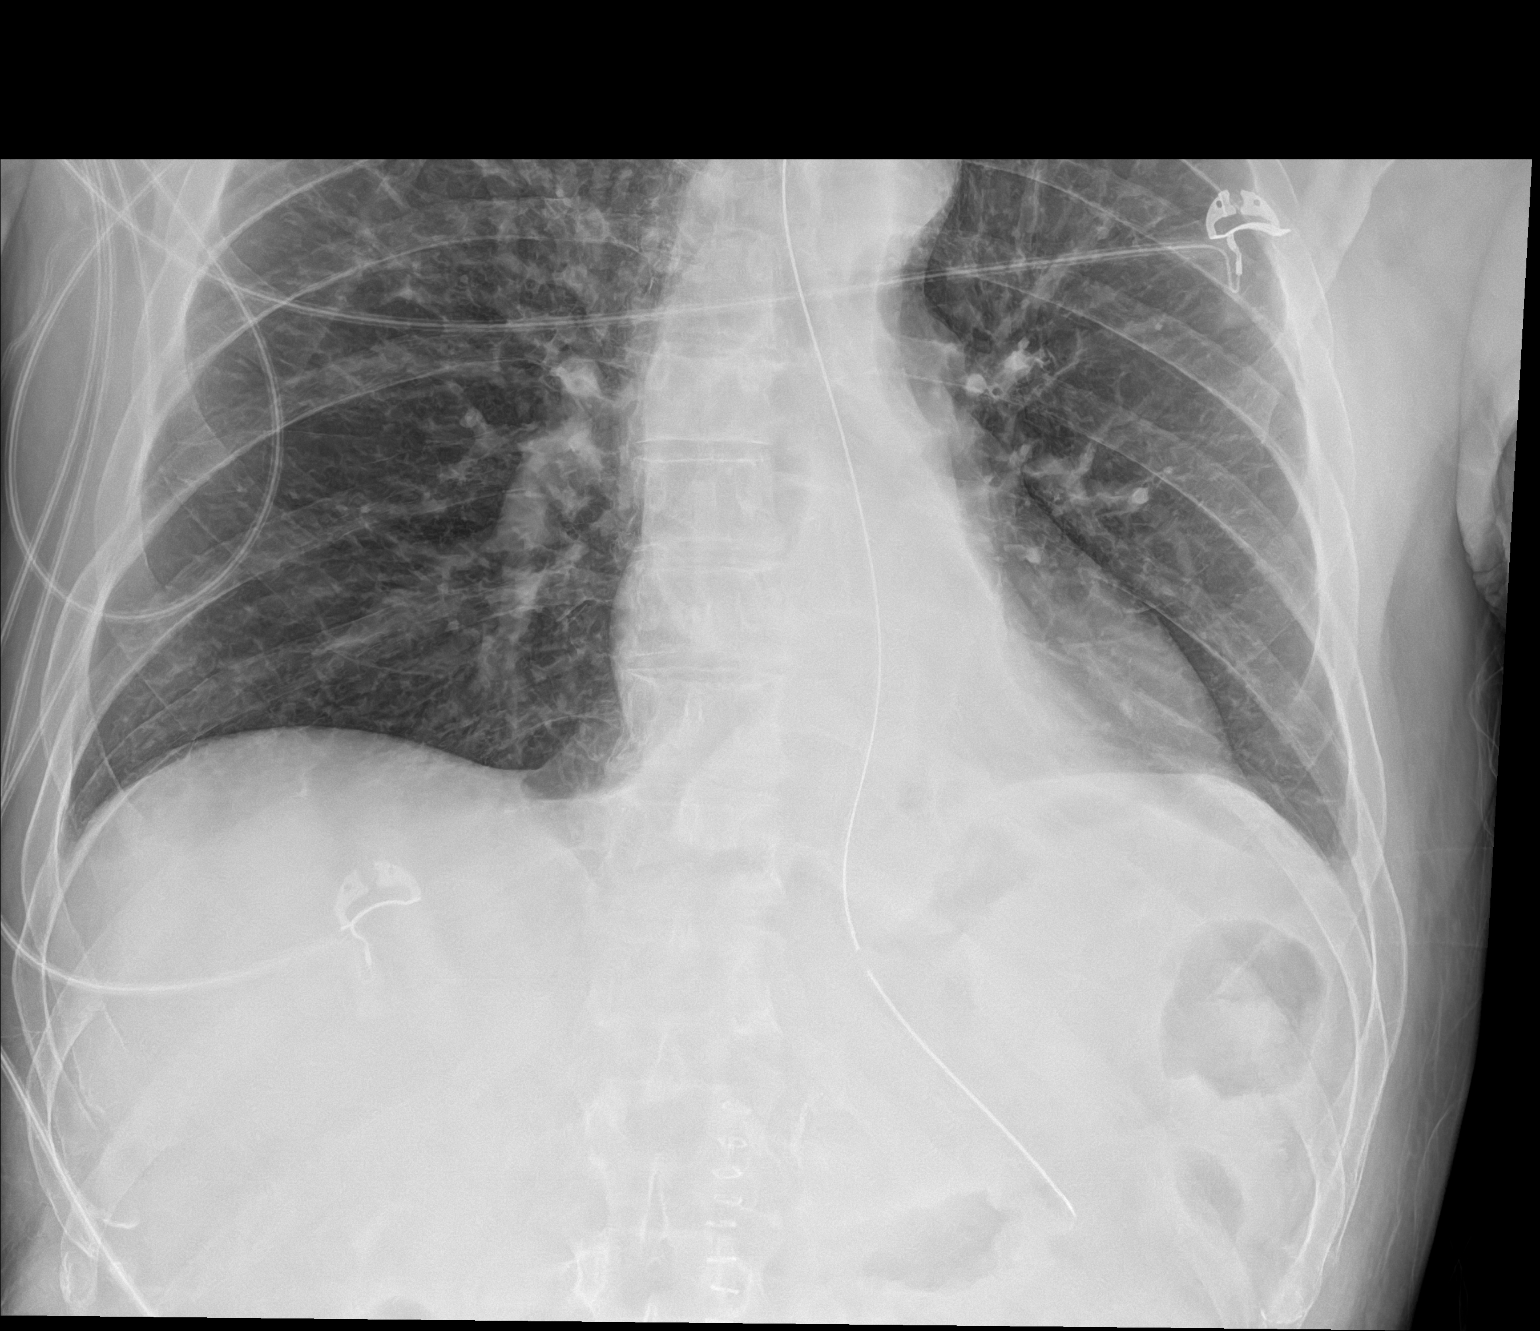

[2 of 2 positions shown; findings below may reference images not displayed]

FINDINGS: There is interval repositioning of enteric tube with its tip in the
stomach. Skin staples are seen in the abdomen. Cardiac size is
within normal limits. Increased density is seen in the medial left
lower lung fields. In the CT done earlier today there was no
definite focal infiltrate in the medial left lower lung fields.
There is soft tissue fullness in the retrocardiac region which may
be due to tortuous thoracic aorta or small fixed hiatal hernia.
Costophrenic angles are clear. There is no pneumothorax.
Degenerative changes are noted in right shoulder. There is large
smooth marginated calcification adjacent to the inferior margin of
glenoid on the right side possibly a loose body or old ununited
fracture.
IMPRESSION: There is interval repositioning of enteric tube with its tip in the
stomach.

There is increased opacity in the medial left lower lung fields
suggesting atelectasis. Possible small hiatal hernia.

## 2023-08-01 IMAGING — CT CT ABD-PELV W/ CM
2 of 5 series · 14 of 46 positions shown, 16 images · IV contrast (Omnipaque or Isovue)
Comparison: CT Abdomen and Pelvis [HOSPITAL]
06/12/2019 and earlier.

CLINICAL DATA: 65-year-old male with acute [DATE] epigastric
abdominal pain. Pancreatitis suspected.

EXAM:
CT ABDOMEN AND PELVIS WITH CONTRAST
TECHNIQUE: Multidetector CT imaging of the abdomen and pelvis was performed
using the standard protocol following bolus administration of
intravenous contrast.

[Series 2: axial st · axial · 0.69mm/px · z∈[+943,+1363]mm · 11 of 96 slices shown, 13 images]
[im 6/96  soft-tissue]
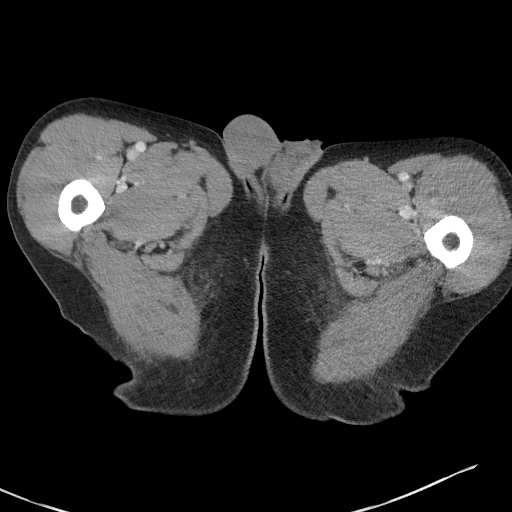
[im 6/96  bone]
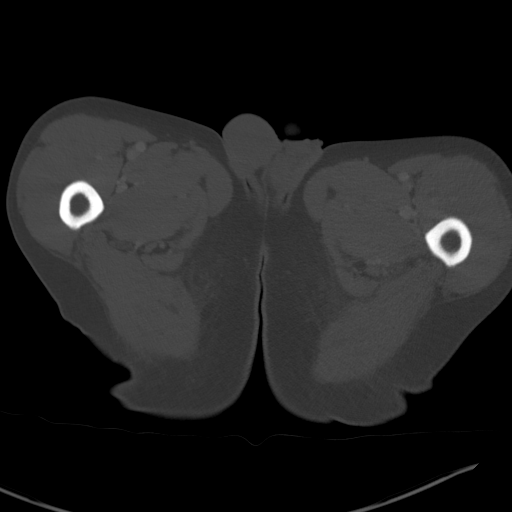
[im 16/96  soft-tissue]
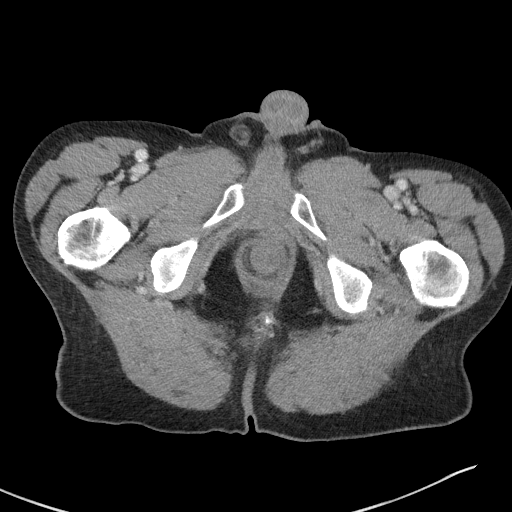
[im 22/96  soft-tissue]
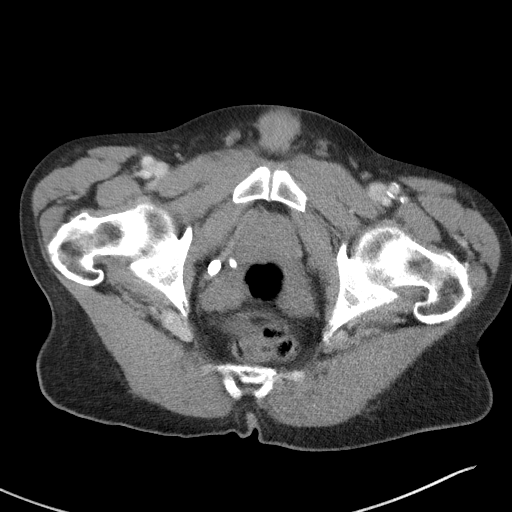
[im 32/96  soft-tissue]
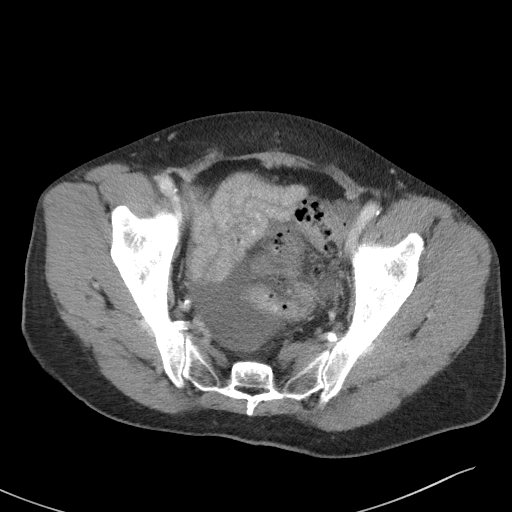
[im 37/96  soft-tissue]
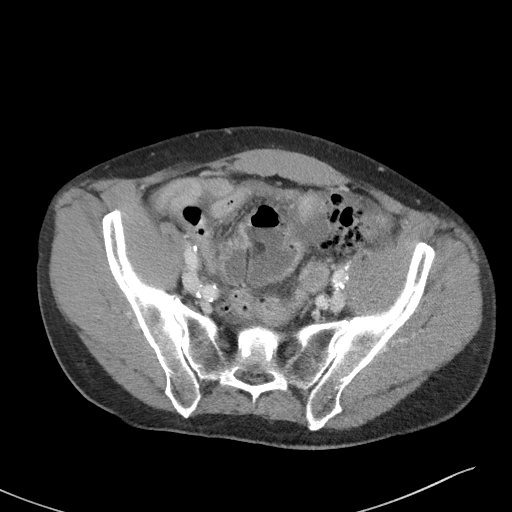
[im 48/96  soft-tissue]
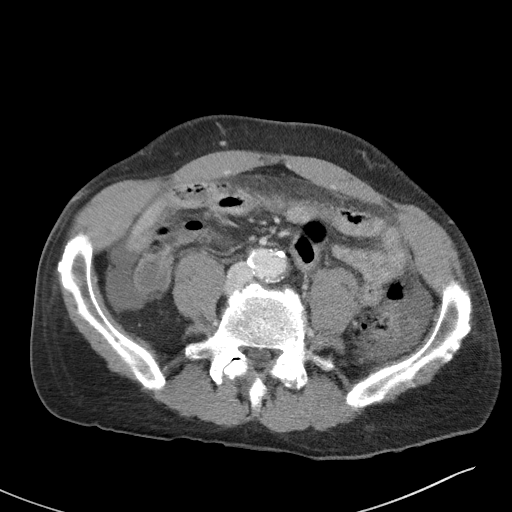
[im 59/96  soft-tissue]
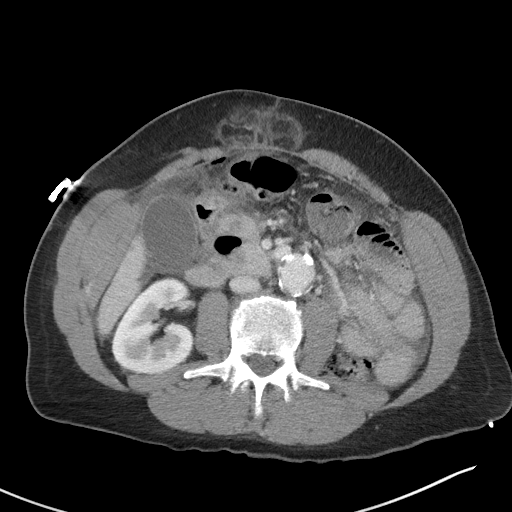
[im 64/96  soft-tissue]
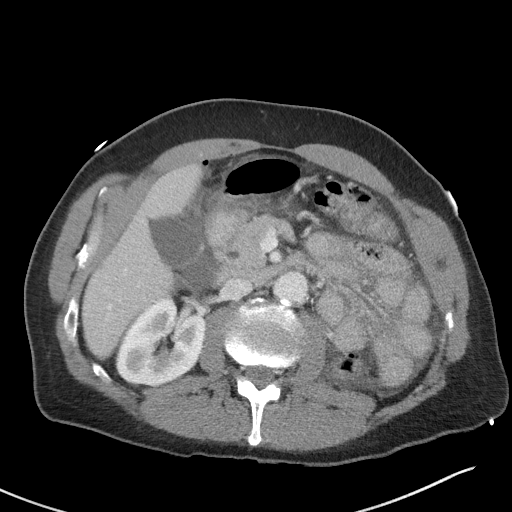
[im 74/96  soft-tissue]
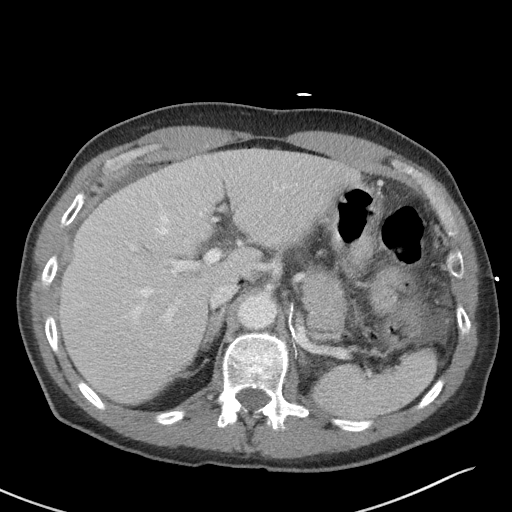
[im 74/96  bone]
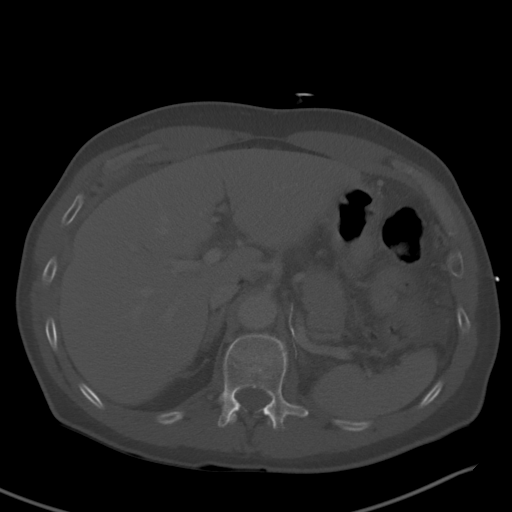
[im 80/96  soft-tissue]
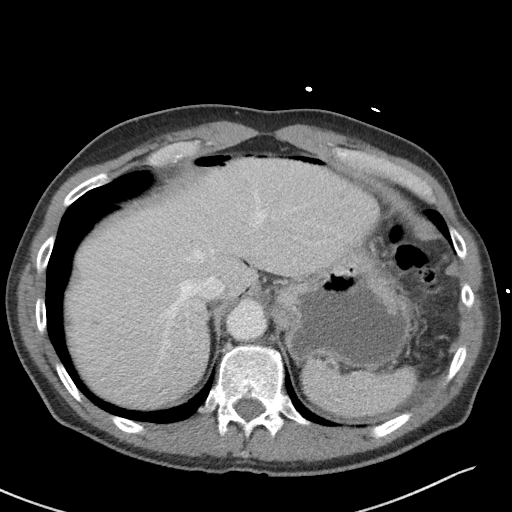
[im 90/96  soft-tissue]
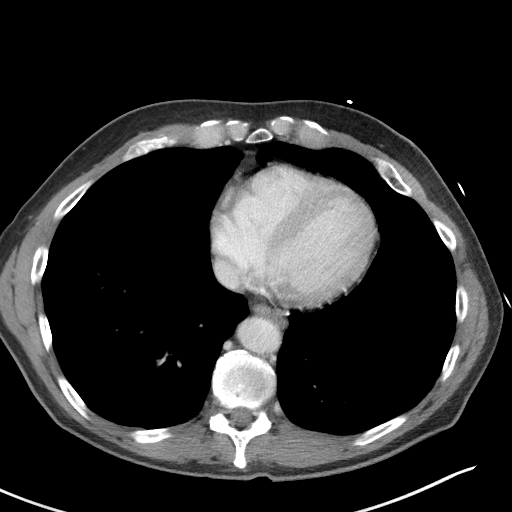

[Series 5: coronal st · coronal · 0.72mm/px · 3 of 94 slices shown]
[im 32/94  soft-tissue]
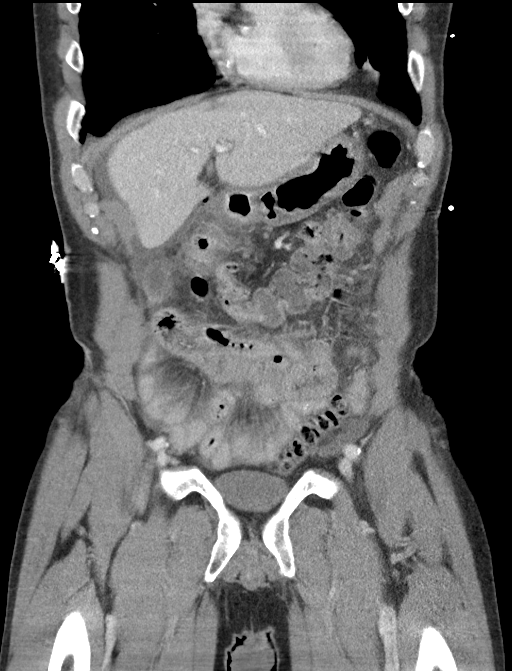
[im 42/94  soft-tissue]
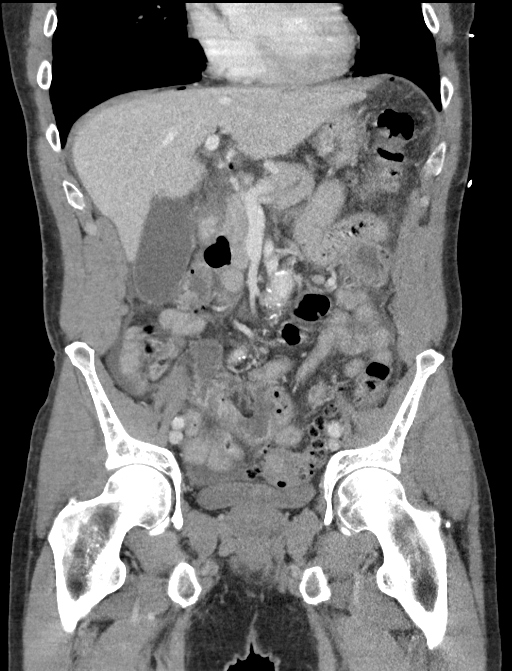
[im 52/94  soft-tissue]
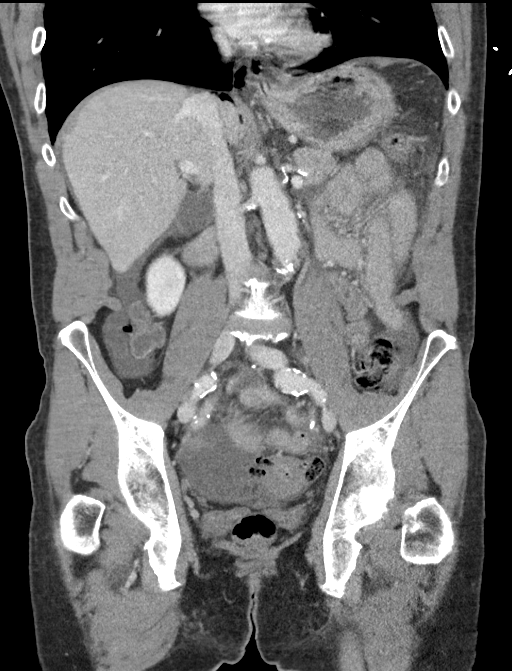

[14 of 46 positions shown; findings below may reference images not displayed]

RADIATION DOSE REDUCTION: This exam was performed according to the
departmental dose-optimization program which includes automated
exposure control, adjustment of the mA and/or kV according to
patient size and/or use of iterative reconstruction technique.

CONTRAST:  75mL OMNIPAQUE IOHEXOL 300 MG/ML  SOLN
FINDINGS: Lower chest: Negative. No pericardial or pleural effusion.

Hepatobiliary: Small volume pneumoperitoneum and free fluid around
the liver. Mildly complex fluid density. Liver enhancement
maintained. Gallbladder within normal limits. No bile duct
enlargement.

Pancreas: Within normal limits.

Spleen: Small volume perisplenic fluid. Otherwise negative.

Adrenals/Urinary Tract: Chronically absent left kidney. Stable
postoperative appearance of the left renal fossa since 0000. Normal
adrenal glands. Nonobstructed solitary right kidney. Normal right
renal contrast excretion. Diminutive bladder.

Stomach/Bowel: Severe diverticulosis of the descending and sigmoid
colon, bowel walls of those segments appear highly indistinct. Small
volume pneumoperitoneum under the diaphragm and situated adjacent to
the liver. No retroperitoneal gas is identified. Complex fluid
layering in both pericolic gutters. Small volume free fluid
elsewhere in the abdomen.

Superimposed chronic but enlarged and inflamed appearing umbilical
hernia containing transverse mesocolon (series 2, image 40). No free
air within the hernia, but underlying transverse colon diverticula
and irregular bowel wall appearance there also. Additionally, there
is a large gas containing 6.4 cm diverticulum versus contained
perforation immediately subjacent to the herniated mesentery
(coronal image 64) which is in proximity to both the transverse
colon and inflamed gastroduodenal junction. Axial images however
suggest this communicates with the transverse colon (series 2 images
35 through 39).

Right: Mostly decompressed and fluid containing. No superimposed
dilated small bowel. Small gastric hiatal hernia, and small volume
of pneumoperitoneum in that hernia sac.

Inflamed appearance of both the gastric antrum and the duodenal C
loop as seen on coronal image 38. Duodenum is decompressed. The
distal duodenum and ligament of Treitz have a more normal
appearance. No other inflamed small bowel loops identified. Cecum is
located in the midline, and midline appendix appears normal on
coronal image 49.

Vascular/Lymphatic: Widespread Aortoiliac calcified atherosclerosis.
Tortuous infrarenal abdominal aorta, but no infrarenal aneurysm
considering less than 1.5 times the normal caliber. Major arterial
structures remain patent. No lymphadenopathy identified. Portal
venous system patent.

Reproductive: Negative.

Other: Complex free fluid in the pelvis, moderate to large volume.

Musculoskeletal: Chronic grade 1 spondylolisthesis at L4-L5. No
acute osseous abnormality identified.
IMPRESSION: 1. Positive for Acutely Perforated Bowel.
In the epigastrium both a transverse mesocolon containing umbilical
hernia and the gastroduodenal junction appear acutely inflamed. Just
deep to the ventral abdominal hernia a 6.4 cm pocket of gas could be
a giant diverticulum, or perforation site, and is favored to arise
from the Transverse Colon. But the possibility of a perforated
peptic ulcer disease remains.

2. Associated pneumoperitoneum and mildly complex free fluid in the
abdomen and pelvis.

3. Superimposed severe diverticulosis of both the descending and
sigmoid colon - both of which have indistinct bowel walls, but an
epigastric source of perforation is favored as in #1.

4. No bowel obstruction.  No evidence of pancreatitis.

5. Chronically left nephrectomy. Advanced Aortic Atherosclerosis
([id number]-G5X.X), tortuous aorta.

Study discussed by telephone with Dr. PRAMOD JEN on 08/26/2021 at

## 2023-09-07 DIAGNOSIS — I639 Cerebral infarction, unspecified: Secondary | ICD-10-CM

## 2023-09-07 HISTORY — DX: Cerebral infarction, unspecified: I63.9

## 2023-10-02 ENCOUNTER — Observation Stay (HOSPITAL_COMMUNITY)

## 2023-10-02 ENCOUNTER — Observation Stay (HOSPITAL_COMMUNITY)
Admission: EM | Admit: 2023-10-02 | Discharge: 2023-10-04 | Disposition: A | Discharge status: Home / Self Care | Attending: Internal Medicine | Admitting: Internal Medicine

## 2023-10-02 ENCOUNTER — Emergency Department (HOSPITAL_COMMUNITY)

## 2023-10-02 ENCOUNTER — Other Ambulatory Visit: Payer: Self-pay

## 2023-10-02 ENCOUNTER — Encounter (HOSPITAL_COMMUNITY): Payer: Self-pay

## 2023-10-02 DIAGNOSIS — Z79899 Other long term (current) drug therapy: Secondary | ICD-10-CM | POA: Diagnosis not present

## 2023-10-02 DIAGNOSIS — Z8553 Personal history of malignant neoplasm of renal pelvis: Secondary | ICD-10-CM | POA: Insufficient documentation

## 2023-10-02 DIAGNOSIS — E785 Hyperlipidemia, unspecified: Secondary | ICD-10-CM | POA: Diagnosis present

## 2023-10-02 DIAGNOSIS — K279 Peptic ulcer, site unspecified, unspecified as acute or chronic, without hemorrhage or perforation: Secondary | ICD-10-CM | POA: Insufficient documentation

## 2023-10-02 DIAGNOSIS — H34232 Retinal artery branch occlusion, left eye: Secondary | ICD-10-CM | POA: Diagnosis not present

## 2023-10-02 DIAGNOSIS — J439 Emphysema, unspecified: Secondary | ICD-10-CM | POA: Diagnosis not present

## 2023-10-02 DIAGNOSIS — I6623 Occlusion and stenosis of bilateral posterior cerebral arteries: Secondary | ICD-10-CM | POA: Diagnosis not present

## 2023-10-02 DIAGNOSIS — N1832 Chronic kidney disease, stage 3b: Secondary | ICD-10-CM | POA: Insufficient documentation

## 2023-10-02 DIAGNOSIS — H5462 Unqualified visual loss, left eye, normal vision right eye: Secondary | ICD-10-CM | POA: Diagnosis present

## 2023-10-02 DIAGNOSIS — I10 Essential (primary) hypertension: Secondary | ICD-10-CM | POA: Diagnosis present

## 2023-10-02 DIAGNOSIS — Z7982 Long term (current) use of aspirin: Secondary | ICD-10-CM | POA: Diagnosis not present

## 2023-10-02 DIAGNOSIS — I6389 Other cerebral infarction: Secondary | ICD-10-CM | POA: Diagnosis not present

## 2023-10-02 DIAGNOSIS — F32A Depression, unspecified: Secondary | ICD-10-CM | POA: Insufficient documentation

## 2023-10-02 DIAGNOSIS — K429 Umbilical hernia without obstruction or gangrene: Secondary | ICD-10-CM | POA: Diagnosis not present

## 2023-10-02 DIAGNOSIS — F17210 Nicotine dependence, cigarettes, uncomplicated: Secondary | ICD-10-CM | POA: Diagnosis not present

## 2023-10-02 DIAGNOSIS — R29818 Other symptoms and signs involving the nervous system: Secondary | ICD-10-CM | POA: Diagnosis not present

## 2023-10-02 DIAGNOSIS — I639 Cerebral infarction, unspecified: Principal | ICD-10-CM | POA: Diagnosis present

## 2023-10-02 DIAGNOSIS — R918 Other nonspecific abnormal finding of lung field: Secondary | ICD-10-CM | POA: Insufficient documentation

## 2023-10-02 DIAGNOSIS — I6782 Cerebral ischemia: Secondary | ICD-10-CM | POA: Diagnosis not present

## 2023-10-02 DIAGNOSIS — I6381 Other cerebral infarction due to occlusion or stenosis of small artery: Secondary | ICD-10-CM | POA: Diagnosis not present

## 2023-10-02 DIAGNOSIS — F321 Major depressive disorder, single episode, moderate: Secondary | ICD-10-CM | POA: Diagnosis present

## 2023-10-02 DIAGNOSIS — N3289 Other specified disorders of bladder: Secondary | ICD-10-CM | POA: Insufficient documentation

## 2023-10-02 DIAGNOSIS — I6523 Occlusion and stenosis of bilateral carotid arteries: Secondary | ICD-10-CM | POA: Diagnosis not present

## 2023-10-02 DIAGNOSIS — I129 Hypertensive chronic kidney disease with stage 1 through stage 4 chronic kidney disease, or unspecified chronic kidney disease: Secondary | ICD-10-CM | POA: Diagnosis not present

## 2023-10-02 DIAGNOSIS — I672 Cerebral atherosclerosis: Secondary | ICD-10-CM | POA: Diagnosis not present

## 2023-10-02 DIAGNOSIS — I700 Atherosclerosis of aorta: Secondary | ICD-10-CM | POA: Insufficient documentation

## 2023-10-02 DIAGNOSIS — I7143 Infrarenal abdominal aortic aneurysm, without rupture: Secondary | ICD-10-CM | POA: Diagnosis not present

## 2023-10-02 DIAGNOSIS — J449 Chronic obstructive pulmonary disease, unspecified: Secondary | ICD-10-CM | POA: Diagnosis not present

## 2023-10-02 DIAGNOSIS — G319 Degenerative disease of nervous system, unspecified: Secondary | ICD-10-CM | POA: Diagnosis not present

## 2023-10-02 DIAGNOSIS — R41841 Cognitive communication deficit: Secondary | ICD-10-CM | POA: Diagnosis present

## 2023-10-02 LAB — ETHANOL: Alcohol, Ethyl (B): <10 mg/dL (ref <10)

## 2023-10-02 LAB — COMPREHENSIVE METABOLIC PANEL
ALT: 14 U/L (ref 0–44)
AST: 25 U/L (ref 15–41)
Albumin: 3.9 g/dL (ref 3.5–5.0)
Alkaline Phosphatase: 67 U/L (ref 38–126)
Anion gap: 11 (ref 5–15)
BUN: 16 mg/dL (ref 8–23)
CO2: 23 mmol/L (ref 22–32)
Calcium: 9.4 mg/dL (ref 8.9–10.3)
Chloride: 104 mmol/L (ref 98–111)
Creatinine, Ser: 1.41 mg/dL — ABNORMAL HIGH (ref 0.61–1.24)
GFR, Estimated: 55 mL/min — ABNORMAL LOW (ref >60)
Glucose, Bld: 90 mg/dL (ref 70–99)
Potassium: 5 mmol/L (ref 3.5–5.1)
Sodium: 138 mmol/L (ref 135–145)
Total Bilirubin: 0.7 mg/dL (ref 0.0–1.2)
Total Protein: 7.5 g/dL (ref 6.5–8.1)

## 2023-10-02 LAB — DIFFERENTIAL
Abs Immature Granulocytes: 0.03 10*3/uL (ref 0.00–0.07)
Basophils Absolute: 0 10*3/uL (ref 0.0–0.1)
Basophils Relative: 1 %
Eosinophils Absolute: 0.1 10*3/uL (ref 0.0–0.5)
Eosinophils Relative: 1 %
Immature Granulocytes: 0 %
Lymphocytes Relative: 19 %
Lymphs Abs: 1.6 10*3/uL (ref 0.7–4.0)
Monocytes Absolute: 0.7 10*3/uL (ref 0.1–1.0)
Monocytes Relative: 8 %
Neutro Abs: 6.2 10*3/uL (ref 1.7–7.7)
Neutrophils Relative %: 71 %

## 2023-10-02 LAB — APTT: aPTT: 23 s — ABNORMAL LOW (ref 24–36)

## 2023-10-02 LAB — CBC
HCT: 42.3 % (ref 39.0–52.0)
Hemoglobin: 13.4 g/dL (ref 13.0–17.0)
MCH: 29.3 pg (ref 26.0–34.0)
MCHC: 31.7 g/dL (ref 30.0–36.0)
MCV: 92.4 fL (ref 80.0–100.0)
Platelets: 309 10*3/uL (ref 150–400)
RBC: 4.58 MIL/uL (ref 4.22–5.81)
RDW: 13.1 % (ref 11.5–15.5)
WBC: 8.6 10*3/uL (ref 4.0–10.5)
nRBC: 0 % (ref 0.0–0.2)

## 2023-10-02 LAB — PROTIME-INR
INR: 1 (ref 0.8–1.2)
Prothrombin Time: 12.9 s (ref 11.4–15.2)

## 2023-10-02 MED ORDER — SODIUM CHLORIDE 0.9 % IV BOLUS
1000.0000 mL | Freq: Once | INTRAVENOUS | Status: AC
  Administered 2023-10-02: 1000 mL via INTRAVENOUS

## 2023-10-02 MED ORDER — FLUTICASONE FUROATE-VILANTEROL 100-25 MCG/ACT IN AEPB
1.0000 | INHALATION_SPRAY | Freq: Every day | RESPIRATORY_TRACT | Status: DC
  Administered 2023-10-03 – 2023-10-04 (×2): 1 via RESPIRATORY_TRACT
  Filled 2023-10-02: qty 28

## 2023-10-02 MED ORDER — IOHEXOL 350 MG/ML SOLN
75.0000 mL | Freq: Once | INTRAVENOUS | Status: AC | PRN
  Administered 2023-10-02: 75 mL via INTRAVENOUS

## 2023-10-02 MED ORDER — ACETAMINOPHEN 650 MG RE SUPP: 650.0000 mg | RECTAL | Status: DC | PRN

## 2023-10-02 MED ORDER — CITALOPRAM HYDROBROMIDE 20 MG PO TABS
20.0000 mg | ORAL_TABLET | Freq: Every day | ORAL | Status: DC
  Administered 2023-10-02 – 2023-10-03 (×2): 20 mg via ORAL
  Filled 2023-10-02: qty 2
  Filled 2023-10-02: qty 1

## 2023-10-02 MED ORDER — ROSUVASTATIN CALCIUM 20 MG PO TABS
20.0000 mg | ORAL_TABLET | Freq: Every day | ORAL | Status: DC
  Administered 2023-10-02 – 2023-10-03 (×2): 20 mg via ORAL
  Filled 2023-10-02 (×2): qty 1

## 2023-10-02 MED ORDER — ACETAMINOPHEN 325 MG PO TABS
650.0000 mg | ORAL_TABLET | ORAL | Status: DC | PRN
  Administered 2023-10-03: 650 mg via ORAL
  Filled 2023-10-02: qty 2

## 2023-10-02 MED ORDER — IOHEXOL 350 MG/ML SOLN: 75.0000 mL | Freq: Once | INTRAVENOUS | Status: DC | PRN

## 2023-10-02 MED ORDER — CLOPIDOGREL BISULFATE 300 MG PO TABS
300.0000 mg | ORAL_TABLET | Freq: Once | ORAL | Status: AC
Start: 2023-10-02 — End: 2023-10-02
  Administered 2023-10-02: 300 mg via ORAL
  Filled 2023-10-02: qty 1

## 2023-10-02 MED ORDER — ACETAMINOPHEN 160 MG/5ML PO SOLN: 650.0000 mg | ORAL | Status: DC | PRN

## 2023-10-02 MED ORDER — ENOXAPARIN SODIUM 40 MG/0.4ML IJ SOSY
40.0000 mg | PREFILLED_SYRINGE | INTRAMUSCULAR | Status: DC
  Administered 2023-10-02 – 2023-10-03 (×2): 40 mg via SUBCUTANEOUS
  Filled 2023-10-02 (×2): qty 0.4

## 2023-10-02 MED ORDER — SENNOSIDES-DOCUSATE SODIUM 8.6-50 MG PO TABS: 1.0000 | ORAL_TABLET | Freq: Every evening | ORAL | Status: DC | PRN

## 2023-10-02 MED ORDER — UMECLIDINIUM BROMIDE 62.5 MCG/ACT IN AEPB
1.0000 | INHALATION_SPRAY | Freq: Every day | RESPIRATORY_TRACT | Status: DC
  Administered 2023-10-03 – 2023-10-04 (×2): 1 via RESPIRATORY_TRACT
  Filled 2023-10-02: qty 7

## 2023-10-02 MED ORDER — STROKE: EARLY STAGES OF RECOVERY BOOK
Freq: Once | Status: AC
  Filled 2023-10-02: qty 1

## 2023-10-02 MED ORDER — CLOPIDOGREL BISULFATE 75 MG PO TABS
75.0000 mg | ORAL_TABLET | Freq: Every day | ORAL | Status: DC
  Administered 2023-10-03: 75 mg via ORAL
  Filled 2023-10-02: qty 1

## 2023-10-02 MED ORDER — ASPIRIN 81 MG PO TBEC
81.0000 mg | DELAYED_RELEASE_TABLET | Freq: Every day | ORAL | Status: DC
  Administered 2023-10-02 – 2023-10-04 (×3): 81 mg via ORAL
  Filled 2023-10-02 (×3): qty 1

## 2023-10-02 NOTE — Consult Note (Signed)
 NEUROLOGY CONSULT NOTE   Date of service: October 02, 2023 Patient Name: Michael Brady MRN:  [id number] DOB:  Nov 18, 1955 Chief Complaint: "Vision change" Requesting Provider: Gerhard Munch, MD  History of Present Illness  Michael Brady is a 68 y.o. male with hx of tobacco abuse who presents with vision change.  He states that he woke up the morning of 3/25 noticed that his left eye had significant vision difficulty.  He states that he did see some light, but everything was hazy and he was not able to make out anything significant.  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  The patient states that his vision has improved markedly and is almost back to baseline at this point.  He denies any antecedent illness, any recent chest pain, palpitations, previous episodes.  LKW: 3/24 prior to bed Modified rankin score: 0-Completely asymptomatic and back to baseline post- stroke IV Thrombolysis: No, outside of window  NIHSS components Score: Comment  1a Level of Conscious 0[x]  1[]  2[]  3[]      1b LOC Questions 0[x]  1[]  2[]       1c LOC Commands 0[x]  1[]  2[]       2 Best Gaze 0[x]  1[]  2[]       3 Visual 0[x]  1[]  2[]  3[]      4 Facial Palsy 0[x]  1[]  2[]  3[]      5a Motor Arm - left 0[x]  1[]  2[]  3[]  4[]  UN[]    5b Motor Arm - Right 0[x]  1[]  2[]  3[]  4[]  UN[]    6a Motor Leg - Left 0[x]  1[]  2[]  3[]  4[]  UN[]    6b Motor Leg - Right 0[x]  1[]  2[]  3[]  4[]  UN[]    7 Limb Ataxia 0[x]  1[]  2[]  3[]  UN[]     8 Sensory 0[x]  1[]  2[]  UN[]      9 Best Language 0[x]  1[]  2[]  3[]      10 Dysarthria 0[x]  1[]  2[]  UN[]      11 Extinct. 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 I JUST HAD MY BLOOD DRAWN AT [facility] THIS LAST MONDAY BUT THEY HAVENT CALLED ME ABOUT IT YET"   History of closed dislocation of shoulder 06/2014   LEFT --- POST IV SEDATION CLOSED REDUCTION IN ED   PONV (postoperative nausea and vomiting)    Renal mass, left    LEFT RENAL COLLECTING SYSTEM MASS   Smokers' cough (HCC)    OCC   Wears dentures    Wears glasses     Past Surgical History:  Procedure Laterality Date   CARPAL TUNNEL RELEASE Left 01-25-2006   dr Ophelia Charter  [facility]   and LEFT THUMB PULLEY RELEASE   COLONOSCOPY N/A 05/07/2017   Procedure: COLONOSCOPY;  Surgeon: Franky Macho, MD;  [facility];  Service: Gastroenterology;  Laterality: N/A;   CYSTOSCOPY W/ RETROGRADES Right 07/25/2018   Procedure: CYSTOSCOPY WITH RETROGRADE PYELOGRAM;  Surgeon: Sebastian Ache, MD;  Location[facility];  Service: Urology;  Laterality: Right;   CYSTOSCOPY WITH URETEROSCOPY AND STENT PLACEMENT Left 07/29/2017   Procedure:  CYSTOSCOPY WITH URETEROSCOPY AND STENT PLACEMENT;  Surgeon: Hildred Laser, MD;  Location: [facility];  Service: Urology;  Laterality: Left;   CYSTOSCOPY/URETEROSCOPY/HOLMIUM LASER/STENT PLACEMENT Left 07/15/2017   Procedure: CYSTOSCOPY, ATTEMTED URETEROSCOPY/,STENT PLACEMENT;  Surgeon: Hildred Laser, MD;  Location: [facility];  Service: Urology;  Laterality: Left;   CYSTOSCOPY/URETEROSCOPY/HOLMIUM LASER/STENT PLACEMENT Left 10/21/2017   Procedure: CYSTOSCOPY, RETROGRADE /URETEROSCOPY LEFT UPPER TRACT,BIOPSY, Manning Charity LASER/STENT PLACEMENT;  Surgeon: Hildred Laser, MD;  Location: [facility];  Service: Urology;  Laterality: Left;   GASTRORRHAPHY N/A 08/26/2021   Procedure: GASTRORRHAPHY;  Surgeon: Lewie Chamber, DO;  Location: [facility];  Service: General;  Laterality: N/A;   HOLMIUM LASER APPLICATION Left 10/21/2017   Procedure: HOLMIUM LASER APPLICATION;  Surgeon: Hildred Laser, MD;  Location: [facility];  Service: Urology;  Laterality: Left;   INCISIONAL HERNIA REPAIR Right 08/26/2021   Procedure: HERNIA REPAIR INCISIONAL;  Surgeon: Lewie Chamber, DO;  Location: [facility];  Service: General;  Laterality: Right;   LAPAROTOMY N/A 08/26/2021   Procedure: EXPLORATORY LAPAROTOMY;  Surgeon: Lewie Chamber, DO;  Location: [facility];  Service: General;  Laterality: N/A;   ROBOT ASSITED LAPAROSCOPIC NEPHROURETERECTOMY Left 12/11/2017   Procedure: XI ROBOT ASSITED LAPAROSCOPIC NEPHROURETERECTOMY;  Surgeon: Sebastian Ache, MD;  Location: [facility];  Service: Urology;  Laterality: Left;   SHOULDER SURGERY Right 1997   ROTATOR CUFF REPAIR    THULIUM LASER TURP (TRANSURETHRAL RESECTION OF PROSTATE) Left 07/29/2017   Procedure: CYSTOSCOPY, LEFT URETEROSCOPY WITH TUMOR BIOPSYTHULLIUM LASER ABLATION OF TUMOR AND LEFT URETERAL STENT EXCHANGE;  Surgeon: Hildred Laser, MD;  Location: [facility];  Service: Urology;  Laterality: Left;   TRANSURETHRAL RESECTION OF BLADDER TUMOR N/A 07/25/2018   Procedure: TRANSURETHRAL RESECTION OF BLADDER TUMOR (TURBT);  Surgeon: Sebastian Ache, MD;  Location: [facility];  Service: Urology;  Laterality: N/A;   WISDOM TOOTH EXTRACTION      Family History: Family History  Problem Relation Age of Onset   Colon cancer Neg Hx     Social History  reports that he has been smoking cigarettes. He has a 60 pack-year smoking history. He has never used smokeless tobacco. He reports that he does not drink alcohol and does not use drugs.  No Known Allergies  Medications  No current facility-administered medications for this encounter.  Current Outpatient Medications:    acetaminophen (TYLENOL) 500 MG tablet, Take 2 tablets (1,000 mg total) by mouth every 6 (six) hours., Disp: 30 tablet, Rfl: 0   citalopram (CELEXA) 20 MG tablet, TAKE 1 TABLET(20 MG) BY MOUTH DAILY (Patient taking  differently: Take 20 mg by mouth at bedtime.), Disp: 90 tablet, Rfl: 3   empagliflozin (JARDIANCE) 10 MG TABS tablet, Take 1 tablet (10 mg total) by mouth daily before breakfast. (Patient taking differently: Take 10 mg by mouth at bedtime.), Disp: 90 tablet, Rfl: 3   losartan (COZAAR) 25 MG tablet, Take 1 tablet (25 mg total) by mouth daily. (Patient taking differently: Take 25 mg by mouth at bedtime.), Disp: 90 tablet, Rfl: 1   nicotine (NICODERM CQ - DOSED IN MG/24 HOURS) 21 mg/24hr patch, APPLY 1 PATCH(21 MG) TOPICALLY TO THE SKIN DAILY (Patient taking differently: Place 21 mg onto the skin daily.), Disp: 28 patch, Rfl: 6   rosuvastatin (CRESTOR) 20 MG tablet, Take 1 tablet (20 mg total) by mouth daily. (Patient taking differently: Take 20 mg by mouth at bedtime.), Disp: 90 tablet,  Rfl: 3   TRELEGY ELLIPTA 100-62.5-25 MCG/ACT AEPB, INHALE 1 PUFF INTO THE LUNGS DAILY (Patient taking differently: Inhale 1 puff into the lungs in the morning.), Disp: 60 each, Rfl: 6  Vitals   Vitals:   2023-10-27 1015 10/27/23 1130 10/27/23 1145 10/27/2023 1408  BP: 134/78 124/86 129/77   Pulse: 69 65 69   Resp: 15 13 15    Temp:    98 F (36.7 C)  TempSrc:    Axillary  SpO2: 100% 100% 99%   Weight:      Height:        Body mass index is 26.29 kg/m.  Physical Exam   Constitutional: Appears well-developed and well-nourished.  Neurologic Examination    Neuro: Mental Status: Patient is awake, alert, oriented to person, place, month, year, and situation. Patient is able to give a clear and coherent history. No signs of aphasia or neglect Cranial Nerves: II: Visual Fields are full. Pupils are pharmacologically dilated bilaterally III,IV, VI: EOMI without ptosis or diploplia.  V: Facial sensation is symmetric to temperature VII: Facial movement is symmetric.  VIII: hearing is intact to voice X: Uvula elevates symmetrically XII: tongue is midline without atrophy or fasciculations.  Motor: Tone is  normal. Bulk is normal. 5/5 strength was present in all four extremities.  Sensory: Sensation is symmetric to light touch and temperature in the arms and legs. Cerebellar: FNF intact bilaterally   Labs/Imaging/Neurodiagnostic studies   CBC:  Recent Labs  Lab Oct 27, 2023 1015  WBC 8.6  NEUTROABS 6.2  HGB 13.4  HCT 42.3  MCV 92.4  PLT 309   Basic Metabolic Panel:  Lab Results  Component Value Date   NA 138 27-Oct-2023   K 5.0 10-27-23   CO2 23 2023-10-27   GLUCOSE 90 10/27/2023   BUN 16 October 27, 2023   CREATININE 1.41 (H) 27-Oct-2023   CALCIUM 9.4 Oct 27, 2023   GFRNONAA 55 (L) 10-27-23   GFRAA >60 03/15/2020   Lipid Panel:  Lab Results  Component Value Date   LDLCALC 90 06/26/2023   HgbA1c: No results found for: "HGBA1C" Urine Drug Screen: No results found for: "LABOPIA", "COCAINSCRNUR", "LABBENZ", "AMPHETMU", "THCU", "LABBARB"  Alcohol Level     Component Value Date/Time   ETH <10 27-Oct-2023 1015   INR  Lab Results  Component Value Date   INR 1.0 10/27/2023   APTT  Lab Results  Component Value Date   APTT 23 (L) 10/27/2023   AED levels: No results found for: "PHENYTOIN", "ZONISAMIDE", "LAMOTRIGINE", "LEVETIRACETA"  MRI Brain(Personally reviewed): 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  His MRI also suggests small area of ischemia intracranially.  He will need to be admitted for secondary risk factor evaluation and modification.  RECOMMENDATIONS  - HgbA1c, fasting lipid panel - Frequent neuro checks - Echocardiogram - CTA head and neck - Prophylactic therapy-Antiplatelet med: Aspirin - dose 81mg  and plavix 75mg  daily  after 300mg  load  - Risk factor modification - Telemetry monitoring - PT consult, OT consult, Speech consult - Stroke team to  follow  ______________________________________________________________________    Signed, Ritta Slot, MD [facility]

## 2023-10-02 NOTE — ED Provider Notes (Signed)
 [place] EMER[facility]ENC[facility] DEPARTMENT AT [facility] Provider Note   CSN: [id number] Arrival date & time: 10/02/23  0935     History  Chief Complaint  Patient presents with   Loss of Vision    Michael Brady is a 68 y.o. male.  HPI Patient presents with 1 of left vision changes.  He was sent here by his ophthalmologist.  He notes that he awoke yesterday, more than 24 hours ago with left vision loss.  No other weakness, pain, complaints at that time.  Vision is slowly improved since then, and he now has difficulty seeing the superior visual field in the left eye.  Today with those concerns he went to his ophthalmologist.  There he had a dilated eye exam, and was concern for possible embolism he was sent here.  Patient states that he feels otherwise in his usual state of health.    Home Medications Prior to Admission medications   Medication Sig Start Date End Date Taking? Authorizing Provider  acetaminophen (TYLENOL) 500 MG tablet Take 2 tablets (1,000 mg total) by mouth every 6 (six) hours. 08/31/21  Yes Shah, Pratik D, DO  citalopram (CELEXA) 20 MG tablet TAKE 1 TABLET(20 MG) BY MOUTH DAILY Patient taking differently: Take 20 mg by mouth at bedtime. 02/25/23  Yes Cook, Jayce G, DO  empagliflozin (JARDIANCE) 10 MG TABS tablet Take 1 tablet (10 mg total) by mouth daily before breakfast. Patient taking differently: Take 10 mg by mouth at bedtime. 07/14/23  Yes Cook, Jayce G, DO  losartan (COZAAR) 25 MG tablet Take 1 tablet (25 mg total) by mouth daily. Patient taking differently: Take 25 mg by mouth at bedtime. 06/26/23  Yes Cook, Jayce G, DO  nicotine (NICODERM CQ - DOSED IN MG/24 HOURS) 21 mg/24hr patch APPLY 1 PATCH(21 MG) TOPICALLY TO THE SKIN DAILY Patient taking differently: Place 21 mg onto the skin daily. 04/19/23  Yes Cook, Jayce G, DO  rosuvastatin (CRESTOR) 20 MG tablet Take 1 tablet (20 mg total) by mouth daily. Patient taking differently: Take 20 mg by mouth at bedtime.  01/03/23  Yes Cook, Jayce G, DO  TRELEGY ELLIPTA 100-62.5-25 MCG/ACT AEPB INHALE 1 PUFF INTO THE LUNGS DAILY Patient taking differently: Inhale 1 puff into the lungs in the morning. 07/22/23  Yes [name], DO      Allergies    Patient has no known allergies.    Review of Systems   Review of Systems  Physical Exam Updated Vital Signs BP 129/77   Pulse 69   Temp 98 F (36.7 C) (Axillary)   Resp 15   Ht 5\' 9"  (1.753 m)   Wt 80.7 kg   SpO2 99%   BMI 26.29 kg/m  Physical Exam Vitals and nursing note reviewed.  Constitutional:      General: He is not in acute distress.    Appearance: He is well-developed.  HENT:     Head: Normocephalic and atraumatic.  Eyes:     Conjunctiva/sclera: Conjunctivae normal.   Cardiovascular:     Rate and Rhythm: Normal rate and regular rhythm.  Pulmonary:     Effort: Pulmonary effort is normal. No respiratory distress.     Breath sounds: No stridor.  Abdominal:     General: There is no distension.  Skin:    General: Skin is warm and dry.  Neurological:     Mental Status: He is alert and oriented to person, place, and time.     Comments: Beyond  left vision deficit patient has no focal neurodeficits.     ED Results / Procedures / Treatments   Labs (all labs ordered are listed, but only abnormal results are displayed) Labs Reviewed  APTT - Abnormal; Notable for the following components:      Result Value   aPTT 23 (*)    All other components within normal limits  COMPREHENSIVE METABOLIC PANEL - Abnormal; Notable for the following components:   Creatinine, Ser 1.41 (*)    GFR, Estimated 55 (*)    All other components within normal limits  PROTIME-INR  CBC  DIFFERENTIAL  ETHANOL    EKG EKG Interpretation Date/Time:  Wednesday October 02 2023 09:54:38 EDT Ventricular Rate:  63 PR Interval:  160 QRS Duration:  84 QT Interval:  417 QTC Calculation: 427 R Axis:   41  Text Interpretation: Sinus rhythm Baseline wander Confirmed  by Gerhard Munch [phone]) on 10/02/2023 9:56:44 AM  Radiology MR BRAIN WO CONTRAST Result Date: 10/02/2023 CLINICAL DATA:  Neuro deficit, acute, stroke suspected. Blurry vision. EXAM: MRI HEAD WITHOUT CONTRAST TECHNIQUE: Multiplanar, multiecho pulse sequences of the brain and surrounding structures were obtained without intravenous contrast. COMPARISON:  None Available. FINDINGS: Brain: There is a 7 x 2 mm acute infarct involving the posterior left caudate body. There is no evidence of intracranial hemorrhage, mass, midline shift, or extra-axial fluid collection. Scattered small T2 hyperintensities in the cerebral white matter bilaterally are nonspecific but compatible with mildly age advanced chronic small vessel ischemic disease. Chronic lacunar infarcts are noted in the bilateral basal ganglia and left thalamus as well as right cerebellar hemisphere. There is mild cerebral atrophy. Vascular: Major intracranial vascular flow voids are preserved. Skull and upper cervical spine: Unremarkable bone marrow signal para Sinuses/Orbits: Unremarkable orbits. Paranasal sinuses and mastoid air cells are clear. Other: None. IMPRESSION: 1. Small acute left caudate infarct. 2. Mildly age advanced chronic small vessel ischemic disease. Electronically Signed   By: Sebastian Ache M.D.   On: 10/02/2023 12:56    Procedures Procedures    Medications Ordered in ED Medications  clopidogrel (PLAVIX) tablet 300 mg (has no administration in time range)    And  clopidogrel (PLAVIX) tablet 75 mg (has no administration in time range)  iohexol (OMNIPAQUE) 350 MG/ML injection 75 mL (75 mLs Intravenous Contrast Given 10/02/23 1356)    ED Course/ Medical Decision Making/ A&P                                 Medical Decision Making Patient presents with acute change in his left vision, concern for CVA.  Additional concerns including retinal detachment, hemorrhage have likely been excluded by his dilated exam performed by  ophthalmology just prior to ED arrival.  Patient is other neuro exam is reassuring, patient had labs ECG monitoring, discussion with neurology after arrival to discuss imaging modality. Cardiac 75 sinus normal Pulse ox 95% room air normal  Amount and/or Complexity of Data Reviewed External Data Reviewed: notes.    Details: Ophthalmology referral note Labs: ordered. Decision-making details documented in ED Course. Radiology: ordered and independent interpretation performed. Decision-making details documented in ED Course. ECG/medicine tests: ordered and independent interpretation performed. Decision-making details documented in ED Course.  Risk Prescription drug management. Decision regarding hospitalization.   3:07 PM Patient in similar condition.  Case discussed again with our neurology colleagues given concern for stroke, and indeed on MRI, there is acute left caudate infarct.  Patient awaiting additional imaging, but will require admission for ongoing monitoring, management of acute new stroke.    Addendum: After plan for admission was discussed with neurology, additional imaging for stroke workup including CTA ordered.  These results were available and I discussed them with the patient, his daughter, and his wife.  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  Internal medicine team aware as well.     Final Clinical Impression(s) / ED Diagnoses Final diagnoses:  Acute CVA (cerebrovascular accident) [facility])    Rx / DC Orders ED Discharge Orders     None         Gerhard Munch, MD 10/02/23 1507    Gerhard Munch, MD 10/03/23 0730

## 2023-10-02 NOTE — H&P (Signed)
 History and Physical   Michael Brady ZOX:[id number] DOB: 09/01/1955 DOA: 10/02/2023  PCP: [name], DO   Patient coming from: Home/ophthalmologist  Chief Complaint: Vision loss  HPI: Michael Brady is a 68 y.o. male with medical history significant of hypertension, hyperlipidemia, PUD, COPD, depression, history of renal pelvis cancer presenting with vision loss.  Patient reports that he woke up yesterday with left-sided vision loss.  No other complaints at that time.  Vision slowly improved until he had only left upper field vision loss in the left eye.  He went to his ophthalmologist today who did an eye exam and had concern for embolism and sent patient to the ED for further evaluation.  Patient denies fevers, chills, chest pain, shortness of breath, abdominal pain, constipation, diarrhea, nausea, vomiting.  ED Course: Vital signs in the ED notable for blood pressure in the 120s to 150s systolic.  Lab workup included CMP with creatinine near baseline at 1.4.  CBC within normal limits.  PT and INR normal.  Ethanol level negative.  MRI brain positive for acute CVA.  CTA of the head and neck showed right upper lobe spiculated lung mass concerning for carcinoma.  Emphysema also noted.  No large vessel occlusion.  Positive for widespread atherosclerosis.  Moderate stenosis at the vertebral arteries and right M1.  Right RCA noted to have moderate to severe stenosis.  Patient received Plavix in the ED.  Neurology consulted and recommended stroke workup including above CTA head and neck.  Review of Systems: As per HPI otherwise all other systems reviewed and are negative.  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 I JUST HAD MY BLOOD DRAWN AT [facility] THIS  LAST MONDAY BUT THEY HAVENT CALLED ME ABOUT IT YET"   History of closed dislocation of shoulder 06/2014   LEFT --- POST IV SEDATION CLOSED REDUCTION IN ED   PONV (postoperative nausea and vomiting)    Renal mass, left    LEFT RENAL COLLECTING SYSTEM MASS   Smokers' cough (HCC)    OCC   Wears dentures    Wears glasses     Past Surgical History:  Procedure Laterality Date   CARPAL TUNNEL RELEASE Left 01-25-2006   dr Ophelia Charter  [facility]   and LEFT THUMB PULLEY RELEASE   COLONOSCOPY N/A 05/07/2017   Procedure: COLONOSCOPY;  Surgeon: Franky Macho, MD;  [facility];  Service: Gastroenterology;  Laterality: N/A;   CYSTOSCOPY W/ RETROGRADES Right 07/25/2018   Procedure: CYSTOSCOPY WITH RETROGRADE PYELOGRAM;  Surgeon: Sebastian Ache, MD;  Location: [facility];  Service: Urology;  Laterality: Right;   CYSTOSCOPY WITH URETEROSCOPY AND STENT PLACEMENT Left 07/29/2017   Procedure: CYSTOSCOPY WITH URETEROSCOPY AND STENT PLACEMENT;  Surgeon: Hildred Laser, MD;  Location: [facility];  Service: Urology;  Laterality: Left;   CYSTOSCOPY/URETEROSCOPY/HOLMIUM LASER/STENT PLACEMENT Left 07/15/2017   Procedure: CYSTOSCOPY, ATTEMTED URETEROSCOPY/,STENT PLACEMENT;  Surgeon: Hildred Laser, MD;  Location: [facility];  Service: Urology;  Laterality: Left;   CYSTOSCOPY/URETEROSCOPY/HOLMIUM LASER/STENT PLACEMENT Left 10/21/2017   Procedure: CYSTOSCOPY, RETROGRADE /URETEROSCOPY LEFT UPPER TRACT,BIOPSY, [place] Charity LASER/STENT PLACEMENT;  Surgeon: Hildred Laser, MD;  Location: [facility];  Service: Urology;  Laterality: Left;   GASTRORRHAPHY N/A 08/26/2021   Procedure: GASTRORRHAPHY;  Surgeon: Lewie Chamber, DO;  Location: [facility];  Service: General;  Laterality: N/A;   HOLMIUM LASER APPLICATION Left 10/21/2017   Procedure: HOLMIUM LASER APPLICATION;  Surgeon: Hildred Laser, MD;  Location: [facility];  Service:  Urology;  Laterality: Left;   INCISIONAL HERNIA REPAIR Right 08/26/2021   Procedure: HERNIA REPAIR INCISIONAL;  Surgeon: Lewie Chamber, DO;  Location: [facility];  Service: General;  Laterality: Right;   LAPAROTOMY N/A 08/26/2021   Procedure: EXPLORATORY LAPAROTOMY;  Surgeon: Lewie Chamber, DO;  Location[facility];  Service: General;  Laterality: N/A;   ROBOT ASSITED LAPAROSCOPIC NEPHROURETERECTOMY Left 12/11/2017   Procedure: XI ROBOT ASSITED LAPAROSCOPIC NEPHROURETERECTOMY;  Surgeon: Sebastian Ache, MD;  Location: [facility];  Service: Urology;  Laterality: Left;   SHOULDER SURGERY Right 1997   ROTATOR CUFF REPAIR    THULIUM LASER TURP (TRANSURETHRAL RESECTION OF PROSTATE) Left 07/29/2017   Procedure: CYSTOSCOPY, LEFT URETEROSCOPY WITH TUMOR BIOPSYTHULLIUM LASER ABLATION OF TUMOR AND LEFT URETERAL STENT EXCHANGE;  Surgeon: Hildred Laser, MD;  [facility];  Service: Urology;  Laterality: Left;   TRANSURETHRAL RESECTION OF BLADDER TUMOR N/A 07/25/2018   Procedure: TRANSURETHRAL RESECTION OF BLADDER TUMOR (TURBT);  Surgeon: Sebastian Ache, MD;  Location: [facility];  Service: Urology;  Laterality: N/A;   WISDOM TOOTH EXTRACTION      Social History  reports that he has been smoking cigarettes. He has a 60 pack-year smoking history. He has never used smokeless tobacco. He reports that he does not drink alcohol and does not use drugs.  No Known Allergies  Family History  Problem Relation Age of Onset   Colon cancer Neg Hx   Reviewed on admission  Prior to Admission medications   Medication Sig Start Date End Date Taking? Authorizing Provider  acetaminophen (TYLENOL) 500 MG tablet Take 2 tablets (1,000 mg total) by mouth every 6 (six) hours. 08/31/21  Yes Shah, Pratik D, DO  citalopram (CELEXA) 20 MG tablet TAKE 1 TABLET(20 MG) BY MOUTH DAILY Patient taking differently: Take 20 mg by mouth at bedtime. 02/25/23  Yes Cook, Jayce G, DO   empagliflozin (JARDIANCE) 10 MG TABS tablet Take 1 tablet (10 mg total) by mouth daily before breakfast. Patient taking differently: Take 10 mg by mouth at bedtime. 07/14/23  Yes Cook, Jayce G, DO  losartan (COZAAR) 25 MG tablet Take 1 tablet (25 mg total) by mouth daily. Patient taking differently: Take 25 mg by mouth at bedtime. 06/26/23  Yes Cook, Jayce G, DO  nicotine (NICODERM CQ - DOSED IN MG/24 HOURS) 21 mg/24hr patch APPLY 1 PATCH(21 MG) TOPICALLY TO THE SKIN DAILY Patient taking differently: Place 21 mg onto the skin daily. 04/19/23  Yes Cook, Jayce G, DO  rosuvastatin (CRESTOR) 20 MG tablet Take 1 tablet (20 mg total) by mouth daily. Patient taking differently: Take 20 mg by mouth at bedtime. 01/03/23  Yes Cook, Jayce G, DO  TRELEGY ELLIPTA 100-62.5-25 MCG/ACT AEPB INHALE 1 PUFF INTO THE LUNGS DAILY Patient taking differently: Inhale 1 puff into the lungs in the morning. 07/22/23  Yes [name], DO    Physical Exam: Vitals:   10/02/23 1130 10/02/23 1145 10/02/23 1408 10/02/23 1519  BP: 124/86 129/77  127/78  Pulse: 65 69  72  Resp: 13 15  20   Temp:   98 F (36.7 C) 98.1 F (  36.7 C)  TempSrc:   Axillary Oral  SpO2: 100% 99%  95%  Weight:      Height:        Physical Exam Constitutional:      General: He is not in acute distress.    Appearance: Normal appearance.  HENT:     Head: Normocephalic and atraumatic.     Mouth/Throat:     Mouth: Mucous membranes are moist.     Pharynx: Oropharynx is clear.  Eyes:     Extraocular Movements: Extraocular movements intact.     Pupils: Pupils are equal, round, and reactive to light.  Cardiovascular:     Rate and Rhythm: Normal rate and regular rhythm.     Pulses: Normal pulses.     Heart sounds: Normal heart sounds.  Pulmonary:     Effort: Pulmonary effort is normal. No respiratory distress.     Breath sounds: Normal breath sounds.  Abdominal:     General: Bowel sounds are normal. There is no distension.     Palpations:  Abdomen is soft.     Tenderness: There is no abdominal tenderness.  Musculoskeletal:        General: No swelling or deformity.  Skin:    General: Skin is warm and dry.  Neurological:     Comments: Mental Status: Patient is awake, alert, oriented x3 No signs of aphasia or neglect Cranial Nerves: II: Pupils equal, round, decreased vision left upper visual field left eye. III,IV, VI: EOMI without ptosis or diploplia.  V: Facial sensation is symmetric to light touch. VII: Facial movement is symmetric.  VIII: hearing is intact to voice X: Uvula elevates symmetrically XI: Shoulder shrug is symmetric. XII: tongue is midline without atrophy or fasciculations.  Motor: Good effort thorughout, at Least 5/5 bilateral UE, 5/5 bilateral lower extremitiy  Sensory: Sensation is grossly intact bilateral UEs & LEs    Labs on Admission: I have personally reviewed following labs and imaging studies  CBC: Recent Labs  Lab 10/02/23 1015  WBC 8.6  NEUTROABS 6.2  HGB 13.4  HCT 42.3  MCV 92.4  PLT 309    Basic Metabolic Panel: Recent Labs  Lab 10/02/23 1015  NA 138  K 5.0  CL 104  CO2 23  GLUCOSE 90  BUN 16  CREATININE 1.41*  CALCIUM 9.4    GFR: Estimated Creatinine Clearance: 51.5 mL/min (A) (by C-G formula based on SCr of 1.41 mg/dL (H)).  Liver Function Tests: Recent Labs  Lab 10/02/23 1015  AST 25  ALT 14  ALKPHOS 67  BILITOT 0.7  PROT 7.5  ALBUMIN 3.9    Urine analysis:    Component Value Date/Time   COLORURINE YELLOW 09/06/2021 1715   APPEARANCEUR CLEAR 09/06/2021 1715   LABSPEC 1.030 09/06/2021 1715   PHURINE 5.0 09/06/2021 1715   GLUCOSEU NEGATIVE 09/06/2021 1715   HGBUR MODERATE (A) 09/06/2021 1715   BILIRUBINUR NEGATIVE 09/06/2021 1715   KETONESUR NEGATIVE 09/06/2021 1715   PROTEINUR 100 (A) 09/06/2021 1715   NITRITE NEGATIVE 09/06/2021 1715   LEUKOCYTESUR NEGATIVE 09/06/2021 1715    Radiological Exams on Admission: CT ANGIO HEAD NECK W WO  CM Result Date: 10/02/2023 CLINICAL DATA:  68 year old male with left eye vision loss. Small left caudate lacunar infarct on MRI this morning. EXAM: CT ANGIOGRAPHY HEAD AND NECK WITH AND WITHOUT CONTRAST TECHNIQUE: Multidetector CT imaging of the head and neck was performed using the standard protocol during bolus administration of intravenous contrast. Multiplanar CT image reconstructions and MIPs  were obtained to evaluate the vascular anatomy. Carotid stenosis measurements (when applicable) are obtained utilizing NASCET criteria, using the distal internal carotid diameter as the denominator. RADIATION DOSE REDUCTION: This exam was performed according to the departmental dose-optimization program which includes automated exposure control, adjustment of the mA and/or kV according to patient size and/or use of iterative reconstruction technique. CONTRAST:  75mL OMNIPAQUE IOHEXOL 350 MG/ML SOLN COMPARISON:  Brain MRI 1159 hours today. FINDINGS: CT HEAD Brain: Subtle posterior left caudate nucleus diffusion abnormality on MRI is occult by CT. No midline shift, ventriculomegaly, mass effect, evidence of mass lesion, intracranial hemorrhage or evidence of cortically based acute infarction. Calvarium and skull base: Intact. Paranasal sinuses: Visualized paranasal sinuses and mastoids are clear. Orbits: Visualized orbits and scalp soft tissues are within normal limits. CTA NECK Skeleton: Absent dentition. Cervical spine degeneration. No acute osseous abnormality identified. Upper chest: Emphysema, moderate to severe. Spiculated right upper lobe lung mass, nearly 3 cm long axis on series 12, image 58 (roughly 17 by 29 by 18 mm AP by transverse by CC). Retained secretions in the lower trachea. No visible mediastinal lymphadenopathy, although conspicuous right paratracheal lymph nodes measuring 6 mm. Other neck: Asymmetry of the larynx suggesting right vocal cord paresis or paralysis. No neck mass or lymphadenopathy  identified. Aortic arch: Soft and calcified arch atherosclerosis. Three vessel arch configuration. Right carotid system: Brachiocephalic artery soft and calcified plaque without stenosis. Proximal right CCA obscured by dense right subclavian venous contrast streak artifact. Otherwise negative right CCA. Soft and calcified plaque at the right ICA origin and bulb is mild and without stenosis. Tortuous right ICA below the skull base. Left carotid system: Left CCA origin, intermittent soft and calcified plaque which is up to moderate at the level of the subglottic larynx (series 11, image 134), up to 50% stenosis results. Soft and calcified plaque in the distal left CCA without stenosis. Moderate soft and calcified plaque at the left ICA origin and bulb with less than 50 % stenosis with respect to the distal vessel. Tortuosity below the skull base. Vertebral arteries: Right subclavian origin plaque without stenosis. Normal right vertebral artery origin. Non dominant right vertebral artery is patent to the skull base with no significant plaque or stenosis. Proximal left subclavian artery soft more so than calcified plaque without stenosis. But calcified plaque at the left vertebral artery origin resulting in moderate stenosis. The left vertebral is dominant, mildly dolichoectatic. Additional soft and calcified plaque in the proximal V2 and the distal V3 segments, but no other hemodynamically significant left vertebral stenosis to the skull base. CTA HEAD Posterior circulation: Non dominant right vertebral artery terminates in PICA. Left vertebral supplies the basilar with up to moderate atherosclerotic stenosis at the vertebrobasilar junction series 15, image 34. Left PICA is patent. Tortuous basilar artery is patent without stenosis. Patent basilar tip. SCA and PCA origins are patent but there is moderate to severe stenosis of the right PCA origin on series 15, image 38. Up to moderate stenosis of the proximal left SCA.  Posterior communicating arteries are diminutive or absent. Beaded appearance and moderate long segment stenosis of the left PCA P2 on series 16, image 32. Contralateral mild to moderate right P1 and P2 segment irregularity and stenosis. Maintained distal PCA enhancement. Anterior circulation: Both ICA siphons are patent. Calcified left siphon plaque without stenosis. Both siphons are ectatic. Calcified right siphon plaque without stenosis. Patent carotid termini, MCA and ACA origins. Mildly dominant right A1. Anterior communicating artery and bilateral  ACA branches are within normal limits. Mild irregularity and stenosis at the left MCA origin. Left MCA M1 and bifurcation are tad ache without additional stenosis. Left MCA branches are tortuous, without stenosis. Contralateral right MCA distal M1 irregularity and stenosis is moderate on series 15, image 42. Right MCA bifurcation is ectatic and patent without stenosis. Right MCA branches are tortuous, without stenosis. Venous sinuses: Early contrast timing. Superior sagittal sinus is patent. Anatomic variants: Dominant left vertebral artery supplies the basilar, the right terminates in PICA. Dominant right ACA A1. Review of the MIP images confirms the above findings IMPRESSION: 1. Right upper lobe Spiculated Lung Mass superimposed on Emphysema (ICD10-J43.9) is nearly 3 cm and considered Bronchogenic Carcinoma until proven otherwise. PET-CT and referral to [facility]) would be most valuable. 2. CTA is Negative for large vessel occlusion, positive for widespread atherosclerosis, and positive for widespread intracranial arterial dolichoectasia. - Moderate stenosis at both the origin of the Dominant Left Vertebral Artery and at its Vertebrobasilar junction. Right vertebral terminates in PICA. - Moderate stenosis of the Right M1 segment. Mild left MCA origin stenosis. - Moderate to Severe stenosis of the Right PCA origin. And up to  stenoses of bilateral P1 and P2 segments. 3.  No new intracranial abnormality since the MRI this morning. 4. Aortic Atherosclerosis (ICD10-I70.0). Electronically Signed   By: Odessa Fleming M.D.   On: 10/02/2023 15:38   MR BRAIN WO CONTRAST Result Date: 10/02/2023 CLINICAL DATA:  Neuro deficit, acute, stroke suspected. Blurry vision. EXAM: MRI HEAD WITHOUT CONTRAST TECHNIQUE: Multiplanar, multiecho pulse sequences of the brain and surrounding structures were obtained without intravenous contrast. COMPARISON:  None Available. FINDINGS: Brain: There is a 7 x 2 mm acute infarct involving the posterior left caudate body. There is no evidence of intracranial hemorrhage, mass, midline shift, or extra-axial fluid collection. Scattered small T2 hyperintensities in the cerebral white matter bilaterally are nonspecific but compatible with mildly age advanced chronic small vessel ischemic disease. Chronic lacunar infarcts are noted in the bilateral basal ganglia and left thalamus as well as right cerebellar hemisphere. There is mild cerebral atrophy. Vascular: Major intracranial vascular flow voids are preserved. Skull and upper cervical spine: Unremarkable bone marrow signal para Sinuses/Orbits: Unremarkable orbits. Paranasal sinuses and mastoid air cells are clear. Other: None. IMPRESSION: 1. Small acute left caudate infarct. 2. Mildly age advanced chronic small vessel ischemic disease. Electronically Signed   By: Sebastian Ache M.D.   On: 10/02/2023 12:56   EKG: Independently reviewed.  Sinus rhythm at 63 bpm.  Nonspecific T wave flattening.  Baseline wander V2.  Assessment/Plan Principal Problem:   Acute CVA (cerebrovascular accident) (HCC) Active Problems:   Hyperlipidemia   Depression, major, single episode, moderate (HCC)   History of renal pelvis cancer   PUD (peptic ulcer disease)   COPD (chronic obstructive pulmonary disease) (HCC)   Essential hypertension   Acute CVA > Patient presenting after left eye  vision loss starting yesterday.  Seen by ophthalmologist who noted evidence of embolism and sent patient to the ED. > Stroke confirmed on MRI in the ED and neurology consulted and are following. > CTA head neck showed no large vessel occlusion.  Did show widespread atherosclerosis and moderate to severe stenosis in multiple vessels including severe changes at the right PCA. > Incidental finding concerning for right upper lobe lung mass, could be contributing to a hypercoagulable state. > Neurology consulted in the ED and are following. 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 > Patient has some history of renal pelvis cancer last seen by urology in 2020. - CT chest abdomen and pelvis with contrast to evaluate for metastases or alternative primary. - Further workup pending best option for biopsy  Hypertension - Permissive hypertension as above  Hyperlipidemia - Continue home rosuvastatin  Depression - Continue home Celexa  COPD - Replace home Trelegy with formulary Breo and Incruse  DVT prophylaxis: Lovenox Code Status:   Full Family Communication:  Updated at bedside Disposition Plan:   Patient is from:  Home  Anticipated DC to:  Home  Anticipated DC date:  1 to 3 days  Anticipated DC barriers: None  Consults called:  Neurology Admission status:  Observation, telemetry  Severity of Illness: The appropriate patient status for this patient is OBSERVATION. Observation status is judged to be reasonable and necessary in order to provide the required intensity of service to ensure the patient's safety. The patient's presenting symptoms, physical  exam findings, and initial radiographic and laboratory data in the context of their medical condition is felt to place them at decreased risk for further clinical deterioration. Furthermore, it is anticipated that the patient will be medically stable for discharge from the hospital within 2 midnights of admission.   Synetta Fail MD [facility]  How to contact the [facility] Attending or Consulting provider 7A - 7P or covering provider during after hours 7P -7A, for this patient?   Check the care team in [facility] and look for a) attending/consulting [facility] provider listed and b) the [facility] team listed Log into [email or web address] and use [place]'s universal password to access. If you do not have the password, please contact the hospital operator. Locate the [facility] provider you are looking for under [facility] and page to a number that you can be directly reached. If you still have difficulty reaching the provider, please page the [facility] (Director on Call) for the Hospitalists listed on amion for assistance.  10/02/2023, 4:08 PM

## 2023-10-02 NOTE — ED Triage Notes (Signed)
 Pt lost vision in left eye 10/01/23 04:30. Pt says currently his vision is improving. It is just cloudy at the top.   Pt was seen by Dr. Jeannine Boga to verify he didn't have a stroke.

## 2023-10-02 NOTE — Progress Notes (Signed)
 Patient passed swallow screen.  Ordered heart healthy diet.  Tereasa Coop, MD [facility] 10/02/2023, 9:34 PM

## 2023-10-02 NOTE — ED Notes (Signed)
 Nt called CCMD to put pt on monitor

## 2023-10-03 ENCOUNTER — Observation Stay (HOSPITAL_BASED_OUTPATIENT_CLINIC_OR_DEPARTMENT_OTHER)

## 2023-10-03 DIAGNOSIS — I639 Cerebral infarction, unspecified: Secondary | ICD-10-CM | POA: Diagnosis not present

## 2023-10-03 DIAGNOSIS — E785 Hyperlipidemia, unspecified: Secondary | ICD-10-CM | POA: Diagnosis not present

## 2023-10-03 DIAGNOSIS — R918 Other nonspecific abnormal finding of lung field: Secondary | ICD-10-CM | POA: Diagnosis not present

## 2023-10-03 DIAGNOSIS — J449 Chronic obstructive pulmonary disease, unspecified: Secondary | ICD-10-CM | POA: Diagnosis not present

## 2023-10-03 DIAGNOSIS — I10 Essential (primary) hypertension: Secondary | ICD-10-CM

## 2023-10-03 DIAGNOSIS — G459 Transient cerebral ischemic attack, unspecified: Secondary | ICD-10-CM | POA: Diagnosis not present

## 2023-10-03 DIAGNOSIS — F321 Major depressive disorder, single episode, moderate: Secondary | ICD-10-CM | POA: Diagnosis not present

## 2023-10-03 DIAGNOSIS — K279 Peptic ulcer, site unspecified, unspecified as acute or chronic, without hemorrhage or perforation: Secondary | ICD-10-CM | POA: Diagnosis not present

## 2023-10-03 LAB — CBC
HCT: 39.6 % (ref 39.0–52.0)
Hemoglobin: 12.9 g/dL — ABNORMAL LOW (ref 13.0–17.0)
MCH: 29.3 pg (ref 26.0–34.0)
MCHC: 32.6 g/dL (ref 30.0–36.0)
MCV: 90 fL (ref 80.0–100.0)
Platelets: 302 10*3/uL (ref 150–400)
RBC: 4.4 MIL/uL (ref 4.22–5.81)
RDW: 13 % (ref 11.5–15.5)
WBC: 8.8 10*3/uL (ref 4.0–10.5)
nRBC: 0 % (ref 0.0–0.2)

## 2023-10-03 LAB — LIPID PANEL
Cholesterol: 114 mg/dL (ref 0–200)
HDL: 33 mg/dL — ABNORMAL LOW (ref >40)
LDL Cholesterol: 52 mg/dL (ref 0–99)
Total CHOL/HDL Ratio: 3.5 ratio
Triglycerides: 144 mg/dL (ref <150)
VLDL: 29 mg/dL (ref 0–40)

## 2023-10-03 LAB — COMPREHENSIVE METABOLIC PANEL WITH GFR
ALT: 15 U/L (ref 0–44)
AST: 20 U/L (ref 15–41)
Albumin: 3.5 g/dL (ref 3.5–5.0)
Alkaline Phosphatase: 66 U/L (ref 38–126)
Anion gap: 10 (ref 5–15)
BUN: 14 mg/dL (ref 8–23)
CO2: 21 mmol/L — ABNORMAL LOW (ref 22–32)
Calcium: 9 mg/dL (ref 8.9–10.3)
Chloride: 104 mmol/L (ref 98–111)
Creatinine, Ser: 1.35 mg/dL — ABNORMAL HIGH (ref 0.61–1.24)
GFR, Estimated: 58 mL/min — ABNORMAL LOW (ref >60)
Glucose, Bld: 98 mg/dL (ref 70–99)
Potassium: 4 mmol/L (ref 3.5–5.1)
Sodium: 135 mmol/L (ref 135–145)
Total Bilirubin: 0.8 mg/dL (ref 0.0–1.2)
Total Protein: 6.9 g/dL (ref 6.5–8.1)

## 2023-10-03 LAB — HEMOGLOBIN A1C
Hgb A1c MFr Bld: 5.5 % (ref 4.8–5.6)
Mean Plasma Glucose: 111.15 mg/dL

## 2023-10-03 LAB — HIV ANTIBODY (ROUTINE TESTING W REFLEX): HIV Screen 4th Generation wRfx: NONREACTIVE

## 2023-10-03 MED ORDER — PANTOPRAZOLE SODIUM 40 MG PO TBEC
40.0000 mg | DELAYED_RELEASE_TABLET | Freq: Every day | ORAL | Status: DC
  Administered 2023-10-03 – 2023-10-04 (×2): 40 mg via ORAL
  Filled 2023-10-03 (×2): qty 1

## 2023-10-03 MED ORDER — IPRATROPIUM-ALBUTEROL 0.5-2.5 (3) MG/3ML IN SOLN: 3.0000 mL | RESPIRATORY_TRACT | Status: DC | PRN

## 2023-10-03 MED ORDER — NICOTINE 21 MG/24HR TD PT24
21.0000 mg | MEDICATED_PATCH | Freq: Every day | TRANSDERMAL | Status: DC
Start: 2023-10-03 — End: 2023-10-04
  Administered 2023-10-03 – 2023-10-04 (×2): 21 mg via TRANSDERMAL
  Filled 2023-10-03 (×2): qty 1

## 2023-10-03 NOTE — Progress Notes (Signed)
  Echocardiogram 2D Echocardiogram has been performed.  Michael Brady 10/03/2023, 5:35 PM

## 2023-10-03 NOTE — TOC Initial Note (Signed)
 Transition of Care [facility]) - Initial/Assessment Note    Patient Details  Name: Michael Brady MRN: [id number] Date of Birth: August 24, 1955  Transition of Care [facility]) CM/SW Contact:    Alesia Richards, RN 10/03/2023, 4:42 PM  Clinical Narrative:                  CM to patient's room regarding TOC screening assessment. CM introduced case management role and discharge care planning process. Patient verbalized understanding and agreement to [facility] screening interview. Patient lives with wife and 1 dog--vaccinated. Patient does not use DME. Patient is employed full time. Per patient smokes 10-12 cigarettes a day fro 51 years. Per patient, last cigarette was yesterday; patient smoked three cigarettes on yesterday. Per patient is interested in smoking cessation. Per patient, no home health/SNF/acute rehab experience. Per patient, uses 3 steps to enter residence, handrails x 2. Per patient, no falls in last 6 months. Per patient, last PCP visit with Dr. Everlene Other was 3 months ago. Patient's wife, Michael Brady, is at bedside.  Expected Discharge Plan: Home/Self Care Barriers to Discharge: Continued Medical Work up   Patient Goals and CMS Choice   To go home   Expected Discharge Plan and Services   Discharge Planning Services: CM Consult   Living arrangements for the past 2 months: Single Family Home                   Prior Living Arrangements/Services Living arrangements for the past 2 months: Single Family Home Lives with:: Spouse, Pets Patient language and need for interpreter reviewed:: No Do you feel safe going back to the place where you live?: Yes      Need for Family Participation in Patient Care: Yes (Comment) Care giver support system in place?: Yes (comment)   Criminal Activity/Legal Involvement Pertinent to Current Situation/Hospitalization: No - Comment as needed  Activities of Daily Living   ADL Screening (condition at time of admission) Independently performs ADLs?: Yes (appropriate  for developmental age) Is the patient deaf or have difficulty hearing?: No Does the patient have difficulty seeing, even when wearing glasses/contacts?: Yes Does the patient have difficulty concentrating, remembering, or making decisions?: No  Permission Sought/Granted Permission sought to share information with : Case Manager, Family Supports Permission granted to share information with : Yes, Verbal Permission Granted  Share Information with NAME: Michael Brady     Permission granted to share info w Relationship: wife  Permission granted to share info w Contact Information: [phone]  Emotional Assessment Appearance:: Appears stated age Attitude/Demeanor/Rapport: Engaged Affect (typically observed): Calm Orientation: : Oriented to Self, Oriented to Place, Oriented to  Time, Oriented to Situation Alcohol / Substance Use: Tobacco Use Psych Involvement: No (comment)  Admission diagnosis:  Acute CVA (cerebrovascular accident) [facility]) [I63.9] Patient Active Problem List   Diagnosis Date Noted   Acute CVA (cerebrovascular accident) (HCC) 10/02/2023   Essential hypertension 06/26/2023   COPD (chronic obstructive pulmonary disease) (HCC) 12/25/2022   History of small bowel obstruction 10/19/2021   PUD (peptic ulcer disease) 10/19/2021   Renal insufficiency 09/14/2021   History of renal pelvis cancer 08/26/2021   Severe Diverticulosis 08/26/2021   Smoker 05/31/2020   Depression, major, single episode, moderate (HCC) 11/21/2017   Hyperlipidemia 04/03/2017   PCP:  [name], DO Pharmacy:   [facility] [phone] - [place], [place] - [address] [place] [address] Phone: [phone]  Fax: [phone]     Social Drivers of Health (SDOH) Social History: SDOH Screenings   Food Insecurity: No Food Insecurity (10/02/2023)  Housing: Low Risk  (10/02/2023)  Transportation Needs: No Transportation Needs  (10/02/2023)  Utilities: Not At Risk (10/02/2023)  Depression (PHQ2-9): Low Risk  (06/26/2023)  Financial Resource Strain: Low Risk  (12/24/2022)  Physical Activity: Insufficiently Active (12/24/2022)  Social Connections: Socially Integrated (10/02/2023)  Stress: No Stress Concern Present (12/24/2022)  Tobacco Use: High Risk (10/02/2023)   SDOH Interventions:     Readmission Risk Interventions     No data to display

## 2023-10-03 NOTE — Discharge Instructions (Signed)
 Dear Penelope Coop,   Congratulations for your interest in quitting smoking!  Find a program that suits you best: when you want to quit, how you need support, where you live, and how you like to learn.    If you're ready to get started TODAY, consider scheduling a visit through [email or web address].  Appointments are available from 8am to 8pm, Monday to Friday.   Most health insurance plans will cover some level of tobacco cessation visits and medications.    Additional Resources: [employer] are also available to help you quit & provide the support you'll need. Many programs are available in both [place] and Spanish and have a long history of successfully helping people get off and stay off tobacco.    Quit Smoking Apps:  quitSTART at SeriousBroker.de QuitGuide?at ForgetParking.dk Online education and resources: Smokefree  at [employer].gov Free Telephone Coaching: QuitNow,  Call 1-800-QUIT-NOW (4346565276) or Text- Ready to 769-362-2619 *Quitline [place] has teamed up with Medicaid to offer a free 14 week program    Vaping- Want to Quit? Free 24/7 support. Call [facility]  [place], [place], [place], [place], [place]  [facility]

## 2023-10-03 NOTE — Progress Notes (Addendum)
 STROKE TEAM PROGRESS NOTE    INTERIM HISTORY/SUBJECTIVE  Patient presented with left eye trouble for couple days.  Today he states it is a little bit better feels he is still missing top portion of his vision  MRI brain with small acute left caudate infarct On CTA head and neck there is diffuse intracranial atherosclerosis and a right upper lobe lung mass likely bronchogenic carcinoma  CBC    Component Value Date/Time   WBC 8.8 10/03/2023 0514   RBC 4.40 10/03/2023 0514   HGB 12.9 (L) 10/03/2023 0514   HGB 15.5 12/26/2022 0808   HCT 39.6 10/03/2023 0514   HCT 48.1 12/26/2022 0808   PLT 302 10/03/2023 0514   PLT 318 12/26/2022 0808   MCV 90.0 10/03/2023 0514   MCV 94 12/26/2022 0808   MCH 29.3 10/03/2023 0514   MCHC 32.6 10/03/2023 0514   RDW 13.0 10/03/2023 0514   RDW 12.3 12/26/2022 0808   LYMPHSABS 1.6 10/02/2023 1015   LYMPHSABS 2.0 04/02/2017 0833   MONOABS 0.7 10/02/2023 1015   EOSABS 0.1 10/02/2023 1015   EOSABS 0.1 04/02/2017 0833   BASOSABS 0.0 10/02/2023 1015   BASOSABS 0.0 04/02/2017 0833    BMET    Component Value Date/Time   NA 135 10/03/2023 0514   NA 138 06/26/2023 0907   K 4.0 10/03/2023 0514   CL 104 10/03/2023 0514   CO2 21 (L) 10/03/2023 0514   GLUCOSE 98 10/03/2023 0514   BUN 14 10/03/2023 0514   BUN 19 06/26/2023 0907   CREATININE 1.35 (H) 10/03/2023 0514   CALCIUM 9.0 10/03/2023 0514   EGFR 59 (L) 06/26/2023 0907   GFRNONAA 58 (L) 10/03/2023 0514    IMAGING past 24 hours CT CHEST ABDOMEN PELVIS W CONTRAST Result Date: 10/02/2023 CLINICAL DATA:  Lung nodule EXAM: CT CHEST, ABDOMEN, AND PELVIS WITH CONTRAST TECHNIQUE: Multidetector CT imaging of the chest, abdomen and pelvis was performed following the standard protocol during bolus administration of intravenous contrast. RADIATION DOSE REDUCTION: This exam was performed according to the departmental dose-optimization program which includes automated exposure control, adjustment of the mA  and/or kV according to patient size and/or use of iterative reconstruction technique. CONTRAST:  75mL OMNIPAQUE IOHEXOL 350 MG/ML SOLN COMPARISON:  CT angiogram head and neck same day. CT abdomen and pelvis 09/06/2021. FINDINGS: CT CHEST FINDINGS Cardiovascular: No significant vascular findings. Normal heart size. No pericardial effusion. There are atherosclerotic calcifications of the aorta and coronary arteries. Mediastinum/Nodes: No enlarged mediastinal, hilar, or axillary lymph nodes. Thyroid gland, trachea, and esophagus demonstrate no significant findings. Lungs/Pleura: Severe emphysematous changes are present. There is a spiculated nodule in the right upper lobe measuring 2.0 x 1.0 by 1.1 cm with a small amount adjacent posterior right upper lobe airspace disease. There is right lower lobe peribronchial thickening diffusely. There is a cluster of tree-in-bud opacities nodular densities measuring up to 6 mm in the right lower lobe. There is no pleural effusion or pneumothorax. Musculoskeletal: There is trace compression deformity of the superior endplate of T10 and T5 which is favored as chronic. No acute fractures are seen. No focal osseous lesions are identified. CT ABDOMEN PELVIS FINDINGS Hepatobiliary: There are 2 subcentimeter hypodensities in the liver which are too small to characterize. Gallbladder and bile ducts are within normal limits. Pancreas: Unremarkable. No pancreatic ductal dilatation or surrounding inflammatory changes. Spleen: Normal in size without focal abnormality. Adrenals/Urinary Tract: Subcentimeter cortical hypodensities in the right kidney are favored as cysts. There is no hydronephrosis or  perinephric fluid. The right adrenal gland appears within normal limits. There are too small filling defects along the superior bladder measuring up to 1 cm. Left nephrectomy changes are present. Left adrenal gland is within normal limits. Stomach/Bowel: There is sigmoid colon diverticulosis.  There is diffuse wall thickening of the sigmoid colon without surrounding inflammation. There is also diverticulosis of the descending colon. Scattered air-fluid levels are seen throughout the colon. The appendix is within normal limits. Small bowel and stomach are within normal limits. Vascular/Lymphatic: There is a 3.2 cm infrarenal abdominal aortic aneurysm. IVC is normal in size. There are atherosclerotic calcifications of the aorta. No enlarged lymph nodes are seen. Reproductive: Prostate is unremarkable. Other: There are 2 small to moderate-sized fat containing hernias above the level of the umbilicus, midline. These are new from prior. No ascites. Musculoskeletal: No acute or significant osseous findings. IMPRESSION: 1. Spiculated nodule in the right upper lobe measuring up to 2.0 cm worrisome for malignancy. 2. Right lower lobe peribronchial thickening with cluster of tree-in-bud opacities in the right lower lobe with associated nodular densities measuring up to 6 mm. Findings are favored as infectious/inflammatory. Metastatic nodules in the right lower lobe are not excluded given above findings. 3. Severe emphysema. 4. Two small filling defects along the superior bladder measuring up to 1 cm may represent bladder wall masses. Recommend further evaluation with cystoscopy. 5. Diffuse wall thickening of the sigmoid colon without surrounding inflammation. Findings may be related to colitis or diverticulitis. 6. Scattered air-fluid levels throughout the colon compatible with diarrheal illness. 7. 3.2 cm infrarenal abdominal aortic aneurysm. Recommend follow-up ultrasound every 3 years. 8. Two new small to moderate-sized fat containing hernias above the level of the umbilicus. Aortic Atherosclerosis (ICD10-I70.0) and Emphysema (ICD10-J43.9). Electronically Signed   By: Darliss Cheney M.D.   On: 10/02/2023 19:20    Vitals:   10/03/23 0941 10/03/23 1000 10/03/23 1100 10/03/23 1200  BP:  128/81 (!) 157/92 (!)  137/100  Pulse:  79 83 92  Resp:  20 13 (!) 28  Temp: 98.3 F (36.8 C)     TempSrc: Oral     SpO2:  96% 98% 94%  Weight:      Height:         PHYSICAL EXAM General:  Alert, well-nourished, well-developed middle-age Caucasian male in no acute distress Psych:  Mood and affect appropriate for situation CV: Regular rate and rhythm on monitor Respiratory:  Regular, unlabored respirations on room air GI: Abdomen soft and nontender   NEURO:  Mental Status: AA&Ox3, patient is able to give clear and coherent history Speech/Language: speech is without dysarthria or aphasia.  Naming, repetition, fluency, and comprehension intact.  Cranial Nerves:  II: PERRL. Visual fields full.  III, IV, VI: EOMI. Eyelids elevate symmetrically.  V: Sensation is intact to light touch and symmetrical to face.  VII: Face is symmetrical resting and smiling VIII: hearing intact to voice. IX, X: Palate elevates symmetrically. Phonation is normal.  ZO:[id number] shrug 5/5. XII: tongue is midline without fasciculations. Motor: 5/5 strength to all muscle groups tested.  Tone: is normal and bulk is normal Sensation- Intact to light touch bilaterally. Extinction absent to light touch to DSS.   Coordination: FTN intact bilaterally, HKS: no ataxia in BLE.No drift.  Gait- deferred  Most Recent NIH 0   ASSESSMENT/PLAN  Mr. RITHIK ODEA is a 68 y.o. male with history of left renal pelvis cancer, current smoker, hyperlipidemia, hypertension, COPD, depression admitted for vision loss.  NIH  on Admission 0  Acute Ischemic Infarct:  left caudate likely clinically asymptomatic. Etiology: Small vessel disease versus hypercoagulable due to new lung cancer CTA head & neck no LVO. positive for widespread atherosclerosis, and positive for widespread intracranial  arterial dolichoectasia. Right upper lobe Spiculated Lung Mass superimposed on Emphysema  MRI  small acute left caudate infarct 2D Echo ordered LDL 52 HgbA1c  5.5 VTE prophylaxis -SCDs No antithrombotic prior to admission, now on aspirin 81 mg daily alone as patient likely to need emergent lung biopsy so will not start Plavix.  Can add Plavix after lung biopsy Therapy recommendations:  Pending Disposition: Pending  Hypertension Home meds: Losartan 25 mg Stable Blood Pressure Goal: SBP less than 160   Hyperlipidemia Home meds: Crestor 20 mg,  resumed in hospital LDL 52, goal < 7 Continue statin at discharge  Diabetes type II Controlled Home meds: Jardiance HgbA1c 5.5, goal < 7.0 CBGs SSI Recommend close follow-up with PCP for better DM control  Right upper lobe lung mass Pulmonary consulted Biopsy probably next week  Tobacco Abuse Patient smokes 3 to 4 cigarettes a day       Ready to quit? Yes Nicotine replacement therapy provided  Dysphagia Patient has post-stroke dysphagia, SLP consulted    Diet   Diet Heart Room service appropriate? Yes; Fluid consistency: Thin   Advance diet as tolerated  Other Stroke Risk Factors ETOH use, alcohol level <10, advised to drink no more than 2 drink(s) a day   Other Active Problems Depression COPD  Hospital day # 0   Gevena Mart DNP, ACNPC-AG  [facility]  I have personally obtained history,examined this patient, reviewed notes, independently viewed imaging studies, participated in medical decision making and plan of care.ROS completed by me personally and pertinent positives fully documented  I have made any additions or clarifications directly to the above note. Agree with note above.  Patient presented with painless loss of vision in the left eye due to retinal artery branch occlusion which appears to be partially improving.  MRI shows a clinically silent small left caudate lacunar infarct likely from small vessel disease.  He has also been found to have right upper lobe lung mass which will need biopsy hence recommend start aspirin alone and can consider adding Plavix  after biopsy.  Continue ongoing stroke workup.  Aggressive risk factor modification.  Patient counseled to quit smoking.  From discussion patient and answered questions.  Discussed with Dr. Janee Morn.  Greater than 50% time during this 50-minute visit was spent in counseling and coordination of care about his incidental lacunar stroke and retinal artery branch occlusion and answering questions.  Delia Heady, MD Medical Director [facility] Pager: [phone] 10/03/2023 4:43 PM   To contact Stroke Continuity provider, please refer to [email or web address]. After hours, contact General Neurology

## 2023-10-03 NOTE — Progress Notes (Signed)
 Physical Therapy Note  Spoke with occupational therapy after their initial evaluation. OT reports patient is functioning at a high level of independence and no physical therapy is indicated at this time. Ind at baseline, owns [employer], lives with wife. PT is signing-off. Please re-order if there is any significant change in status. Thank you for this referral.  Kathlyn Sacramento, PT, DPT [facility]  Rehabilitation Services Physical Therapist Office: [phone] Website: [email or web address]

## 2023-10-03 NOTE — Care Management Obs Status (Signed)
 MEDICARE OBSERVATION STATUS NOTIFICATION   Patient Details  Name: Michael Brady MRN: [id number] Date of Birth: [birth year]   Medicare Observation Status Notification Given:  Yes    Alesia Richards, RN 10/03/2023, 4:29 PM

## 2023-10-03 NOTE — Progress Notes (Signed)
 PROGRESS NOTE    Michael Brady  UEA:[id number] DOB: 12-19-1955 DOA: 10/02/2023 PCP: [name], DO    Chief Complaint  Patient presents with   Loss of Vision    Brief Narrative: Patient 68 year old gentleman history of hypertension, hyperlipidemia, PUD, COPD, ongoing tobacco use, depression, history of renal pelvic cancer presenting with sudden onset visual loss.  Patient seen by his ophthalmologist on the day of admission due to concern for embolism patient sent to the ED for further evaluation.  Patient seen in the ED, MRI done concerning for acute CVA.  CT angiogram head and neck showed a spiculated right upper lobe lung mass concerning for carcinoma.  Emphysema also noted.  No large vessel occlusion noted.  Positive for widespread atherosclerosis.  Moderate stenosis of the vertebral arteries and right M1.  Neurology consulted recommended admission for stroke workup.   Assessment & Plan:   Principal Problem:   Acute CVA (cerebrovascular accident) (HCC) Active Problems:   Hyperlipidemia   Depression, major, single episode, moderate (HCC)   History of renal pelvis cancer   PUD (peptic ulcer disease)   COPD (chronic obstructive pulmonary disease) (HCC)   Essential hypertension   Mass of upper lobe of right lung   #1 acute CVA/retinal artery branch occlusion -Patient presented with sudden onset left eye vision loss that started on the day of admission, seen by ophthalmologist who noted evidence of embolism and patient sent to the ED for stroke workup. -MRI brain done consistent with acute CVA and neurology consulted. -CT angiogram head and neck showed no LVO.  Did show widespread atherosclerosis and moderate to severe stenosis in multiple vessels including severe changes at the right PCA.  Incidental finding of right upper lobe lung mass noted. -2D echo obtained and pending. -Permissive hypertension for the first 24 to 48 hours. -Fasting lipid panel with LDL of 52. -Hemoglobin  A1c 5.5. -Patient seen in consultation by neurology initially had recommended aspirin and Plavix and patient received a Plavix load on day of admission. -Patient being followed by neurology and due to need for emergent lung biopsy due to spiculated lung mass neurology recommending discontinuation of Plavix at this time and continuing with aspirin monotherapy and Plavix can be added after lung biopsy is done. -Continue Crestor. -PT/OT/SLP. -Neurology following appreciate input and recommendation.  2.  Right upper lobe spiculated mass -Noted on CT angiogram head and neck as well as CT chest abdomen and pelvis. -Patient noted on presentation on 10/02/2023 to have received a Plavix load. -Patient seen in consultation by pulmonary who discussed with patient on surgery versus biopsy and is noted that patient would prefer a biopsy first. -Pulmonary recommending navigational bronchoscopy in the coming days to week and would advise waiting 5 days after Plavix load from 10/02/2023. -Per neurology no plan for DAPT at this time and patient being treated with aspirin monotherapy for problem #1.  3.  Bladder wall masses -Noted on CT abdomen and pelvis. -Will need outpatient follow-up with urology for further evaluation and management.  4.  Hypertension -Permissive hypertension. -Continue to hold home regimen Cozaar.  5.  COPD -Stable. -Continue home regimen Trelegy.  6.  Tobacco abuse -Tobacco cessation stressed to patient. -Nicotine patch.  7.  Hyperlipidemia -LDL of 52. -Continue home regimen Crestor.  8.  Depression -Continue home regimen Celexa.  9.  Peptic ulcer disease -PPI.    DVT prophylaxis: Lovenox Code Status: Full Family Communication: Updated patient.  No family at bedside. Disposition: Likely home once  clinically improved and cleared by pulmonary and neurology, hopefully in the next 24 hours.  Status is: Observation The patient remains OBS appropriate and will d/c before  2 midnights.   Consultants:  Neurology: Dr. Amada Jupiter 10/02/2023 PCCM: Dr. Judeth Horn 10/03/2023  Procedures:  CT angiogram head and neck 10/02/2023 CT chest abdomen and pelvis 10/02/2023 MRI brain 10/02/2023 2D echo 10/03/2023 pending   Antimicrobials:  Anti-infectives (From admission, onward)    None         Subjective: Patient laying in bed.  Denies any chest pain.  Denies any shortness of breath.  No numbness or weakness.  States left vision has improved.  Still has some left visual loss in the left upper field.   Objective: Vitals:   10/03/23 0941 10/03/23 1000 10/03/23 1100 10/03/23 1200  BP:  128/81 (!) 157/92 (!) 137/100  Pulse:  79 83 92  Resp:  20 13 (!) 28  Temp: 98.3 F (36.8 C)     TempSrc: Oral     SpO2:  96% 98% 94%  Weight:      Height:       No intake or output data in the 24 hours ending 10/03/23 1717 Filed Weights   10/02/23 0948  Weight: 80.7 kg    Examination:  General exam: Appears calm and comfortable  Respiratory system: Clear to auscultation. Respiratory effort normal. Cardiovascular system: S1 & S2 heard, RRR. No JVD, murmurs, rubs, gallops or clicks. No pedal edema. Gastrointestinal system: Abdomen is nondistended, soft and nontender. No organomegaly or masses felt. Normal bowel sounds heard. Central nervous system: Alert and oriented.  Moving extremities spontaneously.  Left upper visual field cut.  Sensation intact.  Finger-to-nose intact. Extremities: Symmetric 5 x 5 power. Skin: No rashes, lesions or ulcers Psychiatry: Judgement and insight appear normal. Mood & affect appropriate.     Data Reviewed: I have personally reviewed following labs and imaging studies  CBC: Recent Labs  Lab 10/02/23 1015 10/03/23 0514  WBC 8.6 8.8  NEUTROABS 6.2  --   HGB 13.4 12.9*  HCT 42.3 39.6  MCV 92.4 90.0  PLT 309 302    Basic Metabolic Panel: Recent Labs  Lab 10/02/23 1015 10/03/23 0514  NA 138 135  K 5.0 4.0  CL 104 104  CO2  23 21*  GLUCOSE 90 98  BUN 16 14  CREATININE 1.41* 1.35*  CALCIUM 9.4 9.0    GFR: Estimated Creatinine Clearance: 53.8 mL/min (A) (by C-G formula based on SCr of 1.35 mg/dL (H)).  Liver Function Tests: Recent Labs  Lab 10/02/23 1015 10/03/23 0514  AST 25 20  ALT 14 15  ALKPHOS 67 66  BILITOT 0.7 0.8  PROT 7.5 6.9  ALBUMIN 3.9 3.5    CBG: No results for input(s): "GLUCAP" in the last 168 hours.   No results found for this or any previous visit (from the past 240 hours).       Radiology Studies: CT CHEST ABDOMEN PELVIS W CONTRAST Result Date: 10/02/2023 CLINICAL DATA:  Lung nodule EXAM: CT CHEST, ABDOMEN, AND PELVIS WITH CONTRAST TECHNIQUE: Multidetector CT imaging of the chest, abdomen and pelvis was performed following the standard protocol during bolus administration of intravenous contrast. RADIATION DOSE REDUCTION: This exam was performed according to the departmental dose-optimization program which includes automated exposure control, adjustment of the mA and/or kV according to patient size and/or use of iterative reconstruction technique. CONTRAST:  75mL OMNIPAQUE IOHEXOL 350 MG/ML SOLN COMPARISON:  CT angiogram head and neck same day.  CT abdomen and pelvis 09/06/2021. FINDINGS: CT CHEST FINDINGS Cardiovascular: No significant vascular findings. Normal heart size. No pericardial effusion. There are atherosclerotic calcifications of the aorta and coronary arteries. Mediastinum/Nodes: No enlarged mediastinal, hilar, or axillary lymph nodes. Thyroid gland, trachea, and esophagus demonstrate no significant findings. Lungs/Pleura: Severe emphysematous changes are present. There is a spiculated nodule in the right upper lobe measuring 2.0 x 1.0 by 1.1 cm with a small amount adjacent posterior right upper lobe airspace disease. There is right lower lobe peribronchial thickening diffusely. There is a cluster of tree-in-bud opacities nodular densities measuring up to 6 mm in the right  lower lobe. There is no pleural effusion or pneumothorax. Musculoskeletal: There is trace compression deformity of the superior endplate of T10 and T5 which is favored as chronic. No acute fractures are seen. No focal osseous lesions are identified. CT ABDOMEN PELVIS FINDINGS Hepatobiliary: There are 2 subcentimeter hypodensities in the liver which are too small to characterize. Gallbladder and bile ducts are within normal limits. Pancreas: Unremarkable. No pancreatic ductal dilatation or surrounding inflammatory changes. Spleen: Normal in size without focal abnormality. Adrenals/Urinary Tract: Subcentimeter cortical hypodensities in the right kidney are favored as cysts. There is no hydronephrosis or perinephric fluid. The right adrenal gland appears within normal limits. There are too small filling defects along the superior bladder measuring up to 1 cm. Left nephrectomy changes are present. Left adrenal gland is within normal limits. Stomach/Bowel: There is sigmoid colon diverticulosis. There is diffuse wall thickening of the sigmoid colon without surrounding inflammation. There is also diverticulosis of the descending colon. Scattered air-fluid levels are seen throughout the colon. The appendix is within normal limits. Small bowel and stomach are within normal limits. Vascular/Lymphatic: There is a 3.2 cm infrarenal abdominal aortic aneurysm. IVC is normal in size. There are atherosclerotic calcifications of the aorta. No enlarged lymph nodes are seen. Reproductive: Prostate is unremarkable. Other: There are 2 small to moderate-sized fat containing hernias above the level of the umbilicus, midline. These are new from prior. No ascites. Musculoskeletal: No acute or significant osseous findings. IMPRESSION: 1. Spiculated nodule in the right upper lobe measuring up to 2.0 cm worrisome for malignancy. 2. Right lower lobe peribronchial thickening with cluster of tree-in-bud opacities in the right lower lobe with  associated nodular densities measuring up to 6 mm. Findings are favored as infectious/inflammatory. Metastatic nodules in the right lower lobe are not excluded given above findings. 3. Severe emphysema. 4. Two small filling defects along the superior bladder measuring up to 1 cm may represent bladder wall masses. Recommend further evaluation with cystoscopy. 5. Diffuse wall thickening of the sigmoid colon without surrounding inflammation. Findings may be related to colitis or diverticulitis. 6. Scattered air-fluid levels throughout the colon compatible with diarrheal illness. 7. 3.2 cm infrarenal abdominal aortic aneurysm. Recommend follow-up ultrasound every 3 years. 8. Two new small to moderate-sized fat containing hernias above the level of the umbilicus. Aortic Atherosclerosis (ICD10-I70.0) and Emphysema (ICD10-J43.9). Electronically Signed   By: Darliss Cheney M.D.   On: 10/02/2023 19:20   CT ANGIO HEAD NECK W WO CM Result Date: 10/02/2023 CLINICAL DATA:  68 year old male with left eye vision loss. Small left caudate lacunar infarct on MRI this morning. EXAM: CT ANGIOGRAPHY HEAD AND NECK WITH AND WITHOUT CONTRAST TECHNIQUE: Multidetector CT imaging of the head and neck was performed using the standard protocol during bolus administration of intravenous contrast. Multiplanar CT image reconstructions and MIPs were obtained to evaluate the vascular anatomy. Carotid stenosis  measurements (when applicable) are obtained utilizing NASCET criteria, using the distal internal carotid diameter as the denominator. RADIATION DOSE REDUCTION: This exam was performed according to the departmental dose-optimization program which includes automated exposure control, adjustment of the mA and/or kV according to patient size and/or use of iterative reconstruction technique. CONTRAST:  75mL OMNIPAQUE IOHEXOL 350 MG/ML SOLN COMPARISON:  Brain MRI 1159 hours today. FINDINGS: CT HEAD Brain: Subtle posterior left caudate nucleus  diffusion abnormality on MRI is occult by CT. No midline shift, ventriculomegaly, mass effect, evidence of mass lesion, intracranial hemorrhage or evidence of cortically based acute infarction. Calvarium and skull base: Intact. Paranasal sinuses: Visualized paranasal sinuses and mastoids are clear. Orbits: Visualized orbits and scalp soft tissues are within normal limits. CTA NECK Skeleton: Absent dentition. Cervical spine degeneration. No acute osseous abnormality identified. Upper chest: Emphysema, moderate to severe. Spiculated right upper lobe lung mass, nearly 3 cm long axis on series 12, image 58 (roughly 17 by 29 by 18 mm AP by transverse by CC). Retained secretions in the lower trachea. No visible mediastinal lymphadenopathy, although conspicuous right paratracheal lymph nodes measuring 6 mm. Other neck: Asymmetry of the larynx suggesting right vocal cord paresis or paralysis. No neck mass or lymphadenopathy identified. Aortic arch: Soft and calcified arch atherosclerosis. Three vessel arch configuration. Right carotid system: Brachiocephalic artery soft and calcified plaque without stenosis. Proximal right CCA obscured by dense right subclavian venous contrast streak artifact. Otherwise negative right CCA. Soft and calcified plaque at the right ICA origin and bulb is mild and without stenosis. Tortuous right ICA below the skull base. Left carotid system: Left CCA origin, intermittent soft and calcified plaque which is up to moderate at the level of the subglottic larynx (series 11, image 134), up to 50% stenosis results. Soft and calcified plaque in the distal left CCA without stenosis. Moderate soft and calcified plaque at the left ICA origin and bulb with less than 50 % stenosis with respect to the distal vessel. Tortuosity below the skull base. Vertebral arteries: Right subclavian origin plaque without stenosis. Normal right vertebral artery origin. Non dominant right vertebral artery is patent to the  skull base with no significant plaque or stenosis. Proximal left subclavian artery soft more so than calcified plaque without stenosis. But calcified plaque at the left vertebral artery origin resulting in moderate stenosis. The left vertebral is dominant, mildly dolichoectatic. Additional soft and calcified plaque in the proximal V2 and the distal V3 segments, but no other hemodynamically significant left vertebral stenosis to the skull base. CTA HEAD Posterior circulation: Non dominant right vertebral artery terminates in PICA. Left vertebral supplies the basilar with up to moderate atherosclerotic stenosis at the vertebrobasilar junction series 15, image 34. Left PICA is patent. Tortuous basilar artery is patent without stenosis. Patent basilar tip. SCA and PCA origins are patent but there is moderate to severe stenosis of the right PCA origin on series 15, image 38. Up to moderate stenosis of the proximal left SCA. Posterior communicating arteries are diminutive or absent. Beaded appearance and moderate long segment stenosis of the left PCA P2 on series 16, image 32. Contralateral mild to moderate right P1 and P2 segment irregularity and stenosis. Maintained distal PCA enhancement. Anterior circulation: Both ICA siphons are patent. Calcified left siphon plaque without stenosis. Both siphons are ectatic. Calcified right siphon plaque without stenosis. Patent carotid termini, MCA and ACA origins. Mildly dominant right A1. Anterior communicating artery and bilateral ACA branches are within normal limits. Mild irregularity and  stenosis at the left MCA origin. Left MCA M1 and bifurcation are tad ache without additional stenosis. Left MCA branches are tortuous, without stenosis. Contralateral right MCA distal M1 irregularity and stenosis is moderate on series 15, image 42. Right MCA bifurcation is ectatic and patent without stenosis. Right MCA branches are tortuous, without stenosis. Venous sinuses: Early contrast  timing. Superior sagittal sinus is patent. Anatomic variants: Dominant left vertebral artery supplies the basilar, the right terminates in PICA. Dominant right ACA A1. Review of the MIP images confirms the above findings IMPRESSION: 1. Right upper lobe Spiculated Lung Mass superimposed on Emphysema (ICD10-J43.9) is nearly 3 cm and considered Bronchogenic Carcinoma until proven otherwise. PET-CT and referral to [facility]) would be most valuable. 2. CTA is Negative for large vessel occlusion, positive for widespread atherosclerosis, and positive for widespread intracranial arterial dolichoectasia. - Moderate stenosis at both the origin of the Dominant Left Vertebral Artery and at its Vertebrobasilar junction. Right vertebral terminates in PICA. - Moderate stenosis of the Right M1 segment. Mild left MCA origin stenosis. - Moderate to Severe stenosis of the Right PCA origin. And up to stenoses of bilateral P1 and P2 segments. 3.  No new intracranial abnormality since the MRI this morning. 4. Aortic Atherosclerosis (ICD10-I70.0). Electronically Signed   By: Odessa Fleming M.D.   On: 10/02/2023 15:38   MR BRAIN WO CONTRAST Result Date: 10/02/2023 CLINICAL DATA:  Neuro deficit, acute, stroke suspected. Blurry vision. EXAM: MRI HEAD WITHOUT CONTRAST TECHNIQUE: Multiplanar, multiecho pulse sequences of the brain and surrounding structures were obtained without intravenous contrast. COMPARISON:  None Available. FINDINGS: Brain: There is a 7 x 2 mm acute infarct involving the posterior left caudate body. There is no evidence of intracranial hemorrhage, mass, midline shift, or extra-axial fluid collection. Scattered small T2 hyperintensities in the cerebral white matter bilaterally are nonspecific but compatible with mildly age advanced chronic small vessel ischemic disease. Chronic lacunar infarcts are noted in the bilateral basal ganglia and left thalamus as well as right cerebellar  hemisphere. There is mild cerebral atrophy. Vascular: Major intracranial vascular flow voids are preserved. Skull and upper cervical spine: Unremarkable bone marrow signal para Sinuses/Orbits: Unremarkable orbits. Paranasal sinuses and mastoid air cells are clear. Other: None. IMPRESSION: 1. Small acute left caudate infarct. 2. Mildly age advanced chronic small vessel ischemic disease. Electronically Signed   By: Sebastian Ache M.D.   On: 10/02/2023 12:56        Scheduled Meds:  aspirin EC  81 mg Oral Daily   citalopram  20 mg Oral QHS   enoxaparin (LOVENOX) injection  40 mg Subcutaneous Q24H   fluticasone furoate-vilanterol  1 puff Inhalation Daily   And   umeclidinium bromide  1 puff Inhalation Daily   nicotine  21 mg Transdermal Daily   pantoprazole  40 mg Oral Q0600   rosuvastatin  20 mg Oral QHS   Continuous Infusions:   LOS: 0 days    Time spent: 40 minutes    Ramiro Harvest, MD [facility]   To contact the attending provider between 7A-7P or the covering provider during after hours 7P-7A, please log into the web site [email or web address] and access using [employer] password for that web site. If you do not have the password, please call the hospital operator.  10/03/2023, 5:17 PM

## 2023-10-03 NOTE — Consult Note (Signed)
 NAME:  Michael Brady, MRN:  [id number], DOB:  February 27, 1956, LOS: 0 ADMISSION DATE:  10/02/2023, CONSULTATION DATE: 10/03/2023 REFERRING MD: TRH, CHIEF COMPLAINT: Vision changes  History of Present Illness:  68 year old man history of tobacco abuse current smoker of emphysema on CT scan presented with vision changes after ophthalmic exam with incidental lung mass.  Denies significant cough.  No worsening shortness of breath or dyspnea on exertion.  Breathing feels fine.  He is a current smoker.  Smoking 3 to 4-day.  Smoked more in the past.  CT angiogram of the head and neck demonstrated significant emphysema and a 2 cm right upper lobe nodule.  CT abdomen pelvis chest with contrast thereafter confirmed to similar mass without mediastinal lymphadenopathy per report, I think there is a borderline 10R/11R just over 1 cm on my measurement but I see no significant lymphadenopathy at station 7 or station 4R, no significant lymphadenopathy on the left.  Discussed at length with patient this is most likely cancer based on appearance and size and location.  Advised him to quit smoking.  We discussed biopsy versus referral for to thoracic surgery for resection.  He would prefer a diagnosis prior to committing to possible surgical resection.  Pertinent  Medical History  Tobacco abuse, emphysema  Significant Hospital Events: Including procedures, antibiotic start and stop dates in addition to other pertinent events   3/26 admitted to the hospital after concern for retinal artery occlusion on ophthalmic exam, MRI shows additional small vessel infarct in the cerebrum, incidentally discovered 2 cm mass prompting consultation  Interim History / Subjective:    Objective   Blood pressure (!) 140/88, pulse 80, temperature 98.3 F (36.8 C), temperature source Oral, resp. rate 20, height 5\' 9"  (1.753 m), weight 80.7 kg, SpO2 96%.       No intake or output data in the 24 hours ending 10/03/23 1032 Filed Weights    10/02/23 0948  Weight: 80.7 kg    Examination: General: Well-appearing, sitting up in chair HENT: Atraumatic normocephalic Lungs: Normal work of breathing, on room air Cardiovascular: Regular rate and rhythm no edema Abdomen: Nondistended Neuro: Reports stable visual field deficits, otherwise no focal deficits on my exam  Resolved Hospital Problem list   N/a  Assessment & Plan:   Lung mass: 2 cm RUL spiculated nodule. Mayo risk 90.5% of malignancy. Discussed with patient referral straight to surgery vs biopsy. He would prefer biopsy first. No significant LAD on CT scan. --Will attempt to arrange for navigational bronchoscopy in coming days to week, would advise waiting 5 days after Plavix load 3/26 --No plan for DAPT after discussion with neurology, ok to proceed on ASA monotherapy  Tobacco abuse: Patient was strongly counseled that he should strictly abstain from all cigarette smoking moving forward  Best Practice (right click and "Reselect all SmartList Selections" daily)   Per primary  Labs   CBC: Recent Labs  Lab 10/02/23 1015 10/03/23 0514  WBC 8.6 8.8  NEUTROABS 6.2  --   HGB 13.4 12.9*  HCT 42.3 39.6  MCV 92.4 90.0  PLT 309 302    Basic Metabolic Panel: Recent Labs  Lab 10/02/23 1015 10/03/23 0514  NA 138 135  K 5.0 4.0  CL 104 104  CO2 23 21*  GLUCOSE 90 98  BUN 16 14  CREATININE 1.41* 1.35*  CALCIUM 9.4 9.0   GFR: Estimated Creatinine Clearance: 53.8 mL/min (A) (by C-G formula based on SCr of 1.35 mg/dL (H)). Recent Labs  Lab  10/02/23 1015 10/03/23 0514  WBC 8.6 8.8    Liver Function Tests: Recent Labs  Lab 10/02/23 1015 10/03/23 0514  AST 25 20  ALT 14 15  ALKPHOS 67 66  BILITOT 0.7 0.8  PROT 7.5 6.9  ALBUMIN 3.9 3.5   No results for input(s): "LIPASE", "AMYLASE" in the last 168 hours. No results for input(s): "AMMONIA" in the last 168 hours.  ABG No results found for: "PHART", "PCO2ART", "PO2ART", "HCO3", "TCO2",  "ACIDBASEDEF", "O2SAT"   Coagulation Profile: Recent Labs  Lab 10/02/23 1015  INR 1.0    Cardiac Enzymes: No results for input(s): "CKTOTAL", "CKMB", "CKMBINDEX", "TROPONINI" in the last 168 hours.  HbA1C: Hgb A1c MFr Bld  Date/Time Value Ref Range Status  10/03/2023 05:14 AM 5.5 4.8 - 5.6 % Final    Comment:    (NOTE) Pre diabetes:          5.7%-6.4%  Diabetes:              >6.4%  Glycemic control for   <7.0% adults with diabetes     CBG: No results for input(s): "GLUCAP" in the last 168 hours.  Review of Systems:   No chest pain no orthopnea or PND no increase SOB comprehensive review of systems otherwise negative   Past Medical History:  He,  has a past medical history of Cancer (HCC), Diverticulosis of colon, Dysuria, Hematuria, History of closed dislocation of shoulder (06/2014), PONV (postoperative nausea and vomiting), Renal mass, left, Smokers' cough (HCC), Wears dentures, and Wears glasses.   Surgical History:   Past Surgical History:  Procedure Laterality Date   CARPAL TUNNEL RELEASE Left 01-25-2006   dr Ophelia Charter  [facility]   and LEFT THUMB PULLEY RELEASE   COLONOSCOPY N/A 05/07/2017   Procedure: COLONOSCOPY;  Surgeon: Franky Macho, MD;  [facility];  Service: Gastroenterology;  Laterality: N/A;   CYSTOSCOPY W/ RETROGRADES Right 07/25/2018   Procedure: CYSTOSCOPY WITH RETROGRADE PYELOGRAM;  Surgeon: Sebastian Ache, MD;  Location[facility];  Service: Urology;  Laterality: Right;   CYSTOSCOPY WITH URETEROSCOPY AND STENT PLACEMENT Left 07/29/2017   Procedure: CYSTOSCOPY WITH URETEROSCOPY AND STENT PLACEMENT;  Surgeon: Hildred Laser, MD;  Location: [facility];  Service: Urology;  Laterality: Left;   CYSTOSCOPY/URETEROSCOPY/HOLMIUM LASER/STENT PLACEMENT Left 07/15/2017   Procedure: CYSTOSCOPY, ATTEMTED URETEROSCOPY/,STENT PLACEMENT;  Surgeon: Hildred Laser, MD;  Location: [facility];  Service:  Urology;  Laterality: Left;   CYSTOSCOPY/URETEROSCOPY/HOLMIUM LASER/STENT PLACEMENT Left 10/21/2017   Procedure: CYSTOSCOPY, RETROGRADE /URETEROSCOPY LEFT UPPER TRACT,BIOPSY, Manning Charity LASER/STENT PLACEMENT;  Surgeon: Hildred Laser, MD;  Location: [facility];  Service: Urology;  Laterality: Left;   GASTRORRHAPHY N/A 08/26/2021   Procedure: GASTRORRHAPHY;  Surgeon: Lewie Chamber, DO;  [facility];  Service: General;  Laterality: N/A;   HOLMIUM LASER APPLICATION Left 10/21/2017   Procedure: HOLMIUM LASER APPLICATION;  Surgeon: Hildred Laser, MD;  Location: [facility];  Service: Urology;  Laterality: Left;   INCISIONAL HERNIA REPAIR Right 08/26/2021   Procedure: HERNIA REPAIR INCISIONAL;  Surgeon: Lewie Chamber, DO;  Location: [facility];  Service: General;  Laterality: Right;   LAPAROTOMY N/A 08/26/2021   Procedure: EXPLORATORY LAPAROTOMY;  Surgeon: Lewie Chamber, DO;  Location: [facility];  Service: General;  Laterality: N/A;   ROBOT ASSITED LAPAROSCOPIC NEPHROURETERECTOMY Left 12/11/2017   Procedure: XI ROBOT ASSITED LAPAROSCOPIC NEPHROURETERECTOMY;  Surgeon: Sebastian Ache, MD;  Location: [facility];  Service: Urology;  Laterality:  Left;   SHOULDER SURGERY Right 1997   ROTATOR CUFF REPAIR    THULIUM LASER TURP (TRANSURETHRAL RESECTION OF PROSTATE) Left 07/29/2017   Procedure: CYSTOSCOPY, LEFT URETEROSCOPY WITH TUMOR BIOPSYTHULLIUM LASER ABLATION OF TUMOR AND LEFT URETERAL STENT EXCHANGE;  Surgeon: Hildred Laser, MD;  Location: [facility];  Service: Urology;  Laterality: Left;   TRANSURETHRAL RESECTION OF BLADDER TUMOR N/A 07/25/2018   Procedure: TRANSURETHRAL RESECTION OF BLADDER TUMOR (TURBT);  Surgeon: Sebastian Ache, MD;  Location: [facility];  Service: Urology;  Laterality: N/A;   WISDOM TOOTH EXTRACTION       Social History:   reports that he has been smoking cigarettes. He has a 60 pack-year  smoking history. He has never used smokeless tobacco. He reports that he does not drink alcohol and does not use drugs.   Family History:  His family history is negative for Colon cancer.   Allergies No Known Allergies   Home Medications  Prior to Admission medications   Medication Sig Start Date End Date Taking? Authorizing Provider  acetaminophen (TYLENOL) 500 MG tablet Take 2 tablets (1,000 mg total) by mouth every 6 (six) hours. 08/31/21  Yes Shah, Pratik D, DO  citalopram (CELEXA) 20 MG tablet TAKE 1 TABLET(20 MG) BY MOUTH DAILY Patient taking differently: Take 20 mg by mouth at bedtime. 02/25/23  Yes Cook, Jayce G, DO  empagliflozin (JARDIANCE) 10 MG TABS tablet Take 1 tablet (10 mg total) by mouth daily before breakfast. Patient taking differently: Take 10 mg by mouth at bedtime. 07/14/23  Yes Cook, Jayce G, DO  losartan (COZAAR) 25 MG tablet Take 1 tablet (25 mg total) by mouth daily. Patient taking differently: Take 25 mg by mouth at bedtime. 06/26/23  Yes Cook, Jayce G, DO  nicotine (NICODERM CQ - DOSED IN MG/24 HOURS) 21 mg/24hr patch APPLY 1 PATCH(21 MG) TOPICALLY TO THE SKIN DAILY Patient taking differently: Place 21 mg onto the skin daily. 04/19/23  Yes Cook, Jayce G, DO  rosuvastatin (CRESTOR) 20 MG tablet Take 1 tablet (20 mg total) by mouth daily. Patient taking differently: Take 20 mg by mouth at bedtime. 01/03/23  Yes Cook, Jayce G, DO  TRELEGY ELLIPTA 100-62.5-25 MCG/ACT AEPB INHALE 1 PUFF INTO THE LUNGS DAILY Patient taking differently: Inhale 1 puff into the lungs in the morning. 07/22/23  Yes [name], DO     Critical care time: n/a     Karren Burly, MD See Loretha Stapler

## 2023-10-03 NOTE — Progress Notes (Signed)
 OT Cancellation Note  Patient Details Name: DENZAL MEIR MRN: [id number] DOB: 1955-10-15   Cancelled Treatment:    Reason Eval/Treat Not Completed: (P) OT screened, no needs identified, will sign off. Pt at PLOF of independent with ADLs/mobility, feels back to 100 percent, able to ambulate 100 feet no problem. Vision in top left visual field slightly blurry still but does not affect functional independence, signing off.  Alexis Goodell 10/03/2023, 10:52 AM

## 2023-10-04 ENCOUNTER — Other Ambulatory Visit (HOSPITAL_COMMUNITY): Payer: Self-pay

## 2023-10-04 DIAGNOSIS — R918 Other nonspecific abnormal finding of lung field: Secondary | ICD-10-CM | POA: Diagnosis not present

## 2023-10-04 DIAGNOSIS — I639 Cerebral infarction, unspecified: Secondary | ICD-10-CM | POA: Diagnosis not present

## 2023-10-04 LAB — ECHOCARDIOGRAM COMPLETE
Area-P 1/2: 2.6 cm2
Height: 69 in
S' Lateral: 3.1 cm
Weight: 2848 [oz_av]

## 2023-10-04 LAB — BASIC METABOLIC PANEL WITH GFR
Anion gap: 8 (ref 5–15)
BUN: 14 mg/dL (ref 8–23)
CO2: 22 mmol/L (ref 22–32)
Calcium: 8.5 mg/dL — ABNORMAL LOW (ref 8.9–10.3)
Chloride: 107 mmol/L (ref 98–111)
Creatinine, Ser: 1.34 mg/dL — ABNORMAL HIGH (ref 0.61–1.24)
GFR, Estimated: 58 mL/min — ABNORMAL LOW (ref >60)
Glucose, Bld: 82 mg/dL (ref 70–99)
Potassium: 3.9 mmol/L (ref 3.5–5.1)
Sodium: 137 mmol/L (ref 135–145)

## 2023-10-04 LAB — CBC
HCT: 39 % (ref 39.0–52.0)
Hemoglobin: 12.9 g/dL — ABNORMAL LOW (ref 13.0–17.0)
MCH: 29.5 pg (ref 26.0–34.0)
MCHC: 33.1 g/dL (ref 30.0–36.0)
MCV: 89.2 fL (ref 80.0–100.0)
Platelets: 304 10*3/uL (ref 150–400)
RBC: 4.37 MIL/uL (ref 4.22–5.81)
RDW: 13.1 % (ref 11.5–15.5)
WBC: 7.4 10*3/uL (ref 4.0–10.5)
nRBC: 0 % (ref 0.0–0.2)

## 2023-10-04 MED ORDER — ASPIRIN 81 MG PO TBEC
81.0000 mg | DELAYED_RELEASE_TABLET | Freq: Every day | ORAL | 12 refills | Status: AC
  Filled 2023-10-04: qty 30, 30d supply, fill #0

## 2023-10-04 MED ORDER — LOSARTAN POTASSIUM 25 MG PO TABS: 25.0000 mg | ORAL_TABLET | Freq: Every day | ORAL | Status: DC

## 2023-10-04 NOTE — Plan of Care (Signed)
   Problem: Education: Goal: Knowledge of disease or condition will improve Outcome: Completed/Met Goal: Knowledge of secondary prevention will improve (MUST DOCUMENT ALL) Outcome: Completed/Met Goal: Knowledge of patient specific risk factors will improve (DELETE if not current risk factor) Outcome: Completed/Met   Problem: Ischemic Stroke/TIA Tissue Perfusion: Goal: Complications of ischemic stroke/TIA will be minimized Outcome: Completed/Met   Problem: Coping: Goal: Will verbalize positive feelings about self Outcome: Completed/Met Goal: Will identify appropriate support needs Outcome: Completed/Met   Problem: Health Behavior/Discharge Planning: Goal: Ability to manage health-related needs will improve Outcome: Completed/Met Goal: Goals will be collaboratively established with patient/family Outcome: Completed/Met   Problem: Self-Care: Goal: Ability to participate in self-care as condition permits will improve Outcome: Completed/Met Goal: Verbalization of feelings and concerns over difficulty with self-care will improve Outcome: Completed/Met Goal: Ability to communicate needs accurately will improve Outcome: Completed/Met   Problem: Nutrition: Goal: Risk of aspiration will decrease Outcome: Completed/Met Goal: Dietary intake will improve Outcome: Completed/Met   Problem: Education: Goal: Knowledge of General Education information will improve Description: Including pain rating scale, medication(s)/side effects and non-pharmacologic comfort measures Outcome: Completed/Met   Problem: Health Behavior/Discharge Planning: Goal: Ability to manage health-related needs will improve Outcome: Completed/Met   Problem: Clinical Measurements: Goal: Ability to maintain clinical measurements within normal limits will improve Outcome: Completed/Met Goal: Will remain free from infection Outcome: Completed/Met Goal: Diagnostic test results will improve Outcome:  Completed/Met Goal: Respiratory complications will improve Outcome: Completed/Met Goal: Cardiovascular complication will be avoided Outcome: Completed/Met   Problem: Activity: Goal: Risk for activity intolerance will decrease Outcome: Completed/Met   Problem: Nutrition: Goal: Adequate nutrition will be maintained Outcome: Completed/Met   Problem: Coping: Goal: Level of anxiety will decrease Outcome: Completed/Met   Problem: Elimination: Goal: Will not experience complications related to bowel motility Outcome: Completed/Met Goal: Will not experience complications related to urinary retention Outcome: Completed/Met   Problem: Pain Managment: Goal: General experience of comfort will improve and/or be controlled Outcome: Completed/Met   Problem: Safety: Goal: Ability to remain free from injury will improve Outcome: Completed/Met   Problem: Skin Integrity: Goal: Risk for impaired skin integrity will decrease Outcome: Completed/Met

## 2023-10-04 NOTE — TOC Transition Note (Signed)
 Transition of Care [facility]) - Discharge Note   Patient Details  Name: Michael Brady MRN: [id number] Date of Birth: August 12, 1955  Transition of Care [facility]) CM/SW Contact:  Alesia Richards, RN 10/04/2023, 11:02 AM   Clinical Narrative:      Discharge orders noted. Patient to discharge to home via private transportation arranged. CM call to patient, regarding pending discharge. No voiced concerns.  Final next level of care: Home/Self Care Barriers to Discharge: Continued Medical Work up   Patient Goals and CMS Choice    Home/self care     Discharge Placement         Home/self care        Discharge Plan and Services Additional resources added to the After Visit Summary for     Discharge Planning Services: CM Consult              Social Drivers of Health (SDOH) Interventions SDOH Screenings   Food Insecurity: No Food Insecurity (10/02/2023)  Housing: Low Risk  (10/02/2023)  Transportation Needs: No Transportation Needs (10/02/2023)  Utilities: Not At Risk (10/02/2023)  Depression (PHQ2-9): Low Risk  (06/26/2023)  Financial Resource Strain: Low Risk  (12/24/2022)  Physical Activity: Insufficiently Active (12/24/2022)  Social Connections: Socially Integrated (10/02/2023)  Stress: No Stress Concern Present (12/24/2022)  Tobacco Use: High Risk (10/02/2023)     Readmission Risk Interventions     No data to display

## 2023-10-04 NOTE — Evaluation (Signed)
 Speech Language Pathology Evaluation Patient Details Name: Michael Brady MRN: [id number] DOB: 1956-01-22 Today's Date: 10/04/2023 Time: 1308-6578 SLP Time Calculation (min) (ACUTE ONLY): 15 min  Problem List:  Patient Active Problem List   Diagnosis Date Noted   Mass of upper lobe of right lung 10/03/2023   Acute CVA (cerebrovascular accident) (HCC) 10/02/2023   Essential hypertension 06/26/2023   COPD (chronic obstructive pulmonary disease) (HCC) 12/25/2022   History of small bowel obstruction 10/19/2021   PUD (peptic ulcer disease) 10/19/2021   Renal insufficiency 09/14/2021   History of renal pelvis cancer 08/26/2021   Severe Diverticulosis 08/26/2021   Smoker 05/31/2020   Depression, major, single episode, moderate (HCC) 11/21/2017   Hyperlipidemia 04/03/2017   Past Medical History:  Past Medical History:  Diagnosis Date   Cancer (HCC)    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 I JUST HAD MY BLOOD DRAWN AT [facility] THIS LAST MONDAY BUT THEY HAVENT CALLED ME ABOUT IT YET"   History of closed dislocation of shoulder 06/2014   LEFT --- POST IV SEDATION CLOSED REDUCTION IN ED   PONV (postoperative nausea and vomiting)    Renal mass, left    LEFT RENAL COLLECTING SYSTEM MASS   Smokers' cough (HCC)    OCC   Wears dentures    Wears glasses    Past Surgical History:  Past Surgical History:  Procedure Laterality Date   CARPAL TUNNEL RELEASE Left 01-25-2006   dr Ophelia Charter  [facility]   and LEFT THUMB PULLEY RELEASE   COLONOSCOPY N/A 05/07/2017   Procedure: COLONOSCOPY;  Surgeon: Franky Macho, MD;  [facility];  Service: Gastroenterology;  Laterality: N/A;   CYSTOSCOPY W/ RETROGRADES Right 07/25/2018   Procedure: CYSTOSCOPY WITH RETROGRADE PYELOGRAM;  Surgeon: Sebastian Ache, MD;   Location: [facility];  Service: Urology;  Laterality: Right;   CYSTOSCOPY WITH URETEROSCOPY AND STENT PLACEMENT Left 07/29/2017   Procedure: CYSTOSCOPY WITH URETEROSCOPY AND STENT PLACEMENT;  Surgeon: Hildred Laser, MD;  Location: [facility];  Service: Urology;  Laterality: Left;   CYSTOSCOPY/URETEROSCOPY/HOLMIUM LASER/STENT PLACEMENT Left 07/15/2017   Procedure: CYSTOSCOPY, ATTEMTED URETEROSCOPY/,STENT PLACEMENT;  Surgeon: Hildred Laser, MD;  Location: [facility];  Service: Urology;  Laterality: Left;   CYSTOSCOPY/URETEROSCOPY/HOLMIUM LASER/STENT PLACEMENT Left 10/21/2017   Procedure: CYSTOSCOPY, RETROGRADE /URETEROSCOPY LEFT UPPER TRACT,BIOPSY, Manning Charity LASER/STENT PLACEMENT;  Surgeon: Hildred Laser, MD;  Location: [facility];  Service: Urology;  Laterality: Left;   GASTRORRHAPHY N/A 08/26/2021   Procedure: GASTRORRHAPHY;  Surgeon: Lewie Chamber, DO;  [facility];  Service: General;  Laterality: N/A;   HOLMIUM LASER APPLICATION Left 10/21/2017   Procedure: HOLMIUM LASER APPLICATION;  Surgeon: Hildred Laser, MD;  Location: [facility];  Service: Urology;  Laterality: Left;   INCISIONAL HERNIA REPAIR Right 08/26/2021   Procedure: HERNIA REPAIR INCISIONAL;  Surgeon: Lewie Chamber, DO;  Location[facility];  Service: General;  Laterality: Right;   LAPAROTOMY N/A 08/26/2021   Procedure: EXPLORATORY LAPAROTOMY;  Surgeon: Lewie Chamber, DO;  Location[facility];  Service: General;  Laterality: N/A;   ROBOT ASSITED LAPAROSCOPIC NEPHROURETERECTOMY Left 12/11/2017   Procedure: XI ROBOT ASSITED LAPAROSCOPIC NEPHROURETERECTOMY;  Surgeon: Sebastian Ache, MD;  Location: [facility];  Service: Urology;  Laterality: Left;   SHOULDER SURGERY Right 1997   ROTATOR CUFF REPAIR    THULIUM LASER TURP (TRANSURETHRAL RESECTION OF PROSTATE) Left 07/29/2017   Procedure: CYSTOSCOPY, LEFT URETEROSCOPY WITH  TUMOR BIOPSYTHULLIUM LASER ABLATION OF TUMOR AND LEFT URETERAL STENT EXCHANGE;  Surgeon: Hildred Laser, MD;  Location: [facility];  Service: Urology;  Laterality: Left;   TRANSURETHRAL RESECTION OF BLADDER TUMOR N/A 07/25/2018   Procedure: TRANSURETHRAL RESECTION OF BLADDER TUMOR (TURBT);  Surgeon: Sebastian Ache, MD;  Location: [facility];  Service: Urology;  Laterality: N/A;   WISDOM TOOTH EXTRACTION     HPI:  Michael Brady is a 68 y.o. male who presented with vision loss.MRI 3/26: "Small acute left caudate infarct." Pt with medical history significant of hypertension, hyperlipidemia, PUD, COPD, depression, history of renal pelvis cancer   Assessment / Plan / Recommendation Clinical Impression  Pt presents with functional cognitive-linguistic abilities. He was assessed using the COGNISTAT (see below for addtional information).  He scored within the average range on all subtests administered with score of 9 on word recall taks not reaching the level of mild impairment.  Pt denies any concerns regarding changes to recall.  Visuospatial ability was assessed using a clock drawing which was completed with 100% accuracy in a timely and organized fashion.  Pt has no further ST needs.  SLP will sign off.  COGNISTAT: All subtests are within the average range, except where otherwise specified.  Orientation:  12/12 Attention: 8/8 Comprehension: 5/6 Repetition: 12/12 Naming: 8/8 Construction: clock drawing 100% Memory: 9/12, Borderline impairment Calculations: 4/4 Similarities: 7/8 Judgment: 6/6     SLP Assessment  SLP Recommendation/Assessment: Patient does not need any further Speech Lanaguage Pathology Services SLP Visit Diagnosis: Cognitive communication deficit (R41.841)    Recommendations for follow up therapy are one component of a multi-disciplinary discharge planning process, led by the attending physician.  Recommendations may be updated based on  patient status, additional functional criteria and insurance authorization.    Follow Up Recommendations  No SLP follow up    Assistance Recommended at Discharge  None  Functional Status Assessment Patient has not had a recent decline in their functional status  Frequency and Duration     N/A      SLP Evaluation Cognition  Overall Cognitive Status: Within Functional Limits for tasks assessed Arousal/Alertness: Awake/alert Orientation Level: Oriented X4 Year: 2025 Month: March Day of Week: Correct Attention: Focused;Sustained Focused Attention: Appears intact Sustained Attention: Appears intact Memory: Appears intact Awareness: Appears intact Problem Solving: Appears intact Executive Function: Reasoning;Organizing Reasoning: Appears intact Organizing: Appears intact       Comprehension  Auditory Comprehension Overall Auditory Comprehension: Appears within functional limits for tasks assessed Commands: Within Functional Limits Conversation: Complex Visual Recognition/Discrimination Discrimination: Not tested Reading Comprehension Reading Status: Not tested    Expression Expression Primary Mode of Expression: Verbal Verbal Expression Overall Verbal Expression: Appears within functional limits for tasks assessed Initiation: No impairment Level of Generative/Spontaneous Verbalization: Conversation Repetition: No impairment Naming: No impairment Pragmatics: No impairment Written Expression Dominant Hand: Right Written Expression: Not tested   Oral / Motor  Motor Speech Overall Motor Speech: Appears within functional limits for tasks assessed Respiration: Within functional limits Phonation: Normal Resonance: Within functional limits Articulation: Within functional limitis Intelligibility: Intelligible Motor Planning: Witnin functional limits Motor Speech Errors: Not applicable            Kerrie Pleasure, MA, CCC-SLP Acute Rehabilitation Services Office:  [phone] 10/04/2023, 10:52 AM

## 2023-10-04 NOTE — H&P (View-Only) (Signed)
 NAME:  Michael Brady, MRN:  [id number], DOB:  [birth year], LOS: 0 ADMISSION DATE:  10/02/2023, CONSULTATION DATE: 10/03/2023 REFERRING MD: TRH, CHIEF COMPLAINT: Vision changes  History of Present Illness:  68 year old man history of tobacco abuse current smoker of emphysema on CT scan presented with vision changes after ophthalmic exam with incidental lung mass.  Denies significant cough.  No worsening shortness of breath or dyspnea on exertion.  Breathing feels fine.  He is a current smoker.  Smoking 3 to 4-day.  Smoked more in the past.  CT angiogram of the head and neck demonstrated significant emphysema and a 2 cm right upper lobe nodule.  CT abdomen pelvis chest with contrast thereafter confirmed to similar mass without mediastinal lymphadenopathy per report, I think there is a borderline 10R/11R just over 1 cm on my measurement but I see no significant lymphadenopathy at station 7 or station 4R, no significant lymphadenopathy on the left.  Discussed at length with patient this is most likely cancer based on appearance and size and location.  Advised him to quit smoking.  We discussed biopsy versus referral for to thoracic surgery for resection.  He would prefer a diagnosis prior to committing to possible surgical resection.  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 Doing well.   Objective   Blood pressure 111/61, pulse (!) 58, temperature 98.5 F (36.9 C), temperature source Oral, resp. rate 17, height 5\' 9"  (1.753 m), weight 80.7 kg, SpO2 93%.       No intake or output data in the 24 hours ending 10/04/23  1126 Filed Weights   10/02/23 0948  Weight: 80.7 kg    Examination: General: Well-appearing, sitting up in bed HENT: Atraumatic normocephalic Lungs: Normal work of breathing, on room air Cardiovascular: Regular rate and rhythm no edema Abdomen: Nondistended Neuro: Reports stable visual field deficits, otherwise no focal deficits on my exam  Resolved Hospital Problem list   N/a  Assessment & Plan:   Lung mass: 2 cm RUL spiculated nodule. Mayo risk 90.5% of malignancy. Discussed with patient referral straight to surgery vs biopsy. He would prefer biopsy first. No significant LAD on CT scan. --Nav bronch with biopsy 4/1, pt instructed on NPO at [facility], arrive at 12 noon, needs ride home due to anaesthesia administration  --No plan for DAPT after discussion with neurology, ok to proceed on ASA monotherapy  Tobacco abuse: Patient was strongly counseled that he should strictly abstain from all cigarette smoking moving forward  Best Practice (right click and "Reselect all SmartList Selections" daily)   Per primary  Labs   CBC: Recent Labs  Lab 10/02/23 1015 10/03/23 0514 10/04/23 0523  WBC 8.6 8.8 7.4  NEUTROABS 6.2  --   --   HGB 13.4 12.9* 12.9*  HCT 42.3 39.6 39.0  MCV 92.4 90.0 89.2  PLT 309 302 304    Basic Metabolic Panel: Recent Labs  Lab 10/02/23 1015 10/03/23 0514 10/04/23 0523  NA 138 135 137  K 5.0 4.0 3.9  CL 104 104 107  CO2 23 21* 22  GLUCOSE 90 98 82  BUN 16 14 14   CREATININE 1.41* 1.35* 1.34*  CALCIUM 9.4  9.0 8.5*   GFR: Estimated Creatinine Clearance: 54.2 mL/min (A) (by C-G formula based on SCr of 1.34 mg/dL (H)). Recent Labs  Lab 10/02/23 1015 10/03/23 0514 10/04/23 0523  WBC 8.6 8.8 7.4    Liver Function Tests: Recent Labs  Lab 10/02/23 1015 10/03/23 0514  AST 25 20  ALT 14 15  ALKPHOS 67 66  BILITOT 0.7 0.8  PROT 7.5 6.9  ALBUMIN 3.9 3.5   No results for input(s): "LIPASE", "AMYLASE" in the last 168 hours. No results for  input(s): "AMMONIA" in the last 168 hours.  ABG No results found for: "PHART", "PCO2ART", "PO2ART", "HCO3", "TCO2", "ACIDBASEDEF", "O2SAT"   Coagulation Profile: Recent Labs  Lab 10/02/23 1015  INR 1.0    Cardiac Enzymes: No results for input(s): "CKTOTAL", "CKMB", "CKMBINDEX", "TROPONINI" in the last 168 hours.  HbA1C: Hgb A1c MFr Bld  Date/Time Value Ref Range Status  10/03/2023 05:14 AM 5.5 4.8 - 5.6 % Final    Comment:    (NOTE) Pre diabetes:          5.7%-6.4%  Diabetes:              >6.4%  Glycemic control for   <7.0% adults with diabetes     CBG: No results for input(s): "GLUCAP" in the last 168 hours.  Review of Systems:   No chest pain no orthopnea or PND no increase SOB comprehensive review of systems otherwise negative   Past Medical History:  He,  has a past medical history of Cancer (HCC), Diverticulosis of colon, Dysuria, Hematuria, History of closed dislocation of shoulder (06/2014), PONV (postoperative nausea and vomiting), Renal mass, left, Smokers' cough (HCC), Wears dentures, and Wears glasses.   Surgical History:   Past Surgical History:  Procedure Laterality Date   CARPAL TUNNEL RELEASE Left 01-25-2006   dr Ophelia Charter  [facility]   and LEFT THUMB PULLEY RELEASE   COLONOSCOPY N/A 05/07/2017   Procedure: COLONOSCOPY;  Surgeon: Franky Macho, MD;  [facility];  Service: Gastroenterology;  Laterality: N/A;   CYSTOSCOPY W/ RETROGRADES Right 07/25/2018   Procedure: CYSTOSCOPY WITH RETROGRADE PYELOGRAM;  Surgeon: Sebastian Ache, MD;  Location: [facility];  Service: Urology;  Laterality: Right;   CYSTOSCOPY WITH URETEROSCOPY AND STENT PLACEMENT Left 07/29/2017   Procedure: CYSTOSCOPY WITH URETEROSCOPY AND STENT PLACEMENT;  Surgeon: Hildred Laser, MD;  Location: [facility];  Service: Urology;  Laterality: Left;   CYSTOSCOPY/URETEROSCOPY/HOLMIUM LASER/STENT PLACEMENT Left 07/15/2017   Procedure: CYSTOSCOPY, ATTEMTED  URETEROSCOPY/,STENT PLACEMENT;  Surgeon: Hildred Laser, MD;  Location: [facility];  Service: Urology;  Laterality: Left;   CYSTOSCOPY/URETEROSCOPY/HOLMIUM LASER/STENT PLACEMENT Left 10/21/2017   Procedure: CYSTOSCOPY, RETROGRADE /URETEROSCOPY LEFT UPPER TRACT,BIOPSY, Manning Charity LASER/STENT PLACEMENT;  Surgeon: Hildred Laser, MD;  Location: [facility];  Service: Urology;  Laterality: Left;   GASTRORRHAPHY N/A 08/26/2021   Procedure: GASTRORRHAPHY;  Surgeon: Lewie Chamber, DO;  Location: [facility];  Service: General;  Laterality: N/A;   HOLMIUM LASER APPLICATION Left 10/21/2017   Procedure: HOLMIUM LASER APPLICATION;  Surgeon: Hildred Laser, MD;  Location: [facility];  Service: Urology;  Laterality: Left;   INCISIONAL HERNIA REPAIR Right 08/26/2021   Procedure: HERNIA REPAIR INCISIONAL;  Surgeon: Lewie Chamber, DO;  Location: [facility];  Service: General;  Laterality: Right;   LAPAROTOMY N/A 08/26/2021   Procedure: EXPLORATORY LAPAROTOMY;  Surgeon: Lewie Chamber, DO;  Location: [facility];  Service: General;  Laterality: N/A;  ROBOT ASSITED LAPAROSCOPIC NEPHROURETERECTOMY Left 12/11/2017   Procedure: XI ROBOT ASSITED LAPAROSCOPIC NEPHROURETERECTOMY;  Surgeon: Sebastian Ache, MD;  Location: [facility];  Service: Urology;  Laterality: Left;   SHOULDER SURGERY Right 1997   ROTATOR CUFF REPAIR    THULIUM LASER TURP (TRANSURETHRAL RESECTION OF PROSTATE) Left 07/29/2017   Procedure: CYSTOSCOPY, LEFT URETEROSCOPY WITH TUMOR BIOPSYTHULLIUM LASER ABLATION OF TUMOR AND LEFT URETERAL STENT EXCHANGE;  Surgeon: Hildred Laser, MD;  Location: [facility];  Service: Urology;  Laterality: Left;   TRANSURETHRAL RESECTION OF BLADDER TUMOR N/A 07/25/2018   Procedure: TRANSURETHRAL RESECTION OF BLADDER TUMOR (TURBT);  Surgeon: Sebastian Ache, MD;  Location: [facility];  Service: Urology;  Laterality: N/A;    WISDOM TOOTH EXTRACTION       Social History:   reports that he has been smoking cigarettes. He has a 60 pack-year smoking history. He has never used smokeless tobacco. He reports that he does not drink alcohol and does not use drugs.   Family History:  His family history is negative for Colon cancer.   Allergies No Known Allergies   Home Medications  Prior to Admission medications   Medication Sig Start Date End Date Taking? Authorizing Provider  acetaminophen (TYLENOL) 500 MG tablet Take 2 tablets (1,000 mg total) by mouth every 6 (six) hours. 08/31/21  Yes Shah, Pratik D, DO  citalopram (CELEXA) 20 MG tablet TAKE 1 TABLET(20 MG) BY MOUTH DAILY Patient taking differently: Take 20 mg by mouth at bedtime. 02/25/23  Yes Cook, Jayce G, DO  empagliflozin (JARDIANCE) 10 MG TABS tablet Take 1 tablet (10 mg total) by mouth daily before breakfast. Patient taking differently: Take 10 mg by mouth at bedtime. 07/14/23  Yes Cook, Jayce G, DO  losartan (COZAAR) 25 MG tablet Take 1 tablet (25 mg total) by mouth daily. Patient taking differently: Take 25 mg by mouth at bedtime. 06/26/23  Yes Cook, Jayce G, DO  nicotine (NICODERM CQ - DOSED IN MG/24 HOURS) 21 mg/24hr patch APPLY 1 PATCH(21 MG) TOPICALLY TO THE SKIN DAILY Patient taking differently: Place 21 mg onto the skin daily. 04/19/23  Yes Cook, Jayce G, DO  rosuvastatin (CRESTOR) 20 MG tablet Take 1 tablet (20 mg total) by mouth daily. Patient taking differently: Take 20 mg by mouth at bedtime. 01/03/23  Yes Cook, Jayce G, DO  TRELEGY ELLIPTA 100-62.5-25 MCG/ACT AEPB INHALE 1 PUFF INTO THE LUNGS DAILY Patient taking differently: Inhale 1 puff into the lungs in the morning. 07/22/23  Yes [name], DO     Critical care time: n/a     Karren Burly, MD See Loretha Stapler

## 2023-10-04 NOTE — Plan of Care (Signed)
  Problem: Education: Goal: Knowledge of disease or condition will improve Outcome: Progressing Goal: Knowledge of secondary prevention will improve (MUST DOCUMENT ALL) Outcome: Progressing   Problem: Ischemic Stroke/TIA Tissue Perfusion: Goal: Complications of ischemic stroke/TIA will be minimized Outcome: Progressing   Problem: Coping: Goal: Will verbalize positive feelings about self Outcome: Progressing   Problem: Self-Care: Goal: Ability to participate in self-care as condition permits will improve Outcome: Progressing   Problem: Nutrition: Goal: Risk of aspiration will decrease Outcome: Progressing Goal: Dietary intake will improve Outcome: Progressing   Problem: Clinical Measurements: Goal: Will remain free from infection Outcome: Progressing Goal: Diagnostic test results will improve Outcome: Progressing Goal: Respiratory complications will improve Outcome: Progressing   Problem: Safety: Goal: Ability to remain free from injury will improve Outcome: Progressing

## 2023-10-04 NOTE — Progress Notes (Signed)
 STROKE TEAM PROGRESS NOTE    INTERIM HISTORY/SUBJECTIVE  Patient is sitting up in a chair.  States he is doing well.  He has no complaints.  He is lung biopsy scheduled for next week.  Recommend we hold Plavix for the biopsy and resume it after the biopsy.  Continue aspirin if okay with pulmonary team.    CBC    Component Value Date/Time   WBC 7.4 10/04/2023 0523   RBC 4.37 10/04/2023 0523   HGB 12.9 (L) 10/04/2023 0523   HGB 15.5 12/26/2022 0808   HCT 39.0 10/04/2023 0523   HCT 48.1 12/26/2022 0808   PLT 304 10/04/2023 0523   PLT 318 12/26/2022 0808   MCV 89.2 10/04/2023 0523   MCV 94 12/26/2022 0808   MCH 29.5 10/04/2023 0523   MCHC 33.1 10/04/2023 0523   RDW 13.1 10/04/2023 0523   RDW 12.3 12/26/2022 0808   LYMPHSABS 1.6 10/02/2023 1015   LYMPHSABS 2.0 04/02/2017 0833   MONOABS 0.7 10/02/2023 1015   EOSABS 0.1 10/02/2023 1015   EOSABS 0.1 04/02/2017 0833   BASOSABS 0.0 10/02/2023 1015   BASOSABS 0.0 04/02/2017 0833    BMET    Component Value Date/Time   NA 137 10/04/2023 0523   NA 138 06/26/2023 0907   K 3.9 10/04/2023 0523   CL 107 10/04/2023 0523   CO2 22 10/04/2023 0523   GLUCOSE 82 10/04/2023 0523   BUN 14 10/04/2023 0523   BUN 19 06/26/2023 0907   CREATININE 1.34 (H) 10/04/2023 0523   CALCIUM 8.5 (L) 10/04/2023 0523   EGFR 59 (L) 06/26/2023 0907   GFRNONAA 58 (L) 10/04/2023 0523    IMAGING past 24 hours ECHOCARDIOGRAM COMPLETE Result Date: 10/04/2023    ECHOCARDIOGRAM REPORT   Patient Name:   Michael Brady Date of Exam: 10/03/2023 Medical Rec #:  [id number]     Height:       69.0 in Accession #:    [id number]    Weight:       178.0 lb Date of Birth:  [birth year]      BSA:          1.966 m Patient Age:    68 years      BP:           137/100 mmHg Patient Gender: M             HR:           74 bpm. Exam Location:  Inpatient Procedure: 2D Echo (Both Spectral and Color Flow Doppler were utilized during            procedure). Indications:    TIA  History:         Patient has no prior history of Echocardiogram examinations.                 COPD; Risk Factors:Dyslipidemia and Current Smoker.  Sonographer:    Delcie Roch RDCS Referring Phys: [id number] Cecille Po MELVIN  Sonographer Comments: Image acquisition challenging due to respiratory motion. IMPRESSIONS  1. Left ventricular ejection fraction, by estimation, is 60 to 65%. The left ventricle has normal function. The left ventricle has no regional wall motion abnormalities. There is mild left ventricular hypertrophy. Left ventricular diastolic parameters were grossly normal.  2. Right ventricular systolic function is normal. The right ventricular size is mildly enlarged. Tricuspid regurgitation signal is inadequate for assessing PA pressure.  3. The mitral valve is grossly normal. Trivial mitral valve regurgitation. No evidence of  mitral stenosis.  4. The aortic valve is tricuspid. Aortic valve regurgitation is not visualized. No aortic stenosis is present.  5. The inferior vena cava is normal in size with greater than 50% respiratory variability, suggesting right atrial pressure of 3 mmHg. Conclusion(s)/Recommendation(s): No intracardiac source of embolism detected on this transthoracic study. Consider a transesophageal echocardiogram to exclude cardiac source of embolism if clinically indicated. FINDINGS  Left Ventricle: Left ventricular ejection fraction, by estimation, is 60 to 65%. The left ventricle has normal function. The left ventricle has no regional wall motion abnormalities. The left ventricular internal cavity size was normal in size. There is  mild left ventricular hypertrophy. Left ventricular diastolic parameters were normal. Right Ventricle: The right ventricular size is mildly enlarged. No increase in right ventricular wall thickness. Right ventricular systolic function is normal. Tricuspid regurgitation signal is inadequate for assessing PA pressure. Left Atrium: Left atrial size was normal in size.  Right Atrium: Right atrial size was normal in size. Pericardium: There is no evidence of pericardial effusion. Mitral Valve: The mitral valve is grossly normal. Trivial mitral valve regurgitation. No evidence of mitral valve stenosis. Tricuspid Valve: The tricuspid valve is grossly normal. Tricuspid valve regurgitation is trivial. No evidence of tricuspid stenosis. Aortic Valve: The aortic valve is tricuspid. Aortic valve regurgitation is not visualized. No aortic stenosis is present. Pulmonic Valve: The pulmonic valve was not well visualized. Pulmonic valve regurgitation is trivial. No evidence of pulmonic stenosis. Aorta: The aortic root is normal in size and structure. Venous: The inferior vena cava is normal in size with greater than 50% respiratory variability, suggesting right atrial pressure of 3 mmHg. IAS/Shunts: The atrial septum is grossly normal.  LEFT VENTRICLE PLAX 2D LVIDd:         4.60 cm   Diastology LVIDs:         3.10 cm   LV e' medial:    7.62 cm/s LV PW:         0.90 cm   LV E/e' medial:  8.0 LV IVS:        1.00 cm   LV e' lateral:   10.80 cm/s LVOT diam:     2.20 cm   LV E/e' lateral: 5.7 LV SV:         67 LV SV Index:   34 LVOT Area:     3.80 cm  RIGHT VENTRICLE             IVC RV Basal diam:  2.40 cm     IVC diam: 1.80 cm RV S prime:     21.10 cm/s TAPSE (M-mode): 2.9 cm LEFT ATRIUM             Index        RIGHT ATRIUM           Index LA diam:        3.40 cm 1.73 cm/m   RA Area:     11.00 cm LA Vol (A2C):   30.6 ml 15.56 ml/m  RA Volume:   22.60 ml  11.49 ml/m LA Vol (A4C):   21.0 ml 10.68 ml/m LA Biplane Vol: 26.9 ml 13.68 ml/m  AORTIC VALVE LVOT Vmax:   105.00 cm/s LVOT Vmean:  62.100 cm/s LVOT VTI:    0.176 m  AORTA Ao Root diam: 3.30 cm MITRAL VALVE MV Area (PHT): 2.60 cm    SHUNTS MV Decel Time: 292 msec    Systemic VTI:  0.18 m MV E velocity: 61.30 cm/s  Systemic Diam: 2.20 cm MV A velocity: 61.70 cm/s MV E/A ratio:  0.99 Weston Brass MD Electronically signed by Weston Brass MD Signature Date/Time: 10/04/2023/10:46:50 AM    Final     Vitals:   10/04/23 0000 10/04/23 0007 10/04/23 0400 10/04/23 0748  BP: 116/78  111/61   Pulse: 60  (!) 58   Resp: 20  17   Temp:  97.9 F (36.6 C) 98.5 F (36.9 C) 98.5 F (36.9 C)  TempSrc:   Oral Oral  SpO2: 94%  93%   Weight:      Height:         PHYSICAL EXAM General:  Alert, well-nourished, well-developed middle-age Caucasian male in no acute distress Psych:  Mood and affect appropriate for situation CV: Regular rate and rhythm on monitor Respiratory:  Regular, unlabored respirations on room air GI: Abdomen soft and nontender   NEURO:  Mental Status: AA&Ox3, patient is able to give clear and coherent history Speech/Language: speech is without dysarthria or aphasia.  Naming, repetition, fluency, and comprehension intact.  Cranial Nerves:  II: PERRL. Visual fields full.  III, IV, VI: EOMI. Eyelids elevate symmetrically.  V: Sensation is intact to light touch and symmetrical to face.  VII: Face is symmetrical resting and smiling VIII: hearing intact to voice. IX, X: Palate elevates symmetrically. Phonation is normal.  ZO:[id number] shrug 5/5. XII: tongue is midline without fasciculations. Motor: 5/5 strength to all muscle groups tested.  Tone: is normal and bulk is normal Sensation- Intact to light touch bilaterally. Extinction absent to light touch to DSS.   Coordination: FTN intact bilaterally, HKS: no ataxia in BLE.No drift.  Gait- deferred  Most Recent NIH 0   ASSESSMENT/PLAN  Michael Brady is a 68 y.o. male with history of left renal pelvis cancer, current smoker, hyperlipidemia, hypertension, COPD, depression admitted for vision loss.  NIH on Admission 0  Acute Ischemic Infarct:  left caudate likely clinically asymptomatic. Etiology: Small vessel disease versus hypercoagulable due to new lung cancer CTA head & neck no LVO. positive for widespread atherosclerosis, and positive for  widespread intracranial  arterial dolichoectasia. Right upper lobe Spiculated Lung Mass superimposed on Emphysema  MRI  small acute left caudate infarct 2D Echo ejection fraction 60 to 65%.  Left atrium size is normal LDL 52 HgbA1c 5.5 VTE prophylaxis -SCDs No antithrombotic prior to admission, now on aspirin 81 mg daily alone as patient likely to need emergent lung biopsy so will not start Plavix.  Can add Plavix after lung biopsy Therapy recommendations:  Pending Disposition: Pending  Hypertension Home meds: Losartan 25 mg Stable Blood Pressure Goal: SBP less than 160   Hyperlipidemia Home meds: Crestor 20 mg,  resumed in hospital LDL 52, goal < 7 Continue statin at discharge  Diabetes type II Controlled Home meds: Jardiance HgbA1c 5.5, goal < 7.0 CBGs SSI Recommend close follow-up with PCP for better DM control  Right upper lobe lung mass Pulmonary consulted Biopsy probably next week  Tobacco Abuse Patient smokes 3 to 4 cigarettes a day       Ready to quit? Yes Nicotine replacement therapy provided  Dysphagia Patient has post-stroke dysphagia, SLP consulted    Diet   Diet Heart Room service appropriate? Yes; Fluid consistency: Thin   Advance diet as tolerated  Other Stroke Risk Factors ETOH use, alcohol level <10, advised to drink no more than 2 drink(s) a day   Other Active Problems Depression COPD  Hospital day # 0  Patient presented with painless loss of vision in the left eye due to retinal artery branch occlusion which appears to be partially improving.  MRI shows a clinically silent small left caudate lacunar infarct likely from small vessel disease.  He has also been found to have right upper lobe lung mass which will need biopsy hence recommend start aspirin alone and can consider adding Plavix after biopsy. Aggressive risk factor modification.  Patient counseled to quit smoking.  From discussion patient and answered questions.  Discussed with Dr.  Thedore Mins.  Greater than 50% time during this 35 minute visit was spent in counseling and coordination of care about his incidental lacunar stroke and retinal artery branch occlusion and answering questions.  Delia Heady, MD Medical Director [facility] Pager: [phone] 10/04/2023 2:34 PM   To contact Stroke Continuity provider, please refer to [email or web address]. After hours, contact General Neurology

## 2023-10-04 NOTE — Progress Notes (Signed)
 NAME:  Michael Brady, MRN:  [id number], DOB:  [birth year], LOS: 0 ADMISSION DATE:  10/02/2023, CONSULTATION DATE: 10/03/2023 REFERRING MD: TRH, CHIEF COMPLAINT: Vision changes  History of Present Illness:  68 year old man history of tobacco abuse current smoker of emphysema on CT scan presented with vision changes after ophthalmic exam with incidental lung mass.  Denies significant cough.  No worsening shortness of breath or dyspnea on exertion.  Breathing feels fine.  He is a current smoker.  Smoking 3 to 4-day.  Smoked more in the past.  CT angiogram of the head and neck demonstrated significant emphysema and a 2 cm right upper lobe nodule.  CT abdomen pelvis chest with contrast thereafter confirmed to similar mass without mediastinal lymphadenopathy per report, I think there is a borderline 10R/11R just over 1 cm on my measurement but I see no significant lymphadenopathy at station 7 or station 4R, no significant lymphadenopathy on the left.  Discussed at length with patient this is most likely cancer based on appearance and size and location.  Advised him to quit smoking.  We discussed biopsy versus referral for to thoracic surgery for resection.  He would prefer a diagnosis prior to committing to possible surgical resection.  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 Doing well.   Objective   Blood pressure 111/61, pulse (!) 58, temperature 98.5 F (36.9 C), temperature source Oral, resp. rate 17, height 5\' 9"  (1.753 m), weight 80.7 kg, SpO2 93%.       No intake or output data in the 24 hours ending 10/04/23  1126 Filed Weights   10/02/23 0948  Weight: 80.7 kg    Examination: General: Well-appearing, sitting up in bed HENT: Atraumatic normocephalic Lungs: Normal work of breathing, on room air Cardiovascular: Regular rate and rhythm no edema Abdomen: Nondistended Neuro: Reports stable visual field deficits, otherwise no focal deficits on my exam  Resolved Hospital Problem list   N/a  Assessment & Plan:   Lung mass: 2 cm RUL spiculated nodule. Mayo risk 90.5% of malignancy. Discussed with patient referral straight to surgery vs biopsy. He would prefer biopsy first. No significant LAD on CT scan. --Nav bronch with biopsy 4/1, pt instructed on NPO at [facility], arrive at 12 noon, needs ride home due to anaesthesia administration  --No plan for DAPT after discussion with neurology, ok to proceed on ASA monotherapy  Tobacco abuse: Patient was strongly counseled that he should strictly abstain from all cigarette smoking moving forward  Best Practice (right click and "Reselect all SmartList Selections" daily)   Per primary  Labs   CBC: Recent Labs  Lab 10/02/23 1015 10/03/23 0514 10/04/23 0523  WBC 8.6 8.8 7.4  NEUTROABS 6.2  --   --   HGB 13.4 12.9* 12.9*  HCT 42.3 39.6 39.0  MCV 92.4 90.0 89.2  PLT 309 302 304    Basic Metabolic Panel: Recent Labs  Lab 10/02/23 1015 10/03/23 0514 10/04/23 0523  NA 138 135 137  K 5.0 4.0 3.9  CL 104 104 107  CO2 23 21* 22  GLUCOSE 90 98 82  BUN 16 14 14   CREATININE 1.41* 1.35* 1.34*  CALCIUM 9.4  9.0 8.5*   GFR: Estimated Creatinine Clearance: 54.2 mL/min (A) (by C-G formula based on SCr of 1.34 mg/dL (H)). Recent Labs  Lab 10/02/23 1015 10/03/23 0514 10/04/23 0523  WBC 8.6 8.8 7.4    Liver Function Tests: Recent Labs  Lab 10/02/23 1015 10/03/23 0514  AST 25 20  ALT 14 15  ALKPHOS 67 66  BILITOT 0.7 0.8  PROT 7.5 6.9  ALBUMIN 3.9 3.5   No results for input(s): "LIPASE", "AMYLASE" in the last 168 hours. No results for  input(s): "AMMONIA" in the last 168 hours.  ABG No results found for: "PHART", "PCO2ART", "PO2ART", "HCO3", "TCO2", "ACIDBASEDEF", "O2SAT"   Coagulation Profile: Recent Labs  Lab 10/02/23 1015  INR 1.0    Cardiac Enzymes: No results for input(s): "CKTOTAL", "CKMB", "CKMBINDEX", "TROPONINI" in the last 168 hours.  HbA1C: Hgb A1c MFr Bld  Date/Time Value Ref Range Status  10/03/2023 05:14 AM 5.5 4.8 - 5.6 % Final    Comment:    (NOTE) Pre diabetes:          5.7%-6.4%  Diabetes:              >6.4%  Glycemic control for   <7.0% adults with diabetes     CBG: No results for input(s): "GLUCAP" in the last 168 hours.  Review of Systems:   No chest pain no orthopnea or PND no increase SOB comprehensive review of systems otherwise negative   Past Medical History:  He,  has a past medical history of Cancer (HCC), Diverticulosis of colon, Dysuria, Hematuria, History of closed dislocation of shoulder (06/2014), PONV (postoperative nausea and vomiting), Renal mass, left, Smokers' cough (HCC), Wears dentures, and Wears glasses.   Surgical History:   Past Surgical History:  Procedure Laterality Date   CARPAL TUNNEL RELEASE Left 01-25-2006   dr Ophelia Charter  [facility]   and LEFT THUMB PULLEY RELEASE   COLONOSCOPY N/A 05/07/2017   Procedure: COLONOSCOPY;  Surgeon: Franky Macho, MD;  Location: [facility];  Service: Gastroenterology;  Laterality: N/A;   CYSTOSCOPY W/ RETROGRADES Right 07/25/2018   Procedure: CYSTOSCOPY WITH RETROGRADE PYELOGRAM;  Surgeon: Sebastian Ache, MD;  Location: [facility];  Service: Urology;  Laterality: Right;   CYSTOSCOPY WITH URETEROSCOPY AND STENT PLACEMENT Left 07/29/2017   Procedure: CYSTOSCOPY WITH URETEROSCOPY AND STENT PLACEMENT;  Surgeon: Hildred Laser, MD;  Location: [facility];  Service: Urology;  Laterality: Left;   CYSTOSCOPY/URETEROSCOPY/HOLMIUM LASER/STENT PLACEMENT Left 07/15/2017   Procedure: CYSTOSCOPY, ATTEMTED  URETEROSCOPY/,STENT PLACEMENT;  Surgeon: Hildred Laser, MD;  Location: [facility];  Service: Urology;  Laterality: Left;   CYSTOSCOPY/URETEROSCOPY/HOLMIUM LASER/STENT PLACEMENT Left 10/21/2017   Procedure: CYSTOSCOPY, RETROGRADE /URETEROSCOPY LEFT UPPER TRACT,BIOPSY, Manning Charity LASER/STENT PLACEMENT;  Surgeon: Hildred Laser, MD;  Location: [facility];  Service: Urology;  Laterality: Left;   GASTRORRHAPHY N/A 08/26/2021   Procedure: GASTRORRHAPHY;  Surgeon: Lewie Chamber, DO;  Location: [facility];  Service: General;  Laterality: N/A;   HOLMIUM LASER APPLICATION Left 10/21/2017   Procedure: HOLMIUM LASER APPLICATION;  Surgeon: Hildred Laser, MD;  Location: [facility];  Service: Urology;  Laterality: Left;   INCISIONAL HERNIA REPAIR Right 08/26/2021   Procedure: HERNIA REPAIR INCISIONAL;  Surgeon: Lewie Chamber, DO;  Location: [facility];  Service: General;  Laterality: Right;   LAPAROTOMY N/A 08/26/2021   Procedure: EXPLORATORY LAPAROTOMY;  Surgeon: Lewie Chamber, DO;  Location: [facility];  Service: General;  Laterality: N/A;  ROBOT ASSITED LAPAROSCOPIC NEPHROURETERECTOMY Left 12/11/2017   Procedure: XI ROBOT ASSITED LAPAROSCOPIC NEPHROURETERECTOMY;  Surgeon: Sebastian Ache, MD;  Location: [facility];  Service: Urology;  Laterality: Left;   SHOULDER SURGERY Right 1997   ROTATOR CUFF REPAIR    THULIUM LASER TURP (TRANSURETHRAL RESECTION OF PROSTATE) Left 07/29/2017   Procedure: CYSTOSCOPY, LEFT URETEROSCOPY WITH TUMOR BIOPSYTHULLIUM LASER ABLATION OF TUMOR AND LEFT URETERAL STENT EXCHANGE;  Surgeon: Hildred Laser, MD;  Location: [facility];  Service: Urology;  Laterality: Left;   TRANSURETHRAL RESECTION OF BLADDER TUMOR N/A 07/25/2018   Procedure: TRANSURETHRAL RESECTION OF BLADDER TUMOR (TURBT);  Surgeon: Sebastian Ache, MD;  Location: [facility];  Service: Urology;  Laterality: N/A;    WISDOM TOOTH EXTRACTION       Social History:   reports that he has been smoking cigarettes. He has a 60 pack-year smoking history. He has never used smokeless tobacco. He reports that he does not drink alcohol and does not use drugs.   Family History:  His family history is negative for Colon cancer.   Allergies No Known Allergies   Home Medications  Prior to Admission medications   Medication Sig Start Date End Date Taking? Authorizing Provider  acetaminophen (TYLENOL) 500 MG tablet Take 2 tablets (1,000 mg total) by mouth every 6 (six) hours. 08/31/21  Yes Shah, Pratik D, DO  citalopram (CELEXA) 20 MG tablet TAKE 1 TABLET(20 MG) BY MOUTH DAILY Patient taking differently: Take 20 mg by mouth at bedtime. 02/25/23  Yes Cook, Jayce G, DO  empagliflozin (JARDIANCE) 10 MG TABS tablet Take 1 tablet (10 mg total) by mouth daily before breakfast. Patient taking differently: Take 10 mg by mouth at bedtime. 07/14/23  Yes Cook, Jayce G, DO  losartan (COZAAR) 25 MG tablet Take 1 tablet (25 mg total) by mouth daily. Patient taking differently: Take 25 mg by mouth at bedtime. 06/26/23  Yes Cook, Jayce G, DO  nicotine (NICODERM CQ - DOSED IN MG/24 HOURS) 21 mg/24hr patch APPLY 1 PATCH(21 MG) TOPICALLY TO THE SKIN DAILY Patient taking differently: Place 21 mg onto the skin daily. 04/19/23  Yes Cook, Jayce G, DO  rosuvastatin (CRESTOR) 20 MG tablet Take 1 tablet (20 mg total) by mouth daily. Patient taking differently: Take 20 mg by mouth at bedtime. 01/03/23  Yes Cook, Jayce G, DO  TRELEGY ELLIPTA 100-62.5-25 MCG/ACT AEPB INHALE 1 PUFF INTO THE LUNGS DAILY Patient taking differently: Inhale 1 puff into the lungs in the morning. 07/22/23  Yes [name], DO     Critical care time: n/a     Karren Burly, MD See Loretha Stapler

## 2023-10-04 NOTE — Discharge Summary (Signed)
 Michael Brady ZDG:[id number] DOB: September 20, 1955 DOA: 10/02/2023  PCP: [name], DO  Admit date: 10/02/2023  Discharge date: 10/04/2023  Admitted From: Home   Disposition:  Home   Recommendations for Outpatient Follow-up:   Follow up with PCP in 1-2 weeks  PCP Please obtain BMP/CBC, 2 view CXR in 1week,  (see Discharge instructions)   PCP Please follow up on the following pending results:   You must follow-up with the recommended pulmonary, urology and neurology physicians in 1 to 2 weeks.  If the pulmonary and bladder biopsies are done within 3 weeks of stroke then Plavix can be commenced.  If the biopsies are done beyond 3 weeks of acute stroke then continue aspirin only.   Home Health: None   Equipment/Devices: None  Consultations: Neuro, PCCM Discharge Condition: Stable     CODE STATUS: Full     Diet Recommendation: Heart Healthy   Chief Complaint  Patient presents with   Loss of Vision     Brief history of present illness from the day of admission and additional interim summary    68 year old gentleman history of hypertension, hyperlipidemia, PUD, COPD, ongoing tobacco use, depression, history of renal pelvic cancer presenting with sudden onset visual loss. Patient seen by his ophthalmologist on the day of admission due to concern for embolism patient sent to the ED for further evaluation. Patient seen in the ED, MRI done concerning for acute CVA. CT angiogram head and neck showed a spiculated right upper lobe lung mass concerning for carcinoma. Emphysema also noted. No large vessel occlusion noted. Positive for widespread atherosclerosis. Moderate stenosis of the vertebral arteries and right M1. Neurology consulted recommended admission for stroke workup                                                                   Hospital Course    #1 Acute CVA/retinal artery branch occlusion  -Patient presented with sudden onset left eye vision loss that started on the day of admission, seen by ophthalmologist who noted evidence of embolism and patient sent to the ED for stroke workup. MRI brain done consistent with acute CVA and neurology consulted.  CT angiogram head and neck showed no LVO.  Did show widespread atherosclerosis and moderate to severe stenosis in multiple vessels including severe changes at the right PCA.  Incidental finding of right upper lobe lung mass noted.  Echo stable, LDL and A1c stable.  Had full stroke workup Case discussed with stroke team Dr. Pearlean Brownie discharged on aspirin and statin.  Kindly see antiplatelet discussion below.     2.  Right upper lobe spiculated mass  - -Noted on CT angiogram head and neck as well as CT chest abdomen and pelvis.  Seen by pulmonary outpatient biopsy, currently see discussion on antiplatelet  below  Plan discussed with neurologist Dr. Pearlean Brownie.   If the pulmonary and bladder biopsies are done within 3 weeks of stroke then Plavix can be commenced.  If the biopsies are done beyond 3 weeks of acute stroke then continue aspirin only.    3.  Bladder wall masses  - Noted on CT abdomen and pelvis.  Will need outpatient follow-up with urology for further evaluation and management.   4.  Hypertension  - Permissive hypertension.  Continue to hold home regimen Cozaar from 10/06/23.   5.  COPD  - Stable. Continue home regimen Trelegy.   6.  Tobacco abuse Tobacco cessation stressed to patient. Nicotine patch.   7.  Hyperlipidemia  - LDL of 52.  Continue home regimen Crestor.   8.  Depression  - Continue home regimen Celexa.   9.  Peptic ulcer disease  - PPI.  10.  CKD 3B.    Close to baseline continue to monitor   Discharge diagnosis     Principal Problem:   Acute CVA (cerebrovascular accident) [facility]) Active Problems:   Hyperlipidemia   Depression, major,  single episode, moderate (HCC)   History of renal pelvis cancer   PUD (peptic ulcer disease)   COPD (chronic obstructive pulmonary disease) (HCC)   Essential hypertension   Mass of upper lobe of right lung    Discharge instructions    Discharge Instructions     Diet - low sodium heart healthy   Complete by: As directed    Discharge instructions   Complete by: As directed    Follow with Primary MD [name], DO in 7 days, follow-up with the pulmonologist, urologist in 1 to 2 weeks.  Neurologist in 3 weeks.  Get CBC, CMP, 2 view Chest X ray -  checked next visit with your primary MD   Activity: As tolerated with Full fall precautions use walker/cane & assistance as needed  Disposition Home    Diet: Heart Healthy    Special Instructions: If you have smoked or chewed Tobacco  in the last 2 yrs please stop smoking, stop any regular Alcohol  and or any Recreational drug use.  On your next visit with your primary care physician please Get Medicines reviewed and adjusted.  Please request your Prim.MD to go over all Hospital Tests and Procedure/Radiological results at the follow up, please get all Hospital records sent to your Prim MD by signing hospital release before you go home.  If you experience worsening of your admission symptoms, develop shortness of breath, life threatening emergency, suicidal or homicidal thoughts you must seek medical attention immediately by calling 911 or calling your MD immediately  if symptoms less severe.  You Must read complete instructions/literature along with all the possible adverse reactions/side effects for all the Medicines you take and that have been prescribed to you. Take any new Medicines after you have completely understood and accpet all the possible adverse reactions/side effects.   Do not drive when taking Pain medications.  Do not take more than prescribed Pain, Sleep and Anxiety Medications  Wear Seat belts while driving.    Increase activity slowly   Complete by: As directed        Discharge Medications   Allergies as of 10/04/2023   No Known Allergies      Medication List     TAKE these medications    acetaminophen 500 MG tablet Commonly known as: TYLENOL Take 2 tablets (1,000 mg total) by mouth every 6 (six)  hours.   aspirin EC 81 MG tablet Take 1 tablet (81 mg total) by mouth daily. Swallow whole. Start taking on: October 05, 2023   citalopram 20 MG tablet Commonly known as: CELEXA TAKE 1 TABLET(20 MG) BY MOUTH DAILY What changed: See the new instructions.   empagliflozin 10 MG Tabs tablet Commonly known as: Jardiance Take 1 tablet (10 mg total) by mouth daily before breakfast. What changed: when to take this   losartan 25 MG tablet Commonly known as: COZAAR Take 1 tablet (25 mg total) by mouth daily. Start taking on: October 06, 2023 What changed: These instructions start on October 06, 2023. If you are unsure what to do until then, ask your doctor or other care provider.   nicotine 21 mg/24hr patch Commonly known as: NICODERM CQ - dosed in mg/24 hours APPLY 1 PATCH(21 MG) TOPICALLY TO THE SKIN DAILY What changed: See the new instructions.   rosuvastatin 20 MG tablet Commonly known as: Crestor Take 1 tablet (20 mg total) by mouth daily. What changed: when to take this   Trelegy Ellipta 100-62.5-25 MCG/ACT Aepb Generic drug: Fluticasone-Umeclidin-Vilant INHALE 1 PUFF INTO THE LUNGS DAILY What changed: when to take this         Follow-up Information     [name], DO. Schedule an appointment as soon as possible for a visit in 1 week(s).   Specialty: Family Medicine Contact information: [address] [place] [address] [phone]         [facility]. Schedule an appointment as soon as possible for a visit in 2 week(s).   Contact information: [address]     [address] [place] [address] [phone]         Hunsucker, Lesia Sago, MD. Schedule an appointment as soon as possible for a visit in 1 week(s).   Specialty: Pulmonary Disease Contact information: [address] [place] [address] [phone]         Loletta Parish., MD. Schedule an appointment as soon as possible for a visit in 1 week(s).   Specialty: Urology Why: Bladder mass, may require cystoscopy Contact information: [address] [place] [address] [phone]                 Major procedures and Radiology Reports - PLEASE review detailed and final reports thoroughly  -     ECHOCARDIOGRAM COMPLETE Result Date: 10/04/2023    ECHOCARDIOGRAM REPORT   Patient Name:   CHARELS STAMBAUGH Date of Exam: 10/03/2023 Medical Rec #:  [id number]     Height:       69.0 in Accession #:    [id number]    Weight:       178.0 lb Date of Birth:  June 08, 1956      BSA:          1.966 m Patient Age:    68 years      BP:           137/100 mmHg Patient Gender: M             HR:           74 bpm. Exam Location:  Inpatient Procedure: 2D Echo (Both Spectral and Color Flow Doppler were utilized during            procedure). Indications:    TIA  History:        Patient has no prior history of Echocardiogram  examinations.                 COPD; Risk Factors:Dyslipidemia and Current Smoker.  Sonographer:    Delcie Roch RDCS Referring Phys: [id number] Cecille Po MELVIN  Sonographer Comments: Image acquisition challenging due to respiratory motion. IMPRESSIONS  1. Left ventricular ejection fraction, by estimation, is 60 to 65%. The left ventricle has normal function. The left ventricle has no regional wall motion abnormalities. There is mild left ventricular hypertrophy. Left ventricular diastolic parameters were grossly normal.  2. Right ventricular systolic function is normal. The right ventricular size is mildly enlarged. Tricuspid regurgitation signal is inadequate for assessing PA pressure.  3. The mitral valve is grossly normal.  Trivial mitral valve regurgitation. No evidence of mitral stenosis.  4. The aortic valve is tricuspid. Aortic valve regurgitation is not visualized. No aortic stenosis is present.  5. The inferior vena cava is normal in size with greater than 50% respiratory variability, suggesting right atrial pressure of 3 mmHg. Conclusion(s)/Recommendation(s): No intracardiac source of embolism detected on this transthoracic study. Consider a transesophageal echocardiogram to exclude cardiac source of embolism if clinically indicated. FINDINGS  Left Ventricle: Left ventricular ejection fraction, by estimation, is 60 to 65%. The left ventricle has normal function. The left ventricle has no regional wall motion abnormalities. The left ventricular internal cavity size was normal in size. There is  mild left ventricular hypertrophy. Left ventricular diastolic parameters were normal. Right Ventricle: The right ventricular size is mildly enlarged. No increase in right ventricular wall thickness. Right ventricular systolic function is normal. Tricuspid regurgitation signal is inadequate for assessing PA pressure. Left Atrium: Left atrial size was normal in size. Right Atrium: Right atrial size was normal in size. Pericardium: There is no evidence of pericardial effusion. Mitral Valve: The mitral valve is grossly normal. Trivial mitral valve regurgitation. No evidence of mitral valve stenosis. Tricuspid Valve: The tricuspid valve is grossly normal. Tricuspid valve regurgitation is trivial. No evidence of tricuspid stenosis. Aortic Valve: The aortic valve is tricuspid. Aortic valve regurgitation is not visualized. No aortic stenosis is present. Pulmonic Valve: The pulmonic valve was not well visualized. Pulmonic valve regurgitation is trivial. No evidence of pulmonic stenosis. Aorta: The aortic root is normal in size and structure. Venous: The inferior vena cava is normal in size with greater than 50% respiratory variability, suggesting  right atrial pressure of 3 mmHg. IAS/Shunts: The atrial septum is grossly normal.  LEFT VENTRICLE PLAX 2D LVIDd:         4.60 cm   Diastology LVIDs:         3.10 cm   LV e' medial:    7.62 cm/s LV PW:         0.90 cm   LV E/e' medial:  8.0 LV IVS:        1.00 cm   LV e' lateral:   10.80 cm/s LVOT diam:     2.20 cm   LV E/e' lateral: 5.7 LV SV:         67 LV SV Index:   34 LVOT Area:     3.80 cm  RIGHT VENTRICLE             IVC RV Basal diam:  2.40 cm     IVC diam: 1.80 cm RV S prime:     21.10 cm/s TAPSE (M-mode): 2.9 cm LEFT ATRIUM             Index  RIGHT ATRIUM           Index LA diam:        3.40 cm 1.73 cm/m   RA Area:     11.00 cm LA Vol (A2C):   30.6 ml 15.56 ml/m  RA Volume:   22.60 ml  11.49 ml/m LA Vol (A4C):   21.0 ml 10.68 ml/m LA Biplane Vol: 26.9 ml 13.68 ml/m  AORTIC VALVE LVOT Vmax:   105.00 cm/s LVOT Vmean:  62.100 cm/s LVOT VTI:    0.176 m  AORTA Ao Root diam: 3.30 cm MITRAL VALVE MV Area (PHT): 2.60 cm    SHUNTS MV Decel Time: 292 msec    Systemic VTI:  0.18 m MV E velocity: 61.30 cm/s  Systemic Diam: 2.20 cm MV A velocity: 61.70 cm/s MV E/A ratio:  0.99 Weston Brass MD Electronically signed by Weston Brass MD Signature Date/Time: 10/04/2023/10:46:50 AM    Final    CT CHEST ABDOMEN PELVIS W CONTRAST Result Date: 10/02/2023 CLINICAL DATA:  Lung nodule EXAM: CT CHEST, ABDOMEN, AND PELVIS WITH CONTRAST TECHNIQUE: Multidetector CT imaging of the chest, abdomen and pelvis was performed following the standard protocol during bolus administration of intravenous contrast. RADIATION DOSE REDUCTION: This exam was performed according to the departmental dose-optimization program which includes automated exposure control, adjustment of the mA and/or kV according to patient size and/or use of iterative reconstruction technique. CONTRAST:  75mL OMNIPAQUE IOHEXOL 350 MG/ML SOLN COMPARISON:  CT angiogram head and neck same day. CT abdomen and pelvis 09/06/2021. FINDINGS: CT CHEST FINDINGS  Cardiovascular: No significant vascular findings. Normal heart size. No pericardial effusion. There are atherosclerotic calcifications of the aorta and coronary arteries. Mediastinum/Nodes: No enlarged mediastinal, hilar, or axillary lymph nodes. Thyroid gland, trachea, and esophagus demonstrate no significant findings. Lungs/Pleura: Severe emphysematous changes are present. There is a spiculated nodule in the right upper lobe measuring 2.0 x 1.0 by 1.1 cm with a small amount adjacent posterior right upper lobe airspace disease. There is right lower lobe peribronchial thickening diffusely. There is a cluster of tree-in-bud opacities nodular densities measuring up to 6 mm in the right lower lobe. There is no pleural effusion or pneumothorax. Musculoskeletal: There is trace compression deformity of the superior endplate of T10 and T5 which is favored as chronic. No acute fractures are seen. No focal osseous lesions are identified. CT ABDOMEN PELVIS FINDINGS Hepatobiliary: There are 2 subcentimeter hypodensities in the liver which are too small to characterize. Gallbladder and bile ducts are within normal limits. Pancreas: Unremarkable. No pancreatic ductal dilatation or surrounding inflammatory changes. Spleen: Normal in size without focal abnormality. Adrenals/Urinary Tract: Subcentimeter cortical hypodensities in the right kidney are favored as cysts. There is no hydronephrosis or perinephric fluid. The right adrenal gland appears within normal limits. There are too small filling defects along the superior bladder measuring up to 1 cm. Left nephrectomy changes are present. Left adrenal gland is within normal limits. Stomach/Bowel: There is sigmoid colon diverticulosis. There is diffuse wall thickening of the sigmoid colon without surrounding inflammation. There is also diverticulosis of the descending colon. Scattered air-fluid levels are seen throughout the colon. The appendix is within normal limits. Small bowel  and stomach are within normal limits. Vascular/Lymphatic: There is a 3.2 cm infrarenal abdominal aortic aneurysm. IVC is normal in size. There are atherosclerotic calcifications of the aorta. No enlarged lymph nodes are seen. Reproductive: Prostate is unremarkable. Other: There are 2 small to moderate-sized fat containing hernias above the level of the  umbilicus, midline. These are new from prior. No ascites. Musculoskeletal: No acute or significant osseous findings. IMPRESSION: 1. Spiculated nodule in the right upper lobe measuring up to 2.0 cm worrisome for malignancy. 2. Right lower lobe peribronchial thickening with cluster of tree-in-bud opacities in the right lower lobe with associated nodular densities measuring up to 6 mm. Findings are favored as infectious/inflammatory. Metastatic nodules in the right lower lobe are not excluded given above findings. 3. Severe emphysema. 4. Two small filling defects along the superior bladder measuring up to 1 cm may represent bladder wall masses. Recommend further evaluation with cystoscopy. 5. Diffuse wall thickening of the sigmoid colon without surrounding inflammation. Findings may be related to colitis or diverticulitis. 6. Scattered air-fluid levels throughout the colon compatible with diarrheal illness. 7. 3.2 cm infrarenal abdominal aortic aneurysm. Recommend follow-up ultrasound every 3 years. 8. Two new small to moderate-sized fat containing hernias above the level of the umbilicus. Aortic Atherosclerosis (ICD10-I70.0) and Emphysema (ICD10-J43.9). Electronically Signed   By: Darliss Cheney M.D.   On: 10/02/2023 19:20   CT ANGIO HEAD NECK W WO CM Result Date: 10/02/2023 CLINICAL DATA:  68 year old male with left eye vision loss. Small left caudate lacunar infarct on MRI this morning. EXAM: CT ANGIOGRAPHY HEAD AND NECK WITH AND WITHOUT CONTRAST TECHNIQUE: Multidetector CT imaging of the head and neck was performed using the standard protocol during bolus  administration of intravenous contrast. Multiplanar CT image reconstructions and MIPs were obtained to evaluate the vascular anatomy. Carotid stenosis measurements (when applicable) are obtained utilizing NASCET criteria, using the distal internal carotid diameter as the denominator. RADIATION DOSE REDUCTION: This exam was performed according to the departmental dose-optimization program which includes automated exposure control, adjustment of the mA and/or kV according to patient size and/or use of iterative reconstruction technique. CONTRAST:  75mL OMNIPAQUE IOHEXOL 350 MG/ML SOLN COMPARISON:  Brain MRI 1159 hours today. FINDINGS: CT HEAD Brain: Subtle posterior left caudate nucleus diffusion abnormality on MRI is occult by CT. No midline shift, ventriculomegaly, mass effect, evidence of mass lesion, intracranial hemorrhage or evidence of cortically based acute infarction. Calvarium and skull base: Intact. Paranasal sinuses: Visualized paranasal sinuses and mastoids are clear. Orbits: Visualized orbits and scalp soft tissues are within normal limits. CTA NECK Skeleton: Absent dentition. Cervical spine degeneration. No acute osseous abnormality identified. Upper chest: Emphysema, moderate to severe. Spiculated right upper lobe lung mass, nearly 3 cm long axis on series 12, image 58 (roughly 17 by 29 by 18 mm AP by transverse by CC). Retained secretions in the lower trachea. No visible mediastinal lymphadenopathy, although conspicuous right paratracheal lymph nodes measuring 6 mm. Other neck: Asymmetry of the larynx suggesting right vocal cord paresis or paralysis. No neck mass or lymphadenopathy identified. Aortic arch: Soft and calcified arch atherosclerosis. Three vessel arch configuration. Right carotid system: Brachiocephalic artery soft and calcified plaque without stenosis. Proximal right CCA obscured by dense right subclavian venous contrast streak artifact. Otherwise negative right CCA. Soft and calcified  plaque at the right ICA origin and bulb is mild and without stenosis. Tortuous right ICA below the skull base. Left carotid system: Left CCA origin, intermittent soft and calcified plaque which is up to moderate at the level of the subglottic larynx (series 11, image 134), up to 50% stenosis results. Soft and calcified plaque in the distal left CCA without stenosis. Moderate soft and calcified plaque at the left ICA origin and bulb with less than 50 % stenosis with respect to the distal vessel.  Tortuosity below the skull base. Vertebral arteries: Right subclavian origin plaque without stenosis. Normal right vertebral artery origin. Non dominant right vertebral artery is patent to the skull base with no significant plaque or stenosis. Proximal left subclavian artery soft more so than calcified plaque without stenosis. But calcified plaque at the left vertebral artery origin resulting in moderate stenosis. The left vertebral is dominant, mildly dolichoectatic. Additional soft and calcified plaque in the proximal V2 and the distal V3 segments, but no other hemodynamically significant left vertebral stenosis to the skull base. CTA HEAD Posterior circulation: Non dominant right vertebral artery terminates in PICA. Left vertebral supplies the basilar with up to moderate atherosclerotic stenosis at the vertebrobasilar junction series 15, image 34. Left PICA is patent. Tortuous basilar artery is patent without stenosis. Patent basilar tip. SCA and PCA origins are patent but there is moderate to severe stenosis of the right PCA origin on series 15, image 38. Up to moderate stenosis of the proximal left SCA. Posterior communicating arteries are diminutive or absent. Beaded appearance and moderate long segment stenosis of the left PCA P2 on series 16, image 32. Contralateral mild to moderate right P1 and P2 segment irregularity and stenosis. Maintained distal PCA enhancement. Anterior circulation: Both ICA siphons are patent.  Calcified left siphon plaque without stenosis. Both siphons are ectatic. Calcified right siphon plaque without stenosis. Patent carotid termini, MCA and ACA origins. Mildly dominant right A1. Anterior communicating artery and bilateral ACA branches are within normal limits. Mild irregularity and stenosis at the left MCA origin. Left MCA M1 and bifurcation are tad ache without additional stenosis. Left MCA branches are tortuous, without stenosis. Contralateral right MCA distal M1 irregularity and stenosis is moderate on series 15, image 42. Right MCA bifurcation is ectatic and patent without stenosis. Right MCA branches are tortuous, without stenosis. Venous sinuses: Early contrast timing. Superior sagittal sinus is patent. Anatomic variants: Dominant left vertebral artery supplies the basilar, the right terminates in PICA. Dominant right ACA A1. Review of the MIP images confirms the above findings IMPRESSION: 1. Right upper lobe Spiculated Lung Mass superimposed on Emphysema (ICD10-J43.9) is nearly 3 cm and considered Bronchogenic Carcinoma until proven otherwise. PET-CT and referral to [facility]) would be most valuable. 2. CTA is Negative for large vessel occlusion, positive for widespread atherosclerosis, and positive for widespread intracranial arterial dolichoectasia. - Moderate stenosis at both the origin of the Dominant Left Vertebral Artery and at its Vertebrobasilar junction. Right vertebral terminates in PICA. - Moderate stenosis of the Right M1 segment. Mild left MCA origin stenosis. - Moderate to Severe stenosis of the Right PCA origin. And up to stenoses of bilateral P1 and P2 segments. 3.  No new intracranial abnormality since the MRI this morning. 4. Aortic Atherosclerosis (ICD10-I70.0). Electronically Signed   By: Odessa Fleming M.D.   On: 10/02/2023 15:38   MR BRAIN WO CONTRAST Result Date: 10/02/2023 CLINICAL DATA:  Neuro deficit, acute, stroke suspected. Blurry  vision. EXAM: MRI HEAD WITHOUT CONTRAST TECHNIQUE: Multiplanar, multiecho pulse sequences of the brain and surrounding structures were obtained without intravenous contrast. COMPARISON:  None Available. FINDINGS: Brain: There is a 7 x 2 mm acute infarct involving the posterior left caudate body. There is no evidence of intracranial hemorrhage, mass, midline shift, or extra-axial fluid collection. Scattered small T2 hyperintensities in the cerebral white matter bilaterally are nonspecific but compatible with mildly age advanced chronic small vessel ischemic disease. Chronic lacunar infarcts are noted in the bilateral basal ganglia  and left thalamus as well as right cerebellar hemisphere. There is mild cerebral atrophy. Vascular: Major intracranial vascular flow voids are preserved. Skull and upper cervical spine: Unremarkable bone marrow signal para Sinuses/Orbits: Unremarkable orbits. Paranasal sinuses and mastoid air cells are clear. Other: None. IMPRESSION: 1. Small acute left caudate infarct. 2. Mildly age advanced chronic small vessel ischemic disease. Electronically Signed   By: Sebastian Ache M.D.   On: 10/02/2023 12:56    Micro Results    No results found for this or any previous visit (from the past 240 hours).  Today   Subjective    Estelle June today has no headache,no chest abdominal pain,no new weakness tingling or numbness, feels much better wants to go home today.    Objective   Blood pressure 111/61, pulse (!) 58, temperature 98.5 F (36.9 C), temperature source Oral, resp. rate 17, height 5\' 9"  (1.753 m), weight 80.7 kg, SpO2 93%.  No intake or output data in the 24 hours ending 10/04/23 1057  Exam  Awake Alert, No new F.N deficits,    [place].AT,PERRAL Supple Neck,   Symmetrical Chest wall movement, Good air movement bilaterally, CTAB RRR,No Gallops,   +ve B.Sounds, Abd Soft, Non tender,  No Cyanosis, Clubbing or edema    Data Review   Recent Labs  Lab 10/02/23 1015  10/03/23 0514 10/04/23 0523  WBC 8.6 8.8 7.4  HGB 13.4 12.9* 12.9*  HCT 42.3 39.6 39.0  PLT 309 302 304  MCV 92.4 90.0 89.2  MCH 29.3 29.3 29.5  MCHC 31.7 32.6 33.1  RDW 13.1 13.0 13.1  LYMPHSABS 1.6  --   --   MONOABS 0.7  --   --   EOSABS 0.1  --   --   BASOSABS 0.0  --   --     Recent Labs  Lab 10/02/23 1015 10/03/23 0514 10/04/23 0523  NA 138 135 137  K 5.0 4.0 3.9  CL 104 104 107  CO2 23 21* 22  ANIONGAP 11 10 8   GLUCOSE 90 98 82  BUN 16 14 14   CREATININE 1.41* 1.35* 1.34*  AST 25 20  --   ALT 14 15  --   ALKPHOS 67 66  --   BILITOT 0.7 0.8  --   ALBUMIN 3.9 3.5  --   INR 1.0  --   --   HGBA1C  --  5.5  --   CALCIUM 9.4 9.0 8.5*    Total Time in preparing paper work, data evaluation and todays exam - 35 minutes  Signature  -    Susa Raring M.D on 10/04/2023 at 10:57 AM   -  To page go to [email or web address]

## 2023-10-07 ENCOUNTER — Encounter (HOSPITAL_COMMUNITY): Payer: Self-pay | Admitting: Emergency Medicine

## 2023-10-07 ENCOUNTER — Other Ambulatory Visit: Payer: Self-pay | Admitting: Family Medicine

## 2023-10-07 ENCOUNTER — Other Ambulatory Visit: Payer: Self-pay

## 2023-10-07 NOTE — Progress Notes (Addendum)
 SDW CALL  Patient was given pre-op instructions over the phone. The opportunity was given for the patient to ask questions. No further questions asked. Patient verbalized understanding of instructions given.   PCP - Dorie Rank Cook,DO Cardiologist - denies  PPM/ICD - denies Device Orders -  Rep Notified -   Chest x-ray - CT-#/26/25 EKG - 10/02/23 Stress Test - denies ECHO - 10/03/23 Cardiac Cath - denies  Sleep Study - denies CPAP -   Fasting Blood Sugar - na Checks Blood Sugar _____ times a day  Blood Thinner Instructions:na Aspirin Instruction:hold am of procedure  ERAS Protcol -no PRE-SURGERY Ensure or G2-   COVID TEST- na   Anesthesia review: yes - Pt hospitalized for CVA 10/02/23- 10/04/23.   Patient denies shortness of breath, fever, cough and chest pain over the phone call    Surgical Instructions    Your procedure is scheduled on April 1  Report to [facility] Main Entrance "A" at 0530 A.M., then check in with the Admitting office.  Call this number if you have problems the morning of surgery:  [phone]    Remember:  Do not eat or drink anything after midnight the night before your surgery   Take these medicines the morning of surgery with A SIP OF WATER: Trelegy Ellipta,Tylenol if needed.  As of today, STOP taking any Aleve, Naproxen, Ibuprofen, Motrin, Advil, Goody's, BC's, all herbal medications, fish oil, and all vitamins.  DO NOT TAKE JARDIANCE 3/31. Pt reports he takes at bedtime.    [facility] is not responsible for any belongings or valuables. .   Do NOT Smoke (Tobacco/Vaping)  24 hours prior to your procedure  If you use a CPAP at night, you may bring your mask for your overnight stay.   Contacts, glasses, hearing aids, dentures or partials may not be worn into surgery, please bring cases for these belongings   Patients discharged the day of surgery will not be allowed to drive home, and someone needs to stay with them for 24  hours.     Special instructions:    Oral Hygiene is also important to reduce your risk of infection.  Remember - BRUSH YOUR TEETH THE MORNING OF SURGERY WITH YOUR REGULAR TOOTHPASTE   Day of Surgery:  Take a shower the day of or night before with antibacterial soap. Wear Clean/Comfortable clothing the morning of surgery Do not apply any deodorants/lotions.   Do not wear jewelry or makeup Do not wear lotions, powders, perfumes/colognes, or deodorant. Do not shave 48 hours prior to surgery.  Men may shave face and neck. Do not bring valuables to the hospital. Do not wear nail polish, gel polish, artificial nails, or any other type of covering on natural nails (fingers and toes) If you have artificial nails or gel coating that need to be removed by a nail salon, please have this removed prior to surgery. Artificial nails or gel coating may interfere with anesthesia's ability to adequately monitor your vital signs. Remember to brush your teeth WITH YOUR REGULAR TOOTHPASTE.

## 2023-10-07 NOTE — Progress Notes (Signed)
 Anesthesia Chart Review: Michael Brady  Case: [id number] Date/Time: 10/08/23 0730   Procedure: VIDEO BRONCHOSCOPY WITH ENDOBRONCHIAL NAVIGATION   Anesthesia type: General   Pre-op diagnosis: lung mass   [facility]   Surgeons: Leslye Peer, MD       DISCUSSION: Patient is a 68 year old male scheduled for the above procedure.   History includes smoking, post-operative N/V, left renal cancer (s/p XI Robot assisted laparoscopic left nephroureterectomy 12/11/17), bladder tumor (s/p TURBT 07/25/18), perforated gastroduodenal ulcer (s/p exploratory laparotomy with gastrorrhaphy and Cheree Ditto patch repair, repair of incarcerated incisional hernia 08/26/21 & treatment for H. Pylori), pancreatitis (09/2021, likely related to clarithromycin), RUL lung mass (10/02/23).   Notes suggest that his PCP started him on SGLT2 (Jardiance) on 06/28/23 for elevated microalbumin and CKD (stage 3A by most recent renal lab trends). Creatinine ~ 1.3-1.40 range since June 2024 with eGFR ~ 55-58. Last Jardiance 10/06/23.   [facility] admission 10/02/23 - 10/04/23 for loss of vision of his left eye on 10/01/23. Vision with some improvement, but evaluated by his ophthalmologist on 10/02/23 and sent to the ED for concern for retinal artery embolism. MRI brain done consistent with acute CVA, and neurology consulted. CTAhead and neck showed no LVO, but did show widespread atherosclerosis and moderate to severe stenosis in multiple vessels including severe changes at the right PCA. He had an incidental finding of RUL lung mass, most concerning for cancer. Possible bladder masses also noted. Pulmonologist Dr. Judeth Horn consulted. Referral for lung biopsy versus straight to CT surgery discussed. He opted for biopsy first. Plan was discussed with neurologist Dr. Pearlean Brownie, and "If the pulmonary and bladder biopsies are done within 3 weeks of stroke then Plavix can be commenced. If the biopsies are done beyond 3  weeks of acute stroke then continue aspirin only." He recommended continue ASA if okay with pulmonary team. Patient was discharged home on ASA, continue home statin and ARB. Smoking cessation encouraged. 10/03/23 echo showed normal LVEF, no cardiac etiology of CVA.   He had labs and EKG during his recent admission. Anesthesia team to evaluate on the day of surgery.    VS:  BP Readings from Last 3 Encounters:  10/04/23 111/61  06/26/23 131/79  12/25/22 138/82   Pulse Readings from Last 3 Encounters:  10/04/23 (!) 58  06/26/23 (!) 59  12/25/22 64     PROVIDERS: [name], DO is PCP    LABS: Most recent lab results in [facility] include: Lab Results  Component Value Date   WBC 7.4 10/04/2023   HGB 12.9 (L) 10/04/2023   HCT 39.0 10/04/2023   PLT 304 10/04/2023   GLUCOSE 82 10/04/2023   CHOL 114 10/03/2023   TRIG 144 10/03/2023   HDL 33 (L) 10/03/2023   LDLCALC 52 10/03/2023   ALT 15 10/03/2023   AST 20 10/03/2023   NA 137 10/04/2023   K 3.9 10/04/2023   CL 107 10/04/2023   CREATININE 1.34 (H) 10/04/2023   BUN 14 10/04/2023   CO2 22 10/04/2023   INR 1.0 10/02/2023   HGBA1C 5.5 10/03/2023    IMAGES: CT Chest/abd/pelvis 10/02/23: IMPRESSION: 1. Spiculated nodule in the right upper lobe measuring up to 2.0 cm worrisome for malignancy. 2. Right lower lobe peribronchial thickening with cluster of tree-in-bud opacities in the right lower lobe with associated nodular densities measuring up to 6 mm. Findings are favored as infectious/inflammatory. Metastatic nodules in the right lower lobe are  not excluded given above findings. 3. Severe emphysema. 4. Two small filling defects along the superior bladder measuring up to 1 cm may represent bladder wall masses. Recommend further evaluation with cystoscopy. 5. Diffuse wall thickening of the sigmoid colon without surrounding inflammation. Findings may be related to colitis or diverticulitis. 6. Scattered air-fluid levels  throughout the colon compatible with diarrheal illness. 7. 3.2 cm infrarenal abdominal aortic aneurysm. Recommend follow-up ultrasound every 3 years. 8. Two new small to moderate-sized fat containing hernias above the level of the umbilicus. - Aortic Atherosclerosis (ICD10-I70.0) and Emphysema (ICD10-J43.9).   CT Head & CTA Head/Neck 10/02/23: IMPRESSION: 1. Right upper lobe Spiculated Lung Mass superimposed on Emphysema (ICD10-J43.9) is nearly 3 cm and considered Bronchogenic Carcinoma until proven otherwise. PET-CT and referral to [facility]) would be most valuable. 2. CTA is Negative for large vessel occlusion, positive for widespread atherosclerosis, and positive for widespread intracranial arterial dolichoectasia. - Moderate stenosis at both the origin of the Dominant Left Vertebral Artery and at its Vertebrobasilar junction. Right vertebral terminates in PICA. - Moderate stenosis of the Right M1 segment. Mild left MCA origin stenosis. - Moderate to Severe stenosis of the Right PCA origin. And up to stenoses of bilateral P1 and P2 segments. 3.  No new intracranial abnormality since the MRI this morning. 4. Aortic Atherosclerosis (ICD10-I70.0).   MRI Brain 10/02/23: IMPRESSION: 1. Small acute left caudate infarct. 2. Mildly age advanced chronic small vessel ischemic disease.   EKG: 10/02/23: Sinus rhythm at 63 bpm Baseline wander Confirmed by Gerhard Munch [phone]) on 10/02/2023 9:56:44 AM   CV: Echo 10/03/23: IMPRESSIONS   1. Left ventricular ejection fraction, by estimation, is 60 to 65%. The  left ventricle has normal function. The left ventricle has no regional  wall motion abnormalities. There is mild left ventricular hypertrophy.  Left ventricular diastolic parameters  were grossly normal.   2. Right ventricular systolic function is normal. The right ventricular  size is mildly enlarged. Tricuspid regurgitation signal is  inadequate for  assessing PA pressure.   3. The mitral valve is grossly normal. Trivial mitral valve  regurgitation. No evidence of mitral stenosis.   4. The aortic valve is tricuspid. Aortic valve regurgitation is not  visualized. No aortic stenosis is present.   5. The inferior vena cava is normal in size with greater than 50%  respiratory variability, suggesting right atrial pressure of 3 mmHg.  - Conclusion(s)/Recommendation(s): No intracardiac source of embolism  detected on this transthoracic study. Consider a transesophageal  echocardiogram to exclude cardiac source of embolism if clinically  indicated.    Past Medical History:  Diagnosis Date   Cancer (HCC)    left renal pelvis cnacer-12/05/2017   Diverticulosis of colon    NO ISSUES IN OVER 12 YEARS    Dysuria    Hematuria    10-11-17: REPORTS I "NOTICED SOME BLOOD IN MY URINE TODAY AFTER NOT SEEING ANY SINCE MY SURGERY IN [place] . I JUST HAD MY BLOOD DRAWN AT [facility] THIS LAST MONDAY BUT THEY HAVENT CALLED ME ABOUT IT YET"   History of closed dislocation of shoulder 06/2014   LEFT --- POST IV SEDATION CLOSED REDUCTION IN ED   PONV (postoperative nausea and vomiting)    Renal mass, left    LEFT RENAL COLLECTING SYSTEM MASS   Smokers' cough (HCC)    OCC   Wears dentures    Wears glasses     Past Surgical History:  Procedure Laterality Date  CARPAL TUNNEL RELEASE Left 01-25-2006   dr Ophelia Charter  [facility]   and LEFT THUMB PULLEY RELEASE   COLONOSCOPY N/A 05/07/2017   Procedure: COLONOSCOPY;  Surgeon: Franky Macho, MD;  [facility];  Service: Gastroenterology;  Laterality: N/A;   CYSTOSCOPY W/ RETROGRADES Right 07/25/2018   Procedure: CYSTOSCOPY WITH RETROGRADE PYELOGRAM;  Surgeon: Sebastian Ache, MD;  Location: [facility];  Service: Urology;  Laterality: Right;   CYSTOSCOPY WITH URETEROSCOPY AND STENT PLACEMENT Left 07/29/2017   Procedure: CYSTOSCOPY WITH URETEROSCOPY AND STENT PLACEMENT;  Surgeon:  Hildred Laser, MD;  Location: [facility];  Service: Urology;  Laterality: Left;   CYSTOSCOPY/URETEROSCOPY/HOLMIUM LASER/STENT PLACEMENT Left 07/15/2017   Procedure: CYSTOSCOPY, ATTEMTED URETEROSCOPY/,STENT PLACEMENT;  Surgeon: Hildred Laser, MD;  Location: [facility];  Service: Urology;  Laterality: Left;   CYSTOSCOPY/URETEROSCOPY/HOLMIUM LASER/STENT PLACEMENT Left 10/21/2017   Procedure: CYSTOSCOPY, RETROGRADE /URETEROSCOPY LEFT UPPER TRACT,BIOPSY, Manning Charity LASER/STENT PLACEMENT;  Surgeon: Hildred Laser, MD;  Location[facility];  Service: Urology;  Laterality: Left;   GASTRORRHAPHY N/A 08/26/2021   Procedure: GASTRORRHAPHY;  Surgeon: Lewie Chamber, DO;  Location: [facility];  Service: General;  Laterality: N/A;   HOLMIUM LASER APPLICATION Left 10/21/2017   Procedure: HOLMIUM LASER APPLICATION;  Surgeon: Hildred Laser, MD;  Location: [facility];  Service: Urology;  Laterality: Left;   INCISIONAL HERNIA REPAIR Right 08/26/2021   Procedure: HERNIA REPAIR INCISIONAL;  Surgeon: Lewie Chamber, DO;  Location: [facility];  Service: General;  Laterality: Right;   LAPAROTOMY N/A 08/26/2021   Procedure: EXPLORATORY LAPAROTOMY;  Surgeon: Lewie Chamber, DO;  Location: [facility];  Service: General;  Laterality: N/A;   ROBOT ASSITED LAPAROSCOPIC NEPHROURETERECTOMY Left 12/11/2017   Procedure: XI ROBOT ASSITED LAPAROSCOPIC NEPHROURETERECTOMY;  Surgeon: Sebastian Ache, MD;  Location: [facility];  Service: Urology;  Laterality: Left;   SHOULDER SURGERY Right 1997   ROTATOR CUFF REPAIR    THULIUM LASER TURP (TRANSURETHRAL RESECTION OF PROSTATE) Left 07/29/2017   Procedure: CYSTOSCOPY, LEFT URETEROSCOPY WITH TUMOR BIOPSYTHULLIUM LASER ABLATION OF TUMOR AND LEFT URETERAL STENT EXCHANGE;  Surgeon: Hildred Laser, MD;  Location: [facility];  Service: Urology;  Laterality: Left;   TRANSURETHRAL RESECTION  OF BLADDER TUMOR N/A 07/25/2018   Procedure: TRANSURETHRAL RESECTION OF BLADDER TUMOR (TURBT);  Surgeon: Sebastian Ache, MD;  Location: [facility];  Service: Urology;  Laterality: N/A;   WISDOM TOOTH EXTRACTION      MEDICATIONS: No current facility-administered medications for this encounter.    acetaminophen (TYLENOL) 500 MG tablet   aspirin EC 81 MG tablet   citalopram (CELEXA) 20 MG tablet   empagliflozin (JARDIANCE) 10 MG TABS tablet   losartan (COZAAR) 25 MG tablet   nicotine (NICODERM CQ - DOSED IN MG/24 HOURS) 21 mg/24hr patch   rosuvastatin (CRESTOR) 20 MG tablet   TRELEGY ELLIPTA 100-62.5-25 MCG/ACT AEPB    Shonna Chock, PA-C Surgical Short Stay/[facility] Phone [phone] [facility] Phone [phone] 10/07/2023 12:24 PM

## 2023-10-07 NOTE — Anesthesia Preprocedure Evaluation (Signed)
 Anesthesia Evaluation  Patient identified by MRN, date of birth, ID band Patient awake    Reviewed: Allergy & Precautions, H&P , NPO status , Patient's Chart, lab work & pertinent test results, reviewed documented beta blocker date and time   History of Anesthesia Complications (+) PONV and history of anesthetic complications  Airway Mallampati: II  TM Distance: >3 FB Neck ROM: full    Dental no notable dental hx.    Pulmonary COPD, Current Smoker and Patient abstained from smoking.   Pulmonary exam normal breath sounds clear to auscultation       Cardiovascular Exercise Tolerance: Good hypertension,  Rhythm:regular Rate:Normal     Neuro/Psych  PSYCHIATRIC DISORDERS  Depression    CVA    GI/Hepatic negative GI ROS, Neg liver ROS,,,  Endo/Other  negative endocrine ROS    Renal/GU CRFRenal disease  negative genitourinary   Musculoskeletal   Abdominal   Peds  Hematology negative hematology ROS (+)   Anesthesia Other Findings Lung Mass  Reproductive/Obstetrics negative OB ROS                             Anesthesia Physical Anesthesia Plan  ASA: 4  Anesthesia Plan: General and General ETT   Post-op Pain Management: Minimal or no pain anticipated   Induction: Intravenous  PONV Risk Score and Plan: 3 and Ondansetron, Dexamethasone, Midazolam and Treatment may vary due to age or medical condition  Airway Management Planned: Oral ETT  Additional Equipment:   Intra-op Plan:   Post-operative Plan: Extubation in OR  Informed Consent: I have reviewed the patients History and Physical, chart, labs and discussed the procedure including the risks, benefits and alternatives for the proposed anesthesia with the patient or authorized representative who has indicated his/her understanding and acceptance.     Dental Advisory Given  Plan Discussed with: CRNA  Anesthesia Plan Comments: (PAT  note written 10/07/2023 by Shonna Chock, PA-C.  )        Anesthesia Quick Evaluation

## 2023-10-08 ENCOUNTER — Ambulatory Visit (HOSPITAL_COMMUNITY)

## 2023-10-08 ENCOUNTER — Ambulatory Visit (HOSPITAL_COMMUNITY)
Admission: RE | Admit: 2023-10-08 | Discharge: 2023-10-08 | Disposition: A | Discharge status: Home / Self Care | Attending: Emergency Medicine | Admitting: Emergency Medicine

## 2023-10-08 ENCOUNTER — Encounter (HOSPITAL_COMMUNITY)
Admission: RE | Disposition: A | Discharge status: Home / Self Care | Payer: Self-pay | Source: Home / Self Care | Attending: Emergency Medicine

## 2023-10-08 ENCOUNTER — Encounter (HOSPITAL_COMMUNITY): Payer: Self-pay | Admitting: Emergency Medicine

## 2023-10-08 ENCOUNTER — Ambulatory Visit (HOSPITAL_BASED_OUTPATIENT_CLINIC_OR_DEPARTMENT_OTHER): Payer: Self-pay | Admitting: Vascular Surgery

## 2023-10-08 ENCOUNTER — Ambulatory Visit (HOSPITAL_COMMUNITY): Payer: Self-pay | Admitting: Vascular Surgery

## 2023-10-08 DIAGNOSIS — I10 Essential (primary) hypertension: Secondary | ICD-10-CM | POA: Diagnosis not present

## 2023-10-08 DIAGNOSIS — F17210 Nicotine dependence, cigarettes, uncomplicated: Secondary | ICD-10-CM | POA: Diagnosis not present

## 2023-10-08 DIAGNOSIS — R918 Other nonspecific abnormal finding of lung field: Secondary | ICD-10-CM | POA: Diagnosis not present

## 2023-10-08 DIAGNOSIS — J449 Chronic obstructive pulmonary disease, unspecified: Secondary | ICD-10-CM | POA: Diagnosis not present

## 2023-10-08 DIAGNOSIS — R911 Solitary pulmonary nodule: Secondary | ICD-10-CM | POA: Diagnosis not present

## 2023-10-08 DIAGNOSIS — J439 Emphysema, unspecified: Secondary | ICD-10-CM | POA: Diagnosis not present

## 2023-10-08 DIAGNOSIS — F321 Major depressive disorder, single episode, moderate: Secondary | ICD-10-CM

## 2023-10-08 DIAGNOSIS — Z48813 Encounter for surgical aftercare following surgery on the respiratory system: Secondary | ICD-10-CM | POA: Diagnosis not present

## 2023-10-08 HISTORY — PX: BRONCHIAL BRUSHINGS: SHX5108

## 2023-10-08 HISTORY — PX: FUDUCIAL PLACEMENT: SHX5083

## 2023-10-08 HISTORY — PX: VIDEO BRONCHOSCOPY WITH ENDOBRONCHIAL NAVIGATION: SHX6175

## 2023-10-08 HISTORY — PX: BRONCHIAL BIOPSY: SHX5109

## 2023-10-08 HISTORY — PX: BRONCHIAL NEEDLE ASPIRATION BIOPSY: SHX5106

## 2023-10-08 SURGERY — VIDEO BRONCHOSCOPY WITH ENDOBRONCHIAL NAVIGATION
Anesthesia: General

## 2023-10-08 MED ORDER — CHLORHEXIDINE GLUCONATE 0.12 % MT SOLN: 15.0000 mL | Freq: Once | OROMUCOSAL | Status: AC

## 2023-10-08 MED ORDER — CITALOPRAM HYDROBROMIDE 20 MG PO TABS: 20.0000 mg | ORAL_TABLET | Freq: Every day | ORAL | Status: DC

## 2023-10-08 MED ORDER — OXYCODONE HCL 5 MG PO TABS: 5.0000 mg | ORAL_TABLET | Freq: Once | ORAL | Status: DC | PRN

## 2023-10-08 MED ORDER — PHENYLEPHRINE 80 MCG/ML (10ML) SYRINGE FOR IV PUSH (FOR BLOOD PRESSURE SUPPORT)
PREFILLED_SYRINGE | INTRAVENOUS | Status: DC | PRN
  Administered 2023-10-08: 80 ug via INTRAVENOUS

## 2023-10-08 MED ORDER — EMPAGLIFLOZIN 10 MG PO TABS: 10.0000 mg | ORAL_TABLET | Freq: Every day | ORAL | Status: DC

## 2023-10-08 MED ORDER — DEXAMETHASONE SODIUM PHOSPHATE 10 MG/ML IJ SOLN
INTRAMUSCULAR | Status: DC | PRN
  Administered 2023-10-08: 5 mg via INTRAVENOUS

## 2023-10-08 MED ORDER — SUCCINYLCHOLINE CHLORIDE 200 MG/10ML IV SOSY
PREFILLED_SYRINGE | INTRAVENOUS | Status: DC | PRN
  Administered 2023-10-08: 120 mg via INTRAVENOUS

## 2023-10-08 MED ORDER — PROPOFOL 10 MG/ML IV BOLUS
INTRAVENOUS | Status: DC | PRN
  Administered 2023-10-08: 150 mg via INTRAVENOUS

## 2023-10-08 MED ORDER — FENTANYL CITRATE (PF) 250 MCG/5ML IJ SOLN
INTRAMUSCULAR | Status: DC | PRN
  Administered 2023-10-08: 100 ug via INTRAVENOUS

## 2023-10-08 MED ORDER — LIDOCAINE 2% (20 MG/ML) 5 ML SYRINGE
INTRAMUSCULAR | Status: DC | PRN
  Administered 2023-10-08: 80 mg via INTRAVENOUS

## 2023-10-08 MED ORDER — FENTANYL CITRATE (PF) 100 MCG/2ML IJ SOLN
INTRAMUSCULAR | Status: AC
  Filled 2023-10-08: qty 2

## 2023-10-08 MED ORDER — PROPOFOL 500 MG/50ML IV EMUL
INTRAVENOUS | Status: DC | PRN
  Administered 2023-10-08: 150 ug/kg/min via INTRAVENOUS

## 2023-10-08 MED ORDER — CHLORHEXIDINE GLUCONATE 0.12 % MT SOLN
OROMUCOSAL | Status: AC
  Administered 2023-10-08: 15 mL via OROMUCOSAL
  Filled 2023-10-08: qty 15

## 2023-10-08 MED ORDER — HYDROMORPHONE HCL 1 MG/ML IJ SOLN: 0.2500 mg | INTRAMUSCULAR | Status: DC | PRN

## 2023-10-08 MED ORDER — ROCURONIUM BROMIDE 10 MG/ML (PF) SYRINGE
PREFILLED_SYRINGE | INTRAVENOUS | Status: DC | PRN
  Administered 2023-10-08 (×2): 10 mg via INTRAVENOUS
  Administered 2023-10-08: 30 mg via INTRAVENOUS

## 2023-10-08 MED ORDER — ONDANSETRON HCL 4 MG/2ML IJ SOLN
INTRAMUSCULAR | Status: DC | PRN
  Administered 2023-10-08: 4 mg via INTRAVENOUS

## 2023-10-08 MED ORDER — OXYCODONE HCL 5 MG/5ML PO SOLN: 5.0000 mg | Freq: Once | ORAL | Status: DC | PRN

## 2023-10-08 MED ORDER — SODIUM CHLORIDE 0.9 % IV SOLN: 12.5000 mg | INTRAVENOUS | Status: DC | PRN

## 2023-10-08 MED ORDER — LACTATED RINGERS IV SOLN: INTRAVENOUS | Status: DC

## 2023-10-08 MED ORDER — SUGAMMADEX SODIUM 200 MG/2ML IV SOLN
INTRAVENOUS | Status: DC | PRN
  Administered 2023-10-08: 200 mg via INTRAVENOUS

## 2023-10-08 MED ORDER — AMISULPRIDE (ANTIEMETIC) 5 MG/2ML IV SOLN: 10.0000 mg | Freq: Once | INTRAVENOUS | Status: DC | PRN

## 2023-10-08 SURGICAL SUPPLY — 1 items: superlock fiducial marker IMPLANT

## 2023-10-08 NOTE — Anesthesia Postprocedure Evaluation (Signed)
 Anesthesia Post Note  Patient: Michael Brady  Procedure(s) Performed: VIDEO BRONCHOSCOPY WITH ENDOBRONCHIAL NAVIGATION BRONCHOSCOPY, WITH BIOPSY BRONCHOSCOPY, WITH BRUSH BIOPSY BRONCHOSCOPY, WITH NEEDLE ASPIRATION BIOPSY INSERTION, FIDUCIAL MARKER, GOLD     Patient location during evaluation: PACU Anesthesia Type: General Level of consciousness: awake and alert Pain management: pain level controlled Vital Signs Assessment: post-procedure vital signs reviewed and stable Respiratory status: spontaneous breathing, nonlabored ventilation and respiratory function stable Cardiovascular status: blood pressure returned to baseline and stable Postop Assessment: no apparent nausea or vomiting Anesthetic complications: no   There were no known notable events for this encounter.  Last Vitals:  Vitals:   10/08/23 0900 10/08/23 0915  BP: 116/73 113/73  Pulse: 66 65  Resp: 17 18  Temp:  37.1 C  SpO2: 93% 95%    Last Pain:  Vitals:   10/08/23 0841  TempSrc:   PainSc: Asleep                 Lowella Curb

## 2023-10-08 NOTE — Op Note (Signed)
 Video Bronchoscopy with Robotic Assisted Bronchoscopic Navigation   Date of Operation: 10/08/2023   Pre-op Diagnosis: Right upper lobe nodule  Post-op Diagnosis: Same  Surgeon: Levy Pupa  Assistants: None  Anesthesia: General endotracheal anesthesia  Operation: Flexible video fiberoptic bronchoscopy with robotic assistance and biopsies.  Estimated Blood Loss: Minimal  Complications: None  Indications and History: Michael Brady is a 68 y.o. male with history of tobacco use.  Found to have a spiculated right upper lobe pulmonary nodule on CT scan of the chest.  Recommendation made to achieve a tissue diagnosis via robotic assisted navigational bronchoscopy. The risks, benefits, complications, treatment options and expected outcomes were discussed with the patient.  The possibilities of pneumothorax, pneumonia, reaction to medication, pulmonary aspiration, perforation of a viscus, bleeding, failure to diagnose a condition and creating a complication requiring transfusion or operation were discussed with the patient who freely signed the consent.    Description of Procedure: The patient was seen in the Preoperative Area, was examined and was deemed appropriate to proceed.  The patient was taken to [facility] endoscopy room 3, identified as Michael Brady and the procedure verified as Flexible Video Fiberoptic Bronchoscopy.  A Time Out was held and the above information confirmed.   Prior to the date of the procedure a high-resolution CT scan of the chest was performed. Utilizing ION software program a virtual tracheobronchial tree was generated to allow the creation of distinct navigation pathways to the patient's parenchymal abnormalities. After being taken to the operating room general anesthesia was initiated and the patient  was orally intubated. The video fiberoptic bronchoscope was introduced via the endotracheal tube and a general inspection was performed which showed normal right and left lung  anatomy. Aspiration of the bilateral mainstems was completed to remove any remaining secretions. Robotic catheter inserted into patient's endotracheal tube.   Target #1 right upper lobe nodule: The distinct navigation pathways prepared prior to this procedure were then utilized to navigate to patient's lesion identified on CT scan. The robotic catheter was secured into place and the vision probe was withdrawn.  Lesion location was approximated using fluoroscopy.  Local registration and targeting was performed using Cios three-dimensional imaging. Under fluoroscopic guidance needle brushings, transbronchial needle biopsies, and transbronchial forceps biopsies were performed to be sent for cytology and pathology.  Needle in lesion was confirmed on Cios.  Under fluoroscopic guidance a single fiducial marker was placed adjacent to the nodule.  At the end of the procedure a general airway inspection was performed and there was no evidence of active bleeding. The bronchoscope was removed.  The patient tolerated the procedure well. There was no significant blood loss and there were no obvious complications. A post-procedural chest x-ray is pending.  Samples Target #1: 1. Transbronchial brushings from right upper lobe nodule 2. Transbronchial Wang needle biopsies from right upper lobe nodule 3. Transbronchial forceps biopsies from right upper lobe nodule    Plans:  The patient will be discharged from the PACU to home when recovered from anesthesia and after chest x-ray is reviewed. We will review the cytology, pathology and microbiology results with the patient when they become available. Outpatient followup will be with Dr. Judeth Horn.    Levy Pupa, MD, PhD 10/08/2023, 8:43 AM [facility] [phone] or if no answer before 7:00PM call [phone] For any issues after 7:00PM please call eLink [phone]

## 2023-10-08 NOTE — Interval H&P Note (Signed)
 History and Physical Interval Note:  10/08/2023 7:17 AM  Michael Brady  has presented today for surgery, with the diagnosis of lung mass.  The various methods of treatment have been discussed with the patient and family. After consideration of risks, benefits and other options for treatment, the patient has consented to  Procedure(s): VIDEO BRONCHOSCOPY WITH ENDOBRONCHIAL NAVIGATION (N/A) as a surgical intervention.  The patient's history has been reviewed, patient examined, no change in status, stable for surgery.  I have reviewed the patient's chart and labs.  Questions were answered to the patient's satisfaction.     Leslye Peer

## 2023-10-08 NOTE — Discharge Instructions (Addendum)
 Flexible Bronchoscopy, Care After This sheet gives you information about how to care for yourself after your test. Your doctor may also give you more specific instructions. If you have problems or questions, contact your doctor. Follow these instructions at home: Eating and drinking When your numbness is gone and your cough and gag reflexes have come back, you may: Eat only soft foods. Slowly drink liquids. When you get home after the test, go back to your normal diet. Driving Do not drive for 24 hours if you were given a medicine to help you relax (sedative). Do not drive or use heavy machinery while taking prescription pain medicine. General instructions  Take over-the-counter and prescription medicines only as told by your doctor. Return to your normal activities as told. Ask what activities are safe for you. Do not use any products that have nicotine or tobacco in them. This includes cigarettes and e-cigarettes. If you need help quitting, ask your doctor. Keep all follow-up visits as told by your doctor. This is important. It is very important if you had a tissue sample (biopsy) taken. Get help right away if: You have shortness of breath that gets worse. You get light-headed. You feel like you are going to pass out (faint). You have chest pain. You cough up: More than a little blood. More blood than before. Summary Do not eat or drink anything (not even water) for 2 hours after your test, or until your numbing medicine wears off. Do not use cigarettes. Do not use e-cigarettes. Get help right away if you have chest pain.  Please call our office for any questions or concerns.  [phone]. Okay to get back on your aspirin on 10/09/2023  This information is not intended to replace advice given to you by your health care provider. Make sure you discuss any questions you have with your health care provider. Document Released: 04/22/2009 Document Revised: 06/07/2017 Document Reviewed:  07/13/2016 Elsevier Patient Education  2020 [employer].

## 2023-10-08 NOTE — Transfer of Care (Signed)
 Immediate Anesthesia Transfer of Care Note  Patient: Michael Brady  Procedure(s) Performed: VIDEO BRONCHOSCOPY WITH ENDOBRONCHIAL NAVIGATION BRONCHOSCOPY, WITH BIOPSY BRONCHOSCOPY, WITH BRUSH BIOPSY BRONCHOSCOPY, WITH NEEDLE ASPIRATION BIOPSY INSERTION, FIDUCIAL MARKER, GOLD  Patient Location: PACU  Anesthesia Type:General  Level of Consciousness: awake, alert , oriented, and patient cooperative  Airway & Oxygen Therapy: Patient Spontanous Breathing and Patient connected to face mask oxygen  Post-op Assessment: Report given to RN, Post -op Vital signs reviewed and stable, and Patient moving all extremities  Post vital signs: Reviewed and stable  Last Vitals:  Vitals Value Taken Time  BP 130/74 10/08/23 0841  Temp 37.1 C 10/08/23 0841  Pulse 68 10/08/23 0845  Resp 20 10/08/23 0845  SpO2 98 % 10/08/23 0845  Vitals shown include unfiled device data.  Last Pain:  Vitals:   10/08/23 0841  TempSrc:   PainSc: Asleep         Complications: No notable events documented.

## 2023-10-08 NOTE — Anesthesia Procedure Notes (Signed)
 Procedure Name: Intubation Date/Time: 10/08/2023 7:33 AM  Performed by: Alwyn Ren, CRNAPre-anesthesia Checklist: Patient identified, Emergency Drugs available, Suction available and Patient being monitored Patient Re-evaluated:Patient Re-evaluated prior to induction Oxygen Delivery Method: Circle system utilized Preoxygenation: Pre-oxygenation with 100% oxygen Induction Type: IV induction Ventilation: Mask ventilation without difficulty Laryngoscope Size: Miller and 3 Grade View: Grade I Tube type: Oral Tube size: 8.5 mm Number of attempts: 1 Airway Equipment and Method: Stylet and Oral airway Placement Confirmation: ETT inserted through vocal cords under direct vision, positive ETCO2 and breath sounds checked- equal and bilateral Secured at: 22 cm Tube secured with: Tape Dental Injury: Teeth and Oropharynx as per pre-operative assessment  Comments: Airway by Sherron Flemings

## 2023-10-09 LAB — CYTOLOGY - NON PAP

## 2023-10-11 ENCOUNTER — Encounter: Payer: Self-pay | Admitting: Family Medicine

## 2023-10-11 ENCOUNTER — Ambulatory Visit: Admitting: Family Medicine

## 2023-10-11 VITALS — BP 124/70 | HR 63 | Temp 97.5°F | Ht 69.0 in | Wt 174.0 lb

## 2023-10-11 DIAGNOSIS — F321 Major depressive disorder, single episode, moderate: Secondary | ICD-10-CM | POA: Diagnosis not present

## 2023-10-11 DIAGNOSIS — I6930 Unspecified sequelae of cerebral infarction: Secondary | ICD-10-CM | POA: Diagnosis not present

## 2023-10-11 DIAGNOSIS — I25118 Atherosclerotic heart disease of native coronary artery with other forms of angina pectoris: Secondary | ICD-10-CM

## 2023-10-11 DIAGNOSIS — E785 Hyperlipidemia, unspecified: Secondary | ICD-10-CM

## 2023-10-11 DIAGNOSIS — N1831 Chronic kidney disease, stage 3a: Secondary | ICD-10-CM

## 2023-10-11 DIAGNOSIS — N183 Chronic kidney disease, stage 3 unspecified: Secondary | ICD-10-CM | POA: Insufficient documentation

## 2023-10-11 DIAGNOSIS — I10 Essential (primary) hypertension: Secondary | ICD-10-CM | POA: Diagnosis not present

## 2023-10-11 DIAGNOSIS — I7143 Infrarenal abdominal aortic aneurysm, without rupture: Secondary | ICD-10-CM | POA: Insufficient documentation

## 2023-10-11 DIAGNOSIS — I2510 Atherosclerotic heart disease of native coronary artery without angina pectoris: Secondary | ICD-10-CM | POA: Insufficient documentation

## 2023-10-11 DIAGNOSIS — R918 Other nonspecific abnormal finding of lung field: Secondary | ICD-10-CM | POA: Diagnosis not present

## 2023-10-11 DIAGNOSIS — I639 Cerebral infarction, unspecified: Secondary | ICD-10-CM

## 2023-10-11 NOTE — Patient Instructions (Signed)
 Continue your medications.  I am reaching out to [facility]  Follow up in 3 months.

## 2023-10-14 MED ORDER — CITALOPRAM HYDROBROMIDE 20 MG PO TABS: 20.0000 mg | ORAL_TABLET | Freq: Every day | ORAL | 3 refills | Status: DC

## 2023-10-14 MED ORDER — LOSARTAN POTASSIUM 25 MG PO TABS: 25.0000 mg | ORAL_TABLET | Freq: Every day | ORAL | 3 refills | Status: DC

## 2023-10-14 MED ORDER — EMPAGLIFLOZIN 10 MG PO TABS: 10.0000 mg | ORAL_TABLET | Freq: Every day | ORAL | 3 refills | Status: DC

## 2023-10-14 NOTE — Assessment & Plan Note (Signed)
 Stable.  Continue current medications.

## 2023-10-14 NOTE — Assessment & Plan Note (Signed)
 So far sampling was negative.  I spoke with Dr. Delton Coombes, pulmonology.  He will need close follow-up with either repeat biopsy and/or serial imaging.

## 2023-10-14 NOTE — Assessment & Plan Note (Signed)
At goal on statin.  Continue.

## 2023-10-14 NOTE — Progress Notes (Signed)
 Subjective:  Patient ID: Michael Brady, male    DOB: 06/22/1956  Age: 68 y.o. MRN: [id number]  CC:   Chief Complaint  Patient presents with   Hospitalization Follow-up   Follow-up    Hospital follow up.    HPI:  68 year old male presents for hospital follow-up.  Patient recently admitted from 3/26 to 3/28.  Hospital course, labs, imaging reviewed. Summary: Patient presented with vision loss.  MRI was performed and revealed acute stroke.  CT angiogram of the head and neck showed a right upper lobe lung mass concerning for lung cancer.  There was significant atherosclerosis of the vessels in the brain.  Given CT findings of right upper lobe mass, CT chest abdomen pelvis was obtained and also revealed bladder wall masses.  Patient presents today for follow-up.  He states that he is doing well.  Blood pressure is well-controlled.  Patient is not currently smoking.  Advised continued cessation.  Has had recent bronchoscopy and a biopsy of the mass which was negative for malignancy.  Will need close follow-up with pulmonology.  Has an appointment on 5/15.  Needs to see urology as well.   Patient Active Problem List   Diagnosis Date Noted   CKD (chronic kidney disease) stage 3, GFR 30-59 ml/min (HCC) 10/11/2023   Infrarenal abdominal aortic aneurysm (AAA) without rupture (HCC) 10/11/2023   CAD (coronary artery disease) 10/11/2023   Mass of upper lobe of right lung 10/03/2023   Acute CVA (cerebrovascular accident) (HCC) 10/02/2023   Essential hypertension 06/26/2023   COPD (chronic obstructive pulmonary disease) (HCC) 12/25/2022   History of small bowel obstruction 10/19/2021   PUD (peptic ulcer disease) 10/19/2021   History of renal pelvis cancer 08/26/2021   Severe Diverticulosis 08/26/2021   Smoker 05/31/2020   Depression, major, single episode, moderate (HCC) 11/21/2017   Hyperlipidemia 04/03/2017    Social Hx   Social History   Socioeconomic History   Marital status: Married     Spouse name: Not on file   Number of children: Not on file   Years of education: Not on file   Highest education level: 12th grade  Occupational History   Not on file  Tobacco Use   Smoking status: Every Day    Current packs/day: 1.50    Average packs/day: 1.5 packs/day for 40.0 years (60.0 ttl pk-yrs)    Types: Cigarettes   Smokeless tobacco: Never  Vaping Use   Vaping status: Never Used  Substance and Sexual Activity   Alcohol use: No   Drug use: No   Sexual activity: Not on file  Other Topics Concern   Not on file  Social History Narrative   Not on file   Social Drivers of Health   Financial Resource Strain: Low Risk  (12/24/2022)   Overall Financial Resource Strain (CARDIA)    Difficulty of Paying Living Expenses: Not hard at all  Food Insecurity: No Food Insecurity (10/02/2023)   Hunger Vital Sign    Worried About Running Out of Food in the Last Year: Never true    Ran Out of Food in the Last Year: Never true  Transportation Needs: No Transportation Needs (10/02/2023)   PRAPARE - Administrator, Civil Service (Medical): No    Lack of Transportation (Non-Medical): No  Physical Activity: Insufficiently Active (12/24/2022)   Exercise Vital Sign    Days of Exercise per Week: 5 days    Minutes of Exercise per Session: 20 min  Stress: No Stress Concern  Present (12/24/2022)   [employer] of Occupational Health - Occupational Stress Questionnaire    Feeling of Stress : Not at all  Social Connections: Socially Integrated (10/02/2023)   Social Connection and Isolation Panel [NHANES]    Frequency of Communication with Friends and Family: More than three times a week    Frequency of Social Gatherings with Friends and Family: Three times a week    Attends Religious Services: More than 4 times per year    Active Member of Clubs or Organizations: Yes    Attends Engineer, structural: More than 4 times per year    Marital Status: Married    Review of  Systems Per HPI  Objective:  BP 124/70   Pulse 63   Temp (!) 97.5 F (36.4 C)   Ht 5\' 9"  (1.753 m)   Wt 174 lb (78.9 kg)   SpO2 98%   BMI 25.70 kg/m      10/11/2023    9:00 AM 10/08/2023    9:15 AM 10/08/2023    9:00 AM  BP/Weight  Systolic BP 124 113 116  Diastolic BP 70 73 73  Wt. (Lbs) 174    BMI 25.7 kg/m2      Physical Exam Vitals and nursing note reviewed.  Constitutional:      General: He is not in acute distress.    Appearance: Normal appearance.  HENT:     Head: Normocephalic and atraumatic.  Cardiovascular:     Rate and Rhythm: Normal rate and regular rhythm.  Pulmonary:     Effort: Pulmonary effort is normal.     Breath sounds: Normal breath sounds. No wheezing, rhonchi or rales.  Neurological:     Mental Status: He is alert.  Psychiatric:        Mood and Affect: Mood normal.        Behavior: Behavior normal.     Lab Results  Component Value Date   WBC 7.4 10/04/2023   HGB 12.9 (L) 10/04/2023   HCT 39.0 10/04/2023   PLT 304 10/04/2023   GLUCOSE 82 10/04/2023   CHOL 114 10/03/2023   TRIG 144 10/03/2023   HDL 33 (L) 10/03/2023   LDLCALC 52 10/03/2023   ALT 15 10/03/2023   AST 20 10/03/2023   NA 137 10/04/2023   K 3.9 10/04/2023   CL 107 10/04/2023   CREATININE 1.34 (H) 10/04/2023   BUN 14 10/04/2023   CO2 22 10/04/2023   INR 1.0 10/02/2023   HGBA1C 5.5 10/03/2023     Assessment & Plan:  Acute CVA (cerebrovascular accident) [facility]) Assessment & Plan: Doing well at this time.  Will continue aspirin.  Lipids are at goal.  Blood pressure well-controlled.  His main risk factor is tobacco abuse.  He is currently not smoking.  Advised continued cessation.   Stage 3a chronic kidney disease (HCC)  Mass of upper lobe of right lung Assessment & Plan: So far sampling was negative.  I spoke with Dr. Delton Coombes, pulmonology.  He will need close follow-up with either repeat biopsy and/or serial imaging.   Hyperlipidemia, unspecified hyperlipidemia  type Assessment & Plan: At goal on statin.  Continue.   Depression, major, single episode, moderate (HCC) -     Citalopram Hydrobromide; Take 1 tablet (20 mg total) by mouth at bedtime.  Dispense: 90 tablet; Refill: 3  Essential hypertension Assessment & Plan: Stable.  Continue current medications.   Other orders -     Losartan Potassium; Take 1 tablet (25 mg  total) by mouth daily.  Dispense: 90 tablet; Refill: 3 -     Empagliflozin; Take 1 tablet (10 mg total) by mouth at bedtime.  Dispense: 90 tablet; Refill: 3    Follow-up:  3 months  Daziah Hesler Adriana Simas DO [facility]

## 2023-10-14 NOTE — Assessment & Plan Note (Signed)
 Doing well at this time.  Will continue aspirin.  Lipids are at goal.  Blood pressure well-controlled.  His main risk factor is tobacco abuse.  He is currently not smoking.  Advised continued cessation.

## 2023-10-24 ENCOUNTER — Encounter: Payer: Self-pay | Admitting: Neurology

## 2023-10-24 ENCOUNTER — Ambulatory Visit: Admitting: Neurology

## 2023-10-24 VITALS — BP 124/74 | HR 64 | Ht 69.0 in | Wt 171.4 lb

## 2023-10-24 DIAGNOSIS — H34232 Retinal artery branch occlusion, left eye: Secondary | ICD-10-CM

## 2023-10-24 DIAGNOSIS — I639 Cerebral infarction, unspecified: Secondary | ICD-10-CM

## 2023-10-24 DIAGNOSIS — F17210 Nicotine dependence, cigarettes, uncomplicated: Secondary | ICD-10-CM

## 2023-10-24 NOTE — Patient Instructions (Signed)
 I had a long d/w patient about his recent retinal artery branch occlusion and silent left subcortical stroke, risk for recurrent stroke/TIAs, personally independently reviewed imaging studies and stroke evaluation results and answered questions.Continue aspirin 81 mg daily  for secondary stroke prevention and maintain strict control of hypertension with blood pressure goal below 130/90, diabetes with hemoglobin A1c goal below 6.5% and lipids with LDL cholesterol goal below 70 mg/dL. I also advised the patient to eat a healthy diet with plenty of whole grains, cereals, fruits and vegetables, exercise regularly and maintain ideal body weight .I complemented the patient on smoking cessation and encouraged him to continue to do so.  Keep follow-up appointment with Dr. Baldwin Levee to discuss results of his lung biopsy.  Followup in the future with my nurse practitioner in 6 months or call earlier if necessary  Stroke Prevention Some medical conditions and behaviors can lead to a higher chance of having a stroke. You can help prevent a stroke by eating healthy, exercising, not smoking, and managing any medical conditions you have. Stroke is a leading cause of functional impairment. Primary prevention is particularly important because a majority of strokes are first-time events. Stroke changes the lives of not only those who experience a stroke but also their family and other caregivers. How can this condition affect me? A stroke is a medical emergency and should be treated right away. A stroke can lead to brain damage and can sometimes be life-threatening. If a person gets medical treatment right away, there is a better chance of surviving and recovering from a stroke. What can increase my risk? The following medical conditions may increase your risk of a stroke: Cardiovascular disease. High blood pressure (hypertension). Diabetes. High cholesterol. Sickle cell disease. Blood clotting disorders (hypercoagulable  state). Obesity. Sleep disorders (obstructive sleep apnea). Other risk factors include: Being older than age 53. Having a history of blood clots, stroke, or mini-stroke (transient ischemic attack, TIA). Genetic factors, such as race, ethnicity, or a family history of stroke. Smoking cigarettes or using other tobacco products. Taking birth control pills, especially if you also use tobacco. Heavy use of alcohol or drugs, especially cocaine and methamphetamine. Physical inactivity. What actions can I take to prevent this? Manage your health conditions High cholesterol levels. Eating a healthy diet is important for preventing high cholesterol. If cholesterol cannot be managed through diet alone, you may need to take medicines. Take any prescribed medicines to control your cholesterol as told by your health care provider. Hypertension. To reduce your risk of stroke, try to keep your blood pressure below 130/80. Eating a healthy diet and exercising regularly are important for controlling blood pressure. If these steps are not enough to manage your blood pressure, you may need to take medicines. Take any prescribed medicines to control hypertension as told by your health care provider. Ask your health care provider if you should monitor your blood pressure at home. Have your blood pressure checked every year, even if your blood pressure is normal. Blood pressure increases with age and some medical conditions. Diabetes. Eating a healthy diet and exercising regularly are important parts of managing your blood sugar (glucose). If your blood sugar cannot be managed through diet and exercise, you may need to take medicines. Take any prescribed medicines to control your diabetes as told by your health care provider. Get evaluated for obstructive sleep apnea. Talk to your health care provider about getting a sleep evaluation if you snore a lot or have excessive sleepiness. Make sure  that any other  medical conditions you have, such as atrial fibrillation or atherosclerosis, are managed. Nutrition Follow instructions from your health care provider about what to eat or drink to help manage your health condition. These instructions may include: Reducing your daily calorie intake. Limiting how much salt (sodium) you use to 1,500 milligrams (mg) each day. Using only healthy fats for cooking, such as olive oil, canola oil, or sunflower oil. Eating healthy foods. You can do this by: Choosing foods that are high in fiber, such as whole grains, and fresh fruits and vegetables. Eating at least 5 servings of fruits and vegetables a day. Try to fill one-half of your plate with fruits and vegetables at each meal. Choosing lean protein foods, such as lean cuts of meat, poultry without skin, fish, tofu, beans, and nuts. Eating low-fat dairy products. Avoiding foods that are high in sodium. This can help lower blood pressure. Avoiding foods that have saturated fat, trans fat, and cholesterol. This can help prevent high cholesterol. Avoiding processed and prepared foods. Counting your daily carbohydrate intake.  Lifestyle If you drink alcohol: Limit how much you have to: 0-1 drink a day for women who are not pregnant. 0-2 drinks a day for men. Know how much alcohol is in your drink. In the [place] one drink equals one 12 oz bottle of beer ( ), one 5 oz glass of wine ( ), or one 1 oz glass of hard liquor (44mL). Do not use any products that contain nicotine or tobacco. These products include cigarettes, chewing tobacco, and vaping devices, such as e-cigarettes. If you need help quitting, ask your health care provider. Avoid secondhand smoke. Do not use drugs. Activity  Try to stay at a healthy weight. Get at least 30 minutes of exercise on most days, such as: Fast walking. Biking. Swimming. Medicines Take over-the-counter and prescription medicines only as told by your health care  provider. Aspirin or blood thinners (antiplatelets or anticoagulants) may be recommended to reduce your risk of forming blood clots that can lead to stroke. Avoid taking birth control pills. Talk to your health care provider about the risks of taking birth control pills if: You are over 32 years old. You smoke. You get very bad headaches. You have had a blood clot. Where to find more information American Stroke Association: www.strokeassociation.org Get help right away if: You or a loved one has any symptoms of a stroke. "BE FAST" is an easy way to remember the main warning signs of a stroke: B - Balance. Signs are dizziness, sudden trouble walking, or loss of balance. E - Eyes. Signs are trouble seeing or a sudden change in vision. F - Face. Signs are sudden weakness or numbness of the face, or the face or eyelid drooping on one side. A - Arms. Signs are weakness or numbness in an arm. This happens suddenly and usually on one side of the body. S - Speech. Signs are sudden trouble speaking, slurred speech, or trouble understanding what people say. T - Time. Time to call emergency services. Write down what time symptoms started. You or a loved one has other signs of a stroke, such as: A sudden, severe headache with no known cause. Nausea or vomiting. Seizure. These symptoms may represent a serious problem that is an emergency. Do not wait to see if the symptoms will go away. Get medical help right away. Call your local emergency services (911 in the [place] Do not drive yourself to the hospital. Summary You can help  to prevent a stroke by eating healthy, exercising, not smoking, limiting alcohol intake, and managing any medical conditions you may have. Do not use any products that contain nicotine or tobacco. These include cigarettes, chewing tobacco, and vaping devices, such as e-cigarettes. If you need help quitting, ask your health care provider. Remember "BE FAST" for warning signs of a  stroke. Get help right away if you or a loved one has any of these signs. This information is not intended to replace advice given to you by your health care provider. Make sure you discuss any questions you have with your health care provider. Document Revised: 05/28/2022 Document Reviewed: 05/28/2022 Elsevier Patient Education  2024 [employer].

## 2023-10-24 NOTE — Progress Notes (Signed)
 [facility] [address] [place]. [place] [address] [phone]       OFFICE FOLLOW-UP NOTE  Mr. ABDULAZIZ TOMAN Date of Birth:  [birth year] Medical Record Number:  [id number]   HPI: Mr. Gruenberg is a 68 year old Caucasian male seen today for initial office follow-up visit following hospital consultation for stroke in March 2025.  He is accompanied by his wife.  History is obtained from them and review of electronic medical records and I personally reviewed pertinent available imaging films in PACS.  He has past medical history of chronic tobacco abuse, smoker's cough, diverticulosis, left renal mass and left renal pelvic cancer.  He presented on 10/02/2023 with left eye vision difficulties upon awakening from sleep on 10/01/2023.  He did see some light but everything was hazy.  He had more difficulty seeing in the lower field of vision then appropriate of vision.  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  Patient was referred to the hospital for workup.  He denied any focal neurological symptoms but MRI scan of the brain was obtained which showed a silent small left caudate infarct.  CT angiogram of the head and neck showed mild widespread atherosclerosis with intracranial arterial dolichoectasia.  Incidental right upper lobe spiculated lung mass was noted with superimposed emphysema.  2D echo showed ejection fraction of 60 to 65%.  Left atrial size was normal.  LDL cholesterol 52 mg percent.  Hemoglobin A1c was 5.5.  Patient was on no antithrombotics prior to admission and was advised to start aspirin alone as there was emergent need for lung biopsy for his newly discovered lung mass.  Patient is has quit smoking since then.  He had outpatient bronchoscopic biopsy on 10/08/2023 with Dr. Delton Coombes.  The primary cytology results are negative for malignancy but the definitive pathology is yet pending.  Patient states he has noticed some  improvement in his left eye.  He still has trouble seeing in the upper nasal field in the left eye but is able to see in the lower field.  He is tolerating aspirin well without bruising or bleeding.  He is not had any stroke or TIA symptoms..  He has no other complaints today.  He does have upcoming appointment with Dr. Delton Coombes to discuss his biopsy results and further treatment plan for his lung mass  ROS:   14 system review of systems is positive for vision loss, bruising all other systems negative  PMH:  Past Medical History:  Diagnosis Date   Cancer (HCC)    left renal pelvis cnacer-12/05/2017   Diverticulosis of colon    NO ISSUES IN OVER 12 YEARS    Dysuria    Hematuria    10-11-17: REPORTS I "NOTICED SOME BLOOD IN MY URINE TODAY AFTER NOT SEEING ANY SINCE MY SURGERY IN [place] . I JUST HAD MY BLOOD DRAWN AT [facility] THIS LAST MONDAY BUT THEY HAVENT CALLED ME ABOUT IT YET"   History of closed dislocation of shoulder 06/2014   LEFT --- POST IV SEDATION CLOSED REDUCTION IN ED   PONV (postoperative nausea and vomiting)    Renal mass, left    LEFT RENAL COLLECTING SYSTEM MASS   Smokers' cough (HCC)    OCC   Stroke (HCC) 09/2023   Acute ischemic infarct   Wears dentures    Wears glasses     Social History:  Social History   Socioeconomic History   Marital status: Married  Spouse name: Not on file   Number of children: Not on file   Years of education: Not on file   Highest education level: 12th grade  Occupational History   Not on file  Tobacco Use   Smoking status: Every Day    Current packs/day: 1.50    Average packs/day: 1.5 packs/day for 40.0 years (60.0 ttl pk-yrs)    Types: Cigarettes   Smokeless tobacco: Never  Vaping Use   Vaping status: Never Used  Substance and Sexual Activity   Alcohol use: No   Drug use: No   Sexual activity: Not on file  Other Topics Concern   Not on file  Social History Narrative   Not on file   Social Drivers of Health    Financial Resource Strain: Low Risk  (12/24/2022)   Overall Financial Resource Strain (CARDIA)    Difficulty of Paying Living Expenses: Not hard at all  Food Insecurity: No Food Insecurity (10/02/2023)   Hunger Vital Sign    Worried About Running Out of Food in the Last Year: Never true    Ran Out of Food in the Last Year: Never true  Transportation Needs: No Transportation Needs (10/02/2023)   PRAPARE - Administrator, Civil Service (Medical): No    Lack of Transportation (Non-Medical): No  Physical Activity: Insufficiently Active (12/24/2022)   Exercise Vital Sign    Days of Exercise per Week: 5 days    Minutes of Exercise per Session: 20 min  Stress: No Stress Concern Present (12/24/2022)   [employer] of Occupational Health - Occupational Stress Questionnaire    Feeling of Stress : Not at all  Social Connections: Socially Integrated (10/02/2023)   Social Connection and Isolation Panel [NHANES]    Frequency of Communication with Friends and Family: More than three times a week    Frequency of Social Gatherings with Friends and Family: Three times a week    Attends Religious Services: More than 4 times per year    Active Member of Clubs or Organizations: Yes    Attends Banker Meetings: More than 4 times per year    Marital Status: Married  Catering manager Violence: Not At Risk (10/02/2023)   Humiliation, Afraid, Rape, and Kick questionnaire    Fear of Current or Ex-Partner: No    Emotionally Abused: No    Physically Abused: No    Sexually Abused: No    Medications:   Current Outpatient Medications on File Prior to Visit  Medication Sig Dispense Refill   acetaminophen (TYLENOL) 500 MG tablet Take 2 tablets (1,000 mg total) by mouth every 6 (six) hours. 30 tablet 0   aspirin EC 81 MG tablet Take 1 tablet (81 mg total) by mouth daily. Swallow whole. 30 tablet 12   citalopram (CELEXA) 20 MG tablet Take 1 tablet (20 mg total) by mouth at bedtime. 90  tablet 3   empagliflozin (JARDIANCE) 10 MG TABS tablet Take 1 tablet (10 mg total) by mouth at bedtime. 90 tablet 3   losartan (COZAAR) 25 MG tablet Take 1 tablet (25 mg total) by mouth daily. 90 tablet 3   nicotine (NICODERM CQ - DOSED IN MG/24 HOURS) 21 mg/24hr patch APPLY 1 PATCH(21 MG) TOPICALLY TO THE SKIN DAILY (Patient taking differently: Place 21 mg onto the skin daily.) 28 patch 6   rosuvastatin (CRESTOR) 20 MG tablet TAKE 1 TABLET(20 MG) BY MOUTH DAILY 90 tablet 1   TRELEGY ELLIPTA 100-62.5-25 MCG/ACT AEPB INHALE 1 PUFF  INTO THE LUNGS DAILY (Patient taking differently: Inhale 1 puff into the lungs in the morning.) 60 each 6   No current facility-administered medications on file prior to visit.    Allergies:  No Known Allergies  Physical Exam General: well developed, well nourished, seated, in no evident distress Head: head normocephalic and atraumatic.  Neck: supple with no carotid or supraclavicular bruits Cardiovascular: regular rate and rhythm, no murmurs Musculoskeletal: no deformity Skin:  no rash/petichiae Vascular:  Normal pulses all extremities Vitals:   10/24/23 1600  BP: 124/74  Pulse: 64   Neurologic Exam Mental Status: Awake and fully alert. Oriented to place and time. Recent and remote memory intact. Attention span, concentration and fund of knowledge appropriate. Mood and affect appropriate.  Cranial Nerves: Fundoscopic exam reveals sharp disc margins. Pupils equal, briskly reactive to light. Extraocular movements full without nystagmus. Visual fields normal in the right eye.  Left eye impaired upper nasal visual field.Aaron Aas Hearing intact. Facial sensation intact. Face, tongue, palate moves normally and symmetrically.  Motor: Normal bulk and tone. Normal strength in all tested extremity muscles. Sensory.: intact to touch ,pinprick .position and vibratory sensation.  Coordination: Rapid alternating movements normal in all extremities. Finger-to-nose and heel-to-shin  performed accurately bilaterally. Gait and Station: Arises from chair without difficulty. Stance is normal. Gait demonstrates normal stride length and balance . Able to heel, toe and tandem walk with mild difficulty.  Reflexes: 1+ and symmetric. Toes downgoing.   NIHSS  0 Modified Rankin  1   ASSESSMENT: 68 year old Caucasian male with partial loss likely due to retinal artery branch occlusion as well as MRI showing silent left carotid subcortical infarct from small vessel disease and March 2025.  Incidental finding of upper lobe lung mass with recent biopsy final results pending. Vascular risk factors of cigarette smoking, hypertension, hyperlipidemia and diabetes    PLAN:I had a long d/w patient about his recent retinal artery branch occlusion and silent left subcortical stroke, risk for recurrent stroke/TIAs, personally independently reviewed imaging studies and stroke evaluation results and answered questions.Continue aspirin 81 mg daily  for secondary stroke prevention and maintain strict control of hypertension with blood pressure goal below 130/90, diabetes with hemoglobin A1c goal below 6.5% and lipids with LDL cholesterol goal below 70 mg/dL. I also advised the patient to eat a healthy diet with plenty of whole grains, cereals, fruits and vegetables, exercise regularly and maintain ideal body weight .I complemented the patient on smoking cessation and encouraged him to continue to do so.  Keep follow-up appointment with Dr. Baldwin Levee to discuss results of his lung biopsy.  Followup in the future with my nurse practitioner in 6 months or call earlier if necessary  Greater than 50% of time during this 35 minute visit was spent on counseling,explanation of diagnosis of retinal artery branch occlusion and silent lacunar stroke, planning of further management, discussion with patient and family and coordination of care Ardella Beaver, MD Note: This document was prepared with digital dictation and  possible smart phrase technology. Any transcriptional errors that result from this process are unintentional

## 2023-11-06 ENCOUNTER — Ambulatory Visit: Admitting: Pulmonary Disease

## 2023-11-06 ENCOUNTER — Encounter: Payer: Self-pay | Admitting: Pulmonary Disease

## 2023-11-06 VITALS — BP 114/76 | HR 56 | Ht 69.0 in | Wt 175.4 lb

## 2023-11-06 DIAGNOSIS — Z87891 Personal history of nicotine dependence: Secondary | ICD-10-CM | POA: Diagnosis not present

## 2023-11-06 DIAGNOSIS — R918 Other nonspecific abnormal finding of lung field: Secondary | ICD-10-CM

## 2023-11-06 NOTE — Patient Instructions (Addendum)
 It is nice to see you again  I am glad you quit smoking this is great news  Will repeat a CT scan in June and then follow-up and discuss next results  If is the same size or growing I think we need to consider another biopsy, we did discuss having it cut out even without a biopsy which I agree is a big commitment and a big ask.  If it is getting smaller that is great news and prior we will just need to continue to repeat CT scans to keep an eye on it.  Return to clinic in 8 weeks with Dr. Marygrace Snellen

## 2023-11-06 NOTE — Progress Notes (Signed)
 @Patient  ID: Michael Brady, male    DOB: 01-31-1956, 68 y.o.   MRN: [id number]  Chief Complaint  Patient presents with   Establish Care    Follow-up lung biopsy    Referring provider: Cook, Jayce G, DO  HPI:   68 y.o. man history of tobacco abuse quit smoking 09/2023 with emphysema on CT scan presented to ED with vision changes after ophthalmic exam with incidental lung mass whom we are seeing in follow up.  Multiple neurology notes reviewed.   In the hospital he denies significant cough.  No worsening shortness of breath or dyspnea on exertion.  Breathing feels fine.  He is a current smoker.  Smoking 3 to 4-day.  Smoked more in the past.  CT angiogram of the head and neck demonstrated significant emphysema and a 2 cm right upper lobe nodule.  CT abdomen pelvis chest with contrast thereafter confirmed to similar mass without mediastinal lymphadenopathy per report, I think there is a borderline 10R/11R just over 1 cm on my measurement but I see no significant lymphadenopathy at station 7 or station 4R, no significant lymphadenopathy on the left.   Discussed at length with patient this is most likely cancer based on appearance and size and location.  Advised him to quit smoking.  We discussed biopsy versus referral for to thoracic surgery for resection.  He would prefer a diagnosis prior to committing to possible surgical resection.  He underwent navigational bronchoscopy with biopsy 10/08/2023, Dr. Baldwin Levee.  Pathology results reviewed, no malignancy noted.  We discussed at length still ongoing concern for malignancy.  Consideration of serial or repeat CT scan in 3 months if we need to rebiopsy if not shrinking versus referral to thoracic surgery at this time given.  Questionaires / Pulmonary Flowsheets:   ACT:      No data to display          MMRC:     No data to display          Epworth:      No data to display          Tests:   FENO:  No results found for:  "NITRICOXIDE"  PFT:     No data to display          WALK:      No data to display          Imaging: Personally reviewed and as per EMR and discussion this note DG Chest Port 1 View Result Date: 10/08/2023 CLINICAL DATA:  Status post bronchoscopy with biopsy. EXAM: PORTABLE CHEST 1 VIEW COMPARISON:  09/06/2021 FINDINGS: The heart size and mediastinal contours are within normal limits. Pulmonary hyperinflation, consistent with COPD. No evidence of pneumothorax or pleural effusion. 2 cm spiculated nodule is seen in the medial right upper lobe, with tiny metallic marker. No evidence of pulmonary infiltrate or edema. IMPRESSION: No evidence of pneumothorax. 2 cm spiculated nodule in medial right upper lobe, better evaluated on recent CT. COPD. Electronically Signed   By: Marlyce Sine M.D.   On: 10/08/2023 10:37   DG C-ARM BRONCHOSCOPY Result Date: 10/08/2023 C-ARM BRONCHOSCOPY: Fluoroscopy was utilized by the requesting physician.  No radiographic interpretation.    Lab Results: Personally reviewed CBC    Component Value Date/Time   WBC 7.4 10/04/2023 0523   RBC 4.37 10/04/2023 0523   HGB 12.9 (L) 10/04/2023 0523   HGB 15.5 12/26/2022 0808   HCT 39.0 10/04/2023 0523   HCT 48.1 12/26/2022 0808  PLT 304 10/04/2023 0523   PLT 318 12/26/2022 0808   MCV 89.2 10/04/2023 0523   MCV 94 12/26/2022 0808   MCH 29.5 10/04/2023 0523   MCHC 33.1 10/04/2023 0523   RDW 13.1 10/04/2023 0523   RDW 12.3 12/26/2022 0808   LYMPHSABS 1.6 10/02/2023 1015   LYMPHSABS 2.0 04/02/2017 0833   MONOABS 0.7 10/02/2023 1015   EOSABS 0.1 10/02/2023 1015   EOSABS 0.1 04/02/2017 0833   BASOSABS 0.0 10/02/2023 1015   BASOSABS 0.0 04/02/2017 0833    BMET    Component Value Date/Time   NA 137 10/04/2023 0523   NA 138 06/26/2023 0907   K 3.9 10/04/2023 0523   CL 107 10/04/2023 0523   CO2 22 10/04/2023 0523   GLUCOSE 82 10/04/2023 0523   BUN 14 10/04/2023 0523   BUN 19 06/26/2023 0907    CREATININE 1.34 (H) 10/04/2023 0523   CALCIUM  8.5 (L) 10/04/2023 0523   GFRNONAA 58 (L) 10/04/2023 0523   GFRAA >60 03/15/2020 1458    BNP No results found for: "BNP"  ProBNP No results found for: "PROBNP"  Specialty Problems       Pulmonary Problems   COPD (chronic obstructive pulmonary disease) (HCC)   Mass of upper lobe of right lung    No Known Allergies  Immunization History  Administered Date(s) Administered   Tdap 08/27/2018    Past Medical History:  Diagnosis Date   Cancer (HCC)    left renal pelvis cnacer-12/05/2017   Diverticulosis of colon    NO ISSUES IN OVER 12 YEARS    Dysuria    Hematuria    10-11-17: REPORTS I "NOTICED SOME BLOOD IN MY URINE TODAY AFTER NOT SEEING ANY SINCE MY SURGERY IN [place] . I JUST HAD MY BLOOD DRAWN AT [facility] THIS LAST MONDAY BUT THEY HAVENT CALLED ME ABOUT IT YET"   History of closed dislocation of shoulder 06/2014   LEFT --- POST IV SEDATION CLOSED REDUCTION IN ED   PONV (postoperative nausea and vomiting)    Renal mass, left    LEFT RENAL COLLECTING SYSTEM MASS   Smokers' cough (HCC)    OCC   Stroke (HCC) 09/2023   Acute ischemic infarct   Wears dentures    Wears glasses     Tobacco History: Social History   Tobacco Use  Smoking Status Former   Current packs/day: 0.00   Average packs/day: 1.5 packs/day for 40.0 years (60.0 ttl pk-yrs)   Types: Cigarettes   Quit date: 10/06/2023   Years since quitting: 0.0  Smokeless Tobacco Never   Counseling given: Not Answered   Continue to not smoke  Outpatient Encounter Medications as of 11/06/2023  Medication Sig   acetaminophen  (TYLENOL ) 500 MG tablet Take 2 tablets (1,000 mg total) by mouth every 6 (six) hours.   aspirin  EC 81 MG tablet Take 1 tablet (81 mg total) by mouth daily. Swallow whole.   citalopram  (CELEXA ) 20 MG tablet Take 1 tablet (20 mg total) by mouth at bedtime.   empagliflozin  (JARDIANCE ) 10 MG TABS tablet Take 1 tablet (10 mg total) by mouth at  bedtime.   losartan  (COZAAR ) 25 MG tablet Take 1 tablet (25 mg total) by mouth daily.   nicotine  (NICODERM CQ  - DOSED IN MG/24 HOURS) 21 mg/24hr patch APPLY 1 PATCH(21 MG) TOPICALLY TO THE SKIN DAILY (Patient taking differently: Place 21 mg onto the skin daily.)   rosuvastatin  (CRESTOR ) 20 MG tablet TAKE 1 TABLET(20 MG) BY MOUTH DAILY   TRELEGY  ELLIPTA 100-62.5-25 MCG/ACT AEPB INHALE 1 PUFF INTO THE LUNGS DAILY (Patient taking differently: Inhale 1 puff into the lungs in the morning.)   No facility-administered encounter medications on file as of 11/06/2023.     Review of Systems  Review of Systems  No chest pain on exertion.  No orthopnea or PND.  Review of systems otherwise negative.  Physical Exam  BP 114/76   Pulse (!) 56   Ht 5\' 9"  (1.753 m)   Wt 175 lb 6.4 oz (79.6 kg)   SpO2 100%   BMI 25.90 kg/m   Wt Readings from Last 5 Encounters:  11/06/23 175 lb 6.4 oz (79.6 kg)  10/24/23 171 lb 6.4 oz (77.7 kg)  10/11/23 174 lb (78.9 kg)  10/08/23 170 lb (77.1 kg)  10/02/23 178 lb (80.7 kg)    BMI Readings from Last 5 Encounters:  11/06/23 25.90 kg/m  10/24/23 25.31 kg/m  10/11/23 25.70 kg/m  10/08/23 25.10 kg/m  10/02/23 26.29 kg/m     Physical Exam General: Sitting in chair no acute distress Eyes: EOMI, no icterus Neck: Supple, no JVP Pulmonary: Clear, normal work of breathing Cardiovascular: Warm, no edema Abdomen: Nondistended, bowel sounds present MSK: No synovitis, no joint effusion Neuro: Normal gait, no weakness Psych: Normal mood, full affect   Assessment & Plan:   Right upper lobe mass: 2 cm RUL spiculated nodule. Mayo risk 90.5% of malignancy.  Underwent bronchoscopic biopsy 10/08/2023, no malignant cells identified.  Discussed with patient referral to thoracic surgery at this time for consideration of resection versus repeat imaging and if no improvement repeat bronchoscopic biopsy.  After shared decision making we agreed for repeat CT scan 79-month  interval 12/2023.  And likely repeat bronchoscopy with biopsy we can discuss further at that point if enlarging or low better.  If improved we will likely proceed to continue serial imaging to make sure it continues to improve.  Tobacco abuse: Now in remission.  Quit with recent hospitalization with vision changes, small stroke, lung mass.  He was congratulated and encouraged to continue total abstinence.   Return in about 8 weeks (around 01/01/2024).   Guerry Leek, MD 11/06/2023   This appointment required 42 minutes of patient care (this includes precharting, chart review, review of results, face-to-face care, etc.).

## 2023-11-07 ENCOUNTER — Other Ambulatory Visit: Payer: Self-pay | Admitting: Family Medicine

## 2023-11-08 ENCOUNTER — Other Ambulatory Visit: Payer: Self-pay

## 2023-11-08 MED ORDER — NICOTINE 21 MG/24HR TD PT24: 21.0000 mg | MEDICATED_PATCH | Freq: Every day | TRANSDERMAL | 2 refills | Status: AC

## 2023-11-13 DIAGNOSIS — H2513 Age-related nuclear cataract, bilateral: Secondary | ICD-10-CM | POA: Diagnosis not present

## 2023-11-13 DIAGNOSIS — H0102A Squamous blepharitis right eye, upper and lower eyelids: Secondary | ICD-10-CM | POA: Diagnosis not present

## 2023-11-13 DIAGNOSIS — H34232 Retinal artery branch occlusion, left eye: Secondary | ICD-10-CM | POA: Diagnosis not present

## 2023-11-13 DIAGNOSIS — I639 Cerebral infarction, unspecified: Secondary | ICD-10-CM | POA: Diagnosis not present

## 2023-11-13 DIAGNOSIS — H0102B Squamous blepharitis left eye, upper and lower eyelids: Secondary | ICD-10-CM | POA: Diagnosis not present

## 2023-11-21 ENCOUNTER — Ambulatory Visit: Admitting: Pulmonary Disease

## 2023-11-26 DIAGNOSIS — C652 Malignant neoplasm of left renal pelvis: Secondary | ICD-10-CM | POA: Diagnosis not present

## 2023-11-26 DIAGNOSIS — Z125 Encounter for screening for malignant neoplasm of prostate: Secondary | ICD-10-CM | POA: Diagnosis not present

## 2023-11-26 DIAGNOSIS — Z905 Acquired absence of kidney: Secondary | ICD-10-CM | POA: Diagnosis not present

## 2023-11-26 DIAGNOSIS — C678 Malignant neoplasm of overlapping sites of bladder: Secondary | ICD-10-CM | POA: Diagnosis not present

## 2023-12-16 ENCOUNTER — Other Ambulatory Visit: Payer: Self-pay | Admitting: Urology

## 2023-12-18 ENCOUNTER — Ambulatory Visit
Admission: RE | Admit: 2023-12-18 | Discharge: 2023-12-18 | Disposition: A | Discharge status: Home / Self Care | Source: Ambulatory Visit | Attending: Pulmonary Disease | Admitting: Pulmonary Disease

## 2023-12-18 DIAGNOSIS — I7121 Aneurysm of the ascending aorta, without rupture: Secondary | ICD-10-CM | POA: Diagnosis not present

## 2023-12-18 DIAGNOSIS — J439 Emphysema, unspecified: Secondary | ICD-10-CM | POA: Diagnosis not present

## 2023-12-18 DIAGNOSIS — R911 Solitary pulmonary nodule: Secondary | ICD-10-CM | POA: Diagnosis not present

## 2023-12-18 DIAGNOSIS — R918 Other nonspecific abnormal finding of lung field: Secondary | ICD-10-CM

## 2023-12-18 DIAGNOSIS — I2510 Atherosclerotic heart disease of native coronary artery without angina pectoris: Secondary | ICD-10-CM | POA: Diagnosis not present

## 2023-12-18 NOTE — Progress Notes (Addendum)
 Anesthesia Review:  PCP: Jayce Cook LOV 12/25/2023  Cardiologist : none  Pulm- Matthew Hunsucker LOV 11/06/23  Neuro- Sethi LOV 10/24/23  PPM/ ICD: Device Orders: Rep Notified:  Chest x-ray : 10/08/23- 1 view  CT Chest- 10/02/23  EKG : 10/02/23  Echo : 10/03/23  Stress test: Cardiac Cath :   Activity level: can do a flight of stairs without difficulty  Sleep Study/ CPAP : none  Fasting Blood Sugar :      / Checks Blood Sugar -- times a day:    Blood Thinner/ Instructions /Last Dose: ASA / Instructions/ Last Dose :    10/08/23- Video Bronchoscopy  10/02/23- admitted with CVA - left eye stroke    BMp done 12/26/23 with Potassium of 6.2 routed to Dr Osborn Blaze on 12/26/23. [facility] aware PT was called at 1520pm on 12/26/23 per request of Sharlyn Deaner.  Sharlyn Deaner stated to instructed pt to avoid potassium rich foods and increae fluid intake.  Pt made aware of 6.2 Potassium result and was instructed to avoid potassium rich foods such as cucmber, tomatoes, cantaloupe, watermelon , potatoes, leafy greensand bananas.  PT also instructed to increase fluid intake and labwork would be rechecked am of surgery. PT voiced understanding.

## 2023-12-19 NOTE — Patient Instructions (Addendum)
 SURGICAL WAITING ROOM VISITATION  Patients having surgery or a procedure may have no more than 2 support people in the waiting area - these visitors may rotate.    Children under the age of 28 must have an adult with them who is not the patient.  Visitors with respiratory illnesses are discouraged from visiting and should remain at home.  If the patient needs to stay at the hospital during part of their recovery, the visitor guidelines for inpatient rooms apply. Pre-op nurse will coordinate an appropriate time for 1 support person to accompany patient in pre-op.  This support person may not rotate.    Please refer to the [facility] website for the visitor guidelines for Inpatients (after your surgery is over and you are in a regular room).       Your procedure is scheduled on: 01/01/24    Report to [facility] Main Entrance    Report to admitting at    1030AM   Call this number if you have problems the morning of surgery [phone]   Do not eat food  or drink liquids After Midnight.                  If you have questions, please contact your surgeon's office.       Oral Hygiene is also important to reduce your risk of infection.                                    Remember - BRUSH YOUR TEETH THE MORNING OF SURGERY WITH YOUR REGULAR TOOTHPASTE  DENTURES WILL BE REMOVED PRIOR TO SURGERY PLEASE DO NOT APPLY Poly grip OR ADHESIVES!!!   Do NOT smoke after Midnight   Stop all vitamins and herbal supplements 7 days before surgery.   Take these medicines the morning of surgery with A SIP OF WATER :   Inhalers as usual and bring,                Jardiance - Hold for 72 hours prior to procedure. Last dose on 12/28/23.  DO NOT TAKE ANY ORAL DIABETIC MEDICATIONS DAY OF YOUR SURGERY  Bring CPAP mask and tubing day of surgery.                              You may not have any metal on your body including hair pins, jewelry, and body piercing             Do not wear  make-up, lotions, powders, perfumes/cologne, or deodorant  Do not wear nail polish including gel and S&S, artificial/acrylic nails, or any other type of covering on natural nails including finger and toenails. If you have artificial nails, gel coating, etc. that needs to be removed by a nail salon please have this removed prior to surgery or surgery may need to be canceled/ delayed if the surgeon/ anesthesia feels like they are unable to be safely monitored.   Do not shave  48 hours prior to surgery.               Men may shave face and neck.   Do not bring valuables to the hospital. [facility] IS NOT             RESPONSIBLE   FOR VALUABLES.   Contacts, glasses, dentures or bridgework may not be worn into surgery.  Bring small overnight bag day of surgery.   DO NOT BRING YOUR HOME MEDICATIONS TO THE HOSPITAL. PHARMACY WILL DISPENSE MEDICATIONS LISTED ON YOUR MEDICATION LIST TO YOU DURING YOUR ADMISSION IN THE HOSPITAL!    Patients discharged on the day of surgery will not be allowed to drive home.  Someone NEEDS to stay with you for the first 24 hours after anesthesia.   Special Instructions: Bring a copy of your healthcare power of attorney and living will documents the day of surgery if you haven't scanned them before.              Please read over the following fact sheets you were given: IF YOU HAVE QUESTIONS ABOUT YOUR PRE-OP INSTRUCTIONS PLEASE CALL [phone]   If you received a COVID test during your pre-op visit  it is requested that you wear a mask when out in public, stay away from anyone that may not be feeling well and notify your surgeon if you develop symptoms. If you test positive for Covid or have been in contact with anyone that has tested positive in the last 10 days please notify you surgeon.    [facility] - Preparing for Surgery Before surgery, you can play an important role.  Because skin is not sterile, your skin needs to be as free of germs as possible.  You  can reduce the number of germs on your skin by washing with CHG (chlorahexidine gluconate) soap before surgery.  CHG is an antiseptic cleaner which kills germs and bonds with the skin to continue killing germs even after washing. Please DO NOT use if you have an allergy to CHG or antibacterial soaps.  If your skin becomes reddened/irritated stop using the CHG and inform your nurse when you arrive at Short Stay. Do not shave (including legs and underarms) for at least 48 hours prior to the first CHG shower.  You may shave your face/neck. Please follow these instructions carefully:  1.  Shower with CHG Soap the night before surgery and the  morning of Surgery.  2.  If you choose to wash your hair, wash your hair first as usual with your  normal  shampoo.  3.  After you shampoo, rinse your hair and body thoroughly to remove the  shampoo.                           4.  Use CHG as you would any other liquid soap.  You can apply chg directly  to the skin and wash                       Gently with a scrungie or clean washcloth.  5.  Apply the CHG Soap to your body ONLY FROM THE NECK DOWN.   Do not use on face/ open                           Wound or open sores. Avoid contact with eyes, ears mouth and genitals (private parts).                       Wash face,  Genitals (private parts) with your normal soap.             6.  Wash thoroughly, paying special attention to the area where your surgery  will be performed.  7.  Thoroughly rinse your body  with warm water  from the neck down.  8.  DO NOT shower/wash with your normal soap after using and rinsing off  the CHG Soap.                9.  Pat yourself dry with a clean towel.            10.  Wear clean pajamas.            11.  Place clean sheets on your bed the night of your first shower and do not  sleep with pets. Day of Surgery : Do not apply any lotions/deodorants the morning of surgery.  Please wear clean clothes to the hospital/surgery center.  FAILURE  TO FOLLOW THESE INSTRUCTIONS MAY RESULT IN THE CANCELLATION OF YOUR SURGERY PATIENT SIGNATURE_________________________________  NURSE SIGNATURE__________________________________  ________________________________________________________________________

## 2023-12-24 ENCOUNTER — Other Ambulatory Visit: Payer: Self-pay | Admitting: Family Medicine

## 2023-12-24 DIAGNOSIS — C678 Malignant neoplasm of overlapping sites of bladder: Secondary | ICD-10-CM | POA: Diagnosis not present

## 2023-12-25 ENCOUNTER — Ambulatory Visit: Payer: PPO | Admitting: Family Medicine

## 2023-12-25 VITALS — BP 125/75 | HR 61 | Temp 98.1°F | Ht 69.0 in | Wt 168.6 lb

## 2023-12-25 DIAGNOSIS — Z8673 Personal history of transient ischemic attack (TIA), and cerebral infarction without residual deficits: Secondary | ICD-10-CM

## 2023-12-25 DIAGNOSIS — D494 Neoplasm of unspecified behavior of bladder: Secondary | ICD-10-CM | POA: Diagnosis not present

## 2023-12-25 DIAGNOSIS — R918 Other nonspecific abnormal finding of lung field: Secondary | ICD-10-CM

## 2023-12-25 DIAGNOSIS — E785 Hyperlipidemia, unspecified: Secondary | ICD-10-CM

## 2023-12-25 DIAGNOSIS — I10 Essential (primary) hypertension: Secondary | ICD-10-CM | POA: Diagnosis not present

## 2023-12-25 DIAGNOSIS — J449 Chronic obstructive pulmonary disease, unspecified: Secondary | ICD-10-CM

## 2023-12-25 MED ORDER — TRELEGY ELLIPTA 100-62.5-25 MCG/ACT IN AEPB: 1.0000 | INHALATION_SPRAY | Freq: Every day | RESPIRATORY_TRACT | 6 refills | Status: DC

## 2023-12-25 NOTE — Assessment & Plan Note (Signed)
Stable on losartan. Continue.

## 2023-12-25 NOTE — Assessment & Plan Note (Addendum)
Stable on Trelegy.  Continue. 

## 2023-12-25 NOTE — Assessment & Plan Note (Signed)
At goal on Crestor.  Continue. 

## 2023-12-25 NOTE — Progress Notes (Signed)
 Subjective:  Patient ID: Michael Brady, male    DOB: 1955/08/04  Age: 68 y.o. MRN: [id number]  CC:  Follow up   HPI:  68 year old male presents for follow-up.  Patient has upcoming surgery with urology for resection of bladder tumor.  He has recently quit smoking.  He recently had a CT scan for surveillance regarding lung mass.  Still awaiting results.  Patient reports that he is feeling well.  No chest pain.  COPD stable on Trelegy.  Blood pressure well-controlled.  LDL has been at goal on Crestor .  Renal function stable.  He is on Jardiance  for CKD.  Patient Active Problem List   Diagnosis Date Noted   Bladder tumor 12/25/2023   History of stroke 12/25/2023   CKD (chronic kidney disease) stage 3, GFR 30-59 ml/min (HCC) 10/11/2023   Infrarenal abdominal aortic aneurysm (AAA) without rupture (HCC) 10/11/2023   CAD (coronary artery disease) 10/11/2023   Mass of upper lobe of right lung 10/03/2023   Essential hypertension 06/26/2023   COPD (chronic obstructive pulmonary disease) (HCC) 12/25/2022   History of small bowel obstruction 10/19/2021   PUD (peptic ulcer disease) 10/19/2021   History of renal pelvis cancer 08/26/2021   Severe Diverticulosis 08/26/2021   Smoker 05/31/2020   Depression, major, single episode, moderate (HCC) 11/21/2017   Hyperlipidemia 04/03/2017    Social Hx   Social History   Socioeconomic History   Marital status: Married    Spouse name: Not on file   Number of children: Not on file   Years of education: Not on file   Highest education level: 12th grade  Occupational History   Not on file  Tobacco Use   Smoking status: Former    Current packs/day: 0.00    Average packs/day: 1.5 packs/day for 40.0 years (60.0 ttl pk-yrs)    Types: Cigarettes    Quit date: 10/06/2023    Years since quitting: 0.2   Smokeless tobacco: Never  Vaping Use   Vaping status: Never Used  Substance and Sexual Activity   Alcohol use: No   Drug use: No   Sexual  activity: Not on file  Other Topics Concern   Not on file  Social History Narrative   Not on file   Social Drivers of Health   Financial Resource Strain: Low Risk  (12/22/2023)   Overall Financial Resource Strain (CARDIA)    Difficulty of Paying Living Expenses: Not hard at all  Food Insecurity: No Food Insecurity (12/22/2023)   Hunger Vital Sign    Worried About Running Out of Food in the Last Year: Never true    Ran Out of Food in the Last Year: Never true  Transportation Needs: No Transportation Needs (12/22/2023)   PRAPARE - Administrator, Civil Service (Medical): No    Lack of Transportation (Non-Medical): No  Physical Activity: Insufficiently Active (12/22/2023)   Exercise Vital Sign    Days of Exercise per Week: 1 day    Minutes of Exercise per Session: 10 min  Stress: No Stress Concern Present (12/22/2023)   [employer] of Occupational Health - Occupational Stress Questionnaire    Feeling of Stress: Not at all  Social Connections: Moderately Integrated (12/22/2023)   Social Connection and Isolation Panel    Frequency of Communication with Friends and Family: Twice a week    Frequency of Social Gatherings with Friends and Family: Three times a week    Attends Religious Services: More than 4 times per year  Active Member of Clubs or Organizations: No    Attends Engineer, structural: Not on file    Marital Status: Married    Review of Systems Per HPI  Objective:  BP 125/75   Pulse 61   Temp 98.1 F (36.7 C)   Ht 5' 9 (1.753 m)   Wt 168 lb 9.6 oz (76.5 kg)   SpO2 99%   BMI 24.90 kg/m      12/25/2023    8:47 AM 11/06/2023    9:23 AM 10/24/2023    4:00 PM  BP/Weight  Systolic BP 125 114 124  Diastolic BP 75 76 74  Wt. (Lbs) 168.6 175.4 171.4  BMI 24.9 kg/m2 25.9 kg/m2 25.31 kg/m2    Physical Exam Vitals and nursing note reviewed.  Constitutional:      General: He is not in acute distress.    Appearance: Normal appearance.   HENT:     Head: Normocephalic and atraumatic.   Eyes:     General:        Right eye: No discharge.        Left eye: No discharge.     Conjunctiva/sclera: Conjunctivae normal.    Cardiovascular:     Rate and Rhythm: Normal rate and regular rhythm.  Pulmonary:     Effort: Pulmonary effort is normal.     Breath sounds: Normal breath sounds. No wheezing, rhonchi or rales.   Neurological:     Mental Status: He is alert.   Psychiatric:        Mood and Affect: Mood normal.        Behavior: Behavior normal.     Lab Results  Component Value Date   WBC 7.4 10/04/2023   HGB 12.9 (L) 10/04/2023   HCT 39.0 10/04/2023   PLT 304 10/04/2023   GLUCOSE 82 10/04/2023   CHOL 114 10/03/2023   TRIG 144 10/03/2023   HDL 33 (L) 10/03/2023   LDLCALC 52 10/03/2023   ALT 15 10/03/2023   AST 20 10/03/2023   NA 137 10/04/2023   K 3.9 10/04/2023   CL 107 10/04/2023   CREATININE 1.34 (H) 10/04/2023   BUN 14 10/04/2023   CO2 22 10/04/2023   INR 1.0 10/02/2023   HGBA1C 5.5 10/03/2023     Assessment & Plan:  Essential hypertension Assessment & Plan: Stable on losartan .  Continue.   Bladder tumor Assessment & Plan: Has upcoming surgery.  Has quit smoking.   History of stroke  Chronic obstructive pulmonary disease, unspecified COPD type (HCC) Assessment & Plan: Stable on Trelegy.  Continue.  Orders: -     Trelegy Ellipta ; Inhale 1 puff into the lungs daily.  Dispense: 60 each; Refill: 6  Hyperlipidemia, unspecified hyperlipidemia type Assessment & Plan: At goal on Crestor .  Continue.   Mass of upper lobe of right lung Assessment & Plan: High risk.  Awaiting CT results.    Follow-up:  6 months  Tasmin Exantus Debrah Fan DO [facility]

## 2023-12-25 NOTE — Assessment & Plan Note (Signed)
 Has upcoming surgery.  Has quit smoking.

## 2023-12-25 NOTE — Patient Instructions (Signed)
 Continue your medications.  Follow up in 6 months.  Take care  Dr. Adriana Simas

## 2023-12-25 NOTE — Assessment & Plan Note (Signed)
 High risk.  Awaiting CT results.

## 2023-12-26 ENCOUNTER — Encounter (HOSPITAL_COMMUNITY): Payer: Self-pay

## 2023-12-26 ENCOUNTER — Other Ambulatory Visit: Payer: Self-pay

## 2023-12-26 ENCOUNTER — Encounter (HOSPITAL_COMMUNITY)
Admission: RE | Admit: 2023-12-26 | Discharge: 2023-12-26 | Disposition: A | Discharge status: Home / Self Care | Source: Ambulatory Visit | Attending: Urology | Admitting: Urology

## 2023-12-26 VITALS — BP 127/84 | HR 58 | Temp 97.8°F | Resp 16 | Ht 69.0 in | Wt 164.0 lb

## 2023-12-26 DIAGNOSIS — N189 Chronic kidney disease, unspecified: Secondary | ICD-10-CM | POA: Diagnosis not present

## 2023-12-26 DIAGNOSIS — Z8673 Personal history of transient ischemic attack (TIA), and cerebral infarction without residual deficits: Secondary | ICD-10-CM | POA: Insufficient documentation

## 2023-12-26 DIAGNOSIS — J439 Emphysema, unspecified: Secondary | ICD-10-CM | POA: Insufficient documentation

## 2023-12-26 DIAGNOSIS — Z01812 Encounter for preprocedural laboratory examination: Secondary | ICD-10-CM | POA: Diagnosis not present

## 2023-12-26 DIAGNOSIS — F17210 Nicotine dependence, cigarettes, uncomplicated: Secondary | ICD-10-CM | POA: Diagnosis not present

## 2023-12-26 DIAGNOSIS — C679 Malignant neoplasm of bladder, unspecified: Secondary | ICD-10-CM | POA: Insufficient documentation

## 2023-12-26 DIAGNOSIS — Z01818 Encounter for other preprocedural examination: Secondary | ICD-10-CM

## 2023-12-26 LAB — CBC
HCT: 45 % (ref 39.0–52.0)
Hemoglobin: 14.1 g/dL (ref 13.0–17.0)
MCH: 29.2 pg (ref 26.0–34.0)
MCHC: 31.3 g/dL (ref 30.0–36.0)
MCV: 93.2 fL (ref 80.0–100.0)
Platelets: 271 10*3/uL (ref 150–400)
RBC: 4.83 MIL/uL (ref 4.22–5.81)
RDW: 13.7 % (ref 11.5–15.5)
WBC: 7 10*3/uL (ref 4.0–10.5)
nRBC: 0 % (ref 0.0–0.2)

## 2023-12-26 LAB — BASIC METABOLIC PANEL WITH GFR
Anion gap: 7 (ref 5–15)
BUN: 24 mg/dL — ABNORMAL HIGH (ref 8–23)
CO2: 24 mmol/L (ref 22–32)
Calcium: 9.8 mg/dL (ref 8.9–10.3)
Chloride: 103 mmol/L (ref 98–111)
Creatinine, Ser: 1.63 mg/dL — ABNORMAL HIGH (ref 0.61–1.24)
GFR, Estimated: 46 mL/min — ABNORMAL LOW (ref >60)
Glucose, Bld: 90 mg/dL (ref 70–99)
Potassium: 6.2 mmol/L — ABNORMAL HIGH (ref 3.5–5.1)
Sodium: 134 mmol/L — ABNORMAL LOW (ref 135–145)

## 2023-12-27 NOTE — Anesthesia Preprocedure Evaluation (Signed)
 Anesthesia Evaluation    Airway        Dental   Pulmonary Patient abstained from smoking., former smoker          Cardiovascular      Neuro/Psych    GI/Hepatic   Endo/Other    Renal/GU      Musculoskeletal   Abdominal   Peds  Hematology   Anesthesia Other Findings   Reproductive/Obstetrics                              Anesthesia Physical Anesthesia Plan  ASA:   Anesthesia Plan:    Post-op Pain Management:    Induction:   PONV Risk Score and Plan:   Airway Management Planned:   Additional Equipment:   Intra-op Plan:   Post-operative Plan:   Informed Consent:   Plan Discussed with:   Anesthesia Plan Comments: (See PAT note from 6/19)         Anesthesia Quick Evaluation

## 2023-12-27 NOTE — Progress Notes (Signed)
 Case: [id number] Date/Time: 01/01/24 1215   Procedures:      TURBT (TRANSURETHRAL RESECTION OF BLADDER TUMOR)     CYSTOSCOPY, WITH RETROGRADE PYELOGRAM (Right)   Anesthesia type: General   Diagnosis: Malignant neoplasm of urinary bladder, unspecified site (HCC) [C67.9]   Pre-op diagnosis: RECURRENT BLADDER CANCER   Location: [facility] PROCEDURE [facility] / [facility]   Surgeons: Melody Spurling., MD       DISCUSSION: Michael Brady is a 68 yo male who presents to PAT prior to surgery above for bladder cancer. PMH of current smoking, COPD, post-operative N/V, hx of CVA (09/2023), left renal cancer (s/p XI Robot assisted laparoscopic left nephroureterectomy 12/11/17), bladder tumor (s/p TURBT 07/25/18), perforated gastroduodenal ulcer (s/p exploratory laparotomy with gastrorrhaphy and Tyrone Gallop patch repair, repair of incarcerated incisional hernia 08/26/21 & treatment for H. Pylori), pancreatitis (09/2021, likely related to clarithromycin ), RUL lung mass s/p bronchoscopy (10/08/2023), CKD.  Pt admitted for acute CVA from 10/02/23 - 10/04/23 for loss of vision of his left eye on 10/01/23. Incidentally noted lung mass and bladder mass noted during w/u. Referred to Pulmonology and Urology. He is now off Plavix  and just on ASA monotherapy.  Pt follows with Pulmonology for RUL mass. He underwent bronch in 10/2023 which was negative for malignancy although this is highly suspected. He had a repeat CT Chest on 12/18/23. Report not available yet.  Seen by PCP on 12/25/23. COPD stable on inhalers. CKD stable.  LD Jardiance : 6/21  VS: BP 127/84   Pulse (!) 58   Temp 36.6 C (Oral)   Resp 16   Ht 5' 9 (1.753 m)   Wt 74.4 kg   SpO2 99%   BMI 24.22 kg/m   PROVIDERS: Cook, Jayce G, DO   LABS: Labs reviewed: Repeat BMP DOS. K 6.2. Pt was advised to hydrate and avoid K rich foods. (all labs ordered are listed, but only abnormal results are displayed)  Labs Reviewed  BASIC METABOLIC PANEL WITH GFR - Abnormal; Notable for  the following components:      Result Value   Sodium 134 (*)    Potassium 6.2 (*)    BUN 24 (*)    Creatinine, Ser 1.63 (*)    GFR, Estimated 46 (*)    All other components within normal limits  CBC     IMAGES: CT Chest/abd/pelvis 10/02/23: IMPRESSION: 1. Spiculated nodule in the right upper lobe measuring up to 2.0 cm worrisome for malignancy. 2. Right lower lobe peribronchial thickening with cluster of tree-in-bud opacities in the right lower lobe with associated nodular densities measuring up to 6 mm. Findings are favored as infectious/inflammatory. Metastatic nodules in the right lower lobe are not excluded given above findings. 3. Severe emphysema. 4. Two small filling defects along the superior bladder measuring up to 1 cm may represent bladder wall masses. Recommend further evaluation with cystoscopy. 5. Diffuse wall thickening of the sigmoid colon without surrounding inflammation. Findings may be related to colitis or diverticulitis. 6. Scattered air-fluid levels throughout the colon compatible with diarrheal illness. 7. 3.2 cm infrarenal abdominal aortic aneurysm. Recommend follow-up ultrasound every 3 years. 8. Two new small to moderate-sized fat containing hernias above the level of the umbilicus. - Aortic Atherosclerosis (ICD10-I70.0) and Emphysema (ICD10-J43.9).   CT Head & CTA Head/Neck 10/02/23: IMPRESSION: 1. Right upper lobe Spiculated Lung Mass superimposed on Emphysema (ICD10-J43.9) is nearly 3 cm and considered Bronchogenic Carcinoma until proven otherwise. PET-CT and referral to [facility]) would be most  valuable. 2. CTA is Negative for large vessel occlusion, positive for widespread atherosclerosis, and positive for widespread intracranial arterial dolichoectasia. - Moderate stenosis at both the origin of the Dominant Left Vertebral Artery and at its Vertebrobasilar junction. Right vertebral terminates in  PICA. - Moderate stenosis of the Right M1 segment. Mild left MCA origin stenosis. - Moderate to Severe stenosis of the Right PCA origin. And up to stenoses of bilateral P1 and P2 segments. 3.  No new intracranial abnormality since the MRI this morning. 4. Aortic Atherosclerosis (ICD10-I70.0).   MRI Brain 10/02/23: IMPRESSION: 1. Small acute left caudate infarct. 2. Mildly age advanced chronic small vessel ischemic disease.  EKG 10/02/23: Sinus rhythm at 63 bpm Baseline wander Confirmed by Dorenda Gandy [phone]) on 10/02/2023 9:56:44 AM  CV: Echo 10/03/23: IMPRESSIONS   1. Left ventricular ejection fraction, by estimation, is 60 to 65%. The  left ventricle has normal function. The left ventricle has no regional  wall motion abnormalities. There is mild left ventricular hypertrophy.  Left ventricular diastolic parameters  were grossly normal.   2. Right ventricular systolic function is normal. The right ventricular  size is mildly enlarged. Tricuspid regurgitation signal is inadequate for  assessing PA pressure.   3. The mitral valve is grossly normal. Trivial mitral valve  regurgitation. No evidence of mitral stenosis.   4. The aortic valve is tricuspid. Aortic valve regurgitation is not  visualized. No aortic stenosis is present.   5. The inferior vena cava is normal in size with greater than 50%  respiratory variability, suggesting right atrial pressure of 3 mmHg.  - Conclusion(s)/Recommendation(s): No intracardiac source of embolism  detected on this transthoracic study. Consider a transesophageal  echocardiogram to exclude cardiac source of embolism if clinically  indicated.   Past Medical History:  Diagnosis Date   Cancer (HCC)    left renal pelvis cnacer-12/05/2017   Diverticulosis of colon    NO ISSUES IN OVER 12 YEARS    Dysuria    Hematuria    10-11-17: REPORTS I NOTICED SOME BLOOD IN MY URINE TODAY AFTER NOT SEEING ANY SINCE MY SURGERY IN [place] . I JUST HAD MY  BLOOD DRAWN AT [facility] THIS LAST MONDAY BUT THEY HAVENT CALLED ME ABOUT IT YET   History of closed dislocation of shoulder 06/2014   LEFT --- POST IV SEDATION CLOSED REDUCTION IN ED   PONV (postoperative nausea and vomiting)    Renal mass, left    LEFT RENAL COLLECTING SYSTEM MASS   Smokers' cough (HCC)    OCC   Stroke (HCC) 09/2023   Acute ischemic infarct- left eye stroke   Wears dentures    Wears glasses     Past Surgical History:  Procedure Laterality Date   BRONCHIAL BIOPSY  10/08/2023   Procedure: BRONCHOSCOPY, WITH BIOPSY;  Surgeon: Denson Flake, MD;  Location: [facility];  Service: Pulmonary;;   BRONCHIAL BRUSHINGS  10/08/2023   Procedure: BRONCHOSCOPY, WITH BRUSH BIOPSY;  Surgeon: Denson Flake, MD;  Location: [facility];  Service: Pulmonary;;   BRONCHIAL NEEDLE ASPIRATION BIOPSY  10/08/2023   Procedure: BRONCHOSCOPY, WITH NEEDLE ASPIRATION BIOPSY;  Surgeon: Denson Flake, MD;  Location: [facility] ENDOSCOPY;  Service: Pulmonary;;   CARPAL TUNNEL RELEASE Left 01-25-2006   dr Murrel Arnt  [facility]   and LEFT THUMB PULLEY RELEASE   COLONOSCOPY N/A 05/07/2017   Procedure: COLONOSCOPY;  Surgeon: Alanda Allegra, MD;  [facility];  Service: Gastroenterology;  Laterality: N/A;   CYSTOSCOPY W/ RETROGRADES Right  07/25/2018   Procedure: CYSTOSCOPY WITH RETROGRADE PYELOGRAM;  Surgeon: Osborn Blaze, MD;  Location[facility];  Service: Urology;  Laterality: Right;   CYSTOSCOPY WITH URETEROSCOPY AND STENT PLACEMENT Left 07/29/2017   Procedure: CYSTOSCOPY WITH URETEROSCOPY AND STENT PLACEMENT;  Surgeon: Bart Born, MD;  Location: [facility];  Service: Urology;  Laterality: Left;   CYSTOSCOPY/URETEROSCOPY/HOLMIUM LASER/STENT PLACEMENT Left 07/15/2017   Procedure: CYSTOSCOPY, ATTEMTED URETEROSCOPY/,STENT PLACEMENT;  Surgeon: Bart Born, MD;  Location: [facility];  Service: Urology;  Laterality: Left;    CYSTOSCOPY/URETEROSCOPY/HOLMIUM LASER/STENT PLACEMENT Left 10/21/2017   Procedure: CYSTOSCOPY, RETROGRADE /URETEROSCOPY LEFT UPPER TRACT,BIOPSY, [place]fort LASER/STENT PLACEMENT;  Surgeon: Bart Born, MD;  Location: [facility];  Service: Urology;  Laterality: Left;   FUDUCIAL PLACEMENT  10/08/2023   Procedure: INSERTION, FIDUCIAL MARKER, GOLD;  Surgeon: Denson Flake, MD;  Location: [facility] ENDOSCOPY;  Service: Pulmonary;;   GASTRORRHAPHY N/A 08/26/2021   Procedure: Marsha Skeen;  Surgeon: Marijo Shove, DO;  Location[facility];  Service: General;  Laterality: N/A;   HOLMIUM LASER APPLICATION Left 10/21/2017   Procedure: HOLMIUM LASER APPLICATION;  Surgeon: Bart Born, MD;  Location[facility];  Service: Urology;  Laterality: Left;   INCISIONAL HERNIA REPAIR Right 08/26/2021   Procedure: HERNIA REPAIR INCISIONAL;  Surgeon: Marijo Shove, DO;  Location[facility];  Service: General;  Laterality: Right;   LAPAROTOMY N/A 08/26/2021   Procedure: EXPLORATORY LAPAROTOMY;  Surgeon: Marijo Shove, DO;  Location[facility];  Service: General;  Laterality: N/A;   ROBOT ASSITED LAPAROSCOPIC NEPHROURETERECTOMY Left 12/11/2017   Procedure: XI ROBOT ASSITED LAPAROSCOPIC NEPHROURETERECTOMY;  Surgeon: Osborn Blaze, MD;  Location[facility];  Service: Urology;  Laterality: Left;   SHOULDER SURGERY Right 1997   ROTATOR CUFF REPAIR    THULIUM LASER TURP (TRANSURETHRAL RESECTION OF PROSTATE) Left 07/29/2017   Procedure: CYSTOSCOPY, LEFT URETEROSCOPY WITH TUMOR BIOPSYTHULLIUM LASER ABLATION OF TUMOR AND LEFT URETERAL STENT EXCHANGE;  Surgeon: Bart Born, MD;  Location[facility];  Service: Urology;  Laterality: Left;   TRANSURETHRAL RESECTION OF BLADDER TUMOR N/A 07/25/2018   Procedure: TRANSURETHRAL RESECTION OF BLADDER TUMOR (TURBT);  Surgeon: Osborn Blaze, MD;  Location: [facility];  Service: Urology;  Laterality:  N/A;   VIDEO BRONCHOSCOPY WITH ENDOBRONCHIAL NAVIGATION N/A 10/08/2023   Procedure: VIDEO BRONCHOSCOPY WITH ENDOBRONCHIAL NAVIGATION;  Surgeon: Denson Flake, MD;  [facility];  Service: Pulmonary;  Laterality: N/A;   WISDOM TOOTH EXTRACTION      MEDICATIONS:  acetaminophen  (TYLENOL ) 500 MG tablet   aspirin  EC 81 MG tablet   citalopram  (CELEXA ) 20 MG tablet   empagliflozin  (JARDIANCE ) 10 MG TABS tablet   Fluticasone -Umeclidin-Vilant (TRELEGY ELLIPTA ) 100-62.5-25 MCG/ACT AEPB   losartan  (COZAAR ) 25 MG tablet   nicotine  (NICODERM CQ  - DOSED IN MG/24 HOURS) 21 mg/24hr patch   rosuvastatin  (CRESTOR ) 20 MG tablet   No current facility-administered medications for this encounter.   Antoinette Kirschner [facility] Short Stay/Anesthesiology [facility] Phone [phone] 12/27/2023 12:19 PM

## 2024-01-01 ENCOUNTER — Ambulatory Visit (HOSPITAL_COMMUNITY): Payer: Self-pay | Admitting: Medical

## 2024-01-01 ENCOUNTER — Ambulatory Visit (HOSPITAL_COMMUNITY): Payer: Self-pay | Admitting: Physician Assistant

## 2024-01-01 ENCOUNTER — Encounter (HOSPITAL_COMMUNITY): Payer: Self-pay | Admitting: Urology

## 2024-01-01 ENCOUNTER — Encounter (HOSPITAL_COMMUNITY)
Admission: RE | Disposition: A | Discharge status: Home / Self Care | Payer: Self-pay | Source: Home / Self Care | Attending: Urology

## 2024-01-01 ENCOUNTER — Ambulatory Visit (HOSPITAL_COMMUNITY)
Admission: RE | Admit: 2024-01-01 | Discharge: 2024-01-01 | Disposition: A | Discharge status: Home / Self Care | Attending: Urology | Admitting: Urology

## 2024-01-01 ENCOUNTER — Ambulatory Visit (HOSPITAL_COMMUNITY)

## 2024-01-01 ENCOUNTER — Other Ambulatory Visit: Payer: Self-pay

## 2024-01-01 DIAGNOSIS — I129 Hypertensive chronic kidney disease with stage 1 through stage 4 chronic kidney disease, or unspecified chronic kidney disease: Secondary | ICD-10-CM

## 2024-01-01 DIAGNOSIS — I10 Essential (primary) hypertension: Secondary | ICD-10-CM | POA: Insufficient documentation

## 2024-01-01 DIAGNOSIS — C679 Malignant neoplasm of bladder, unspecified: Secondary | ICD-10-CM

## 2024-01-01 DIAGNOSIS — N3289 Other specified disorders of bladder: Secondary | ICD-10-CM | POA: Insufficient documentation

## 2024-01-01 DIAGNOSIS — J449 Chronic obstructive pulmonary disease, unspecified: Secondary | ICD-10-CM | POA: Diagnosis not present

## 2024-01-01 DIAGNOSIS — Z8673 Personal history of transient ischemic attack (TIA), and cerebral infarction without residual deficits: Secondary | ICD-10-CM | POA: Insufficient documentation

## 2024-01-01 DIAGNOSIS — Z79899 Other long term (current) drug therapy: Secondary | ICD-10-CM | POA: Diagnosis not present

## 2024-01-01 DIAGNOSIS — E875 Hyperkalemia: Secondary | ICD-10-CM

## 2024-01-01 DIAGNOSIS — D090 Carcinoma in situ of bladder: Secondary | ICD-10-CM | POA: Insufficient documentation

## 2024-01-01 DIAGNOSIS — I2510 Atherosclerotic heart disease of native coronary artery without angina pectoris: Secondary | ICD-10-CM

## 2024-01-01 DIAGNOSIS — D494 Neoplasm of unspecified behavior of bladder: Secondary | ICD-10-CM | POA: Diagnosis not present

## 2024-01-01 DIAGNOSIS — Z905 Acquired absence of kidney: Secondary | ICD-10-CM | POA: Diagnosis not present

## 2024-01-01 DIAGNOSIS — N183 Chronic kidney disease, stage 3 unspecified: Secondary | ICD-10-CM

## 2024-01-01 DIAGNOSIS — Z87891 Personal history of nicotine dependence: Secondary | ICD-10-CM | POA: Diagnosis not present

## 2024-01-01 DIAGNOSIS — Z906 Acquired absence of other parts of urinary tract: Secondary | ICD-10-CM | POA: Diagnosis not present

## 2024-01-01 DIAGNOSIS — Z01818 Encounter for other preprocedural examination: Secondary | ICD-10-CM

## 2024-01-01 HISTORY — PX: TRANSURETHRAL RESECTION OF BLADDER TUMOR: SHX2575

## 2024-01-01 HISTORY — PX: CYSTOSCOPY W/ RETROGRADES: SHX1426

## 2024-01-01 LAB — BASIC METABOLIC PANEL WITH GFR
Anion gap: 9 (ref 5–15)
BUN: 23 mg/dL (ref 8–23)
CO2: 25 mmol/L (ref 22–32)
Calcium: 9.3 mg/dL (ref 8.9–10.3)
Chloride: 104 mmol/L (ref 98–111)
Creatinine, Ser: 1.42 mg/dL — ABNORMAL HIGH (ref 0.61–1.24)
GFR, Estimated: 54 mL/min — ABNORMAL LOW (ref >60)
Glucose, Bld: 95 mg/dL (ref 70–99)
Potassium: 5.6 mmol/L — ABNORMAL HIGH (ref 3.5–5.1)
Sodium: 138 mmol/L (ref 135–145)

## 2024-01-01 SURGERY — TURBT (TRANSURETHRAL RESECTION OF BLADDER TUMOR)
Anesthesia: General | Laterality: Right

## 2024-01-01 MED ORDER — GENTAMICIN SULFATE 40 MG/ML IJ SOLN
5.0000 mg/kg | INTRAVENOUS | Status: AC
  Administered 2024-01-01: 400 mg via INTRAVENOUS
  Filled 2024-01-01: qty 10

## 2024-01-01 MED ORDER — DEXAMETHASONE SODIUM PHOSPHATE 10 MG/ML IJ SOLN
INTRAMUSCULAR | Status: AC
  Filled 2024-01-01: qty 1

## 2024-01-01 MED ORDER — LIDOCAINE HCL (CARDIAC) PF 100 MG/5ML IV SOSY
PREFILLED_SYRINGE | INTRAVENOUS | Status: DC | PRN
  Administered 2024-01-01: 70 mg via INTRAVENOUS

## 2024-01-01 MED ORDER — PHENYLEPHRINE HCL (PRESSORS) 10 MG/ML IV SOLN: INTRAVENOUS | Status: DC | PRN

## 2024-01-01 MED ORDER — CHLORHEXIDINE GLUCONATE 0.12 % MT SOLN
15.0000 mL | Freq: Once | OROMUCOSAL | Status: AC
  Administered 2024-01-01: 15 mL via OROMUCOSAL

## 2024-01-01 MED ORDER — IOHEXOL 300 MG/ML  SOLN
INTRAMUSCULAR | Status: DC | PRN
  Administered 2024-01-01: 9 mL

## 2024-01-01 MED ORDER — TRAMADOL HCL 50 MG PO TABS: 50.0000 mg | ORAL_TABLET | Freq: Four times a day (QID) | ORAL | 0 refills | Status: DC | PRN

## 2024-01-01 MED ORDER — FENTANYL CITRATE (PF) 100 MCG/2ML IJ SOLN
INTRAMUSCULAR | Status: DC | PRN
  Administered 2024-01-01: 25 ug via INTRAVENOUS

## 2024-01-01 MED ORDER — ACETAMINOPHEN 500 MG PO TABS
1000.0000 mg | ORAL_TABLET | Freq: Once | ORAL | Status: AC
  Administered 2024-01-01: 1000 mg via ORAL
  Filled 2024-01-01: qty 2

## 2024-01-01 MED ORDER — SENNOSIDES-DOCUSATE SODIUM 8.6-50 MG PO TABS: 1.0000 | ORAL_TABLET | Freq: Two times a day (BID) | ORAL | 0 refills | Status: DC

## 2024-01-01 MED ORDER — FENTANYL CITRATE PF 50 MCG/ML IJ SOSY: 25.0000 ug | PREFILLED_SYRINGE | INTRAMUSCULAR | Status: DC | PRN

## 2024-01-01 MED ORDER — MIDAZOLAM HCL 2 MG/2ML IJ SOLN
INTRAMUSCULAR | Status: AC
  Filled 2024-01-01: qty 2

## 2024-01-01 MED ORDER — SODIUM CHLORIDE 0.9 % IR SOLN
Status: DC | PRN
  Administered 2024-01-01: 3000 mL via INTRAVESICAL

## 2024-01-01 MED ORDER — PROPOFOL 10 MG/ML IV BOLUS
INTRAVENOUS | Status: AC
Start: 2024-01-01 — End: 2024-01-01
  Filled 2024-01-01: qty 20

## 2024-01-01 MED ORDER — MIDAZOLAM HCL 2 MG/2ML IJ SOLN
INTRAMUSCULAR | Status: DC | PRN
  Administered 2024-01-01: 2 mg via INTRAVENOUS

## 2024-01-01 MED ORDER — LIDOCAINE HCL (PF) 2 % IJ SOLN
INTRAMUSCULAR | Status: AC
  Filled 2024-01-01: qty 5

## 2024-01-01 MED ORDER — PHENYLEPHRINE 80 MCG/ML (10ML) SYRINGE FOR IV PUSH (FOR BLOOD PRESSURE SUPPORT)
PREFILLED_SYRINGE | INTRAVENOUS | Status: DC | PRN
  Administered 2024-01-01 (×3): 160 ug via INTRAVENOUS

## 2024-01-01 MED ORDER — PROPOFOL 10 MG/ML IV BOLUS
INTRAVENOUS | Status: DC | PRN
  Administered 2024-01-01: 140 mg via INTRAVENOUS

## 2024-01-01 MED ORDER — ORAL CARE MOUTH RINSE: 15.0000 mL | Freq: Once | OROMUCOSAL | Status: AC

## 2024-01-01 MED ORDER — FENTANYL CITRATE (PF) 100 MCG/2ML IJ SOLN
INTRAMUSCULAR | Status: AC
  Filled 2024-01-01: qty 2

## 2024-01-01 MED ORDER — ONDANSETRON HCL 4 MG/2ML IJ SOLN
INTRAMUSCULAR | Status: DC | PRN
  Administered 2024-01-01: 4 mg via INTRAVENOUS

## 2024-01-01 MED ORDER — DROPERIDOL 2.5 MG/ML IJ SOLN: 0.6250 mg | Freq: Once | INTRAMUSCULAR | Status: DC | PRN

## 2024-01-01 MED ORDER — ONDANSETRON HCL 4 MG/2ML IJ SOLN
INTRAMUSCULAR | Status: AC
  Filled 2024-01-01: qty 2

## 2024-01-01 MED ORDER — LACTATED RINGERS IV SOLN: INTRAVENOUS | Status: DC

## 2024-01-01 MED ORDER — DEXAMETHASONE SODIUM PHOSPHATE 10 MG/ML IJ SOLN
INTRAMUSCULAR | Status: DC | PRN
  Administered 2024-01-01: 10 mg via INTRAVENOUS

## 2024-01-01 SURGICAL SUPPLY — 17 items
BAG URINE DRAIN 2000ML AR STRL (UROLOGICAL SUPPLIES) IMPLANT
BAG URO CATCHER STRL LF (MISCELLANEOUS) ×2 IMPLANT
CATH URETL OPEN END 6FR 70 (CATHETERS) IMPLANT
CLOTH BEACON ORANGE TIMEOUT ST (SAFETY) ×2 IMPLANT
DRAPE FOOT SWITCH (DRAPES) ×2 IMPLANT
ELECT REM PT RETURN 15FT ADLT (MISCELLANEOUS) ×2 IMPLANT
GLOVE SURG LX STRL 7.5 STRW (GLOVE) ×2 IMPLANT
GOWN STRL REUS W/ TWL XL LVL3 (GOWN DISPOSABLE) ×2 IMPLANT
GUIDEWIRE STR DUAL SENSOR (WIRE) ×2 IMPLANT
KIT TURNOVER KIT A (KITS) ×2 IMPLANT
LOOP CUT BIPOLAR 24F LRG (ELECTROSURGICAL) IMPLANT
MANIFOLD NEPTUNE II (INSTRUMENTS) ×2 IMPLANT
NS IRRIG 1000ML POUR BTL (IV SOLUTION) IMPLANT
PACK CYSTO (CUSTOM PROCEDURE TRAY) ×2 IMPLANT
SYRINGE TOOMEY IRRIG 70ML (MISCELLANEOUS) IMPLANT
TUBING CONNECTING 10 (TUBING) ×2 IMPLANT
TUBING UROLOGY SET (TUBING) ×2 IMPLANT

## 2024-01-01 NOTE — Transfer of Care (Signed)
 Immediate Anesthesia Transfer of Care Note  Patient: Michael Brady  Procedure(s) Performed: TURBT (TRANSURETHRAL RESECTION OF BLADDER TUMOR) CYSTOSCOPY, WITH RETROGRADE PYELOGRAM (Right)  Patient Location: PACU  Anesthesia Type:General  Level of Consciousness: drowsy  Airway & Oxygen Therapy: Patient Spontanous Breathing and Patient connected to face mask oxygen  Post-op Assessment: Report given to RN and Post -op Vital signs reviewed and stable  Post vital signs: Reviewed and stable  Last Vitals:  Vitals Value Taken Time  BP    Temp    Pulse 101 01/01/24 13:47  Resp 14 01/01/24 13:47  SpO2 100 % 01/01/24 13:47  Vitals shown include unfiled device data.  Last Pain:  Vitals:   01/01/24 1117  TempSrc:   PainSc: 0-No pain         Complications: No notable events documented.

## 2024-01-01 NOTE — Anesthesia Procedure Notes (Signed)
 Procedure Name: LMA Insertion Date/Time: 01/01/2024 1:03 PM  Performed by: Therisa Doyal CROME, CRNAPatient Re-evaluated:Patient Re-evaluated prior to induction Oxygen Delivery Method: Circle system utilized Preoxygenation: Pre-oxygenation with 100% oxygen Induction Type: IV induction LMA: LMA inserted LMA Size: 4.0 Number of attempts: 1 Placement Confirmation: positive ETCO2 and breath sounds checked- equal and bilateral Tube secured with: Tape Dental Injury: Teeth and Oropharynx as per pre-operative assessment

## 2024-01-01 NOTE — Anesthesia Postprocedure Evaluation (Signed)
 Anesthesia Post Note  Patient: Michael Brady  Procedure(s) Performed: TURBT (TRANSURETHRAL RESECTION OF BLADDER TUMOR) CYSTOSCOPY, WITH RETROGRADE PYELOGRAM (Right)     Patient location during evaluation: PACU Anesthesia Type: General Level of consciousness: sedated and patient cooperative Pain management: pain level controlled Vital Signs Assessment: post-procedure vital signs reviewed and stable Respiratory status: spontaneous breathing Cardiovascular status: stable Anesthetic complications: no   No notable events documented.  Last Vitals:  Vitals:   01/01/24 1430 01/01/24 1440  BP: 135/84 (!) 140/85  Pulse: (!) 53 60  Resp: 18 20  Temp: 36.6 C   SpO2: 96% 96%    Last Pain:  Vitals:   01/01/24 1440  TempSrc:   PainSc: 0-No pain                 Norleen Pope

## 2024-01-01 NOTE — Brief Op Note (Signed)
 01/01/2024  1:26 PM  PATIENT:  Michael Brady  68 y.o. male  PRE-OPERATIVE DIAGNOSIS:  RECURRENT BLADDER CANCER  POST-OPERATIVE DIAGNOSIS:  RECURRENT BLADDER CANCER  PROCEDURE:  Procedure(s): TURBT (TRANSURETHRAL RESECTION OF BLADDER TUMOR) (N/A) CYSTOSCOPY, WITH RETROGRADE PYELOGRAM (Right)  SURGEON:  Surgeons and Role:    * Manny, Ricardo KATHEE Raddle., MD - Primary  PHYSICIAN ASSISTANT:   ASSISTANTS: none   ANESTHESIA:   general  EBL:  minimal   BLOOD ADMINISTERED:none  DRAINS: none   LOCAL MEDICATIONS USED:  NONE  SPECIMEN:  Source of Specimen:  1 - bladder tumor; 2 - base of bladder tumor  DISPOSITION OF SPECIMEN:  PATHOLOGY  COUNTS:  YES  TOURNIQUET:  * No tourniquets in log *  DICTATION: .Other Dictation: Dictation Number [id number]  PLAN OF CARE: Discharge to home after PACU  PATIENT DISPOSITION:  PACU - hemodynamically stable.   Delay start of Pharmacological VTE agent (>24hrs) due to surgical blood loss or risk of bleeding: yes

## 2024-01-01 NOTE — H&P (Signed)
 Michael Brady is an 68 y.o. male.    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 Staging imaging and eval clincally localized.   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   PMH sig for shoulder surgery, lung mass (negative BX, follows pulm), No CV disease / blood thinners. His PCP is Garnette Fielding MD.   Today Michael Brady is seen to proceed with transurethral resection of bladder tumor for large voluem recurrent disease. No interval fevers. Cr 1.6. Hgb 14,  UA without infectious parameters most recentliy.    Past Medical History:  Diagnosis Date   Cancer (HCC)    left renal pelvis cnacer-12/05/2017   Diverticulosis of colon    NO ISSUES IN OVER 12 YEARS    Dysuria    Hematuria    10-11-17: REPORTS I NOTICED SOME BLOOD IN MY URINE TODAY AFTER NOT SEEING ANY SINCE MY SURGERY IN [place] . I JUST HAD MY BLOOD DRAWN AT [facility] THIS LAST MONDAY BUT THEY HAVENT CALLED ME ABOUT IT YET   History of closed dislocation of shoulder 06/2014   LEFT --- POST IV SEDATION CLOSED REDUCTION IN ED   PONV (postoperative nausea and vomiting)    Renal mass, left    LEFT RENAL COLLECTING SYSTEM MASS   Smokers' cough (HCC)    OCC   Stroke (HCC) 09/2023   Acute ischemic infarct- left eye stroke   Wears dentures    Wears glasses     Past Surgical History:  Procedure Laterality Date   BRONCHIAL BIOPSY  10/08/2023   Procedure: BRONCHOSCOPY, WITH BIOPSY;  Surgeon: Shelah Lamar RAMAN, MD;  Location: [facility];  Service:  Pulmonary;;   BRONCHIAL BRUSHINGS  10/08/2023   Procedure: BRONCHOSCOPY, WITH BRUSH BIOPSY;  Surgeon: Shelah Lamar RAMAN, MD;  Location: [facility];  Service: Pulmonary;;   BRONCHIAL NEEDLE ASPIRATION BIOPSY  10/08/2023   Procedure: BRONCHOSCOPY, WITH NEEDLE ASPIRATION BIOPSY;  Surgeon: Shelah Lamar RAMAN, MD;  Location: [facility] ENDOSCOPY;  Service: Pulmonary;;   CARPAL TUNNEL RELEASE Left 01-25-2006   dr barbarann  [facility]   and LEFT THUMB PULLEY RELEASE   COLONOSCOPY N/A 05/07/2017   Procedure: COLONOSCOPY;  Surgeon: Mavis Anes, MD;  Location: [facility];  Service: Gastroenterology;  Laterality: N/A;   CYSTOSCOPY W/ RETROGRADES Right 07/25/2018   Procedure: CYSTOSCOPY WITH RETROGRADE PYELOGRAM;  Surgeon: Alvaro Hummer, MD;  Location: [facility];  Service: Urology;  Laterality: Right;   CYSTOSCOPY WITH URETEROSCOPY AND STENT PLACEMENT Left 07/29/2017   Procedure: CYSTOSCOPY WITH URETEROSCOPY AND STENT PLACEMENT;  Surgeon: Chauncey Redell Agent, MD;  Location: [facility];  Service: Urology;  Laterality: Left;   CYSTOSCOPY/URETEROSCOPY/HOLMIUM LASER/STENT PLACEMENT Left 07/15/2017   Procedure: CYSTOSCOPY, ATTEMTED URETEROSCOPY/,STENT PLACEMENT;  Surgeon: Chauncey Redell Agent, MD;  Location: [facility];  Service: Urology;  Laterality: Left;   CYSTOSCOPY/URETEROSCOPY/HOLMIUM LASER/STENT PLACEMENT Left 10/21/2017   Procedure: CYSTOSCOPY, RETROGRADE /URETEROSCOPY LEFT UPPER TRACT,BIOPSY, CORRIN LASER/STENT PLACEMENT;  Surgeon: Chauncey Redell Agent, MD;  Location: [facility];  Service: Urology;  Laterality: Left;   FUDUCIAL PLACEMENT  10/08/2023   Procedure: INSERTION, FIDUCIAL MARKER, GOLD;  Surgeon: Shelah Lamar RAMAN, MD;  Location: [facility] ENDOSCOPY;  Service: Pulmonary;;   GASTRORRHAPHY N/A 08/26/2021   Procedure: ELIGIO;  Surgeon: Evonnie Dorothyann LABOR, DO;  Location: [facility];  Service: General;  Laterality: N/A;   HOLMIUM LASER APPLICATION Left  10/21/2017   Procedure: HOLMIUM LASER APPLICATION;  Surgeon: Chauncey Redell Agent, MD;  Location: [facility];  Service: Urology;  Laterality: Left;   INCISIONAL HERNIA REPAIR Right 08/26/2021   Procedure: HERNIA REPAIR INCISIONAL;  Surgeon: Evonnie Dorothyann LABOR, DO;  Location: [facility];  Service: General;  Laterality: Right;   LAPAROTOMY N/A 08/26/2021   Procedure: EXPLORATORY LAPAROTOMY;  Surgeon: Evonnie Dorothyann LABOR, DO;  Location: [facility];  Service: General;  Laterality: N/A;   ROBOT ASSITED LAPAROSCOPIC NEPHROURETERECTOMY Left 12/11/2017   Procedure: XI ROBOT ASSITED LAPAROSCOPIC NEPHROURETERECTOMY;  Surgeon: Alvaro Hummer, MD;  Location: [facility];  Service: Urology;  Laterality: Left;   SHOULDER SURGERY Right 1997   ROTATOR CUFF REPAIR    THULIUM LASER TURP (TRANSURETHRAL RESECTION OF PROSTATE) Left 07/29/2017   Procedure: CYSTOSCOPY, LEFT URETEROSCOPY WITH TUMOR BIOPSYTHULLIUM LASER ABLATION OF TUMOR AND LEFT URETERAL STENT EXCHANGE;  Surgeon: Chauncey Redell Agent, MD;  Location: [facility];  Service: Urology;  Laterality: Left;   TRANSURETHRAL RESECTION OF BLADDER TUMOR N/A 07/25/2018   Procedure: TRANSURETHRAL RESECTION OF BLADDER TUMOR (TURBT);  Surgeon: Alvaro Hummer, MD;  Location: [facility];  Service: Urology;  Laterality: N/A;   VIDEO BRONCHOSCOPY WITH ENDOBRONCHIAL NAVIGATION N/A 10/08/2023   Procedure: VIDEO BRONCHOSCOPY WITH ENDOBRONCHIAL NAVIGATION;  Surgeon: Shelah Lamar RAMAN, MD;  Location: [facility];  Service: Pulmonary;  Laterality: N/A;   WISDOM TOOTH EXTRACTION      Family History  Problem Relation Age of Onset   Colon cancer Neg Hx    Social History:  reports that he quit smoking about 2 months ago. His smoking use included cigarettes. He has a 60 pack-year smoking history. He has never used smokeless tobacco. He reports that he does not drink alcohol and does not use drugs.  Allergies: No Known Allergies  No medications  prior to admission.    No results found for this or any previous visit (from the past 48 hours). No results found.  Review of Systems  Constitutional:  Negative for chills and fever.  Genitourinary:  Positive for hematuria.  All other systems reviewed and are negative.   There were no vitals taken for this visit. Physical Exam Vitals reviewed.  HENT:     Head: Normocephalic.   Eyes:     Pupils: Pupils are equal, round, and reactive to light.    Cardiovascular:     Rate and Rhythm: Normal rate.  Pulmonary:     Effort: Pulmonary effort is normal.  Abdominal:     General: Abdomen is flat.  Genitourinary:    Comments: No CVAT at present  Musculoskeletal:        General: Normal range of motion.     Cervical back: Normal range of motion.   Skin:    General: Skin is warm.   Neurological:     General: No focal deficit present.     Mental Status: He is alert.   Psychiatric:        Mood and Affect: Mood normal.      Assessment/Plan  Proceed as planned with cysto, Rt retrograde, TURBT.  Risks,  benefits, alternatives, expected peri-op course discussed previously and reiterated today.   Ricardo KATHEE Alvaro Mickey., MD 01/01/2024, 6:40 AM

## 2024-01-01 NOTE — Discharge Instructions (Signed)
 1 - You may have urinary urgency (bladder spasms) and bloody urine on / off ffor up to 2 weeks. This is normal.  2 - Call MD or go to ER for fever >102, severe pain / nausea / vomiting not relieved by medications, or acute change in medical status

## 2024-01-02 ENCOUNTER — Encounter (HOSPITAL_COMMUNITY): Payer: Self-pay | Admitting: Urology

## 2024-01-02 LAB — SURGICAL PATHOLOGY

## 2024-01-02 NOTE — Op Note (Signed)
 NAME: [name], [name] MEDICAL RECORD NO: [id number] ACCOUNT NO: [id number] DATE OF BIRTH: 04/06/1956 FACILITY: [facility] LOCATION: [facility] PHYSICIAN: Ricardo Likens, MD  Operative Report   DATE OF PROCEDURE: 01/01/2024  PREOPERATIVE DIAGNOSIS:  Recurrent bladder cancer tumor.  PROCEDURES PERFORMED: 1.  Transurethral resection of bladder tumor volume medium. 2.  Right retrograde pyelogram with interpretation.  ESTIMATED BLOOD LOSS:  Nil.  COMPLICATIONS:  None.  SPECIMENS: 1.  Bladder tumor. 2.  Base of bladder tumor to permanent pathology.  FINDINGS:   1.  Approximately 4 cm total volume papillary tumor, mostly in the posterior superior bladder.  2.  Surgical absence of left kidney. 3.  Unremarkable right retrograde pyelogram.  INDICATIONS:  The patient is a pleasant 68 year old man with a history of left nephroureterectomy for high-grade  disease on the left side years ago.  He has been variably compliant with followup.  He was found on workup of incidental neoplasm on  imaging to have recurrence of bladder cancer.  This is clinically localized.  Options were discussed including recommending the path of transurethral resection for diagnostic therapeutic intent along with right retrograde pyelogram.  He presents for this  today.  Informed consent was obtained and placed in the medical record.  DESCRIPTION OF PROCEDURE:  The patient being Michael Brady was confirmed and the procedure being transurethral resection of bladder tumor with right retrograde was confirmed.  Procedure timeout was performed.  Intervenous antibiotics administered.   General anesthesia was induced.  The patient was placed into a low lithotomy position.  Sterile field was created.  Prepped and draped the patient's penis, peritoneum and proximal thighs using iodine.  Cystourethroscopy was performed using a 21-French  rigid cystoscope with offset lens.  Examination of the anterior and posterior urethra was  unremarkable.  There is minimal prostatic tissue.  Inspection of the urinary bladder revealed mild trabeculation and it was approximately 4 cm conglomerate  papillary tumor on a very well-formed stalk in the posterior superior aspect of the bladder.  The solitary right ureteral orifice was cannulated with a 6-French end-hole catheter and a right retrograde pyelogram was obtained.   Right retrograde pyelogram demonstrated single right ureter, single system right kidney.  No filling defects were noted.  Next, the cystoscope was exchanged with a 26-French resectoscope sheath, the visual obturator and using a resectoscope loop, a very  careful dissection was performed of the papillary tumor down to the superficial fibromuscular stroma of the urinary bladder.  This created bladder tumor fragments which were irrigated and set aside labelled as such.  Next, cold cup biopsy forceps  were used to obtain representative deep sampling of the presumed submuscular layer of the bladder deep to the tumor and three small fragments were set aside in this fashion labeled as base of bladder tumor.  Next, the resectoscope loop was used to fulgurate the base of the biopsy sites as well as the perimeter of the prior resection.  Hemostasis was excellent.  No evidence of perforation.  It was not felt that an interval catheterization would be warranted.   The bladder was partially emptied per cystoscope.  Procedure was then terminated.  The patient tolerated the procedure well.  No immediate periprocedural complications.  The patient taken to postanesthesia care in stable condition.  Plan to discharge  home after void.    MUK D: 01/01/2024 1:30:51 pm T: 01/02/2024 3:00:00 am  JOB: [id number]/ [id number]

## 2024-01-14 DIAGNOSIS — Z905 Acquired absence of kidney: Secondary | ICD-10-CM | POA: Diagnosis not present

## 2024-01-14 DIAGNOSIS — C652 Malignant neoplasm of left renal pelvis: Secondary | ICD-10-CM | POA: Diagnosis not present

## 2024-01-14 DIAGNOSIS — C678 Malignant neoplasm of overlapping sites of bladder: Secondary | ICD-10-CM | POA: Diagnosis not present

## 2024-01-20 ENCOUNTER — Ambulatory Visit: Admitting: Pulmonary Disease

## 2024-01-20 ENCOUNTER — Encounter: Payer: Self-pay | Admitting: Pulmonary Disease

## 2024-01-20 VITALS — BP 131/88 | HR 61 | Ht 69.0 in | Wt 170.0 lb

## 2024-01-20 DIAGNOSIS — R918 Other nonspecific abnormal finding of lung field: Secondary | ICD-10-CM

## 2024-01-20 DIAGNOSIS — F17211 Nicotine dependence, cigarettes, in remission: Secondary | ICD-10-CM | POA: Diagnosis not present

## 2024-01-20 NOTE — Patient Instructions (Signed)
 The mass or nodule has decreased by 50%.  This is great news.  Out of abundance of caution, we will repeat a CT scan in 6 months, 06/2024.  Just to make sure this continues to get smaller.  Return to clinic in 7 months, after CT scan, to discuss results of neck steps if needed.

## 2024-01-20 NOTE — Progress Notes (Signed)
 @Patient  ID: Michael Brady, male    DOB: June 27, 1956, 68 y.o.   MRN: [id number]  Chief Complaint  Patient presents with   Follow-up    Referring provider: Cook, Jayce G, DO  HPI:   68 y.o. man history of tobacco abuse quit smoking 09/2023 with emphysema on CT scan presented to ED with vision changes after ophthalmic exam with incidental lung mass whom we are seeing in follow up.  Most recent PCP note reviewed.  Most recent urology note reviewed.  Overall doing well.  Continues complete abstinence to cigarettes.  Repeat CT scan in the interim given nondiagnostic or at least no malignancy on biopsy, 33-month interval, reveals subsequent shrinking of mass by 50%.  Previously 2 x 1 cm now 1 x 0.5 cm.  Discussed great news.  Discussed repeating the CT scan of abundance of caution in the next few months.  At the recommendation of radiology.  HPI at initial visit: In the hospital he denies significant cough.  No worsening shortness of breath or dyspnea on exertion.  Breathing feels fine.  He is a current smoker.  Smoking 3 to 4-day.  Smoked more in the past.  CT angiogram of the head and neck demonstrated significant emphysema and a 2 cm right upper lobe nodule.  CT abdomen pelvis chest with contrast thereafter confirmed to similar mass without mediastinal lymphadenopathy per report, I think there is a borderline 10R/11R just over 1 cm on my measurement but I see no significant lymphadenopathy at station 7 or station 4R, no significant lymphadenopathy on the left.   Discussed at length with patient this is most likely cancer based on appearance and size and location.  Advised him to quit smoking.  We discussed biopsy versus referral for to thoracic surgery for resection.  He would prefer a diagnosis prior to committing to possible surgical resection.  He underwent navigational bronchoscopy with biopsy 10/08/2023, Dr. Shelah.  Pathology results reviewed, no malignancy noted.  We discussed at length still  ongoing concern for malignancy.  Consideration of serial or repeat CT scan in 3 months if we need to rebiopsy if not shrinking versus referral to thoracic surgery at this time given.  Questionaires / Pulmonary Flowsheets:   ACT:      No data to display          MMRC:     No data to display          Epworth:      No data to display          Tests:   FENO:  No results found for: NITRICOXIDE  PFT:     No data to display          WALK:      No data to display          Imaging: Personally reviewed and as per EMR and discussion this note DG C-Arm 1-60 Min-No Report Result Date: 01/01/2024 Fluoroscopy was utilized by the requesting physician.  No radiographic interpretation.    Lab Results: Personally reviewed CBC    Component Value Date/Time   WBC 7.0 12/26/2023 0922   RBC 4.83 12/26/2023 0922   HGB 14.1 12/26/2023 0922   HGB 15.5 12/26/2022 0808   HCT 45.0 12/26/2023 0922   HCT 48.1 12/26/2022 0808   PLT 271 12/26/2023 0922   PLT 318 12/26/2022 0808   MCV 93.2 12/26/2023 0922   MCV 94 12/26/2022 0808   MCH 29.2 12/26/2023 0922   MCHC  31.3 12/26/2023 0922   RDW 13.7 12/26/2023 0922   RDW 12.3 12/26/2022 0808   LYMPHSABS 1.6 10/02/2023 1015   LYMPHSABS 2.0 04/02/2017 0833   MONOABS 0.7 10/02/2023 1015   EOSABS 0.1 10/02/2023 1015   EOSABS 0.1 04/02/2017 0833   BASOSABS 0.0 10/02/2023 1015   BASOSABS 0.0 04/02/2017 0833    BMET    Component Value Date/Time   NA 138 01/01/2024 1127   NA 138 06/26/2023 0907   K 5.6 (H) 01/01/2024 1127   CL 104 01/01/2024 1127   CO2 25 01/01/2024 1127   GLUCOSE 95 01/01/2024 1127   BUN 23 01/01/2024 1127   BUN 19 06/26/2023 0907   CREATININE 1.42 (H) 01/01/2024 1127   CALCIUM  9.3 01/01/2024 1127   GFRNONAA 54 (L) 01/01/2024 1127   GFRAA >60 03/15/2020 1458    BNP No results found for: BNP  ProBNP No results found for: PROBNP  Specialty Problems       Pulmonary Problems   COPD  (chronic obstructive pulmonary disease) (HCC)   Mass of upper lobe of right lung    No Known Allergies  Immunization History  Administered Date(s) Administered   Tdap 08/27/2018    Past Medical History:  Diagnosis Date   Cancer (HCC)    left renal pelvis cnacer-12/05/2017   Diverticulosis of colon    NO ISSUES IN OVER 12 YEARS    Dysuria    Hematuria    10-11-17: REPORTS I NOTICED SOME BLOOD IN MY URINE TODAY AFTER NOT SEEING ANY SINCE MY SURGERY IN [place] . I JUST HAD MY BLOOD DRAWN AT [facility] THIS LAST MONDAY BUT THEY HAVENT CALLED ME ABOUT IT YET   History of closed dislocation of shoulder 06/2014   LEFT --- POST IV SEDATION CLOSED REDUCTION IN ED   PONV (postoperative nausea and vomiting)    Renal mass, left    LEFT RENAL COLLECTING SYSTEM MASS   Smokers' cough (HCC)    OCC   Stroke (HCC) 09/2023   Acute ischemic infarct- left eye stroke   Wears dentures    Wears glasses     Tobacco History: Social History   Tobacco Use  Smoking Status Former   Current packs/day: 0.00   Average packs/day: 1.5 packs/day for 40.0 years (60.0 ttl pk-yrs)   Types: Cigarettes   Quit date: 10/06/2023   Years since quitting: 0.2  Smokeless Tobacco Never   Counseling given: Not Answered   Continue to not smoke  Outpatient Encounter Medications as of 01/20/2024  Medication Sig   acetaminophen  (TYLENOL ) 500 MG tablet Take 2 tablets (1,000 mg total) by mouth every 6 (six) hours. (Patient taking differently: Take 1,000 mg by mouth 2 (two) times daily.)   aspirin  EC 81 MG tablet Take 1 tablet (81 mg total) by mouth daily. Swallow whole.   citalopram  (CELEXA ) 20 MG tablet Take 1 tablet (20 mg total) by mouth at bedtime.   empagliflozin  (JARDIANCE ) 10 MG TABS tablet Take 1 tablet (10 mg total) by mouth at bedtime.   Fluticasone -Umeclidin-Vilant (TRELEGY ELLIPTA ) 100-62.5-25 MCG/ACT AEPB Inhale 1 puff into the lungs daily.   losartan  (COZAAR ) 25 MG tablet TAKE 1 TABLET(25 MG) BY MOUTH  DAILY   nicotine  (NICODERM CQ  - DOSED IN MG/24 HOURS) 21 mg/24hr patch Place 1 patch (21 mg total) onto the skin daily. (Patient taking differently: Place 21 mg onto the skin every other day.)   rosuvastatin  (CRESTOR ) 20 MG tablet TAKE 1 TABLET(20 MG) BY MOUTH DAILY   senna-docusate (  SENOKOT-S) 8.6-50 MG tablet Take 1 tablet by mouth 2 (two) times daily. While taking strong pain meds to prevent constipation (Patient not taking: Reported on 01/20/2024)   traMADol  (ULTRAM ) 50 MG tablet Take 1-2 tablets (50-100 mg total) by mouth every 6 (six) hours as needed for moderate pain (pain score 4-6) or severe pain (pain score 7-10) (post-operatively). (Patient not taking: Reported on 01/20/2024)   No facility-administered encounter medications on file as of 01/20/2024.     Review of Systems  Review of Systems  N/a  Physical Exam  BP 131/88 (BP Location: Left Arm, Patient Position: Sitting, Cuff Size: Normal)   Pulse 61   Ht 5' 9 (1.753 m)   Wt 170 lb (77.1 kg)   SpO2 96%   BMI 25.10 kg/m   Wt Readings from Last 5 Encounters:  01/20/24 170 lb (77.1 kg)  01/01/24 164 lb (74.4 kg)  12/26/23 164 lb (74.4 kg)  12/25/23 168 lb 9.6 oz (76.5 kg)  11/06/23 175 lb 6.4 oz (79.6 kg)    BMI Readings from Last 5 Encounters:  01/20/24 25.10 kg/m  01/01/24 24.22 kg/m  12/26/23 24.22 kg/m  12/25/23 24.90 kg/m  11/06/23 25.90 kg/m     Physical Exam General: Sitting in chair no acute distress Eyes: EOMI, no icterus Neck: Supple, no JVP Pulmonary: Clear, normal work of breathing Cardiovascular: Warm, no edema Abdomen: Nondistended, bowel sounds present MSK: No synovitis, no joint effusion Neuro: Normal gait, no weakness Psych: Normal mood, full affect   Assessment & Plan:   Right upper lobe mass: 2 cm RUL spiculated nodule. Mayo risk 90.5% of malignancy.  Underwent bronchoscopic biopsy 10/08/2023, no malignant cells identified.  Repeat CT scan at 41-month interval 12/2023 shows subsequent  decrease in size by 50%.  Great news.  Will repeat CT scan at 55-month interval to ensure ongoing further improvement.  Tobacco abuse: Now in remission.  Quit with recent hospitalization with vision changes, small stroke, lung mass.  He was congratulated and encouraged to continue total abstinence.   Return in about 7 months (around 08/22/2024) for after CT scan.   Michael JONELLE Beals, MD 01/20/2024

## 2024-02-19 ENCOUNTER — Telehealth: Payer: Self-pay

## 2024-02-19 NOTE — Progress Notes (Signed)
   02/19/2024  Patient ID: Michael Brady, male   DOB: 06-16-1956, 68 y.o.   MRN: [id number]  This patient is appearing on a report for being at risk of failing the adherence measure for identified medications this calendar year.   Medication Adherence Summary (STAR/HEDIS Monitoring): Adherence Category: cholesterol (statin)    Drug Name: Rosuvastatin  20 mg  Last Fill or Sold Date:09/28/2023 Days' Supply: 90 (no remaining refills) - new prescription on file     Notes: ? Adherence data pulled from pharmacy claims portal Dr. Annemarie. I called and spoke with patient who denies missing doses yet unable to explain gap in fill history. He states that he just ran out of medication last week. I called [facility], with the patient on the phone, to get medication refilled.  ? Patient education provided on importance of adherence for chronic condition management and STAR quality performance. ? Reviewed barriers to adherence: none identified. ? Plan: Will follow up to verify medication has been picked before absolute fail date.    Dorcas Solian, PharmD Clinical Pharmacist Cell: [phone]

## 2024-02-21 ENCOUNTER — Telehealth: Payer: Self-pay

## 2024-02-21 NOTE — Progress Notes (Signed)
   02/21/2024  Patient ID: Michael Brady, male   DOB: [birth year], 68 y.o.   MRN: [id number]  This patient is appearing on a report for being at risk of failing the adherence measure for identified medications this calendar year.   Medication Adherence Summary (STAR/HEDIS Monitoring): Adherence Category: cholesterol (statin)    Drug Name: Rosuvastatin  20 mg  Last Fill or Sold Date:02/11/2024 Days' Supply: 90 (no remaining refills)      Notes: ? Adherence data not pulled from pharmacy claims portal Dr. Annemarie. I called and spoke with [facility] pharmacy and was told patient has not picked up rosuvastatin  but it is ready for pick up. I called patient to remind him the medication will be put back today and asked if he would be able to pick up rosuvastatin  and he said that he would.  ? Reviewed barriers to adherence: none identified. ? Plan: Will follow up to verify medication has been picked before absolute fail date.    Dorcas Solian, PharmD Clinical Pharmacist Cell: [phone]

## 2024-03-10 ENCOUNTER — Telehealth: Payer: Self-pay

## 2024-03-10 NOTE — Progress Notes (Signed)
   03/10/2024  Patient ID: Michael Brady, male   DOB: 05-09-1956, 68 y.o.   MRN: [id number]  This patient is appearing on a report for being at risk of failing the adherence measure for identified medications this calendar year.   Medication Adherence Summary (STAR/HEDIS Monitoring): Adherence Category: cholesterol (statin)    Drug Name: Rosuvastatin  20 mg  Sold Date:02/22/2024 Days' Supply: 90    These medications were not on the adherence report:  Drug Name: Jardiance  10 mg  Sold Date:02/11/2024 Days' Supply: 90   Drug Name: Losartan  25 mg  Sold Date:12/23/2023 Days' Supply: 90     Notes: ? Adherence data pulled from pharmacy claims portal Dr. Annemarie. ? Reviewed barriers to adherence: none identified. ? Plan: Will follow up prior to next refill    Dorcas Solian, PharmD Clinical Pharmacist Cell: [phone]

## 2024-04-21 DIAGNOSIS — C673 Malignant neoplasm of anterior wall of bladder: Secondary | ICD-10-CM | POA: Diagnosis not present

## 2024-04-21 DIAGNOSIS — R399 Unspecified symptoms and signs involving the genitourinary system: Secondary | ICD-10-CM | POA: Diagnosis not present

## 2024-04-21 DIAGNOSIS — Z905 Acquired absence of kidney: Secondary | ICD-10-CM | POA: Diagnosis not present

## 2024-04-21 DIAGNOSIS — C652 Malignant neoplasm of left renal pelvis: Secondary | ICD-10-CM | POA: Diagnosis not present

## 2024-05-13 DIAGNOSIS — I639 Cerebral infarction, unspecified: Secondary | ICD-10-CM | POA: Diagnosis not present

## 2024-05-13 DIAGNOSIS — H34232 Retinal artery branch occlusion, left eye: Secondary | ICD-10-CM | POA: Diagnosis not present

## 2024-05-13 DIAGNOSIS — H0102A Squamous blepharitis right eye, upper and lower eyelids: Secondary | ICD-10-CM | POA: Diagnosis not present

## 2024-05-13 DIAGNOSIS — H2513 Age-related nuclear cataract, bilateral: Secondary | ICD-10-CM | POA: Diagnosis not present

## 2024-05-13 DIAGNOSIS — H0102B Squamous blepharitis left eye, upper and lower eyelids: Secondary | ICD-10-CM | POA: Diagnosis not present

## 2024-05-14 ENCOUNTER — Other Ambulatory Visit: Payer: Self-pay | Admitting: Family Medicine

## 2024-05-19 ENCOUNTER — Ambulatory Visit: Admitting: Pulmonary Disease

## 2024-05-19 VITALS — BP 115/62 | HR 70 | Ht 69.0 in | Wt 173.0 lb

## 2024-05-19 DIAGNOSIS — J432 Centrilobular emphysema: Secondary | ICD-10-CM | POA: Diagnosis not present

## 2024-05-19 DIAGNOSIS — R918 Other nonspecific abnormal finding of lung field: Secondary | ICD-10-CM | POA: Diagnosis not present

## 2024-05-20 ENCOUNTER — Encounter: Payer: Self-pay | Admitting: Pulmonary Disease

## 2024-05-20 NOTE — Progress Notes (Signed)
 @Patient  ID: Michael Brady, male    DOB: [birth year], 68 y.o.   MRN: [id number]  Chief Complaint  Patient presents with   Medical Management of Chronic Issues    Pt states no concerns     Referring provider: Cook, Jayce G, DO  HPI:   68 y.o. man history of tobacco abuse quit smoking 09/2023 with emphysema on CT scan presented to ED with vision changes after ophthalmic exam with incidental lung mass with negative bronchoscopic biopsy and subsequent decrease in size on lung imaging whom we are seeing in follow-up of the same.  Overall doing well.  Continues to not smoke.  Using Trelegy, it seems to help.  Rare albuterol  use.  Has upcoming CT scan next month 62-month interval from prior that showed significant improvement just to demonstrate further improvement.  Anticipate if this shows ongoing improvement or resolution no further imaging is needed.  We discussed no further follow-up for this in the future if improving.  If not improving or different findings we discussed need for possible repeat CT scan.  He expressed understanding.  HPI at initial visit: In the hospital he denies significant cough.  No worsening shortness of breath or dyspnea on exertion.  Breathing feels fine.  He is a current smoker.  Smoking 3 to 4-day.  Smoked more in the past.  CT angiogram of the head and neck demonstrated significant emphysema and a 2 cm right upper lobe nodule.  CT abdomen pelvis chest with contrast thereafter confirmed to similar mass without mediastinal lymphadenopathy per report, I think there is a borderline 10R/11R just over 1 cm on my measurement but I see no significant lymphadenopathy at station 7 or station 4R, no significant lymphadenopathy on the left.   Discussed at length with patient this is most likely cancer based on appearance and size and location.  Advised him to quit smoking.  We discussed biopsy versus referral for to thoracic surgery for resection.  He would prefer a diagnosis prior to  committing to possible surgical resection.  He underwent navigational bronchoscopy with biopsy 10/08/2023, Dr. Shelah.  Pathology results reviewed, no malignancy noted.  We discussed at length still ongoing concern for malignancy.  Consideration of serial or repeat CT scan in 3 months if we need to rebiopsy if not shrinking versus referral to thoracic surgery at this time given.  Questionaires / Pulmonary Flowsheets:   ACT:      No data to display          MMRC:     No data to display          Epworth:      No data to display          Tests:   FENO:  No results found for: NITRICOXIDE  PFT:     No data to display          WALK:      No data to display          Imaging: Personally reviewed and as per EMR and discussion this note No results found.   Lab Results: Personally reviewed CBC    Component Value Date/Time   WBC 7.0 12/26/2023 0922   RBC 4.83 12/26/2023 0922   HGB 14.1 12/26/2023 0922   HGB 15.5 12/26/2022 0808   HCT 45.0 12/26/2023 0922   HCT 48.1 12/26/2022 0808   PLT 271 12/26/2023 0922   PLT 318 12/26/2022 0808   MCV 93.2 12/26/2023 0922   MCV  94 12/26/2022 0808   MCH 29.2 12/26/2023 0922   MCHC 31.3 12/26/2023 0922   RDW 13.7 12/26/2023 0922   RDW 12.3 12/26/2022 0808   LYMPHSABS 1.6 10/02/2023 1015   LYMPHSABS 2.0 04/02/2017 0833   MONOABS 0.7 10/02/2023 1015   EOSABS 0.1 10/02/2023 1015   EOSABS 0.1 04/02/2017 0833   BASOSABS 0.0 10/02/2023 1015   BASOSABS 0.0 04/02/2017 0833    BMET    Component Value Date/Time   NA 138 01/01/2024 1127   NA 138 06/26/2023 0907   K 5.6 (H) 01/01/2024 1127   CL 104 01/01/2024 1127   CO2 25 01/01/2024 1127   GLUCOSE 95 01/01/2024 1127   BUN 23 01/01/2024 1127   BUN 19 06/26/2023 0907   CREATININE 1.42 (H) 01/01/2024 1127   CALCIUM  9.3 01/01/2024 1127   GFRNONAA 54 (L) 01/01/2024 1127   GFRAA >60 03/15/2020 1458    BNP No results found for: BNP  ProBNP No results  found for: PROBNP  Specialty Problems       Pulmonary Problems   COPD (chronic obstructive pulmonary disease) (HCC)   Mass of upper lobe of right lung    No Known Allergies  Immunization History  Administered Date(s) Administered   Tdap 08/27/2018    Past Medical History:  Diagnosis Date   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 I JUST HAD MY BLOOD DRAWN AT [facility] THIS LAST MONDAY BUT THEY HAVENT CALLED ME ABOUT IT YET   History of closed dislocation of shoulder 06/2014   LEFT --- POST IV SEDATION CLOSED REDUCTION IN ED   PONV (postoperative nausea and vomiting)    Renal mass, left    LEFT RENAL COLLECTING SYSTEM MASS   Smokers' cough (HCC)    OCC   Stroke (HCC) 09/2023   Acute ischemic infarct- left eye stroke   Wears dentures    Wears glasses     Tobacco History: Social History   Tobacco Use  Smoking Status Former   Current packs/day: 0.00   Average packs/day: 1.5 packs/day for 40.0 years (60.0 ttl pk-yrs)   Types: Cigarettes   Quit date: 10/06/2023   Years since quitting: 0.6  Smokeless Tobacco Never   Counseling given: Not Answered   Continue to not smoke  Outpatient Encounter Medications as of 05/19/2024  Medication Sig   acetaminophen  (TYLENOL ) 500 MG tablet Take 2 tablets (1,000 mg total) by mouth every 6 (six) hours. (Patient taking differently: Take 1,000 mg by mouth 2 (two) times daily.)   aspirin  EC 81 MG tablet Take 1 tablet (81 mg total) by mouth daily. Swallow whole.   citalopram  (CELEXA ) 20 MG tablet Take 1 tablet (20 mg total) by mouth at bedtime.   Fluticasone -Umeclidin-Vilant (TRELEGY ELLIPTA ) 100-62.5-25 MCG/ACT AEPB Inhale 1 puff into the lungs daily.   JARDIANCE  10 MG TABS tablet TAKE 1 TABLET(10 MG) BY MOUTH DAILY BEFORE BREAKFAST    nicotine  (NICODERM CQ  - DOSED IN MG/24 HOURS) 21 mg/24hr patch Place 1 patch (21 mg total) onto the skin daily. (Patient taking differently: Place 21 mg onto the skin every other day.)   rosuvastatin  (CRESTOR ) 20 MG tablet TAKE 1 TABLET(20 MG) BY MOUTH DAILY   senna-docusate (SENOKOT-S) 8.6-50 MG tablet  Take 1 tablet by mouth 2 (two) times daily. While taking strong pain meds to prevent constipation   traMADol  (ULTRAM ) 50 MG tablet Take 1-2 tablets (50-100 mg total) by mouth every 6 (six) hours as needed for moderate pain (pain score 4-6) or severe pain (pain score 7-10) (post-operatively).   losartan  (COZAAR ) 25 MG tablet TAKE 1 TABLET(25 MG) BY MOUTH DAILY   No facility-administered encounter medications on file as of 05/19/2024.     Review of Systems  Review of Systems  N/a  Physical Exam  BP 115/62   Pulse 70   Ht 5' 9 (1.753 m) Comment: per pt  Wt 173 lb (78.5 kg)   SpO2 97%   BMI 25.55 kg/m   Wt Readings from Last 5 Encounters:  05/19/24 173 lb (78.5 kg)  01/20/24 170 lb (77.1 kg)  01/01/24 164 lb (74.4 kg)  12/26/23 164 lb (74.4 kg)  12/25/23 168 lb 9.6 oz (76.5 kg)    BMI Readings from Last 5 Encounters:  05/19/24 25.55 kg/m  01/20/24 25.10 kg/m  01/01/24 24.22 kg/m  12/26/23 24.22 kg/m  12/25/23 24.90 kg/m     Physical Exam General: Well-appearing Eyes: No icterus Neck: No JVP Pulmonary: Clear, normal work of breathing Cardiovascular: No edema Abdomen: Nondistended   Assessment & Plan:   Right upper lobe mass: 2 cm RUL spiculated nodule. Mayo risk 90.5% of malignancy.  Underwent bronchoscopic biopsy 10/08/2023, no malignant cells identified.  Repeat CT scan at 75-month interval 12/2023 demonstrated subsequent decrease in size by 50%.  Upcoming CT scan at additional 86-month interval 06/2024 ordered.  If further improvement then no further follow-up needed.  Emphysema: Continue Trelegy, good response in terms of dyspnea etc.   Return in about 1  year (around 05/19/2025).   Donnice JONELLE Beals, MD 05/20/2024

## 2024-05-23 ENCOUNTER — Other Ambulatory Visit: Payer: Self-pay | Admitting: Family Medicine

## 2024-06-03 ENCOUNTER — Ambulatory Visit: Admitting: Adult Health

## 2024-06-07 ENCOUNTER — Other Ambulatory Visit: Payer: Self-pay | Admitting: Family Medicine

## 2024-06-07 DIAGNOSIS — J449 Chronic obstructive pulmonary disease, unspecified: Secondary | ICD-10-CM

## 2024-06-09 ENCOUNTER — Ambulatory Visit (INDEPENDENT_AMBULATORY_CARE_PROVIDER_SITE_OTHER): Admitting: Adult Health

## 2024-06-09 ENCOUNTER — Encounter: Payer: Self-pay | Admitting: Adult Health

## 2024-06-09 VITALS — BP 123/76 | HR 66 | Ht 69.0 in | Wt 176.2 lb

## 2024-06-09 DIAGNOSIS — I639 Cerebral infarction, unspecified: Secondary | ICD-10-CM

## 2024-06-09 DIAGNOSIS — H34232 Retinal artery branch occlusion, left eye: Secondary | ICD-10-CM | POA: Diagnosis not present

## 2024-06-09 NOTE — Progress Notes (Signed)
 [facility] [address] [place]. [place] [address] [phone]       OFFICE FOLLOW-UP NOTE  Mr. Michael Brady Date of Birth:  1955/08/09 Medical Record Number:  [id number]    Primary neurologist: Dr. Rosemarie Reason for visit: Stroke follow-up   Chief Complaint  Patient presents with   Follow-up    Pt in 8 wife Pt here for stroke f/u Pt states no questions or concerns for today's visit      HPI:   Update 06/09/2024 JM: Patient returns for follow-up stroke visit accompanied by his wife.  Reports overall doing well from stroke standpoint.  No new stroke/TIA symptoms.  Continued left eye visual loss, he feels this has improved slightly over the past 6 months.  Vision loss is not bothersome and is able to drive without difficulty.  Routinely follows with ophthalmology every 6 months. Reports compliance on aspirin  and rosuvastatin  without side effects.  Routinely follows with PCP for stroke risk factor management, has f/u visit on 12/18.  Reports complete tobacco cessation since March.  No questions or concerns at this time.    Initial visit 10/24/2023 Dr. Rosemarie: Mr. Michael Brady is a 68 year old Caucasian male seen today for initial office follow-up visit following hospital consultation for stroke in March 2025.  He is accompanied by his wife.  History is obtained from them and review of electronic medical records and I personally reviewed pertinent available imaging films in PACS.  He has past medical history of chronic tobacco abuse, smoker's cough, diverticulosis, left renal mass and left renal pelvic cancer.  He presented on 10/02/2023 with left eye vision difficulties upon awakening from sleep on 10/01/2023.  He did see some light but everything was hazy.  He had more difficulty seeing in the lower field of vision then appropriate of vision.  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  Patient was referred to the  hospital for workup.  He denied any focal neurological symptoms but MRI scan of the brain was obtained which showed a silent small left caudate infarct.  CT angiogram of the head and neck showed mild widespread atherosclerosis with intracranial arterial dolichoectasia.  Incidental right upper lobe spiculated lung mass was noted with superimposed emphysema.  2D echo showed ejection fraction of 60 to 65%.  Left atrial size was normal.  LDL cholesterol 52 mg percent.  Hemoglobin A1c was 5.5.  Patient was on no antithrombotics prior to admission and was advised to start aspirin  alone as there was emergent need for lung biopsy for his newly discovered lung mass.  Patient is has quit smoking since then.  He had outpatient bronchoscopic biopsy on 10/08/2023 with Dr. Shelah.  The primary cytology results are negative for malignancy but the definitive pathology is yet pending.  Patient states he has noticed some improvement in his left eye.  He still has trouble seeing in the upper nasal field in the left eye but is able to see in the lower field.  He is tolerating aspirin  well without bruising or bleeding.  He is not had any stroke or TIA symptoms..  He has no other complaints today.  He does have upcoming appointment with Dr. 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 I JUST HAD MY BLOOD DRAWN AT [facility] THIS LAST MONDAY BUT THEY HAVENT CALLED ME ABOUT IT YET   History of closed dislocation of shoulder 06/2014   LEFT --- POST IV SEDATION CLOSED REDUCTION IN ED   PONV  (postoperative nausea and vomiting)    Renal mass, left    LEFT RENAL COLLECTING SYSTEM MASS   Smokers' cough (HCC)    OCC   Stroke (HCC) 09/2023   Acute ischemic infarct- left eye stroke   Wears dentures    Wears glasses     Social History:  Social History   Socioeconomic History   Marital status: Married    Spouse name: Not on file   Number of children: Not on file   Years of education: Not on file   Highest education level: 12th grade  Occupational History   Not on file  Tobacco Use   Smoking status: Former    Current packs/day: 0.00    Average packs/day: 1.5 packs/day for 40.0 years (60.0 ttl pk-yrs)    Types: Cigarettes    Quit date: 10/06/2023    Years since quitting: 0.6   Smokeless tobacco: Never  Vaping Use   Vaping status: Never Used  Substance and Sexual Activity   Alcohol use: No   Drug use: No   Sexual activity: Not on file  Other Topics Concern   Not on file  Social History Narrative   Not on file   Social Drivers of Health   Financial Resource Strain: Low Risk  (12/22/2023)   Overall Financial Resource Strain (CARDIA)    Difficulty of Paying Living Expenses: Not hard at all  Food Insecurity: No Food Insecurity (12/22/2023)   Hunger Vital Sign    Worried About Running Out of Food in the Last Year: Never true    Ran Out of Food in the Last Year: Never true  Transportation Needs: No Transportation Needs (12/22/2023)   PRAPARE - Administrator, Civil Service (Medical): No    Lack of Transportation (Non-Medical): No  Physical Activity: Insufficiently Active (12/22/2023)   Exercise Vital Sign    Days of Exercise per Week: 1 day    Minutes of Exercise per Session: 10 min  Stress: No Stress Concern Present (12/22/2023)   [employer] of Occupational Health - Occupational Stress Questionnaire    Feeling of Stress: Not at all  Social Connections: Moderately Integrated (12/22/2023)   Social Connection and Isolation Panel    Frequency of  Communication with Friends and Family: Twice a week    Frequency of Social Gatherings with Friends and Family: Three times a week    Attends Religious Services: More than 4 times per year    Active Member of Clubs or Organizations: No    Attends Banker Meetings: Not on file    Marital Status: Married  Intimate Partner Violence: Not At Risk (10/02/2023)   Humiliation, Afraid, Rape, and Kick questionnaire    Fear of Current or Ex-Partner: No    Emotionally Abused: No    Physically Abused: No    Sexually Abused: No    Medications:   Current Outpatient Medications on  File Prior to Visit  Medication Sig Dispense Refill   acetaminophen  (TYLENOL ) 500 MG tablet Take 2 tablets (1,000 mg total) by mouth every 6 (six) hours. (Patient taking differently: Take 1,000 mg by mouth 2 (two) times daily.) 30 tablet 0   aspirin  EC 81 MG tablet Take 1 tablet (81 mg total) by mouth daily. Swallow whole. 30 tablet 12   citalopram  (CELEXA ) 20 MG tablet Take 1 tablet (20 mg total) by mouth at bedtime. 90 tablet 3   JARDIANCE  10 MG TABS tablet TAKE 1 TABLET(10 MG) BY MOUTH DAILY BEFORE BREAKFAST 90 tablet 3   nicotine  (NICODERM CQ  - DOSED IN MG/24 HOURS) 21 mg/24hr patch Place 1 patch (21 mg total) onto the skin daily. (Patient taking differently: Place 21 mg onto the skin every other day.) 90 patch 2   rosuvastatin  (CRESTOR ) 20 MG tablet TAKE 1 TABLET(20 MG) BY MOUTH DAILY 90 tablet 1   senna-docusate (SENOKOT-S) 8.6-50 MG tablet Take 1 tablet by mouth 2 (two) times daily. While taking strong pain meds to prevent constipation 10 tablet 0   traMADol  (ULTRAM ) 50 MG tablet Take 1-2 tablets (50-100 mg total) by mouth every 6 (six) hours as needed for moderate pain (pain score 4-6) or severe pain (pain score 7-10) (post-operatively). 15 tablet 0   TRELEGY ELLIPTA  100-62.5-25 MCG/ACT AEPB INHALE 1 PUFF INTO THE LUNGS DAILY 60 each 6   No current facility-administered medications on file prior to visit.     Allergies:  No Known Allergies  Physical Exam Today's Vitals   06/09/24 1121  BP: 123/76  Pulse: 66  Weight: 176 lb 3.2 oz (79.9 kg)  Height: 5' 9 (1.753 m)   Body mass index is 26.02 kg/m.   General: well developed, well nourished, seated, in no evident distress  Neurologic Exam Mental Status: Awake and fully alert.  Fluent speech and language.  Oriented to place and time. Recent and remote memory intact. Attention span, concentration and fund of knowledge appropriate. Mood and affect appropriate.  Cranial Nerves: Pupils equal, briskly reactive to light. Extraocular movements full without nystagmus. Visual fields normal in the right eye.  Left eye impaired upper nasal visual field.SABRA Hearing intact. Facial sensation intact. Face, tongue, palate moves normally and symmetrically.  Motor: Normal bulk and tone. Normal strength in all tested extremity muscles. Sensory.: intact to touch ,pinprick .position and vibratory sensation.  Coordination: Rapid alternating movements normal in all extremities. Finger-to-nose and heel-to-shin performed accurately bilaterally. Gait and Station: Arises from chair without difficulty. Stance is normal. Gait demonstrates normal stride length and balance without use of AD. Able to heel, toe and tandem walk with mild difficulty.  Reflexes: 1+ and symmetric. Toes downgoing.         ASSESSMENT: COPELAND NEISEN is a 68 year old Caucasian male with partial visual loss likely due to retinal artery branch occlusion as well as MRI showing silent left carotid subcortical infarct from small vessel disease in March 2025. Vascular risk factors of hx of cigarette smoking, hypertension, hyperlipidemia and diabetes    PLAN:  -Residual OS upper vision impairment, stable, continue to follow with ophthalmology routinely - Continue aspirin  81 mg daily and rosuvastatin  20 mg daily for secondary stroke prevention managed/prescribed by PCP - Continue close PCP  follow-up for aggressive stroke risk factor management including BP goal<130/90, HLD with LDL goal<70 and DM with A1c.<7     No further recommendations from stroke standpoint as closely being followed by PCP.  He can follow-up here on an  as-needed basis but advised to call with any questions or concerns in the future.    I personally spent a total of 25 minutes in the care of the patient today including preparing to see the patient, performing a medically appropriate exam/evaluation, counseling and educating, and documenting clinical information in the EHR.  Harlene Bogaert, AGNP-BC  [facility] [address] [place], [place] [address]  Phone [phone] Fax [phone] Note: This document was prepared with digital dictation and possible smart phrase technology. Any transcriptional errors that result from this process are unintentional.

## 2024-06-18 ENCOUNTER — Ambulatory Visit: Admitting: Family Medicine

## 2024-06-18 ENCOUNTER — Ambulatory Visit: Payer: Self-pay

## 2024-06-18 VITALS — BP 118/81 | HR 68 | Temp 97.8°F | Ht 69.0 in | Wt 173.4 lb

## 2024-06-18 DIAGNOSIS — B029 Zoster without complications: Secondary | ICD-10-CM

## 2024-06-18 MED ORDER — VALACYCLOVIR HCL 1 G PO TABS: 1000.0000 mg | ORAL_TABLET | Freq: Three times a day (TID) | ORAL | 0 refills | Status: AC

## 2024-06-18 NOTE — Patient Instructions (Signed)
 Shingles  Shingles, or herpes zoster, is an infection. It gives you a skin rash and blisters. These infected areas may hurt a lot. Shingles only happens if: You've had chickenpox. You've been given a shot called a vaccine to protect you from getting chickenpox. Shingles is rare in this case. What are the causes? Shingles is caused by a germ called the varicella-zoster virus. This is the same germ that causes chickenpox. After you're exposed to the germ, it stays in your body but is dormant. This means it isn't active. Shingles happens if the germ becomes active again. This can happen years after you're first exposed to the germ. What increases the risk? You may be more likely to get shingles if: You're older than 66 years of age. You're under a lot of stress. You have a weak immune system. The immune system is your body's defense system. It may be weak if: You have human immunodeficiency virus (HIV). You have acquired immunodeficiency syndrome (AIDS). You have cancer. You take medicines that weaken your immune system. These include organ transplant medicines. What are the signs or symptoms? The first symptoms of shingles may be itching, tingling, or pain. Your skin may feel like it's burning. A few days or weeks later, you'll get a rash. Here's what you can expect: The rash is likely to be on one side of your body. The rash may be shaped like a belt or a band. Over time, it will turn into blisters filled with fluid. The blisters will break open and change into scabs. The scabs will dry up in about 2-3 weeks. You may also have: A fever. Chills. A headache. Nausea. How is this diagnosed? Shingles is diagnosed with a skin exam. A sample called a culture may be taken from one of your blisters and sent to a lab. This will show if you have shingles. How is this treated? The rash may last for several weeks. There's no cure for shingles, but your health care provider may give you medicines.  These medicines may: Help with pain. Help with itching. Help with irritation and swelling. Help you get better sooner. Help to prevent long-term problems. If the rash is on your face, you may need to see an eye doctor or an ear, nose, and throat (ENT) doctor. Follow these instructions at home: Medicines Take your medicines only as told by your provider. Put an anti-itch cream or numbing cream on the rash or blisters as told by your provider. Relieving itching and discomfort  To help with itching: Put cold, wet cloths called cold compresses on the rash or blisters. Take a cool bath. Try adding baking soda or dry oatmeal to the water. Do not bathe in hot water. Use calamine lotion on the rash or blisters. You can get this type of lotion at the store. Blister and rash care Keep your rash covered with a loose bandage. Wear loose clothes that don't rub on your rash. Take care of your rash as told by your provider. Make sure you: Wash your hands with soap and water for at least 20 seconds before and after you change your bandage. If you can't use soap and water, use hand sanitizer. Keep your rash and blisters clean by washing them with mild soap and cool water. Change your bandage. Check your rash every day for signs of infection. Check for: More redness, swelling, or pain. Fluid or blood. Warmth. Pus or a bad smell. Do not scratch your rash. Do not pick at your  blisters. To help you not scratch: Keep your fingernails clean and cut short. Try to wear gloves or mittens when you sleep. General instructions Rest. Wash your hands often with soap and water for at least 20 seconds. If you can't use soap and water, use hand sanitizer. Washing your hands lowers your chance of getting a skin infection. Your infection can cause chickenpox in others. If you have blisters that aren't scabs yet, stay away from: Babies. Pregnant people. Children who have eczema. Older people who have organ  transplants. People who have a long-term, or chronic, illness. Anyone who hasn't had chickenpox before. Anyone who hasn't gotten the chickenpox vaccine. How is this prevented? Vaccines are the best way to prevent you from getting chickenpox or shingles. Talk with your provider about getting these shots. Where to find more information Centers for Disease Control and Prevention (CDC): [employer] Contact a health care provider if: Your pain doesn't get better with medicine. Your pain doesn't get better after the rash heals. You have any signs of infection around the rash. Your rash or blisters get worse. You have a fever or chills. Get help right away if: The rash is on your face or nose. You have pain in your face or by your eye. You lose feeling on one side of your face. You have trouble seeing. You have ear pain or ringing in your ear. This information is not intended to replace advice given to you by your health care provider. Make sure you discuss any questions you have with your health care provider. Document Revised: 03/28/2023 Document Reviewed: 08/10/2022 Elsevier Patient Education  2024 [employer].

## 2024-06-18 NOTE — Progress Notes (Unsigned)
° °  Acute Office Visit  Subjective:     Patient ID: Michael Brady, male    DOB: 02-11-1956, 68 y.o.   MRN: [id number]  Chief Complaint  Patient presents with   Rash    HPI Patient is in today for ***  ROS      Objective:    BP 118/81   Pulse 68   Temp 97.8 F (36.6 C) (Temporal)   Ht 5' 9 (1.753 m)   Wt 173 lb 6.4 oz (78.7 kg)   SpO2 99%   BMI 25.61 kg/m  {Vitals History (Optional):23777}  Physical Exam  No results found for any visits on 06/18/24.      Assessment & Plan:   Problem List Items Addressed This Visit   None   No orders of the defined types were placed in this encounter.   No follow-ups on file.  Annabella CHRISTELLA Search, FNP

## 2024-06-18 NOTE — Telephone Encounter (Signed)
 FYI Only or Action Required?: FYI only for provider: appointment scheduled on 06/18/24. Pt scheduled with alt clinic due to availability.  Patient was last seen in primary care on 12/25/2023 by Cook, Jayce G, DO.  Called Nurse Triage reporting Rash.  Symptoms began 5 days ago.  Interventions attempted: Nothing.  Symptoms are: gradually worsening.  Triage Disposition: See PCP When Office is Open (Within 3 Days)  Patient/caregiver understands and will follow disposition?: Yes    Copied from CRM #[id number]. Topic: Clinical - Red Word Triage >> Jun 18, 2024  8:55 AM Harlene ORN wrote: Red Word that prompted transfer to Nurse Triage: Patient called, has got a Shingles-like rash. Had this rash for 4-5 days. Located on his back, under arms and on his chest. Asked the patient if he is in any pain, he says it's sore when you press on it. His next appointment is 12/18 @ 8:40 am. Reason for Disposition  Mild widespread rash  (Exception: Heat rash lasting 3 days or less.)  Answer Assessment - Initial Assessment Questions 1. APPEARANCE of RASH: What does the rash look like? (e.g., blisters, dry flaky skin, red spots, redness, sores)     Started as red spots but now areas feel raised, one starting to appear blister like  2. SIZE: How big are the spots? (e.g., tip of pen, eraser, coin; inches, centimeters)     Varies, one is like a strip under arm 3. LOCATION: Where is the rash located?     Left unilateral, upper chest, under arm, back, bicep 4. COLOR: What color is the rash? (Note: It is difficult to assess rash color in people with darker-colored skin. When this situation occurs, simply ask the caller to describe what they see.)     One looks mad, redness 5. ONSET: When did the rash begin?     X 5 days 6. FEVER: Do you have a fever? If Yes, ask: What is your temperature, how was it measured, and when did it start?     None 7. ITCHING: Does the rash itch? If Yes, ask: How bad  is the itch? (Scale 1-10; or mild, moderate, severe)     Mild 8. CAUSE: What do you think is causing the rash?     Unknown, pt believes it may be shingles 9. MEDICINE FACTORS: Have you started any new medicines within the last 2 weeks? (e.g., antibiotics)      None 10. OTHER SYMPTOMS: Do you have any other symptoms? (e.g., dizziness, headache, sore throat, joint pain)       Rash is tender to touch  Protocols used: Rash or Redness - [facility]

## 2024-06-19 ENCOUNTER — Encounter: Payer: Self-pay | Admitting: Family Medicine

## 2024-06-25 ENCOUNTER — Ambulatory Visit: Admitting: Family Medicine

## 2024-06-25 VITALS — BP 115/74 | HR 78 | Temp 97.7°F | Ht 69.0 in | Wt 169.0 lb

## 2024-06-25 DIAGNOSIS — J449 Chronic obstructive pulmonary disease, unspecified: Secondary | ICD-10-CM

## 2024-06-25 DIAGNOSIS — F321 Major depressive disorder, single episode, moderate: Secondary | ICD-10-CM

## 2024-06-25 DIAGNOSIS — I7121 Aneurysm of the ascending aorta, without rupture: Secondary | ICD-10-CM | POA: Insufficient documentation

## 2024-06-25 DIAGNOSIS — C679 Malignant neoplasm of bladder, unspecified: Secondary | ICD-10-CM | POA: Insufficient documentation

## 2024-06-25 DIAGNOSIS — N1831 Chronic kidney disease, stage 3a: Secondary | ICD-10-CM

## 2024-06-25 DIAGNOSIS — I25118 Atherosclerotic heart disease of native coronary artery with other forms of angina pectoris: Secondary | ICD-10-CM

## 2024-06-25 DIAGNOSIS — E785 Hyperlipidemia, unspecified: Secondary | ICD-10-CM | POA: Diagnosis not present

## 2024-06-25 MED ORDER — CITALOPRAM HYDROBROMIDE 20 MG PO TABS: 20.0000 mg | ORAL_TABLET | Freq: Every day | ORAL | 3 refills | Status: AC

## 2024-06-25 NOTE — Assessment & Plan Note (Signed)
 Stable on Trelegy

## 2024-06-25 NOTE — Assessment & Plan Note (Signed)
 Labs today to reassess. Continue Jardiance .

## 2024-06-25 NOTE — Assessment & Plan Note (Signed)
Needs annual follow up imaging

## 2024-06-25 NOTE — Patient Instructions (Signed)
Labs today.  Follow up in 6 months.  Take care  Dr. Alyxis Grippi  

## 2024-06-25 NOTE — Progress Notes (Signed)
 Subjective:  Patient ID: Michael Brady, male    DOB: [birth year]  Age: 68 y.o. MRN: [id number]  CC:   Chief Complaint  Patient presents with   6 month follow up    shingles follow up    HPI:  68 year old male with a complicated history (see below) presents for follow up.  Has quit smoking.  CAD stable. No chest pain.  COPD stable on Trelegy. Following closely with Pulm due to lung mass (has been improving).  Follows closely with Urology as well. Had resection of bladder tumor on 01/01/24.  Has resolving shingles. Currently on Valtrex . Improving.  CKD has been stable. Needs labs today.  Patient Active Problem List   Diagnosis Date Noted   Urothelial carcinoma of bladder (HCC) 06/25/2024   Ascending aortic aneurysm 06/25/2024   History of stroke 12/25/2023   CKD (chronic kidney disease) stage 3, GFR 30-59 ml/min (HCC) 10/11/2023   Infrarenal abdominal aortic aneurysm (AAA) without rupture 10/11/2023   CAD (coronary artery disease) 10/11/2023   Mass of upper lobe of right lung 10/03/2023   Essential hypertension 06/26/2023   COPD (chronic obstructive pulmonary disease) (HCC) 12/25/2022   History of small bowel obstruction 10/19/2021   PUD (peptic ulcer disease) 10/19/2021   History of renal pelvis cancer 08/26/2021   Severe Diverticulosis 08/26/2021   Depression, major, single episode, moderate (HCC) 11/21/2017   Hyperlipidemia 04/03/2017    Social Hx   Social History   Socioeconomic History   Marital status: Married    Spouse name: Not on file   Number of children: Not on file   Years of education: Not on file   Highest education level: 12th grade  Occupational History   Not on file  Tobacco Use   Smoking status: Former    Current packs/day: 0.00    Average packs/day: 1.5 packs/day for 40.0 years (60.0 ttl pk-yrs)    Types: Cigarettes    Quit date: 10/06/2023    Years since quitting: 0.7   Smokeless tobacco: Never  Vaping Use   Vaping status: Never Used   Substance and Sexual Activity   Alcohol use: No   Drug use: No   Sexual activity: Not on file  Other Topics Concern   Not on file  Social History Narrative   Pt lives with wife    Retired    Social Drivers of Health   Tobacco Use: Medium Risk (06/19/2024)   Patient History    Smoking Tobacco Use: Former    Smokeless Tobacco Use: Never    Passive Exposure: Not on Actuary Strain: Low Risk (12/22/2023)   Overall Financial Resource Strain (CARDIA)    Difficulty of Paying Living Expenses: Not hard at all  Food Insecurity: No Food Insecurity (12/22/2023)   Epic    Worried About Programme Researcher, Broadcasting/film/video in the Last Year: Never true    Ran Out of Food in the Last Year: Never true  Transportation Needs: No Transportation Needs (12/22/2023)   Epic    Lack of Transportation (Medical): No    Lack of Transportation (Non-Medical): No  Physical Activity: Insufficiently Active (12/22/2023)   Exercise Vital Sign    Days of Exercise per Week: 1 day    Minutes of Exercise per Session: 10 min  Stress: No Stress Concern Present (12/22/2023)   [employer] of Occupational Health - Occupational Stress Questionnaire    Feeling of Stress: Not at all  Social Connections: Moderately Integrated (12/22/2023)  Social Connection and Isolation Panel    Frequency of Communication with Friends and Family: Twice a week    Frequency of Social Gatherings with Friends and Family: Three times a week    Attends Religious Services: More than 4 times per year    Active Member of Clubs or Organizations: No    Attends Banker Meetings: Not on file    Marital Status: Married  Depression (PHQ2-9): Low Risk (06/25/2024)   Depression (PHQ2-9)    PHQ-2 Score: 0  Alcohol Screen: Not on file  Housing: Low Risk (12/22/2023)   Epic    Unable to Pay for Housing in the Last Year: No    Number of Times Moved in the Last Year: 0    Homeless in the Last Year: No  Utilities: Not At Risk  (10/02/2023)   [facility] Utilities    Threatened with loss of utilities: No  Health Literacy: Not on file    Review of Systems Per HPI  Objective:  BP 115/74   Pulse 78   Temp 97.7 F (36.5 C)   Ht 5' 9 (1.753 m)   Wt 169 lb (76.7 kg)   SpO2 97%   BMI 24.96 kg/m      06/25/2024    8:43 AM 06/18/2024   11:15 AM 06/09/2024   11:21 AM  BP/Weight  Systolic BP 115 118 123  Diastolic BP 74 81 76  Wt. (Lbs) 169 173.4 176.2  BMI 24.96 kg/m2 25.61 kg/m2 26.02 kg/m2    Physical Exam Vitals and nursing note reviewed.  Constitutional:      General: He is not in acute distress.    Appearance: Normal appearance.  HENT:     Head: Normocephalic and atraumatic.  Eyes:     General:        Right eye: No discharge.        Left eye: No discharge.     Conjunctiva/sclera: Conjunctivae normal.  Cardiovascular:     Rate and Rhythm: Normal rate and regular rhythm.  Pulmonary:     Effort: Pulmonary effort is normal.     Breath sounds: Normal breath sounds. No wheezing, rhonchi or rales.  Neurological:     Mental Status: He is alert.  Psychiatric:        Mood and Affect: Mood normal.        Behavior: Behavior normal.     Lab Results  Component Value Date   WBC 7.0 12/26/2023   HGB 14.1 12/26/2023   HCT 45.0 12/26/2023   PLT 271 12/26/2023   GLUCOSE 95 01/01/2024   CHOL 114 10/03/2023   TRIG 144 10/03/2023   HDL 33 (L) 10/03/2023   LDLCALC 52 10/03/2023   ALT 15 10/03/2023   AST 20 10/03/2023   NA 138 01/01/2024   K 5.6 (H) 01/01/2024   CL 104 01/01/2024   CREATININE 1.42 (H) 01/01/2024   BUN 23 01/01/2024   CO2 25 01/01/2024   INR 1.0 10/02/2023   HGBA1C 5.5 10/03/2023     Assessment & Plan:  Stage 3a chronic kidney disease (HCC) Assessment & Plan: Labs today to reassess. Continue Jardiance .  Orders: -     CBC -     CMP14+EGFR -     Microalbumin / creatinine urine ratio  Depression, major, single episode, moderate (HCC) Assessment & Plan: Stable. Celexa   refilled.  Orders: -     Citalopram  Hydrobromide; Take 1 tablet (20 mg total) by mouth at bedtime.  Dispense: 90 tablet;  Refill: 3  Hyperlipidemia, unspecified hyperlipidemia type Assessment & Plan: Lipid panel today to assess. Continue Crestor .  Orders: -     Lipid panel  Urothelial carcinoma of bladder (HCC)  Aneurysm of ascending aorta without rupture Assessment & Plan: Needs annual follow up imaging.   Coronary artery disease of native artery of native heart with stable angina pectoris Assessment & Plan: Stable.   Chronic obstructive pulmonary disease, unspecified COPD type (HCC) Assessment & Plan: Stable on Trelegy.     Follow-up:  6 months  Dezerae Freiberger Bluford DO [facility]

## 2024-06-25 NOTE — Assessment & Plan Note (Signed)
Stable. Celexa refilled. 

## 2024-06-25 NOTE — Assessment & Plan Note (Signed)
 Stable

## 2024-06-25 NOTE — Assessment & Plan Note (Signed)
Lipid panel today to assess.  Continue Crestor. 

## 2024-06-26 ENCOUNTER — Ambulatory Visit

## 2024-06-26 VITALS — BP 115/74 | Ht 69.0 in | Wt 169.0 lb

## 2024-06-26 DIAGNOSIS — Z0000 Encounter for general adult medical examination without abnormal findings: Secondary | ICD-10-CM

## 2024-06-26 LAB — CBC
Hematocrit: 47.3 % (ref 37.5–51.0)
Hemoglobin: 14.9 g/dL (ref 13.0–17.7)
MCH: 29.8 pg (ref 26.6–33.0)
MCHC: 31.5 g/dL (ref 31.5–35.7)
MCV: 95 fL (ref 79–97)
Platelets: 328 x10E3/uL (ref 150–450)
RBC: 5 x10E6/uL (ref 4.14–5.80)
RDW: 14.1 % (ref 11.6–15.4)
WBC: 7.3 x10E3/uL (ref 3.4–10.8)

## 2024-06-26 LAB — CMP14+EGFR
ALT: 32 IU/L (ref 0–44)
AST: 26 IU/L (ref 0–40)
Albumin: 4.9 g/dL (ref 3.9–4.9)
Alkaline Phosphatase: 81 IU/L (ref 47–123)
BUN/Creatinine Ratio: 15 (ref 10–24)
BUN: 22 mg/dL (ref 8–27)
Bilirubin Total: 0.4 mg/dL (ref 0.0–1.2)
CO2: 21 mmol/L (ref 20–29)
Calcium: 10.3 mg/dL — ABNORMAL HIGH (ref 8.6–10.2)
Chloride: 104 mmol/L (ref 96–106)
Creatinine, Ser: 1.5 mg/dL — ABNORMAL HIGH (ref 0.76–1.27)
Globulin, Total: 2.5 g/dL (ref 1.5–4.5)
Glucose: 91 mg/dL (ref 70–99)
Potassium: 6.1 mmol/L — ABNORMAL HIGH (ref 3.5–5.2)
Sodium: 140 mmol/L (ref 134–144)
Total Protein: 7.4 g/dL (ref 6.0–8.5)
eGFR: 51 mL/min/1.73 — ABNORMAL LOW

## 2024-06-26 LAB — LIPID PANEL
Chol/HDL Ratio: 3.1 ratio (ref 0.0–5.0)
Cholesterol, Total: 138 mg/dL (ref 100–199)
HDL: 44 mg/dL
LDL Chol Calc (NIH): 68 mg/dL (ref 0–99)
Triglycerides: 148 mg/dL (ref 0–149)
VLDL Cholesterol Cal: 26 mg/dL (ref 5–40)

## 2024-06-26 LAB — MICROALBUMIN / CREATININE URINE RATIO
Creatinine, Urine: 184 mg/dL
Microalb/Creat Ratio: 19 mg/g{creat} (ref 0–29)
Microalbumin, Urine: 35.4 ug/mL

## 2024-06-26 NOTE — Progress Notes (Signed)
 "  No voiced or noted concerns at this time  Chief Complaint  Patient presents with   Medicare Wellness     Subjective:   Michael Brady is a 68 y.o. male who presents for a Medicare Annual Wellness Visit.  Visit info / Clinical Intake: Medicare Wellness Visit Type:: Initial Annual Wellness Visit Persons participating in visit and providing information:: patient Medicare Wellness Visit Mode:: Telephone If telephone:: video declined Since this visit was completed virtually, some vitals may be partially provided or unavailable. Missing vitals are due to the limitations of the virtual format.: Documented vitals are patient reported If Telephone or Video please confirm:: I connected with patient using audio/video enable telemedicine. I verified patient identity with two identifiers, discussed telehealth limitations, and patient agreed to proceed. Patient Location:: home Provider Location:: office Interpreter Needed?: No Pre-visit prep was completed: yes AWV questionnaire completed by patient prior to visit?: no Living arrangements:: lives with spouse/significant other Patient's Overall Health Status Rating: good Typical amount of pain: none Does pain affect daily life?: no Are you currently prescribed opioids?: no  Dietary Habits and Nutritional Risks How many meals a day?: 2 Eats fruit and vegetables daily?: yes Most meals are obtained by: preparing own meals; having others provide food In the last 2 weeks, have you had any of the following?: none Diabetic:: no  Functional Status Activities of Daily Living (to include ambulation/medication): Independent Ambulation: Independent Medication Administration: Independent Home Management (perform basic housework or laundry): Independent Manage your own finances?: yes Primary transportation is: driving Concerns about vision?: no *vision screening is required for WTM* Concerns about hearing?: no  Fall Screening Falls in the past  year?: 0 Number of falls in past year: 0 Was there an injury with Fall?: 0 Fall Risk Category Calculator: 0 Patient Fall Risk Level: Low Fall Risk  Fall Risk Patient at Risk for Falls Due to: No Fall Risks Fall risk Follow up: Falls evaluation completed; Education provided; Falls prevention discussed  Home and Transportation Safety: All rugs have non-skid backing?: yes All stairs or steps have railings?: yes Grab bars in the bathtub or shower?: yes Have non-skid surface in bathtub or shower?: yes Good home lighting?: yes Regular seat belt use?: yes Hospital stays in the last year:: (!) yes How many hospital stays:: 1 Reason: stroke  Cognitive Assessment Difficulty concentrating, remembering, or making decisions? : no Will 6CIT or Mini Cog be Completed: yes What year is it?: 0 points What month is it?: 0 points Give patient an address phrase to remember (5 components): [address] [facility] [place] About what time is it?: 0 points Count backwards from 20 to 1: 0 points Say the months of the year in reverse: 0 points Repeat the address phrase from earlier: 0 points 6 CIT Score: 0 points  Advance Directives (For Healthcare) Does Patient Have a Medical Advance Directive?: No Would patient like information on creating a medical advance directive?: Yes (MAU/Ambulatory/Procedural Areas - Information given)  Reviewed/Updated  Reviewed/Updated: Reviewed All (Medical, Surgical, Family, Medications, Allergies, Care Teams, Patient Goals)    Allergies (verified) Patient has no known allergies.   Current Medications (verified) Outpatient Encounter Medications as of 06/26/2024  Medication Sig   acetaminophen  (TYLENOL ) 500 MG tablet Take 2 tablets (1,000 mg total) by mouth every 6 (six) hours.   aspirin  EC 81 MG tablet Take 1 tablet (81 mg total) by mouth daily. Swallow whole.   citalopram  (CELEXA ) 20 MG tablet Take 1 tablet (20 mg total) by mouth  at bedtime.   JARDIANCE  10 MG TABS  tablet TAKE 1 TABLET(10 MG) BY MOUTH DAILY BEFORE BREAKFAST   nicotine  (NICODERM CQ  - DOSED IN MG/24 HOURS) 21 mg/24hr patch Place 1 patch (21 mg total) onto the skin daily.   rosuvastatin  (CRESTOR ) 20 MG tablet TAKE 1 TABLET(20 MG) BY MOUTH DAILY   TRELEGY ELLIPTA  100-62.5-25 MCG/ACT AEPB INHALE 1 PUFF INTO THE LUNGS DAILY   valACYclovir  (VALTREX ) 1000 MG tablet Take 1 tablet (1,000 mg total) by mouth 3 (three) times daily for 10 days.   No facility-administered encounter medications on file as of 06/26/2024.    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 I JUST HAD MY BLOOD DRAWN AT [facility] THIS LAST MONDAY BUT THEY HAVENT CALLED ME ABOUT IT YET   History of closed dislocation of shoulder 06/2014   LEFT --- POST IV SEDATION CLOSED REDUCTION IN ED   PONV (postoperative nausea and vomiting)    Renal mass, left    LEFT RENAL COLLECTING SYSTEM MASS   Smokers' cough (HCC)    OCC   Stroke (HCC) 09/2023   Acute ischemic infarct- left eye stroke   Ulcer    Wears dentures    Wears glasses    Past Surgical History:  Procedure Laterality Date   BRONCHIAL BIOPSY  10/08/2023   Procedure: BRONCHOSCOPY, WITH BIOPSY;  Surgeon: Shelah Lamar RAMAN, MD;  Location: [facility];  Service: Pulmonary;;   BRONCHIAL BRUSHINGS  10/08/2023   Procedure: BRONCHOSCOPY, WITH BRUSH BIOPSY;  Surgeon: Shelah Lamar RAMAN, MD;  Location: [facility];  Service: Pulmonary;;   BRONCHIAL NEEDLE ASPIRATION BIOPSY  10/08/2023   Procedure: BRONCHOSCOPY, WITH NEEDLE ASPIRATION BIOPSY;  Surgeon: Shelah Lamar RAMAN, MD;  Location: [facility] END[facility];  Service: Pulmonary;;   CARPAL TUNNEL RELEASE Left 01-25-2006   dr barbarann  [facility]   and LEFT THUMB PULLEY RELEASE    COLONOSCOPY N/A 05/07/2017   Procedure: COLONOSCOPY;  Surgeon: Mavis Anes, MD;  [facility];  Service: Gastroenterology;  Laterality: N/A;   CYSTOSCOPY W/ RETROGRADES Right 07/25/2018   Procedure: CYSTOSCOPY WITH RETROGRADE PYELOGRAM;  Surgeon: Alvaro Hummer, MD;  Location: [facility];  Service: Urology;  Laterality: Right;   CYSTOSCOPY W/ RETROGRADES Right 01/01/2024   Procedure: CYSTOSCOPY, WITH RETROGRADE PYELOGRAM;  Surgeon: Alvaro Hummer KATHEE Mickey., MD;  Location: [facility];  Service: Urology;  Laterality: Right;   CYSTOSCOPY WITH URETEROSCOPY AND STENT PLACEMENT Left 07/29/2017   Procedure: CYSTOSCOPY WITH URETEROSCOPY AND STENT PLACEMENT;  Surgeon: Chauncey Redell Agent, MD;  Location: [facility];  Service: Urology;  Laterality: Left;   CYSTOSCOPY/URETEROSCOPY/HOLMIUM LASER/STENT PLACEMENT Left 07/15/2017   Procedure: CYSTOSCOPY, ATTEMTED URETEROSCOPY/,STENT PLACEMENT;  Surgeon: Chauncey Redell Agent, MD;  Location: [facility];  Service: Urology;  Laterality: Left;   CYSTOSCOPY/URETEROSCOPY/HOLMIUM LASER/STENT PLACEMENT Left 10/21/2017   Procedure: CYSTOSCOPY, RETROGRADE /URETEROSCOPY LEFT UPPER TRACT,BIOPSY, CORRIN LASER/STENT PLACEMENT;  Surgeon: Chauncey Redell Agent, MD;  Location: [facility];  Service: Urology;  Laterality: Left;   FUDUCIAL PLACEMENT  10/08/2023   Procedure: INSERTION, FIDUCIAL MARKER, GOLD;  Surgeon: Shelah Lamar RAMAN, MD;  Location: [facility];  Service: Pulmonary;;  GASTRORRHAPHY N/A 08/26/2021   Procedure: GASTRORRHAPHY;  Surgeon: Evonnie Dorothyann LABOR, DO;  Location: [facility];  Service: General;  Laterality: N/A;   HERNIA REPAIR     HOLMIUM LASER APPLICATION Left 10/21/2017   Procedure: HOLMIUM LASER APPLICATION;  Surgeon: Chauncey Redell Agent, MD;  Location: [facility];  Service: Urology;  Laterality: Left;   INCISIONAL HERNIA REPAIR Right 08/26/2021   Procedure: HERNIA  REPAIR INCISIONAL;  Surgeon: Evonnie Dorothyann LABOR, DO;  Location: [facility];  Service: General;  Laterality: Right;   LAPAROTOMY N/A 08/26/2021   Procedure: EXPLORATORY LAPAROTOMY;  Surgeon: Evonnie Dorothyann LABOR, DO;  Location: [facility];  Service: General;  Laterality: N/A;   ROBOT ASSITED LAPAROSCOPIC NEPHROURETERECTOMY Left 12/11/2017   Procedure: XI ROBOT ASSITED LAPAROSCOPIC NEPHROURETERECTOMY;  Surgeon: Alvaro Hummer, MD;  Location: [facility];  Service: Urology;  Laterality: Left;   SHOULDER SURGERY Right 1997   ROTATOR CUFF REPAIR    THULIUM LASER TURP (TRANSURETHRAL RESECTION OF PROSTATE) Left 07/29/2017   Procedure: CYSTOSCOPY, LEFT URETEROSCOPY WITH TUMOR BIOPSYTHULLIUM LASER ABLATION OF TUMOR AND LEFT URETERAL STENT EXCHANGE;  Surgeon: Chauncey Redell Agent, MD;  Location: [facility];  Service: Urology;  Laterality: Left;   TRANSURETHRAL RESECTION OF BLADDER TUMOR N/A 07/25/2018   Procedure: TRANSURETHRAL RESECTION OF BLADDER TUMOR (TURBT);  Surgeon: Alvaro Hummer, MD;  Location: [facility];  Service: Urology;  Laterality: N/A;   TRANSURETHRAL RESECTION OF BLADDER TUMOR N/A 01/01/2024   Procedure: TURBT (TRANSURETHRAL RESECTION OF BLADDER TUMOR);  Surgeon: Alvaro Hummer KATHEE Mickey., MD;  Location: [facility];  Service: Urology;  Laterality: N/A;   VIDEO BRONCHOSCOPY WITH ENDOBRONCHIAL NAVIGATION N/A 10/08/2023   Procedure: VIDEO BRONCHOSCOPY WITH ENDOBRONCHIAL NAVIGATION;  Surgeon: Shelah Lamar RAMAN, MD;  Location: [facility];  Service: Pulmonary;  Laterality: N/A;   WISDOM TOOTH EXTRACTION     Family History  Problem Relation Age of Onset   Colon cancer Neg Hx    Stroke Neg Hx    Social History   Occupational History   Not on file  Tobacco Use   Smoking status: Former    Current packs/day: 0.00    Average packs/day: 1.5 packs/day for 40.0 years (60.0 ttl pk-yrs)    Types: Cigarettes    Quit date: 10/06/2023    Years since quitting: 0.7   Smokeless  tobacco: Never  Vaping Use   Vaping status: Never Used  Substance and Sexual Activity   Alcohol use: No   Drug use: No   Sexual activity: Not on file   Tobacco Counseling Counseling given: Not Answered  SDOH Screenings   Food Insecurity: No Food Insecurity (06/26/2024)  Housing: Low Risk (06/26/2024)  Transportation Needs: No Transportation Needs (06/26/2024)  Utilities: Not At Risk (06/26/2024)  Depression (PHQ2-9): Low Risk (06/26/2024)  Financial Resource Strain: Low Risk (12/22/2023)  Physical Activity: Insufficiently Active (06/26/2024)  Social Connections: Socially Integrated (06/26/2024)  Stress: No Stress Concern Present (06/26/2024)  Tobacco Use: Medium Risk (06/26/2024)  Health Literacy: Adequate Health Literacy (06/26/2024)   See flowsheets for full screening details  Depression Screen PHQ 2 & 9 Depression Scale- Over the past 2 weeks, how often have you been bothered by any of the following problems? Little interest or pleasure in doing things: 0 Feeling down, depressed, or hopeless (PHQ Adolescent also includes...irritable): 0 PHQ-2 Total Score: 0 Trouble falling or staying asleep, or sleeping too much: 0 Feeling tired or having little energy: 0 Poor appetite or overeating (PHQ Adolescent also includes...weight loss): 0 Feeling  bad about yourself - or that you are a failure or have let yourself or your family down: 0 Trouble concentrating on things, such as reading the newspaper or watching television (PHQ Adolescent also includes...like school work): 0 Moving or speaking so slowly that other people could have noticed. Or the opposite - being so fidgety or restless that you have been moving around a lot more than usual: 0 Thoughts that you would be better off dead, or of hurting yourself in some way: 0 PHQ-9 Total Score: 0 If you checked off any problems, how difficult have these problems made it for you to do your work, take care of things at home, or get along  with other people?: Not difficult at all  Depression Treatment Depression Interventions/Treatment : [id number] Score <4 Follow-up Not Indicated     Goals Addressed             This Visit's Progress    I want to increase my physical activity.               Objective:    Today's Vitals   06/26/24 0929  BP: 115/74  Weight: 169 lb (76.7 kg)  Height: 5' 9 (1.753 m)   Body mass index is 24.96 kg/m.  Hearing/Vision screen Hearing Screening - Comments:: Patient denies any hearing difficulties.   Vision Screening - Comments:: Wears rx glasses - up to date with routine eye exams with  Medford Gaudy, MD Immunizations and Health Maintenance Health Maintenance  Topic Date Due   Medicare Annual Wellness (AWV)  Never done   Pneumococcal Vaccine: 50+ Years (1 of 2 - PCV) Never done   Zoster Vaccines- Shingrix (1 of 2) Never done   Influenza Vaccine  10/06/2024 (Originally 02/07/2024)   Lung Cancer Screening  12/17/2024   Colonoscopy  05/08/2027   DTaP/Tdap/Td (2 - Td or Tdap) 08/27/2028   Meningococcal B Vaccine  Aged Out   COVID-19 Vaccine  Discontinued   Hepatitis C Screening  Discontinued        Assessment/Plan:  This is a routine wellness examination for Michael Brady.  Patient Care Team: Cook, Jayce G, DO as PCP - General (Family Medicine) Gaudy Bruckner, MD as Consulting Physician (Ophthalmology) Alvaro Ricardo KATHEE Raddle., MD as Consulting Physician (Urology) Hunsucker, Donnice SAUNDERS, MD as Consulting Physician (Pulmonary Disease) Buck Rodena BIRCH, NP as Nurse Practitioner (Urology)  I have personally reviewed and noted the following in the patients chart:   Medical and social history Use of alcohol, tobacco or illicit drugs  Current medications and supplements including opioid prescriptions. Functional ability and status Nutritional status Physical activity Advanced directives List of other physicians Hospitalizations, surgeries, and ER visits in previous 12  months Vitals Screenings to include cognitive, depression, and falls Referrals and appointments  No orders of the defined types were placed in this encounter.  In addition, I have reviewed and discussed with patient certain preventive protocols, quality metrics, and best practice recommendations. A written personalized care plan for preventive services as well as general preventive health recommendations were provided to patient.   Avery Klingbeil, CMA   06/26/2024   Return on Thursday July 08, 2025 at 8:00 am, for your yearly Medicare Wellness Visit in person.  After Visit Summary: (MyChart) Due to this being a telephonic visit, the after visit summary with patients personalized plan was offered to patient via MyChart    "

## 2024-06-26 NOTE — Patient Instructions (Signed)
 Michael Brady,  Thank you for taking the time for your Medicare Wellness Visit. I appreciate your continued commitment to your health goals. Please review the care plan we discussed, and feel free to reach out if I can assist you further.  Please note that Annual Wellness Visits do not include a physical exam. Some assessments may be limited, especially if the visit was conducted virtually. If needed, we may recommend an in-person follow-up with your provider.  Ongoing Care Seeing your primary care provider every 3 to 6 months helps us  monitor your health and provide consistent, personalized care.   1 year follow up for Medicare well visit: Thursday July 08, 2025 at 8:00 am (wife at 8:40) with medicare wellness nurse in office  Referrals If a referral was made during today's visit and you haven't received any updates within two weeks, please contact the referred provider directly to check on the status.  No referrals placed today     Recommended Screenings:  Health Maintenance  Topic Date Due   Medicare Annual Wellness Visit  Never done   Pneumococcal Vaccine for age over 37 (1 of 2 - PCV) Never done   Zoster (Shingles) Vaccine (1 of 2) Never done   Flu Shot  10/06/2024*   Screening for Lung Cancer  12/17/2024   Colon Cancer Screening  05/08/2027   DTaP/Tdap/Td vaccine (2 - Td or Tdap) 08/27/2028   Meningitis B Vaccine  Aged Out   COVID-19 Vaccine  Discontinued   Hepatitis C Screening  Discontinued  *Topic was postponed. The date shown is not the original due date.       06/26/2024    9:41 AM  Advanced Directives  Does Patient Have a Medical Advance Directive? No  Would patient like information on creating a medical advance directive? Yes (MAU/Ambulatory/Procedural Areas - Information given)    Vision: Annual vision screenings are recommended for early detection of glaucoma, cataracts, and diabetic retinopathy. These exams can also reveal signs of chronic conditions such  as diabetes and high blood pressure.  Dental: Annual dental screenings help detect early signs of oral cancer, gum disease, and other conditions linked to overall health, including heart disease and diabetes.  Please see the attached documents for additional preventive care recommendations.

## 2024-06-28 ENCOUNTER — Other Ambulatory Visit (HOSPITAL_COMMUNITY)

## 2024-06-28 ENCOUNTER — Ambulatory Visit: Payer: Self-pay | Admitting: Family Medicine

## 2024-06-28 DIAGNOSIS — N1831 Chronic kidney disease, stage 3a: Secondary | ICD-10-CM

## 2024-06-28 MED ORDER — LOKELMA 10 G PO PACK: 10.0000 g | PACK | Freq: Three times a day (TID) | ORAL | 0 refills | Status: AC

## 2024-06-29 ENCOUNTER — Ambulatory Visit (HOSPITAL_COMMUNITY)
Admission: RE | Admit: 2024-06-29 | Discharge: 2024-06-29 | Disposition: A | Discharge status: Home / Self Care | Source: Ambulatory Visit | Attending: Pulmonary Disease | Admitting: Pulmonary Disease

## 2024-06-29 DIAGNOSIS — R918 Other nonspecific abnormal finding of lung field: Secondary | ICD-10-CM | POA: Diagnosis present

## 2024-06-30 ENCOUNTER — Other Ambulatory Visit: Payer: Self-pay | Admitting: Family Medicine

## 2024-08-12 ENCOUNTER — Other Ambulatory Visit: Payer: Self-pay | Admitting: Family Medicine

## 2024-08-12 DIAGNOSIS — J449 Chronic obstructive pulmonary disease, unspecified: Secondary | ICD-10-CM

## 2024-12-24 ENCOUNTER — Ambulatory Visit: Admitting: Family Medicine

## 2025-07-08 ENCOUNTER — Ambulatory Visit
